# Patient Record
Sex: Female | Born: 1940 | Race: White | Hispanic: No | Smoking: Former smoker
Health system: Southern US, Community
[De-identification: ages and names within clinical notes are randomized; demographics above are authoritative.]

## PROBLEM LIST (undated history)

## (undated) DIAGNOSIS — T50905A Adverse effect of unspecified drugs, medicaments and biological substances, initial encounter: Secondary | ICD-10-CM

## (undated) DIAGNOSIS — C569 Malignant neoplasm of unspecified ovary: Principal | ICD-10-CM

## (undated) DIAGNOSIS — I158 Other secondary hypertension: Secondary | ICD-10-CM

## (undated) DIAGNOSIS — C801 Malignant (primary) neoplasm, unspecified: Secondary | ICD-10-CM

## (undated) DIAGNOSIS — B029 Zoster without complications: Secondary | ICD-10-CM

## (undated) HISTORY — PX: GANGLION CYST EXCISION: SHX1691

## (undated) HISTORY — DX: Malignant (primary) neoplasm, unspecified: C80.1

## (undated) HISTORY — DX: Other secondary hypertension: I15.8

## (undated) HISTORY — DX: Malignant neoplasm of unspecified ovary: C56.9

## (undated) HISTORY — DX: Adverse effect of unspecified drugs, medicaments and biological substances, initial encounter: T50.905A

## (undated) HISTORY — DX: Zoster without complications: B02.9

---

## 2001-06-01 ENCOUNTER — Encounter: Payer: Self-pay | Admitting: Family Medicine

## 2001-06-01 ENCOUNTER — Ambulatory Visit (HOSPITAL_COMMUNITY): Admission: RE | Admit: 2001-06-01 | Discharge: 2001-06-01 | Payer: Self-pay | Admitting: Family Medicine

## 2001-06-07 ENCOUNTER — Encounter: Payer: Self-pay | Admitting: Family Medicine

## 2001-06-07 ENCOUNTER — Ambulatory Visit (HOSPITAL_COMMUNITY): Admission: RE | Admit: 2001-06-07 | Discharge: 2001-06-07 | Payer: Self-pay | Admitting: Family Medicine

## 2001-06-14 ENCOUNTER — Ambulatory Visit: Admission: RE | Admit: 2001-06-14 | Discharge: 2001-06-14 | Payer: Self-pay | Admitting: Gynecology

## 2001-06-16 ENCOUNTER — Ambulatory Visit (HOSPITAL_COMMUNITY): Admission: RE | Admit: 2001-06-16 | Discharge: 2001-06-16 | Payer: Self-pay | Admitting: Obstetrics and Gynecology

## 2001-06-16 ENCOUNTER — Encounter: Payer: Self-pay | Admitting: Obstetrics and Gynecology

## 2001-06-16 ENCOUNTER — Encounter: Payer: Self-pay | Admitting: Gynecology

## 2001-06-20 ENCOUNTER — Inpatient Hospital Stay (HOSPITAL_COMMUNITY): Admission: RE | Admit: 2001-06-20 | Discharge: 2001-06-23 | Payer: Self-pay | Admitting: Obstetrics and Gynecology

## 2001-06-20 ENCOUNTER — Encounter (INDEPENDENT_AMBULATORY_CARE_PROVIDER_SITE_OTHER): Payer: Self-pay

## 2001-08-01 ENCOUNTER — Ambulatory Visit: Admission: RE | Admit: 2001-08-01 | Discharge: 2001-08-01 | Payer: Self-pay | Admitting: Gynecologic Oncology

## 2001-11-07 ENCOUNTER — Ambulatory Visit (HOSPITAL_COMMUNITY): Admission: RE | Admit: 2001-11-07 | Discharge: 2001-11-07 | Payer: Self-pay | Admitting: Oncology

## 2001-11-07 ENCOUNTER — Encounter: Payer: Self-pay | Admitting: Oncology

## 2001-11-09 ENCOUNTER — Ambulatory Visit (HOSPITAL_COMMUNITY): Admission: RE | Admit: 2001-11-09 | Discharge: 2001-11-09 | Payer: Self-pay | Admitting: Oncology

## 2001-11-10 ENCOUNTER — Encounter (HOSPITAL_COMMUNITY): Admission: RE | Admit: 2001-11-10 | Discharge: 2001-11-18 | Payer: Self-pay | Admitting: Oncology

## 2001-11-28 ENCOUNTER — Ambulatory Visit: Admission: RE | Admit: 2001-11-28 | Discharge: 2001-11-28 | Payer: Self-pay | Admitting: Gynecologic Oncology

## 2002-01-16 ENCOUNTER — Encounter: Payer: Self-pay | Admitting: *Deleted

## 2002-01-16 ENCOUNTER — Ambulatory Visit (HOSPITAL_COMMUNITY): Admission: RE | Admit: 2002-01-16 | Discharge: 2002-01-16 | Payer: Self-pay | Admitting: *Deleted

## 2002-02-06 ENCOUNTER — Ambulatory Visit: Admission: RE | Admit: 2002-02-06 | Discharge: 2002-02-06 | Payer: Self-pay | Admitting: Gynecologic Oncology

## 2002-05-08 ENCOUNTER — Ambulatory Visit (HOSPITAL_COMMUNITY): Admission: RE | Admit: 2002-05-08 | Discharge: 2002-05-08 | Payer: Self-pay | Admitting: *Deleted

## 2002-05-08 ENCOUNTER — Encounter: Payer: Self-pay | Admitting: *Deleted

## 2002-05-15 ENCOUNTER — Ambulatory Visit: Admission: RE | Admit: 2002-05-15 | Discharge: 2002-05-15 | Payer: Self-pay | Admitting: Gynecologic Oncology

## 2002-09-19 ENCOUNTER — Ambulatory Visit: Admission: RE | Admit: 2002-09-19 | Discharge: 2002-09-19 | Payer: Self-pay | Admitting: Gynecologic Oncology

## 2003-02-05 ENCOUNTER — Ambulatory Visit: Admission: RE | Admit: 2003-02-05 | Discharge: 2003-02-05 | Payer: Self-pay | Admitting: Gynecologic Oncology

## 2003-02-13 ENCOUNTER — Ambulatory Visit (HOSPITAL_COMMUNITY): Admission: RE | Admit: 2003-02-13 | Discharge: 2003-02-13 | Payer: Self-pay | Admitting: Gynecologic Oncology

## 2003-02-13 ENCOUNTER — Encounter: Payer: Self-pay | Admitting: Gynecologic Oncology

## 2003-04-09 ENCOUNTER — Ambulatory Visit: Admission: RE | Admit: 2003-04-09 | Discharge: 2003-04-09 | Payer: Self-pay | Admitting: Gynecologic Oncology

## 2003-05-23 ENCOUNTER — Ambulatory Visit (HOSPITAL_COMMUNITY): Admission: RE | Admit: 2003-05-23 | Discharge: 2003-05-23 | Payer: Self-pay | Admitting: Oncology

## 2003-05-23 ENCOUNTER — Encounter: Payer: Self-pay | Admitting: Oncology

## 2003-09-17 ENCOUNTER — Ambulatory Visit: Admission: RE | Admit: 2003-09-17 | Discharge: 2003-09-17 | Payer: Self-pay | Admitting: Gynecologic Oncology

## 2004-03-24 ENCOUNTER — Ambulatory Visit: Admission: RE | Admit: 2004-03-24 | Discharge: 2004-03-24 | Payer: Self-pay | Admitting: Gynecologic Oncology

## 2004-04-29 ENCOUNTER — Ambulatory Visit (HOSPITAL_COMMUNITY): Admission: RE | Admit: 2004-04-29 | Discharge: 2004-04-29 | Payer: Self-pay | Admitting: Oncology

## 2004-08-25 ENCOUNTER — Ambulatory Visit: Admission: RE | Admit: 2004-08-25 | Discharge: 2004-08-25 | Payer: Self-pay | Admitting: Gynecologic Oncology

## 2004-11-23 ENCOUNTER — Ambulatory Visit (HOSPITAL_COMMUNITY): Admission: RE | Admit: 2004-11-23 | Discharge: 2004-11-23 | Payer: Self-pay | Admitting: Family Medicine

## 2004-12-02 ENCOUNTER — Ambulatory Visit (HOSPITAL_COMMUNITY): Admission: RE | Admit: 2004-12-02 | Discharge: 2004-12-02 | Payer: Self-pay | Admitting: Oncology

## 2004-12-02 ENCOUNTER — Encounter: Payer: Self-pay | Admitting: Cardiovascular Disease

## 2004-12-18 ENCOUNTER — Ambulatory Visit: Payer: Self-pay | Admitting: Oncology

## 2004-12-23 ENCOUNTER — Encounter: Admission: RE | Admit: 2004-12-23 | Discharge: 2004-12-23 | Payer: Self-pay | Admitting: Family Medicine

## 2004-12-30 ENCOUNTER — Encounter: Admission: RE | Admit: 2004-12-30 | Discharge: 2004-12-30 | Payer: Self-pay | Admitting: Family Medicine

## 2005-02-17 ENCOUNTER — Ambulatory Visit: Payer: Self-pay | Admitting: Oncology

## 2005-04-05 ENCOUNTER — Ambulatory Visit: Payer: Self-pay | Admitting: Oncology

## 2005-05-21 ENCOUNTER — Ambulatory Visit: Payer: Self-pay | Admitting: Oncology

## 2005-10-18 ENCOUNTER — Ambulatory Visit: Payer: Self-pay | Admitting: Oncology

## 2006-02-09 ENCOUNTER — Ambulatory Visit: Admission: RE | Admit: 2006-02-09 | Discharge: 2006-02-09 | Payer: Self-pay | Admitting: Gynecologic Oncology

## 2006-04-21 ENCOUNTER — Ambulatory Visit: Payer: Self-pay | Admitting: Oncology

## 2006-04-25 LAB — CBC WITH DIFFERENTIAL/PLATELET
Basophils Absolute: 0 10*3/uL (ref 0.0–0.1)
EOS%: 0.4 % (ref 0.0–7.0)
HCT: 39.1 % (ref 34.8–46.6)
HGB: 13.4 g/dL (ref 11.6–15.9)
MCH: 31.5 pg (ref 26.0–34.0)
MCV: 92 fL (ref 81.0–101.0)
NEUT%: 58.5 % (ref 39.6–76.8)
lymph#: 1.8 10*3/uL (ref 0.9–3.3)

## 2006-04-25 LAB — COMPREHENSIVE METABOLIC PANEL
Albumin: 4.3 g/dL (ref 3.5–5.2)
Alkaline Phosphatase: 73 U/L (ref 39–117)
BUN: 13 mg/dL (ref 6–23)
CO2: 26 mEq/L (ref 19–32)
Calcium: 9.1 mg/dL (ref 8.4–10.5)
Glucose, Bld: 87 mg/dL (ref 70–99)
Potassium: 4 mEq/L (ref 3.5–5.3)

## 2006-05-10 ENCOUNTER — Ambulatory Visit (HOSPITAL_COMMUNITY): Admission: RE | Admit: 2006-05-10 | Discharge: 2006-05-10 | Payer: Self-pay | Admitting: Oncology

## 2006-05-17 ENCOUNTER — Ambulatory Visit: Admission: RE | Admit: 2006-05-17 | Discharge: 2006-05-17 | Payer: Self-pay | Admitting: Gynecologic Oncology

## 2006-05-27 LAB — COMPREHENSIVE METABOLIC PANEL
AST: 12 U/L (ref 0–37)
Albumin: 4.2 g/dL (ref 3.5–5.2)
BUN: 17 mg/dL (ref 6–23)
Calcium: 8.9 mg/dL (ref 8.4–10.5)
Chloride: 106 mEq/L (ref 96–112)
Glucose, Bld: 87 mg/dL (ref 70–99)
Potassium: 3.7 mEq/L (ref 3.5–5.3)
Sodium: 140 mEq/L (ref 135–145)
Total Protein: 6.6 g/dL (ref 6.0–8.3)

## 2006-05-27 LAB — CBC WITH DIFFERENTIAL/PLATELET
BASO%: 0.6 % (ref 0.0–2.0)
Basophils Absolute: 0 10*3/uL (ref 0.0–0.1)
EOS%: 0.4 % (ref 0.0–7.0)
HGB: 13.2 g/dL (ref 11.6–15.9)
MCH: 30.8 pg (ref 26.0–34.0)
MCHC: 33.1 g/dL (ref 32.0–36.0)
RBC: 4.29 10*6/uL (ref 3.70–5.32)
RDW: 12.6 % (ref 11.3–14.5)
lymph#: 1.9 10*3/uL (ref 0.9–3.3)

## 2006-06-03 LAB — COMPREHENSIVE METABOLIC PANEL
AST: 13 U/L (ref 0–37)
Albumin: 4.3 g/dL (ref 3.5–5.2)
Alkaline Phosphatase: 69 U/L (ref 39–117)
BUN: 17 mg/dL (ref 6–23)
Calcium: 9.3 mg/dL (ref 8.4–10.5)
Glucose, Bld: 126 mg/dL — ABNORMAL HIGH (ref 70–99)
Total Bilirubin: 0.4 mg/dL (ref 0.3–1.2)

## 2006-06-03 LAB — CA 125: CA 125: 89.8 U/mL — ABNORMAL HIGH (ref 0.0–30.2)

## 2006-06-03 LAB — CBC WITH DIFFERENTIAL/PLATELET
Basophils Absolute: 0.1 10*3/uL (ref 0.0–0.1)
Eosinophils Absolute: 0 10*3/uL (ref 0.0–0.5)
HGB: 13.3 g/dL (ref 11.6–15.9)
MONO#: 0.2 10*3/uL (ref 0.1–0.9)
NEUT#: 3 10*3/uL (ref 1.5–6.5)
RBC: 4.27 10*6/uL (ref 3.70–5.32)
RDW: 12.1 % (ref 11.3–14.5)
WBC: 5.3 10*3/uL (ref 3.9–10.0)
lymph#: 1.9 10*3/uL (ref 0.9–3.3)

## 2006-06-08 ENCOUNTER — Ambulatory Visit: Payer: Self-pay | Admitting: Oncology

## 2006-06-10 LAB — CBC WITH DIFFERENTIAL/PLATELET
BASO%: 1 % (ref 0.0–2.0)
EOS%: 0.6 % (ref 0.0–7.0)
MCH: 32.3 pg (ref 26.0–34.0)
MCHC: 34.8 g/dL (ref 32.0–36.0)
MCV: 92.7 fL (ref 81.0–101.0)
MONO%: 4.9 % (ref 0.0–13.0)
RDW: 12.7 % (ref 11.3–14.5)
lymph#: 1.5 10*3/uL (ref 0.9–3.3)

## 2006-06-10 LAB — COMPREHENSIVE METABOLIC PANEL
ALT: 11 U/L (ref 0–40)
AST: 14 U/L (ref 0–37)
Albumin: 4.3 g/dL (ref 3.5–5.2)
Alkaline Phosphatase: 73 U/L (ref 39–117)
Calcium: 8.6 mg/dL (ref 8.4–10.5)
Chloride: 108 mEq/L (ref 96–112)
Creatinine, Ser: 0.8 mg/dL (ref 0.40–1.20)
Potassium: 3.7 mEq/L (ref 3.5–5.3)

## 2006-06-17 LAB — CBC WITH DIFFERENTIAL/PLATELET
Basophils Absolute: 0 10*3/uL (ref 0.0–0.1)
HCT: 32.3 % — ABNORMAL LOW (ref 34.8–46.6)
HGB: 11.1 g/dL — ABNORMAL LOW (ref 11.6–15.9)
MONO#: 0.3 10*3/uL (ref 0.1–0.9)
NEUT%: 38.5 % — ABNORMAL LOW (ref 39.6–76.8)
WBC: 3.1 10*3/uL — ABNORMAL LOW (ref 3.9–10.0)
lymph#: 1.6 10*3/uL (ref 0.9–3.3)

## 2006-06-24 LAB — CBC WITH DIFFERENTIAL/PLATELET
BASO%: 0.9 % (ref 0.0–2.0)
EOS%: 0.3 % (ref 0.0–7.0)
HCT: 36.5 % (ref 34.8–46.6)
LYMPH%: 37.6 % (ref 14.0–48.0)
MCH: 31.2 pg (ref 26.0–34.0)
MCHC: 33.7 g/dL (ref 32.0–36.0)
MONO%: 9.3 % (ref 0.0–13.0)
NEUT%: 52 % (ref 39.6–76.8)
Platelets: 138 10*3/uL — ABNORMAL LOW (ref 145–400)

## 2006-06-24 LAB — COMPREHENSIVE METABOLIC PANEL
ALT: 15 U/L (ref 0–40)
AST: 13 U/L (ref 0–37)
Creatinine, Ser: 0.68 mg/dL (ref 0.40–1.20)
Total Bilirubin: 0.4 mg/dL (ref 0.3–1.2)

## 2006-06-24 LAB — CA 125: CA 125: 71.8 U/mL — ABNORMAL HIGH (ref 0.0–30.2)

## 2006-07-01 LAB — COMPREHENSIVE METABOLIC PANEL
AST: 11 U/L (ref 0–37)
BUN: 18 mg/dL (ref 6–23)
Calcium: 9 mg/dL (ref 8.4–10.5)
Chloride: 107 mEq/L (ref 96–112)
Creatinine, Ser: 0.66 mg/dL (ref 0.40–1.20)
Glucose, Bld: 98 mg/dL (ref 70–99)

## 2006-07-01 LAB — CBC WITH DIFFERENTIAL/PLATELET
Basophils Absolute: 0.1 10*3/uL (ref 0.0–0.1)
EOS%: 0.6 % (ref 0.0–7.0)
HCT: 34 % — ABNORMAL LOW (ref 34.8–46.6)
HGB: 12 g/dL (ref 11.6–15.9)
MCH: 32.6 pg (ref 26.0–34.0)
MCV: 92.1 fL (ref 81.0–101.0)
NEUT%: 62.5 % (ref 39.6–76.8)
lymph#: 1.5 10*3/uL (ref 0.9–3.3)

## 2006-07-01 LAB — CA 125: CA 125: 71.2 U/mL — ABNORMAL HIGH (ref 0.0–30.2)

## 2006-07-08 LAB — CBC WITH DIFFERENTIAL/PLATELET
BASO%: 1.5 % (ref 0.0–2.0)
EOS%: 0.5 % (ref 0.0–7.0)
HGB: 11 g/dL — ABNORMAL LOW (ref 11.6–15.9)
MCH: 32.3 pg (ref 26.0–34.0)
MCHC: 34.8 g/dL (ref 32.0–36.0)
RBC: 3.4 10*6/uL — ABNORMAL LOW (ref 3.70–5.32)
RDW: 14.5 % (ref 11.3–14.5)
lymph#: 1.2 10*3/uL (ref 0.9–3.3)

## 2006-07-08 LAB — COMPREHENSIVE METABOLIC PANEL
AST: 10 U/L (ref 0–37)
Albumin: 4 g/dL (ref 3.5–5.2)
Alkaline Phosphatase: 55 U/L (ref 39–117)
Potassium: 3.8 mEq/L (ref 3.5–5.3)
Sodium: 139 mEq/L (ref 135–145)
Total Bilirubin: 0.6 mg/dL (ref 0.3–1.2)
Total Protein: 6.2 g/dL (ref 6.0–8.3)

## 2006-07-08 LAB — CA 125: CA 125: 55.8 U/mL — ABNORMAL HIGH (ref 0.0–30.2)

## 2006-07-15 LAB — COMPREHENSIVE METABOLIC PANEL
ALT: 12 U/L (ref 0–40)
AST: 14 U/L (ref 0–37)
Creatinine, Ser: 0.86 mg/dL (ref 0.40–1.20)
Total Bilirubin: 0.5 mg/dL (ref 0.3–1.2)

## 2006-07-15 LAB — CBC WITH DIFFERENTIAL/PLATELET
Basophils Absolute: 0 10*3/uL (ref 0.0–0.1)
EOS%: 0.2 % (ref 0.0–7.0)
LYMPH%: 12.7 % — ABNORMAL LOW (ref 14.0–48.0)
MCH: 33.2 pg (ref 26.0–34.0)
MCV: 95.1 fL (ref 81.0–101.0)
MONO%: 4.6 % (ref 0.0–13.0)
Platelets: 81 10*3/uL — ABNORMAL LOW (ref 145–400)
RBC: 2.91 10*6/uL — ABNORMAL LOW (ref 3.70–5.32)
RDW: 18.9 % — ABNORMAL HIGH (ref 11.3–14.5)

## 2006-07-15 LAB — CA 125: CA 125: 32.3 U/mL — ABNORMAL HIGH (ref 0.0–30.2)

## 2006-07-22 ENCOUNTER — Ambulatory Visit: Payer: Self-pay | Admitting: Oncology

## 2006-07-22 LAB — CBC WITH DIFFERENTIAL/PLATELET
BASO%: 0.9 % (ref 0.0–2.0)
Basophils Absolute: 0.1 10*3/uL (ref 0.0–0.1)
EOS%: 0.1 % (ref 0.0–7.0)
MCH: 32.8 pg (ref 26.0–34.0)
MCHC: 33.1 g/dL (ref 32.0–36.0)
MCV: 99.1 fL (ref 81.0–101.0)
MONO%: 6.5 % (ref 0.0–13.0)
RBC: 3.21 10*6/uL — ABNORMAL LOW (ref 3.70–5.32)
RDW: 20.2 % — ABNORMAL HIGH (ref 11.3–14.5)

## 2006-07-29 LAB — COMPREHENSIVE METABOLIC PANEL
AST: 13 U/L (ref 0–37)
Albumin: 4.2 g/dL (ref 3.5–5.2)
Alkaline Phosphatase: 73 U/L (ref 39–117)
Glucose, Bld: 107 mg/dL — ABNORMAL HIGH (ref 70–99)
Potassium: 3.9 mEq/L (ref 3.5–5.3)
Sodium: 139 mEq/L (ref 135–145)
Total Protein: 6.7 g/dL (ref 6.0–8.3)

## 2006-07-29 LAB — CBC WITH DIFFERENTIAL/PLATELET
EOS%: 0.3 % (ref 0.0–7.0)
Eosinophils Absolute: 0 10*3/uL (ref 0.0–0.5)
MCV: 98.3 fL (ref 81.0–101.0)
MONO%: 6.1 % (ref 0.0–13.0)
NEUT#: 3 10*3/uL (ref 1.5–6.5)
RBC: 3.38 10*6/uL — ABNORMAL LOW (ref 3.70–5.32)
RDW: 18.1 % — ABNORMAL HIGH (ref 11.3–14.5)
WBC: 4.7 10*3/uL (ref 3.9–10.0)
lymph#: 1.4 10*3/uL (ref 0.9–3.3)

## 2006-08-05 LAB — COMPREHENSIVE METABOLIC PANEL
ALT: 15 U/L (ref 0–40)
AST: 13 U/L (ref 0–37)
CO2: 24 mEq/L (ref 19–32)
Creatinine, Ser: 0.69 mg/dL (ref 0.40–1.20)
Total Bilirubin: 0.5 mg/dL (ref 0.3–1.2)

## 2006-08-05 LAB — CBC WITH DIFFERENTIAL/PLATELET
BASO%: 0.8 % (ref 0.0–2.0)
EOS%: 0.4 % (ref 0.0–7.0)
HCT: 35.6 % (ref 34.8–46.6)
LYMPH%: 30 % (ref 14.0–48.0)
MCH: 33.3 pg (ref 26.0–34.0)
MCHC: 33.4 g/dL (ref 32.0–36.0)
MCV: 99.7 fL (ref 81.0–101.0)
MONO%: 7.6 % (ref 0.0–13.0)
NEUT%: 61.2 % (ref 39.6–76.8)
Platelets: 207 10*3/uL (ref 145–400)

## 2006-08-05 LAB — CA 125: CA 125: 34.4 U/mL — ABNORMAL HIGH (ref 0.0–30.2)

## 2006-08-12 LAB — CBC WITH DIFFERENTIAL/PLATELET
Basophils Absolute: 0.3 10*3/uL — ABNORMAL HIGH (ref 0.0–0.1)
EOS%: 0.2 % (ref 0.0–7.0)
HCT: 34.5 % — ABNORMAL LOW (ref 34.8–46.6)
HGB: 11.7 g/dL (ref 11.6–15.9)
MCH: 33.7 pg (ref 26.0–34.0)
NEUT%: 68.9 % (ref 39.6–76.8)
lymph#: 2.5 10*3/uL (ref 0.9–3.3)

## 2006-08-19 LAB — CA 125: CA 125: 31.8 U/mL — ABNORMAL HIGH (ref 0.0–30.2)

## 2006-08-19 LAB — CBC WITH DIFFERENTIAL/PLATELET
Basophils Absolute: 0.1 10*3/uL (ref 0.0–0.1)
EOS%: 0.4 % (ref 0.0–7.0)
HCT: 33.5 % — ABNORMAL LOW (ref 34.8–46.6)
HGB: 11.6 g/dL (ref 11.6–15.9)
LYMPH%: 21.2 % (ref 14.0–48.0)
MCH: 34.7 pg — ABNORMAL HIGH (ref 26.0–34.0)
MCV: 100 fL (ref 81.0–101.0)
MONO%: 6.3 % (ref 0.0–13.0)
NEUT%: 71.2 % (ref 39.6–76.8)
Platelets: 142 10*3/uL — ABNORMAL LOW (ref 145–400)
RDW: 17.1 % — ABNORMAL HIGH (ref 11.3–14.5)

## 2006-08-19 LAB — COMPREHENSIVE METABOLIC PANEL
CO2: 22 mEq/L (ref 19–32)
Glucose, Bld: 98 mg/dL (ref 70–99)
Sodium: 140 mEq/L (ref 135–145)
Total Bilirubin: 0.4 mg/dL (ref 0.3–1.2)
Total Protein: 6.8 g/dL (ref 6.0–8.3)

## 2006-09-07 ENCOUNTER — Ambulatory Visit: Payer: Self-pay | Admitting: Oncology

## 2006-09-09 ENCOUNTER — Ambulatory Visit: Admission: RE | Admit: 2006-09-09 | Discharge: 2006-09-09 | Payer: Self-pay | Admitting: Oncology

## 2006-09-09 ENCOUNTER — Encounter: Payer: Self-pay | Admitting: Vascular Surgery

## 2006-09-09 LAB — CBC WITH DIFFERENTIAL/PLATELET
BASO%: 0.7 % (ref 0.0–2.0)
HCT: 37.5 % (ref 34.8–46.6)
MCHC: 33.6 g/dL (ref 32.0–36.0)
MONO#: 0.3 10*3/uL (ref 0.1–0.9)
NEUT%: 67.3 % (ref 39.6–76.8)
RBC: 3.8 10*6/uL (ref 3.70–5.32)
WBC: 5.5 10*3/uL (ref 3.9–10.0)
lymph#: 1.4 10*3/uL (ref 0.9–3.3)

## 2006-09-15 LAB — CBC WITH DIFFERENTIAL/PLATELET
BASO%: 0.4 % (ref 0.0–2.0)
Eosinophils Absolute: 0 10*3/uL (ref 0.0–0.5)
HCT: 36.7 % (ref 34.8–46.6)
LYMPH%: 28.4 % (ref 14.0–48.0)
MCHC: 33.9 g/dL (ref 32.0–36.0)
MCV: 99.2 fL (ref 81.0–101.0)
MONO#: 0.2 10*3/uL (ref 0.1–0.9)
NEUT%: 66.9 % (ref 39.6–76.8)
Platelets: 137 10*3/uL — ABNORMAL LOW (ref 145–400)
WBC: 4.4 10*3/uL (ref 3.9–10.0)

## 2006-09-15 LAB — COMPREHENSIVE METABOLIC PANEL
ALT: 17 U/L (ref 0–40)
Albumin: 4.1 g/dL (ref 3.5–5.2)
CO2: 25 mEq/L (ref 19–32)
Calcium: 9.1 mg/dL (ref 8.4–10.5)
Chloride: 105 mEq/L (ref 96–112)
Potassium: 3.8 mEq/L (ref 3.5–5.3)
Sodium: 140 mEq/L (ref 135–145)
Total Protein: 6.7 g/dL (ref 6.0–8.3)

## 2006-09-15 LAB — CA 125: CA 125: 29.1 U/mL (ref 0.0–30.2)

## 2006-09-30 LAB — CBC WITH DIFFERENTIAL/PLATELET
Basophils Absolute: 0 10*3/uL (ref 0.0–0.1)
EOS%: 0.9 % (ref 0.0–7.0)
NEUT#: 1.9 10*3/uL (ref 1.5–6.5)
NEUT%: 49.3 % (ref 39.6–76.8)
Platelets: 163 10*3/uL (ref 145–400)
WBC: 3.9 10*3/uL (ref 3.9–10.0)
lymph#: 1.5 10*3/uL (ref 0.9–3.3)

## 2006-09-30 LAB — COMPREHENSIVE METABOLIC PANEL
ALT: 15 U/L (ref 0–40)
AST: 11 U/L (ref 0–37)
Albumin: 3.7 g/dL (ref 3.5–5.2)
Alkaline Phosphatase: 74 U/L (ref 39–117)
Calcium: 8.7 mg/dL (ref 8.4–10.5)
Chloride: 105 mEq/L (ref 96–112)
Potassium: 3.4 mEq/L — ABNORMAL LOW (ref 3.5–5.3)
Sodium: 144 mEq/L (ref 135–145)
Total Protein: 6.3 g/dL (ref 6.0–8.3)

## 2006-10-21 LAB — COMPREHENSIVE METABOLIC PANEL
AST: 12 U/L (ref 0–37)
Albumin: 4.2 g/dL (ref 3.5–5.2)
Alkaline Phosphatase: 75 U/L (ref 39–117)
Potassium: 4.1 mEq/L (ref 3.5–5.3)
Sodium: 139 mEq/L (ref 135–145)
Total Bilirubin: 0.4 mg/dL (ref 0.3–1.2)
Total Protein: 6.1 g/dL (ref 6.0–8.3)

## 2006-10-21 LAB — CBC WITH DIFFERENTIAL/PLATELET
Eosinophils Absolute: 0 10*3/uL (ref 0.0–0.5)
LYMPH%: 30.7 % (ref 14.0–48.0)
MONO#: 0.3 10*3/uL (ref 0.1–0.9)
NEUT#: 3.5 10*3/uL (ref 1.5–6.5)
Platelets: 126 10*3/uL — ABNORMAL LOW (ref 145–400)
RBC: 3.25 10*6/uL — ABNORMAL LOW (ref 3.70–5.32)
RDW: 14.4 % (ref 11.3–14.5)
WBC: 5.7 10*3/uL (ref 3.9–10.0)

## 2006-10-26 ENCOUNTER — Ambulatory Visit: Payer: Self-pay | Admitting: Oncology

## 2006-10-28 LAB — CBC WITH DIFFERENTIAL/PLATELET
Basophils Absolute: 0.1 10*3/uL (ref 0.0–0.1)
Eosinophils Absolute: 0 10*3/uL (ref 0.0–0.5)
HGB: 10.7 g/dL — ABNORMAL LOW (ref 11.6–15.9)
MCV: 93.3 fL (ref 81.0–101.0)
MONO#: 0.4 10*3/uL (ref 0.1–0.9)
NEUT#: 2 10*3/uL (ref 1.5–6.5)
RBC: 3.27 10*6/uL — ABNORMAL LOW (ref 3.70–5.32)
RDW: 15.1 % — ABNORMAL HIGH (ref 11.3–14.5)
WBC: 4 10*3/uL (ref 3.9–10.0)

## 2006-10-28 LAB — COMPREHENSIVE METABOLIC PANEL
AST: 12 U/L (ref 0–37)
Albumin: 4.1 g/dL (ref 3.5–5.2)
Alkaline Phosphatase: 76 U/L (ref 39–117)
BUN: 17 mg/dL (ref 6–23)
Calcium: 8.8 mg/dL (ref 8.4–10.5)
Chloride: 106 mEq/L (ref 96–112)
Creatinine, Ser: 0.65 mg/dL (ref 0.40–1.20)
Glucose, Bld: 109 mg/dL — ABNORMAL HIGH (ref 70–99)
Potassium: 3.7 mEq/L (ref 3.5–5.3)

## 2006-11-04 LAB — COMPREHENSIVE METABOLIC PANEL
ALT: 15 U/L (ref 0–35)
AST: 14 U/L (ref 0–37)
Alkaline Phosphatase: 115 U/L (ref 39–117)
Calcium: 9.1 mg/dL (ref 8.4–10.5)
Chloride: 105 mEq/L (ref 96–112)
Creatinine, Ser: 0.73 mg/dL (ref 0.40–1.20)
Potassium: 3.7 mEq/L (ref 3.5–5.3)

## 2006-11-04 LAB — CBC WITH DIFFERENTIAL/PLATELET
BASO%: 0.1 % (ref 0.0–2.0)
EOS%: 0.1 % (ref 0.0–7.0)
MCH: 33.3 pg (ref 26.0–34.0)
MCHC: 34.1 g/dL (ref 32.0–36.0)
MONO%: 6.7 % (ref 0.0–13.0)
NEUT%: 82.3 % — ABNORMAL HIGH (ref 39.6–76.8)
RDW: 19 % — ABNORMAL HIGH (ref 11.3–14.5)
lymph#: 1.5 10*3/uL (ref 0.9–3.3)

## 2006-11-11 LAB — CBC WITH DIFFERENTIAL/PLATELET
Basophils Absolute: 0 10*3/uL (ref 0.0–0.1)
EOS%: 0.2 % (ref 0.0–7.0)
Eosinophils Absolute: 0 10*3/uL (ref 0.0–0.5)
HGB: 11.4 g/dL — ABNORMAL LOW (ref 11.6–15.9)
LYMPH%: 16.4 % (ref 14.0–48.0)
MCH: 32.3 pg (ref 26.0–34.0)
MCV: 97.5 fL (ref 81.0–101.0)
MONO%: 6.4 % (ref 0.0–13.0)
NEUT%: 76.7 % (ref 39.6–76.8)
Platelets: 74 10*3/uL — ABNORMAL LOW (ref 145–400)
RDW: 18.6 % — ABNORMAL HIGH (ref 11.3–14.5)

## 2006-11-18 LAB — CBC WITH DIFFERENTIAL/PLATELET
EOS%: 0.4 % (ref 0.0–7.0)
LYMPH%: 28.8 % (ref 14.0–48.0)
MCH: 33.7 pg (ref 26.0–34.0)
MCHC: 34.5 g/dL (ref 32.0–36.0)
MCV: 97.7 fL (ref 81.0–101.0)
MONO%: 10.3 % (ref 0.0–13.0)
NEUT#: 3.4 10*3/uL (ref 1.5–6.5)
Platelets: 82 10*3/uL — ABNORMAL LOW (ref 145–400)
RBC: 3.61 10*6/uL — ABNORMAL LOW (ref 3.70–5.32)
RDW: 17 % — ABNORMAL HIGH (ref 11.3–14.5)

## 2006-12-02 ENCOUNTER — Ambulatory Visit (HOSPITAL_COMMUNITY): Admission: RE | Admit: 2006-12-02 | Discharge: 2006-12-02 | Payer: Self-pay | Admitting: Oncology

## 2006-12-05 LAB — CBC WITH DIFFERENTIAL/PLATELET
Basophils Absolute: 0 10*3/uL (ref 0.0–0.1)
Eosinophils Absolute: 0 10*3/uL (ref 0.0–0.5)
HCT: 36.7 % (ref 34.8–46.6)
HGB: 12.4 g/dL (ref 11.6–15.9)
LYMPH%: 21.3 % (ref 14.0–48.0)
MCV: 99.7 fL (ref 81.0–101.0)
MONO%: 3.7 % (ref 0.0–13.0)
NEUT#: 4.3 10*3/uL (ref 1.5–6.5)
NEUT%: 74.6 % (ref 39.6–76.8)
Platelets: 149 10*3/uL (ref 145–400)

## 2006-12-05 LAB — COMPREHENSIVE METABOLIC PANEL
Albumin: 4.6 g/dL (ref 3.5–5.2)
Alkaline Phosphatase: 78 U/L (ref 39–117)
BUN: 17 mg/dL (ref 6–23)
Creatinine, Ser: 0.81 mg/dL (ref 0.40–1.20)
Glucose, Bld: 155 mg/dL — ABNORMAL HIGH (ref 70–99)
Potassium: 3.5 mEq/L (ref 3.5–5.3)
Total Bilirubin: 0.9 mg/dL (ref 0.3–1.2)

## 2007-03-21 ENCOUNTER — Ambulatory Visit: Payer: Self-pay | Admitting: Oncology

## 2007-03-24 LAB — CBC WITH DIFFERENTIAL/PLATELET
Basophils Absolute: 0 10*3/uL (ref 0.0–0.1)
Eosinophils Absolute: 0 10*3/uL (ref 0.0–0.5)
HGB: 13 g/dL (ref 11.6–15.9)
MCV: 91.8 fL (ref 81.0–101.0)
MONO#: 0.3 10*3/uL (ref 0.1–0.9)
NEUT#: 3.6 10*3/uL (ref 1.5–6.5)
RDW: 14.9 % — ABNORMAL HIGH (ref 11.3–14.5)
WBC: 5.7 10*3/uL (ref 3.9–10.0)
lymph#: 1.7 10*3/uL (ref 0.9–3.3)

## 2007-03-24 LAB — CA 125: CA 125: 34.4 U/mL — ABNORMAL HIGH (ref 0.0–30.2)

## 2007-03-24 LAB — COMPREHENSIVE METABOLIC PANEL
Alkaline Phosphatase: 76 U/L (ref 39–117)
BUN: 21 mg/dL (ref 6–23)
Glucose, Bld: 122 mg/dL — ABNORMAL HIGH (ref 70–99)
Total Bilirubin: 0.6 mg/dL (ref 0.3–1.2)

## 2007-04-24 LAB — CBC WITH DIFFERENTIAL/PLATELET
Basophils Absolute: 0 10*3/uL (ref 0.0–0.1)
Eosinophils Absolute: 0 10*3/uL (ref 0.0–0.5)
HCT: 37.5 % (ref 34.8–46.6)
HGB: 13.1 g/dL (ref 11.6–15.9)
LYMPH%: 26.3 % (ref 14.0–48.0)
MCV: 92.8 fL (ref 81.0–101.0)
MONO%: 6.8 % (ref 0.0–13.0)
NEUT#: 4 10*3/uL (ref 1.5–6.5)
Platelets: 153 10*3/uL (ref 145–400)

## 2007-04-24 LAB — COMPREHENSIVE METABOLIC PANEL
Albumin: 4.6 g/dL (ref 3.5–5.2)
Alkaline Phosphatase: 71 U/L (ref 39–117)
BUN: 19 mg/dL (ref 6–23)
Glucose, Bld: 122 mg/dL — ABNORMAL HIGH (ref 70–99)
Potassium: 3.9 mEq/L (ref 3.5–5.3)

## 2007-05-25 ENCOUNTER — Ambulatory Visit: Payer: Self-pay | Admitting: Oncology

## 2007-05-29 LAB — CBC WITH DIFFERENTIAL/PLATELET
Eosinophils Absolute: 0.1 10*3/uL (ref 0.0–0.5)
LYMPH%: 34.4 % (ref 14.0–48.0)
MONO#: 0.5 10*3/uL (ref 0.1–0.9)
NEUT#: 3.3 10*3/uL (ref 1.5–6.5)
Platelets: 158 10*3/uL (ref 145–400)
RBC: 4.23 10*6/uL (ref 3.70–5.32)
RDW: 14.3 % (ref 11.3–14.5)
WBC: 5.9 10*3/uL (ref 3.9–10.0)
lymph#: 2 10*3/uL (ref 0.9–3.3)

## 2007-05-29 LAB — COMPREHENSIVE METABOLIC PANEL
Alkaline Phosphatase: 70 U/L (ref 39–117)
BUN: 23 mg/dL (ref 6–23)
CO2: 22 mEq/L (ref 19–32)
Glucose, Bld: 97 mg/dL (ref 70–99)
Sodium: 142 mEq/L (ref 135–145)
Total Bilirubin: 0.5 mg/dL (ref 0.3–1.2)
Total Protein: 7.4 g/dL (ref 6.0–8.3)

## 2007-05-29 LAB — CA 125: CA 125: 44.5 U/mL — ABNORMAL HIGH (ref 0.0–30.2)

## 2007-07-14 ENCOUNTER — Ambulatory Visit: Payer: Self-pay | Admitting: Oncology

## 2007-07-21 LAB — CBC WITH DIFFERENTIAL/PLATELET
BASO%: 0.4 % (ref 0.0–2.0)
Eosinophils Absolute: 0.1 10*3/uL (ref 0.0–0.5)
HCT: 40.5 % (ref 34.8–46.6)
LYMPH%: 27.8 % (ref 14.0–48.0)
MCHC: 34.7 g/dL (ref 32.0–36.0)
MONO#: 0.3 10*3/uL (ref 0.1–0.9)
NEUT#: 3.3 10*3/uL (ref 1.5–6.5)
Platelets: 183 10*3/uL (ref 145–400)
RBC: 4.42 10*6/uL (ref 3.70–5.32)
WBC: 5.1 10*3/uL (ref 3.9–10.0)
lymph#: 1.4 10*3/uL (ref 0.9–3.3)

## 2007-07-21 LAB — COMPREHENSIVE METABOLIC PANEL
ALT: 11 U/L (ref 0–35)
Albumin: 4.6 g/dL (ref 3.5–5.2)
CO2: 24 mEq/L (ref 19–32)
Calcium: 9.4 mg/dL (ref 8.4–10.5)
Chloride: 105 mEq/L (ref 96–112)
Glucose, Bld: 143 mg/dL — ABNORMAL HIGH (ref 70–99)
Sodium: 141 mEq/L (ref 135–145)
Total Protein: 7.2 g/dL (ref 6.0–8.3)

## 2007-07-21 LAB — CA 125: CA 125: 64.9 U/mL — ABNORMAL HIGH (ref 0.0–30.2)

## 2007-08-23 ENCOUNTER — Ambulatory Visit: Payer: Self-pay | Admitting: Oncology

## 2007-08-29 LAB — CBC WITH DIFFERENTIAL/PLATELET
BASO%: 0.5 % (ref 0.0–2.0)
EOS%: 1.8 % (ref 0.0–7.0)
MCH: 32.3 pg (ref 26.0–34.0)
MCHC: 35.4 g/dL (ref 32.0–36.0)
MONO#: 0.4 10*3/uL (ref 0.1–0.9)
NEUT%: 57.3 % (ref 39.6–76.8)
RBC: 3.99 10*6/uL (ref 3.70–5.32)
RDW: 14.5 % (ref 11.3–14.5)
WBC: 4.8 10*3/uL (ref 3.9–10.0)
lymph#: 1.5 10*3/uL (ref 0.9–3.3)

## 2007-08-29 LAB — COMPREHENSIVE METABOLIC PANEL
ALT: 27 U/L (ref 0–35)
Albumin: 4.2 g/dL (ref 3.5–5.2)
Alkaline Phosphatase: 73 U/L (ref 39–117)
Glucose, Bld: 114 mg/dL — ABNORMAL HIGH (ref 70–99)
Potassium: 3.6 mEq/L (ref 3.5–5.3)
Sodium: 141 mEq/L (ref 135–145)
Total Bilirubin: 0.5 mg/dL (ref 0.3–1.2)
Total Protein: 6.9 g/dL (ref 6.0–8.3)

## 2007-11-14 ENCOUNTER — Ambulatory Visit: Payer: Self-pay | Admitting: Oncology

## 2007-11-21 LAB — CBC WITH DIFFERENTIAL/PLATELET
BASO%: 0.6 % (ref 0.0–2.0)
EOS%: 0.9 % (ref 0.0–7.0)
HCT: 39.7 % (ref 34.8–46.6)
LYMPH%: 40.6 % (ref 14.0–48.0)
MCH: 32.1 pg (ref 26.0–34.0)
MCHC: 35 g/dL (ref 32.0–36.0)
MCV: 91.8 fL (ref 81.0–101.0)
MONO%: 6.5 % (ref 0.0–13.0)
NEUT%: 51.4 % (ref 39.6–76.8)
Platelets: 186 10*3/uL (ref 145–400)
RBC: 4.32 10*6/uL (ref 3.70–5.32)
WBC: 5.9 10*3/uL (ref 3.9–10.0)

## 2007-11-21 LAB — COMPREHENSIVE METABOLIC PANEL
ALT: 10 U/L (ref 0–35)
Alkaline Phosphatase: 72 U/L (ref 39–117)
CO2: 27 mEq/L (ref 19–32)
Creatinine, Ser: 0.77 mg/dL (ref 0.40–1.20)
Sodium: 141 mEq/L (ref 135–145)
Total Bilirubin: 0.5 mg/dL (ref 0.3–1.2)
Total Protein: 7.7 g/dL (ref 6.0–8.3)

## 2007-11-21 LAB — CA 125: CA 125: 110.2 U/mL — ABNORMAL HIGH (ref 0.0–30.2)

## 2007-12-20 ENCOUNTER — Ambulatory Visit: Payer: Self-pay | Admitting: Oncology

## 2007-12-25 ENCOUNTER — Ambulatory Visit (HOSPITAL_COMMUNITY): Admission: RE | Admit: 2007-12-25 | Discharge: 2007-12-25 | Payer: Self-pay | Admitting: Oncology

## 2007-12-25 LAB — CBC WITH DIFFERENTIAL/PLATELET
Basophils Absolute: 0 10*3/uL (ref 0.0–0.1)
EOS%: 0.1 % (ref 0.0–7.0)
Eosinophils Absolute: 0 10*3/uL (ref 0.0–0.5)
HGB: 13.8 g/dL (ref 11.6–15.9)
MCH: 31.2 pg (ref 26.0–34.0)
MONO#: 0.4 10*3/uL (ref 0.1–0.9)
NEUT#: 4.4 10*3/uL (ref 1.5–6.5)
RDW: 14.3 % (ref 11.3–14.5)
WBC: 6.8 10*3/uL (ref 3.9–10.0)
lymph#: 1.9 10*3/uL (ref 0.9–3.3)

## 2007-12-25 LAB — COMPREHENSIVE METABOLIC PANEL
AST: 15 U/L (ref 0–37)
Albumin: 4.6 g/dL (ref 3.5–5.2)
BUN: 16 mg/dL (ref 6–23)
Calcium: 9.5 mg/dL (ref 8.4–10.5)
Chloride: 97 mEq/L (ref 96–112)
Potassium: 3.8 mEq/L (ref 3.5–5.3)

## 2008-01-29 ENCOUNTER — Ambulatory Visit: Payer: Self-pay | Admitting: Oncology

## 2008-01-31 LAB — CBC WITH DIFFERENTIAL/PLATELET
Basophils Absolute: 0 10*3/uL (ref 0.0–0.1)
Eosinophils Absolute: 0 10*3/uL (ref 0.0–0.5)
HGB: 12.7 g/dL (ref 11.6–15.9)
MCV: 90.6 fL (ref 81.0–101.0)
MONO%: 6.2 % (ref 0.0–13.0)
NEUT#: 2.6 10*3/uL (ref 1.5–6.5)
RBC: 4.04 10*6/uL (ref 3.70–5.32)
RDW: 15.1 % — ABNORMAL HIGH (ref 11.3–14.5)
WBC: 4.8 10*3/uL (ref 3.9–10.0)
lymph#: 1.8 10*3/uL (ref 0.9–3.3)

## 2008-01-31 LAB — COMPREHENSIVE METABOLIC PANEL
ALT: 9 U/L (ref 0–35)
AST: 13 U/L (ref 0–37)
Alkaline Phosphatase: 74 U/L (ref 39–117)
BUN: 21 mg/dL (ref 6–23)
Creatinine, Ser: 0.78 mg/dL (ref 0.40–1.20)

## 2008-03-18 ENCOUNTER — Ambulatory Visit: Payer: Self-pay | Admitting: Oncology

## 2008-03-21 LAB — COMPREHENSIVE METABOLIC PANEL
Albumin: 4.7 g/dL (ref 3.5–5.2)
BUN: 22 mg/dL (ref 6–23)
CO2: 28 mEq/L (ref 19–32)
Calcium: 9.9 mg/dL (ref 8.4–10.5)
Chloride: 104 mEq/L (ref 96–112)
Creatinine, Ser: 0.85 mg/dL (ref 0.40–1.20)
Glucose, Bld: 81 mg/dL (ref 70–99)
Potassium: 4.1 mEq/L (ref 3.5–5.3)

## 2008-03-21 LAB — CBC WITH DIFFERENTIAL/PLATELET
Basophils Absolute: 0 10*3/uL (ref 0.0–0.1)
EOS%: 0.8 % (ref 0.0–7.0)
Eosinophils Absolute: 0 10*3/uL (ref 0.0–0.5)
HCT: 38.8 % (ref 34.8–46.6)
HGB: 13.4 g/dL (ref 11.6–15.9)
MCH: 31.5 pg (ref 26.0–34.0)
MONO#: 0.3 10*3/uL (ref 0.1–0.9)
NEUT#: 3.5 10*3/uL (ref 1.5–6.5)
NEUT%: 57.4 % (ref 39.6–76.8)
RDW: 15.3 % — ABNORMAL HIGH (ref 11.3–14.5)
lymph#: 2.2 10*3/uL (ref 0.9–3.3)

## 2008-03-21 LAB — CA 125: CA 125: 139.8 U/mL — ABNORMAL HIGH (ref 0.0–30.2)

## 2008-04-29 LAB — COMPREHENSIVE METABOLIC PANEL
ALT: 13 U/L (ref 0–35)
AST: 15 U/L (ref 0–37)
Albumin: 4.7 g/dL (ref 3.5–5.2)
Alkaline Phosphatase: 69 U/L (ref 39–117)
BUN: 21 mg/dL (ref 6–23)
CO2: 24 mEq/L (ref 19–32)
Calcium: 9.9 mg/dL (ref 8.4–10.5)
Chloride: 106 mEq/L (ref 96–112)
Creatinine, Ser: 0.88 mg/dL (ref 0.40–1.20)
Glucose, Bld: 97 mg/dL (ref 70–99)
Potassium: 4.4 mEq/L (ref 3.5–5.3)
Sodium: 141 mEq/L (ref 135–145)
Total Bilirubin: 0.4 mg/dL (ref 0.3–1.2)
Total Protein: 7.7 g/dL (ref 6.0–8.3)

## 2008-04-29 LAB — CBC WITH DIFFERENTIAL/PLATELET
Eosinophils Absolute: 0 10*3/uL (ref 0.0–0.5)
HCT: 42 % (ref 34.8–46.6)
LYMPH%: 37.3 % (ref 14.0–48.0)
MCV: 91.7 fL (ref 81.0–101.0)
MONO#: 0.5 10*3/uL (ref 0.1–0.9)
NEUT#: 3.4 10*3/uL (ref 1.5–6.5)
NEUT%: 54.3 % (ref 39.6–76.8)
Platelets: 178 10*3/uL (ref 145–400)
WBC: 6.3 10*3/uL (ref 3.9–10.0)

## 2008-04-29 LAB — CANCER ANTIGEN 27.29: CA 27.29: 27 U/mL (ref 0–39)

## 2008-05-06 ENCOUNTER — Ambulatory Visit: Payer: Self-pay | Admitting: Oncology

## 2008-07-26 ENCOUNTER — Ambulatory Visit: Payer: Self-pay | Admitting: Oncology

## 2008-07-29 LAB — COMPREHENSIVE METABOLIC PANEL
AST: 14 U/L (ref 0–37)
Alkaline Phosphatase: 60 U/L (ref 39–117)
BUN: 20 mg/dL (ref 6–23)
Glucose, Bld: 95 mg/dL (ref 70–99)
Sodium: 140 mEq/L (ref 135–145)
Total Bilirubin: 0.7 mg/dL (ref 0.3–1.2)
Total Protein: 7.1 g/dL (ref 6.0–8.3)

## 2008-07-29 LAB — CBC WITH DIFFERENTIAL/PLATELET
EOS%: 0.4 % (ref 0.0–7.0)
Eosinophils Absolute: 0 10*3/uL (ref 0.0–0.5)
LYMPH%: 28.8 % (ref 14.0–48.0)
MCH: 31.9 pg (ref 26.0–34.0)
MCV: 93.7 fL (ref 81.0–101.0)
MONO%: 5.2 % (ref 0.0–13.0)
NEUT#: 3.4 10*3/uL (ref 1.5–6.5)
Platelets: 159 10*3/uL (ref 145–400)
RBC: 4.26 10*6/uL (ref 3.70–5.32)

## 2008-10-25 ENCOUNTER — Ambulatory Visit: Payer: Self-pay | Admitting: Oncology

## 2008-10-31 LAB — CBC WITH DIFFERENTIAL/PLATELET
EOS%: 0.6 % (ref 0.0–7.0)
LYMPH%: 28.4 % (ref 14.0–48.0)
MCH: 31.8 pg (ref 26.0–34.0)
MCHC: 34.2 g/dL (ref 32.0–36.0)
MCV: 93.1 fL (ref 81.0–101.0)
MONO%: 5.4 % (ref 0.0–13.0)
RBC: 4.33 10*6/uL (ref 3.70–5.32)
RDW: 13.9 % (ref 11.3–14.5)

## 2008-11-01 LAB — COMPREHENSIVE METABOLIC PANEL
AST: 17 U/L (ref 0–37)
Albumin: 4.5 g/dL (ref 3.5–5.2)
Alkaline Phosphatase: 68 U/L (ref 39–117)
Potassium: 4.3 mEq/L (ref 3.5–5.3)
Sodium: 141 mEq/L (ref 135–145)
Total Bilirubin: 0.6 mg/dL (ref 0.3–1.2)
Total Protein: 7.3 g/dL (ref 6.0–8.3)

## 2008-11-22 ENCOUNTER — Ambulatory Visit (HOSPITAL_COMMUNITY): Admission: RE | Admit: 2008-11-22 | Discharge: 2008-11-22 | Payer: Self-pay | Admitting: Oncology

## 2008-12-18 ENCOUNTER — Ambulatory Visit: Payer: Self-pay | Admitting: Oncology

## 2008-12-23 LAB — CBC WITH DIFFERENTIAL/PLATELET
BASO%: 0.4 % (ref 0.0–2.0)
EOS%: 1.2 % (ref 0.0–7.0)
HGB: 13.8 g/dL (ref 11.6–15.9)
MCH: 31.5 pg (ref 26.0–34.0)
MCHC: 34.3 g/dL (ref 32.0–36.0)
MCV: 91.7 fL (ref 81.0–101.0)
MONO%: 4.3 % (ref 0.0–13.0)
RBC: 4.4 10*6/uL (ref 3.70–5.32)
RDW: 14.4 % (ref 11.3–14.5)
lymph#: 1.7 10*3/uL (ref 0.9–3.3)

## 2008-12-23 LAB — COMPREHENSIVE METABOLIC PANEL
ALT: 18 U/L (ref 0–35)
AST: 20 U/L (ref 0–37)
Albumin: 4 g/dL (ref 3.5–5.2)
Alkaline Phosphatase: 63 U/L (ref 39–117)
BUN: 14 mg/dL (ref 6–23)
Calcium: 9.2 mg/dL (ref 8.4–10.5)
Chloride: 104 mEq/L (ref 96–112)
Potassium: 3.7 mEq/L (ref 3.5–5.3)
Sodium: 137 mEq/L (ref 135–145)
Total Protein: 7.1 g/dL (ref 6.0–8.3)

## 2008-12-31 ENCOUNTER — Ambulatory Visit (HOSPITAL_COMMUNITY): Admission: RE | Admit: 2008-12-31 | Discharge: 2008-12-31 | Payer: Self-pay | Admitting: Oncology

## 2009-01-14 LAB — CBC WITH DIFFERENTIAL/PLATELET
EOS%: 0.1 % (ref 0.0–7.0)
Eosinophils Absolute: 0 10*3/uL (ref 0.0–0.5)
LYMPH%: 37.3 % (ref 14.0–48.0)
MCH: 31.5 pg (ref 26.0–34.0)
MCV: 91.1 fL (ref 81.0–101.0)
MONO%: 16.9 % — ABNORMAL HIGH (ref 0.0–13.0)
NEUT#: 1.9 10*3/uL (ref 1.5–6.5)
Platelets: 192 10*3/uL (ref 145–400)
RBC: 3.74 10*6/uL (ref 3.70–5.32)

## 2009-01-15 LAB — CBC WITH DIFFERENTIAL/PLATELET
BASO%: 0.4 % (ref 0.0–2.0)
Basophils Absolute: 0 10*3/uL (ref 0.0–0.1)
EOS%: 0.2 % (ref 0.0–7.0)
Eosinophils Absolute: 0 10*3/uL (ref 0.0–0.5)
HCT: 32 % — ABNORMAL LOW (ref 34.8–46.6)
HGB: 11 g/dL — ABNORMAL LOW (ref 11.6–15.9)
LYMPH%: 39.8 % (ref 14.0–48.0)
MCH: 31.4 pg (ref 26.0–34.0)
MCHC: 34.4 g/dL (ref 32.0–36.0)
MCV: 91.4 fL (ref 81.0–101.0)
MONO#: 0.3 10*3/uL (ref 0.1–0.9)
MONO%: 7.5 % (ref 0.0–13.0)
NEUT#: 2.2 10*3/uL (ref 1.5–6.5)
NEUT%: 52.1 % (ref 39.6–76.8)
Platelets: 206 10*3/uL (ref 145–400)
RBC: 3.5 10*6/uL — ABNORMAL LOW (ref 3.70–5.32)
RDW: 14.5 % (ref 11.3–14.5)
WBC: 4.3 10*3/uL (ref 3.9–10.0)
lymph#: 1.7 10*3/uL (ref 0.9–3.3)

## 2009-01-16 LAB — CBC WITH DIFFERENTIAL/PLATELET
Basophils Absolute: 0 10*3/uL (ref 0.0–0.1)
EOS%: 0.2 % (ref 0.0–7.0)
HCT: 33.3 % — ABNORMAL LOW (ref 34.8–46.6)
HGB: 11.6 g/dL (ref 11.6–15.9)
MCH: 31.7 pg (ref 26.0–34.0)
NEUT%: 55.1 % (ref 39.6–76.8)
lymph#: 2.3 10*3/uL (ref 0.9–3.3)

## 2009-01-17 LAB — CBC WITH DIFFERENTIAL/PLATELET
Basophils Absolute: 0 10*3/uL (ref 0.0–0.1)
EOS%: 0.1 % (ref 0.0–7.0)
HCT: 34.2 % — ABNORMAL LOW (ref 34.8–46.6)
HGB: 11.8 g/dL (ref 11.6–15.9)
LYMPH%: 36.7 % (ref 14.0–48.0)
MCH: 31.8 pg (ref 26.0–34.0)
MCV: 92 fL (ref 81.0–101.0)
MONO%: 2.6 % (ref 0.0–13.0)
NEUT%: 60 % (ref 39.6–76.8)
Platelets: 264 10*3/uL (ref 145–400)
RDW: 14.5 % (ref 11.3–14.5)

## 2009-01-27 LAB — COMPREHENSIVE METABOLIC PANEL
ALT: 21 U/L (ref 0–35)
AST: 13 U/L (ref 0–37)
Albumin: 4.2 g/dL (ref 3.5–5.2)
Alkaline Phosphatase: 77 U/L (ref 39–117)
Calcium: 9.1 mg/dL (ref 8.4–10.5)
Chloride: 109 mEq/L (ref 96–112)
Potassium: 3.8 mEq/L (ref 3.5–5.3)
Sodium: 143 mEq/L (ref 135–145)
Total Protein: 6.4 g/dL (ref 6.0–8.3)

## 2009-01-27 LAB — CBC WITH DIFFERENTIAL/PLATELET
BASO%: 0.6 % (ref 0.0–2.0)
EOS%: 0.1 % (ref 0.0–7.0)
HGB: 9.9 g/dL — ABNORMAL LOW (ref 11.6–15.9)
MCH: 32 pg (ref 26.0–34.0)
MCV: 91.9 fL (ref 81.0–101.0)
MONO%: 5.7 % (ref 0.0–13.0)
NEUT#: 4.2 10*3/uL (ref 1.5–6.5)
RBC: 3.11 10*6/uL — ABNORMAL LOW (ref 3.70–5.32)
RDW: 15.1 % — ABNORMAL HIGH (ref 11.3–14.5)
lymph#: 1.8 10*3/uL (ref 0.9–3.3)

## 2009-01-27 LAB — CA 125: CA 125: 166 U/mL — ABNORMAL HIGH (ref 0.0–30.2)

## 2009-01-30 ENCOUNTER — Ambulatory Visit: Payer: Self-pay | Admitting: Oncology

## 2009-02-03 LAB — CBC WITH DIFFERENTIAL/PLATELET
BASO%: 0.6 % (ref 0.0–2.0)
Basophils Absolute: 0.1 10*3/uL (ref 0.0–0.1)
EOS%: 0.1 % (ref 0.0–7.0)
HCT: 31.4 % — ABNORMAL LOW (ref 34.8–46.6)
HGB: 10.9 g/dL — ABNORMAL LOW (ref 11.6–15.9)
MCH: 32.2 pg (ref 26.0–34.0)
MCHC: 34.7 g/dL (ref 32.0–36.0)
MCV: 92.8 fL (ref 81.0–101.0)
MONO%: 4.7 % (ref 0.0–13.0)
NEUT%: 75.2 % (ref 39.6–76.8)
lymph#: 1.9 10*3/uL (ref 0.9–3.3)

## 2009-02-24 LAB — COMPREHENSIVE METABOLIC PANEL
ALT: 17 U/L (ref 0–35)
AST: 16 U/L (ref 0–37)
Albumin: 4.1 g/dL (ref 3.5–5.2)
BUN: 19 mg/dL (ref 6–23)
Calcium: 8.6 mg/dL (ref 8.4–10.5)
Chloride: 106 mEq/L (ref 96–112)
Potassium: 4.1 mEq/L (ref 3.5–5.3)
Sodium: 142 mEq/L (ref 135–145)
Total Protein: 6.3 g/dL (ref 6.0–8.3)

## 2009-02-24 LAB — CBC WITH DIFFERENTIAL/PLATELET
BASO%: 0.4 % (ref 0.0–2.0)
HCT: 31.9 % — ABNORMAL LOW (ref 34.8–46.6)
MCHC: 32.9 g/dL (ref 31.5–36.0)
MONO#: 0.5 10*3/uL (ref 0.1–0.9)
NEUT%: 48.4 % (ref 38.4–76.8)
RDW: 20.6 % — ABNORMAL HIGH (ref 11.2–14.5)
WBC: 4.5 10*3/uL (ref 3.9–10.3)
lymph#: 1.8 10*3/uL (ref 0.9–3.3)
nRBC: 1 % — ABNORMAL HIGH (ref 0–0)

## 2009-03-14 ENCOUNTER — Ambulatory Visit: Payer: Self-pay | Admitting: Oncology

## 2009-03-17 LAB — COMPREHENSIVE METABOLIC PANEL
ALT: 19 U/L (ref 0–35)
AST: 20 U/L (ref 0–37)
Albumin: 3.8 g/dL (ref 3.5–5.2)
BUN: 12 mg/dL (ref 6–23)
CO2: 24 mEq/L (ref 19–32)
Calcium: 9.2 mg/dL (ref 8.4–10.5)
Chloride: 106 mEq/L (ref 96–112)
Creatinine, Ser: 0.7 mg/dL (ref 0.40–1.20)
Potassium: 3.6 mEq/L (ref 3.5–5.3)

## 2009-03-17 LAB — CBC WITH DIFFERENTIAL/PLATELET
BASO%: 0.3 % (ref 0.0–2.0)
Eosinophils Absolute: 0 10*3/uL (ref 0.0–0.5)
HCT: 31.5 % — ABNORMAL LOW (ref 34.8–46.6)
HGB: 10.1 g/dL — ABNORMAL LOW (ref 11.6–15.9)
LYMPH%: 41 % (ref 14.0–49.7)
MONO#: 0.3 10*3/uL (ref 0.1–0.9)
NEUT#: 1.6 10*3/uL (ref 1.5–6.5)
NEUT%: 49.5 % (ref 38.4–76.8)
Platelets: 176 10*3/uL (ref 145–400)
WBC: 3.3 10*3/uL — ABNORMAL LOW (ref 3.9–10.3)
lymph#: 1.3 10*3/uL (ref 0.9–3.3)

## 2009-03-17 LAB — CA 125: CA 125: 264.6 U/mL — ABNORMAL HIGH (ref 0.0–30.2)

## 2009-04-04 LAB — CBC WITH DIFFERENTIAL/PLATELET
BASO%: 0.5 % (ref 0.0–2.0)
EOS%: 0.3 % (ref 0.0–7.0)
HCT: 27.5 % — ABNORMAL LOW (ref 34.8–46.6)
LYMPH%: 47.3 % (ref 14.0–49.7)
MCH: 33.7 pg (ref 25.1–34.0)
MCHC: 33.7 g/dL (ref 31.5–36.0)
NEUT%: 45.6 % (ref 38.4–76.8)
Platelets: 90 10*3/uL — ABNORMAL LOW (ref 145–400)

## 2009-04-04 LAB — COMPREHENSIVE METABOLIC PANEL
ALT: 15 U/L (ref 0–35)
CO2: 27 mEq/L (ref 19–32)
Creatinine, Ser: 0.88 mg/dL (ref 0.40–1.20)
Total Bilirubin: 0.7 mg/dL (ref 0.3–1.2)

## 2009-04-04 LAB — CA 125: CA 125: 250.1 U/mL — ABNORMAL HIGH (ref 0.0–30.2)

## 2009-04-07 ENCOUNTER — Ambulatory Visit: Payer: Self-pay | Admitting: Vascular Surgery

## 2009-04-07 ENCOUNTER — Encounter: Payer: Self-pay | Admitting: Oncology

## 2009-04-07 ENCOUNTER — Ambulatory Visit: Admission: RE | Admit: 2009-04-07 | Discharge: 2009-04-07 | Payer: Self-pay | Admitting: Oncology

## 2009-04-07 LAB — CBC WITH DIFFERENTIAL/PLATELET
Basophils Absolute: 0 10*3/uL (ref 0.0–0.1)
EOS%: 0.3 % (ref 0.0–7.0)
Eosinophils Absolute: 0 10*3/uL (ref 0.0–0.5)
HCT: 30.5 % — ABNORMAL LOW (ref 34.8–46.6)
HGB: 9.9 g/dL — ABNORMAL LOW (ref 11.6–15.9)
MCH: 32 pg (ref 25.1–34.0)
MONO#: 0.3 10*3/uL (ref 0.1–0.9)
NEUT#: 1.5 10*3/uL (ref 1.5–6.5)
NEUT%: 50.4 % (ref 38.4–76.8)
lymph#: 1.2 10*3/uL (ref 0.9–3.3)

## 2009-04-18 LAB — COMPREHENSIVE METABOLIC PANEL
CO2: 27 mEq/L (ref 19–32)
Creatinine, Ser: 0.76 mg/dL (ref 0.40–1.20)
Glucose, Bld: 102 mg/dL — ABNORMAL HIGH (ref 70–99)
Sodium: 139 mEq/L (ref 135–145)
Total Bilirubin: 0.5 mg/dL (ref 0.3–1.2)
Total Protein: 6.4 g/dL (ref 6.0–8.3)

## 2009-04-18 LAB — CBC WITH DIFFERENTIAL/PLATELET
Eosinophils Absolute: 0 10*3/uL (ref 0.0–0.5)
HCT: 24.3 % — ABNORMAL LOW (ref 34.8–46.6)
HGB: 8.3 g/dL — ABNORMAL LOW (ref 11.6–15.9)
LYMPH%: 72 % — ABNORMAL HIGH (ref 14.0–49.7)
MONO#: 0.2 10*3/uL (ref 0.1–0.9)
NEUT#: 0.5 10*3/uL — ABNORMAL LOW (ref 1.5–6.5)
NEUT%: 20.6 % — ABNORMAL LOW (ref 38.4–76.8)
Platelets: 71 10*3/uL — ABNORMAL LOW (ref 145–400)
WBC: 2.4 10*3/uL — ABNORMAL LOW (ref 3.9–10.3)

## 2009-04-18 LAB — CA 125: CA 125: 236.6 U/mL — ABNORMAL HIGH (ref 0.0–30.2)

## 2009-04-28 ENCOUNTER — Ambulatory Visit: Payer: Self-pay | Admitting: Oncology

## 2009-04-28 LAB — CBC WITH DIFFERENTIAL/PLATELET
Eosinophils Absolute: 0 10*3/uL (ref 0.0–0.5)
HCT: 28.5 % — ABNORMAL LOW (ref 34.8–46.6)
LYMPH%: 47.8 % (ref 14.0–49.7)
MONO#: 0.4 10*3/uL (ref 0.1–0.9)
NEUT#: 1 10*3/uL — ABNORMAL LOW (ref 1.5–6.5)
NEUT%: 35.3 % — ABNORMAL LOW (ref 38.4–76.8)
Platelets: 172 10*3/uL (ref 145–400)
WBC: 2.7 10*3/uL — ABNORMAL LOW (ref 3.9–10.3)

## 2009-05-26 LAB — COMPREHENSIVE METABOLIC PANEL
ALT: 12 U/L (ref 0–35)
Alkaline Phosphatase: 48 U/L (ref 39–117)
CO2: 24 mEq/L (ref 19–32)
Creatinine, Ser: 0.82 mg/dL (ref 0.40–1.20)
Total Bilirubin: 0.9 mg/dL (ref 0.3–1.2)

## 2009-05-26 LAB — CBC WITH DIFFERENTIAL/PLATELET
BASO%: 0.6 % (ref 0.0–2.0)
Basophils Absolute: 0 10*3/uL (ref 0.0–0.1)
EOS%: 0 % (ref 0.0–7.0)
MCH: 29.3 pg (ref 25.1–34.0)
MCHC: 31.2 g/dL — ABNORMAL LOW (ref 31.5–36.0)
MCV: 94 fL (ref 79.5–101.0)
MONO%: 10 % (ref 0.0–14.0)
RBC: 3.31 10*6/uL — ABNORMAL LOW (ref 3.70–5.45)
RDW: 20.6 % — ABNORMAL HIGH (ref 11.2–14.5)
nRBC: 0 % (ref 0–0)

## 2009-05-26 LAB — CA 125: CA 125: 221.4 U/mL — ABNORMAL HIGH (ref 0.0–30.2)

## 2009-06-17 ENCOUNTER — Ambulatory Visit (HOSPITAL_COMMUNITY): Admission: RE | Admit: 2009-06-17 | Discharge: 2009-06-17 | Payer: Self-pay | Admitting: Oncology

## 2009-06-18 ENCOUNTER — Ambulatory Visit: Payer: Self-pay | Admitting: Oncology

## 2009-08-19 ENCOUNTER — Ambulatory Visit: Payer: Self-pay | Admitting: Oncology

## 2009-09-25 ENCOUNTER — Ambulatory Visit: Payer: Self-pay | Admitting: Oncology

## 2009-09-29 LAB — COMPREHENSIVE METABOLIC PANEL
ALT: 16 U/L (ref 0–35)
AST: 19 U/L (ref 0–37)
Albumin: 3.9 g/dL (ref 3.5–5.2)
CO2: 28 mEq/L (ref 19–32)
Calcium: 9.4 mg/dL (ref 8.4–10.5)
Chloride: 105 mEq/L (ref 96–112)
Potassium: 3.8 mEq/L (ref 3.5–5.3)
Total Protein: 6.9 g/dL (ref 6.0–8.3)

## 2009-09-29 LAB — CBC WITH DIFFERENTIAL/PLATELET
BASO%: 0.2 % (ref 0.0–2.0)
Basophils Absolute: 0 10*3/uL (ref 0.0–0.1)
EOS%: 4.8 % (ref 0.0–7.0)
HCT: 37 % (ref 34.8–46.6)
HGB: 12.7 g/dL (ref 11.6–15.9)
MCH: 29.9 pg (ref 25.1–34.0)
MCHC: 34.3 g/dL (ref 31.5–36.0)
MONO#: 0.4 10*3/uL (ref 0.1–0.9)
RDW: 18.4 % — ABNORMAL HIGH (ref 11.2–14.5)
WBC: 6.2 10*3/uL (ref 3.9–10.3)
lymph#: 2 10*3/uL (ref 0.9–3.3)

## 2009-09-29 LAB — CA 125: CA 125: 426.8 U/mL — ABNORMAL HIGH (ref 0.0–30.2)

## 2009-11-06 ENCOUNTER — Ambulatory Visit: Payer: Self-pay | Admitting: Oncology

## 2009-11-11 LAB — COMPREHENSIVE METABOLIC PANEL
Albumin: 3.5 g/dL (ref 3.5–5.2)
BUN: 19 mg/dL (ref 6–23)
CO2: 28 mEq/L (ref 19–32)
Calcium: 8.9 mg/dL (ref 8.4–10.5)
Chloride: 107 mEq/L (ref 96–112)
Glucose, Bld: 103 mg/dL — ABNORMAL HIGH (ref 70–99)
Potassium: 3.9 mEq/L (ref 3.5–5.3)

## 2009-11-11 LAB — CBC WITH DIFFERENTIAL/PLATELET
Basophils Absolute: 0 10*3/uL (ref 0.0–0.1)
Eosinophils Absolute: 0 10*3/uL (ref 0.0–0.5)
HGB: 11.9 g/dL (ref 11.6–15.9)
MCV: 92.1 fL (ref 79.5–101.0)
MONO#: 0.3 10*3/uL (ref 0.1–0.9)
MONO%: 5.7 % (ref 0.0–14.0)
NEUT#: 3.4 10*3/uL (ref 1.5–6.5)
RDW: 16.3 % — ABNORMAL HIGH (ref 11.2–14.5)
lymph#: 1.8 10*3/uL (ref 0.9–3.3)

## 2009-11-11 LAB — CA 125: CA 125: 416.1 U/mL — ABNORMAL HIGH (ref 0.0–30.2)

## 2009-12-17 ENCOUNTER — Ambulatory Visit: Payer: Self-pay | Admitting: Oncology

## 2009-12-22 LAB — CBC WITH DIFFERENTIAL/PLATELET
Basophils Absolute: 0 10*3/uL (ref 0.0–0.1)
EOS%: 0.3 % (ref 0.0–7.0)
MCH: 31 pg (ref 25.1–34.0)
MCHC: 33.5 g/dL (ref 31.5–36.0)
MCV: 92.4 fL (ref 79.5–101.0)
MONO%: 5.5 % (ref 0.0–14.0)
RBC: 4.1 10*6/uL (ref 3.70–5.45)
RDW: 15.6 % — ABNORMAL HIGH (ref 11.2–14.5)

## 2009-12-22 LAB — COMPREHENSIVE METABOLIC PANEL
AST: 23 U/L (ref 0–37)
Albumin: 4 g/dL (ref 3.5–5.2)
Alkaline Phosphatase: 75 U/L (ref 39–117)
BUN: 14 mg/dL (ref 6–23)
Potassium: 4.1 mEq/L (ref 3.5–5.3)

## 2010-02-02 ENCOUNTER — Ambulatory Visit: Payer: Self-pay | Admitting: Oncology

## 2010-02-03 LAB — COMPREHENSIVE METABOLIC PANEL
BUN: 19 mg/dL (ref 6–23)
CO2: 21 mEq/L (ref 19–32)
Calcium: 9.2 mg/dL (ref 8.4–10.5)
Chloride: 105 mEq/L (ref 96–112)
Creatinine, Ser: 0.69 mg/dL (ref 0.40–1.20)
Glucose, Bld: 83 mg/dL (ref 70–99)

## 2010-02-03 LAB — CBC WITH DIFFERENTIAL/PLATELET
Basophils Absolute: 0 10*3/uL (ref 0.0–0.1)
EOS%: 0.4 % (ref 0.0–7.0)
Eosinophils Absolute: 0 10*3/uL (ref 0.0–0.5)
HCT: 38.3 % (ref 34.8–46.6)
HGB: 12.8 g/dL (ref 11.6–15.9)
MCH: 30.7 pg (ref 25.1–34.0)
MONO#: 0.3 10*3/uL (ref 0.1–0.9)
NEUT%: 59.2 % (ref 38.4–76.8)
RBC: 4.17 10*6/uL (ref 3.70–5.45)
lymph#: 1.8 10*3/uL (ref 0.9–3.3)

## 2010-02-03 LAB — CA 125: CA 125: 876.5 U/mL — ABNORMAL HIGH (ref 0.0–30.2)

## 2010-02-13 ENCOUNTER — Ambulatory Visit (HOSPITAL_COMMUNITY): Admission: RE | Admit: 2010-02-13 | Discharge: 2010-02-13 | Payer: Self-pay | Admitting: Oncology

## 2010-03-10 ENCOUNTER — Ambulatory Visit: Payer: Self-pay | Admitting: Oncology

## 2010-03-12 LAB — CBC WITH DIFFERENTIAL/PLATELET
BASO%: 0.4 % (ref 0.0–2.0)
EOS%: 0.3 % (ref 0.0–7.0)
HCT: 38.3 % (ref 34.8–46.6)
LYMPH%: 28.2 % (ref 14.0–49.7)
MCH: 31 pg (ref 25.1–34.0)
MCHC: 33.9 g/dL (ref 31.5–36.0)
MONO#: 0.3 10*3/uL (ref 0.1–0.9)
NEUT%: 66.1 % (ref 38.4–76.8)
Platelets: 163 10*3/uL (ref 145–400)

## 2010-03-12 LAB — COMPREHENSIVE METABOLIC PANEL
ALT: 10 U/L (ref 0–35)
CO2: 21 mEq/L (ref 19–32)
Creatinine, Ser: 0.81 mg/dL (ref 0.40–1.20)
Total Bilirubin: 0.5 mg/dL (ref 0.3–1.2)

## 2010-03-12 LAB — CA 125: CA 125: 586.2 U/mL — ABNORMAL HIGH (ref 0.0–30.2)

## 2010-04-13 ENCOUNTER — Ambulatory Visit: Payer: Self-pay | Admitting: Oncology

## 2010-04-14 LAB — CBC WITH DIFFERENTIAL/PLATELET
Eosinophils Absolute: 0 10*3/uL (ref 0.0–0.5)
HCT: 39.6 % (ref 34.8–46.6)
LYMPH%: 38.3 % (ref 14.0–49.7)
MONO#: 0.5 10*3/uL (ref 0.1–0.9)
NEUT#: 3 10*3/uL (ref 1.5–6.5)
NEUT%: 52.8 % (ref 38.4–76.8)
Platelets: 147 10*3/uL (ref 145–400)
WBC: 5.7 10*3/uL (ref 3.9–10.3)

## 2010-05-07 LAB — CBC WITH DIFFERENTIAL/PLATELET
EOS%: 0.4 % (ref 0.0–7.0)
Eosinophils Absolute: 0 10*3/uL (ref 0.0–0.5)
LYMPH%: 41.2 % (ref 14.0–49.7)
MCH: 31.9 pg (ref 25.1–34.0)
MCV: 92 fL (ref 79.5–101.0)
MONO%: 9.9 % (ref 0.0–14.0)
NEUT#: 2.3 10*3/uL (ref 1.5–6.5)
Platelets: 145 10*3/uL (ref 145–400)
RBC: 3.85 10*6/uL (ref 3.70–5.45)

## 2010-05-07 LAB — COMPREHENSIVE METABOLIC PANEL
Alkaline Phosphatase: 78 U/L (ref 39–117)
BUN: 23 mg/dL (ref 6–23)
Glucose, Bld: 98 mg/dL (ref 70–99)
Sodium: 139 mEq/L (ref 135–145)
Total Bilirubin: 0.5 mg/dL (ref 0.3–1.2)
Total Protein: 6.6 g/dL (ref 6.0–8.3)

## 2010-05-22 ENCOUNTER — Ambulatory Visit: Payer: Self-pay | Admitting: Oncology

## 2010-05-26 LAB — CBC WITH DIFFERENTIAL/PLATELET
Basophils Absolute: 0 10*3/uL (ref 0.0–0.1)
Eosinophils Absolute: 0 10*3/uL (ref 0.0–0.5)
HGB: 12.5 g/dL (ref 11.6–15.9)
LYMPH%: 31 % (ref 14.0–49.7)
MCV: 93 fL (ref 79.5–101.0)
MONO#: 0.5 10*3/uL (ref 0.1–0.9)
MONO%: 7.6 % (ref 0.0–14.0)
NEUT#: 3.8 10*3/uL (ref 1.5–6.5)
Platelets: 148 10*3/uL (ref 145–400)
RBC: 4.02 10*6/uL (ref 3.70–5.45)
WBC: 6.3 10*3/uL (ref 3.9–10.3)
nRBC: 0 % (ref 0–0)

## 2010-05-26 LAB — COMPREHENSIVE METABOLIC PANEL
ALT: 15 U/L (ref 0–35)
CO2: 24 mEq/L (ref 19–32)
Calcium: 9.5 mg/dL (ref 8.4–10.5)
Chloride: 105 mEq/L (ref 96–112)
Creatinine, Ser: 0.88 mg/dL (ref 0.40–1.20)
Glucose, Bld: 106 mg/dL — ABNORMAL HIGH (ref 70–99)
Sodium: 140 mEq/L (ref 135–145)
Total Bilirubin: 0.5 mg/dL (ref 0.3–1.2)
Total Protein: 6.8 g/dL (ref 6.0–8.3)

## 2010-05-26 LAB — CA 125: CA 125: 843.3 U/mL — ABNORMAL HIGH (ref 0.0–30.2)

## 2010-06-09 LAB — CBC WITH DIFFERENTIAL/PLATELET
BASO%: 1 % (ref 0.0–2.0)
EOS%: 0.5 % (ref 0.0–7.0)
MCH: 31.7 pg (ref 25.1–34.0)
MCHC: 33.2 g/dL (ref 31.5–36.0)
MONO#: 0.5 10*3/uL (ref 0.1–0.9)
RDW: 17.8 % — ABNORMAL HIGH (ref 11.2–14.5)
WBC: 6 10*3/uL (ref 3.9–10.3)
lymph#: 2.1 10*3/uL (ref 0.9–3.3)
nRBC: 0 % (ref 0–0)

## 2010-06-09 LAB — COMPREHENSIVE METABOLIC PANEL
ALT: 20 U/L (ref 0–35)
AST: 18 U/L (ref 0–37)
Albumin: 3.8 g/dL (ref 3.5–5.2)
Alkaline Phosphatase: 63 U/L (ref 39–117)
BUN: 17 mg/dL (ref 6–23)
Chloride: 107 mEq/L (ref 96–112)
Potassium: 3.9 mEq/L (ref 3.5–5.3)
Sodium: 138 mEq/L (ref 135–145)
Total Protein: 6.4 g/dL (ref 6.0–8.3)

## 2010-06-23 ENCOUNTER — Ambulatory Visit: Payer: Self-pay | Admitting: Oncology

## 2010-06-23 LAB — CBC WITH DIFFERENTIAL/PLATELET
Basophils Absolute: 0 10*3/uL (ref 0.0–0.1)
EOS%: 0.3 % (ref 0.0–7.0)
Eosinophils Absolute: 0 10*3/uL (ref 0.0–0.5)
HCT: 36.5 % (ref 34.8–46.6)
HGB: 12.1 g/dL (ref 11.6–15.9)
MCH: 31.7 pg (ref 25.1–34.0)
MCV: 95.5 fL (ref 79.5–101.0)
NEUT#: 4 10*3/uL (ref 1.5–6.5)
NEUT%: 61.7 % (ref 38.4–76.8)
lymph#: 1.9 10*3/uL (ref 0.9–3.3)

## 2010-06-23 LAB — CA 125: CA 125: 834.5 U/mL — ABNORMAL HIGH (ref 0.0–30.2)

## 2010-06-24 LAB — COMPREHENSIVE METABOLIC PANEL
AST: 18 U/L (ref 0–37)
Albumin: 3.7 g/dL (ref 3.5–5.2)
BUN: 23 mg/dL (ref 6–23)
CO2: 26 mEq/L (ref 19–32)
Calcium: 9.2 mg/dL (ref 8.4–10.5)
Chloride: 107 mEq/L (ref 96–112)
Creatinine, Ser: 0.73 mg/dL (ref 0.40–1.20)
Glucose, Bld: 88 mg/dL (ref 70–99)

## 2010-07-07 LAB — CBC WITH DIFFERENTIAL/PLATELET
Basophils Absolute: 0 10*3/uL (ref 0.0–0.1)
Eosinophils Absolute: 0 10*3/uL (ref 0.0–0.5)
HGB: 11.8 g/dL (ref 11.6–15.9)
MONO#: 0.4 10*3/uL (ref 0.1–0.9)
NEUT#: 3.3 10*3/uL (ref 1.5–6.5)
RBC: 3.55 10*6/uL — ABNORMAL LOW (ref 3.70–5.45)
RDW: 16.9 % — ABNORMAL HIGH (ref 11.2–14.5)
WBC: 5.2 10*3/uL (ref 3.9–10.3)
lymph#: 1.4 10*3/uL (ref 0.9–3.3)

## 2010-07-10 ENCOUNTER — Ambulatory Visit (HOSPITAL_COMMUNITY): Admission: RE | Admit: 2010-07-10 | Discharge: 2010-07-10 | Payer: Self-pay | Admitting: Oncology

## 2010-08-13 ENCOUNTER — Ambulatory Visit: Payer: Self-pay | Admitting: Oncology

## 2010-10-06 ENCOUNTER — Ambulatory Visit: Payer: Self-pay | Admitting: Oncology

## 2010-10-23 LAB — CBC WITH DIFFERENTIAL/PLATELET
Basophils Absolute: 0 10*3/uL (ref 0.0–0.1)
Eosinophils Absolute: 0 10*3/uL (ref 0.0–0.5)
HGB: 12.8 g/dL (ref 11.6–15.9)
NEUT#: 3.6 10*3/uL (ref 1.5–6.5)
RBC: 4.12 10*6/uL (ref 3.70–5.45)
RDW: 15.7 % — ABNORMAL HIGH (ref 11.2–14.5)
WBC: 6 10*3/uL (ref 3.9–10.3)
lymph#: 1.9 10*3/uL (ref 0.9–3.3)

## 2010-10-23 LAB — COMPREHENSIVE METABOLIC PANEL
AST: 15 U/L (ref 0–37)
Albumin: 4.8 g/dL (ref 3.5–5.2)
BUN: 20 mg/dL (ref 6–23)
Calcium: 9.4 mg/dL (ref 8.4–10.5)
Chloride: 103 mEq/L (ref 96–112)
Glucose, Bld: 87 mg/dL (ref 70–99)
Potassium: 4 mEq/L (ref 3.5–5.3)
Sodium: 138 mEq/L (ref 135–145)
Total Protein: 7.1 g/dL (ref 6.0–8.3)

## 2010-10-27 ENCOUNTER — Ambulatory Visit (HOSPITAL_COMMUNITY): Admission: RE | Admit: 2010-10-27 | Discharge: 2010-10-27 | Payer: Self-pay | Admitting: Oncology

## 2010-11-05 ENCOUNTER — Ambulatory Visit: Payer: Self-pay | Admitting: Oncology

## 2010-12-08 ENCOUNTER — Ambulatory Visit: Payer: Self-pay | Admitting: Oncology

## 2010-12-29 LAB — CBC WITH DIFFERENTIAL/PLATELET
BASO%: 1.6 % (ref 0.0–2.0)
Basophils Absolute: 0.1 10*3/uL (ref 0.0–0.1)
EOS%: 0.3 % (ref 0.0–7.0)
Eosinophils Absolute: 0 10*3/uL (ref 0.0–0.5)
HCT: 39.5 % (ref 34.8–46.6)
HGB: 13.4 g/dL (ref 11.6–15.9)
LYMPH%: 29.1 % (ref 14.0–49.7)
MCH: 31.3 pg (ref 25.1–34.0)
MCHC: 33.9 g/dL (ref 31.5–36.0)
MCV: 92.2 fL (ref 79.5–101.0)
MONO#: 0.2 10*3/uL (ref 0.1–0.9)
MONO%: 4.4 % (ref 0.0–14.0)
NEUT#: 3.6 10*3/uL (ref 1.5–6.5)
NEUT%: 64.6 % (ref 38.4–76.8)
Platelets: 154 10*3/uL (ref 145–400)
RBC: 4.29 10*6/uL (ref 3.70–5.45)
RDW: 14.7 % — ABNORMAL HIGH (ref 11.2–14.5)
WBC: 5.6 10*3/uL (ref 3.9–10.3)
lymph#: 1.6 10*3/uL (ref 0.9–3.3)

## 2010-12-29 LAB — COMPREHENSIVE METABOLIC PANEL
ALT: 10 U/L (ref 0–35)
AST: 17 U/L (ref 0–37)
Albumin: 4.5 g/dL (ref 3.5–5.2)
Alkaline Phosphatase: 66 U/L (ref 39–117)
BUN: 17 mg/dL (ref 6–23)
CO2: 21 mEq/L (ref 19–32)
Calcium: 9.3 mg/dL (ref 8.4–10.5)
Chloride: 106 mEq/L (ref 96–112)
Creatinine, Ser: 0.66 mg/dL (ref 0.40–1.20)
Glucose, Bld: 89 mg/dL (ref 70–99)
Potassium: 3.8 mEq/L (ref 3.5–5.3)
Sodium: 140 mEq/L (ref 135–145)
Total Bilirubin: 0.6 mg/dL (ref 0.3–1.2)
Total Protein: 6.7 g/dL (ref 6.0–8.3)

## 2010-12-29 LAB — CA 125: CA 125: 1418.3 U/mL — ABNORMAL HIGH (ref 0.0–30.2)

## 2011-01-04 LAB — CBC WITH DIFFERENTIAL/PLATELET
BASO%: 0.5 % (ref 0.0–2.0)
Basophils Absolute: 0 10*3/uL (ref 0.0–0.1)
EOS%: 0.3 % (ref 0.0–7.0)
Eosinophils Absolute: 0 10*3/uL (ref 0.0–0.5)
HCT: 39.8 % (ref 34.8–46.6)
HGB: 13.5 g/dL (ref 11.6–15.9)
LYMPH%: 36.2 % (ref 14.0–49.7)
MCH: 30.8 pg (ref 25.1–34.0)
MCHC: 33.9 g/dL (ref 31.5–36.0)
MCV: 90.7 fL (ref 79.5–101.0)
MONO#: 0.4 10*3/uL (ref 0.1–0.9)
MONO%: 6.4 % (ref 0.0–14.0)
NEUT#: 3.3 10*3/uL (ref 1.5–6.5)
NEUT%: 56.6 % (ref 38.4–76.8)
Platelets: 145 10*3/uL (ref 145–400)
RBC: 4.39 10*6/uL (ref 3.70–5.45)
RDW: 14.1 % (ref 11.2–14.5)
WBC: 5.8 10*3/uL (ref 3.9–10.3)
lymph#: 2.1 10*3/uL (ref 0.9–3.3)
nRBC: 0 % (ref 0–0)

## 2011-01-04 LAB — COMPREHENSIVE METABOLIC PANEL
ALT: 14 U/L (ref 0–35)
AST: 17 U/L (ref 0–37)
Albumin: 3.8 g/dL (ref 3.5–5.2)
Alkaline Phosphatase: 60 U/L (ref 39–117)
BUN: 17 mg/dL (ref 6–23)
CO2: 26 mEq/L (ref 19–32)
Calcium: 9.3 mg/dL (ref 8.4–10.5)
Chloride: 104 mEq/L (ref 96–112)
Creatinine, Ser: 0.65 mg/dL (ref 0.40–1.20)
Glucose, Bld: 84 mg/dL (ref 70–99)
Potassium: 3.9 mEq/L (ref 3.5–5.3)
Sodium: 139 mEq/L (ref 135–145)
Total Bilirubin: 0.7 mg/dL (ref 0.3–1.2)
Total Protein: 6.6 g/dL (ref 6.0–8.3)

## 2011-01-05 ENCOUNTER — Encounter
Admission: RE | Admit: 2011-01-05 | Discharge: 2011-01-05 | Payer: Self-pay | Source: Home / Self Care | Attending: Oncology | Admitting: Oncology

## 2011-01-07 ENCOUNTER — Ambulatory Visit (HOSPITAL_BASED_OUTPATIENT_CLINIC_OR_DEPARTMENT_OTHER): Payer: Medicare Other | Admitting: Oncology

## 2011-01-10 ENCOUNTER — Encounter: Payer: Self-pay | Admitting: Oncology

## 2011-01-11 LAB — CBC WITH DIFFERENTIAL/PLATELET
Basophils Absolute: 0.1 10*3/uL (ref 0.0–0.1)
EOS%: 0.2 % (ref 0.0–7.0)
HCT: 37.5 % (ref 34.8–46.6)
HGB: 12.9 g/dL (ref 11.6–15.9)
LYMPH%: 22.5 % (ref 14.0–49.7)
MCH: 32.1 pg (ref 25.1–34.0)
MCV: 93.3 fL (ref 79.5–101.0)
MONO%: 4.8 % (ref 0.0–14.0)
NEUT%: 71.4 % (ref 38.4–76.8)

## 2011-01-25 ENCOUNTER — Other Ambulatory Visit: Payer: Self-pay | Admitting: Oncology

## 2011-01-25 ENCOUNTER — Encounter (HOSPITAL_BASED_OUTPATIENT_CLINIC_OR_DEPARTMENT_OTHER): Payer: Medicare Other | Admitting: Oncology

## 2011-01-25 DIAGNOSIS — C569 Malignant neoplasm of unspecified ovary: Secondary | ICD-10-CM

## 2011-01-25 DIAGNOSIS — Z452 Encounter for adjustment and management of vascular access device: Secondary | ICD-10-CM

## 2011-01-25 DIAGNOSIS — Z5111 Encounter for antineoplastic chemotherapy: Secondary | ICD-10-CM

## 2011-01-25 LAB — CBC WITH DIFFERENTIAL/PLATELET
Basophils Absolute: 0 10*3/uL (ref 0.0–0.1)
Eosinophils Absolute: 0 10*3/uL (ref 0.0–0.5)
HGB: 13.1 g/dL (ref 11.6–15.9)
LYMPH%: 34 % (ref 14.0–49.7)
MCV: 93.9 fL (ref 79.5–101.0)
MONO%: 4.8 % (ref 0.0–14.0)
NEUT#: 3.5 10*3/uL (ref 1.5–6.5)
NEUT%: 60.4 % (ref 38.4–76.8)
Platelets: 96 10*3/uL — ABNORMAL LOW (ref 145–400)
RBC: 4.05 10*6/uL (ref 3.70–5.45)

## 2011-01-25 LAB — COMPREHENSIVE METABOLIC PANEL
Alkaline Phosphatase: 75 U/L (ref 39–117)
BUN: 18 mg/dL (ref 6–23)
Creatinine, Ser: 0.74 mg/dL (ref 0.40–1.20)
Glucose, Bld: 86 mg/dL (ref 70–99)
Total Bilirubin: 0.8 mg/dL (ref 0.3–1.2)

## 2011-02-01 ENCOUNTER — Other Ambulatory Visit: Payer: Self-pay | Admitting: Oncology

## 2011-02-01 ENCOUNTER — Encounter (HOSPITAL_BASED_OUTPATIENT_CLINIC_OR_DEPARTMENT_OTHER): Payer: Medicare Other | Admitting: Oncology

## 2011-02-01 DIAGNOSIS — C569 Malignant neoplasm of unspecified ovary: Secondary | ICD-10-CM

## 2011-02-01 DIAGNOSIS — Z111 Encounter for screening for respiratory tuberculosis: Secondary | ICD-10-CM

## 2011-02-01 DIAGNOSIS — Z452 Encounter for adjustment and management of vascular access device: Secondary | ICD-10-CM

## 2011-02-01 LAB — CBC WITH DIFFERENTIAL/PLATELET
Eosinophils Absolute: 0 10*3/uL (ref 0.0–0.5)
HCT: 37.2 % (ref 34.8–46.6)
LYMPH%: 36.3 % (ref 14.0–49.7)
MONO#: 0.3 10*3/uL (ref 0.1–0.9)
NEUT#: 2.8 10*3/uL (ref 1.5–6.5)
NEUT%: 56.9 % (ref 38.4–76.8)
Platelets: 58 10*3/uL — ABNORMAL LOW (ref 145–400)
WBC: 4.9 10*3/uL (ref 3.9–10.3)
lymph#: 1.8 10*3/uL (ref 0.9–3.3)
nRBC: 0 % (ref 0–0)

## 2011-02-15 ENCOUNTER — Encounter (HOSPITAL_BASED_OUTPATIENT_CLINIC_OR_DEPARTMENT_OTHER): Payer: Medicare Other | Admitting: Oncology

## 2011-02-15 ENCOUNTER — Other Ambulatory Visit: Payer: Self-pay | Admitting: Oncology

## 2011-02-15 DIAGNOSIS — Z5111 Encounter for antineoplastic chemotherapy: Secondary | ICD-10-CM

## 2011-02-15 DIAGNOSIS — C569 Malignant neoplasm of unspecified ovary: Secondary | ICD-10-CM

## 2011-02-15 LAB — CBC WITH DIFFERENTIAL/PLATELET
BASO%: 0.6 % (ref 0.0–2.0)
EOS%: 0.3 % (ref 0.0–7.0)
Eosinophils Absolute: 0 10*3/uL (ref 0.0–0.5)
LYMPH%: 48 % (ref 14.0–49.7)
MCHC: 34 g/dL (ref 31.5–36.0)
MCV: 94 fL (ref 79.5–101.0)
MONO%: 14.3 % — ABNORMAL HIGH (ref 0.0–14.0)
NEUT#: 1.3 10*3/uL — ABNORMAL LOW (ref 1.5–6.5)
Platelets: 132 10*3/uL — ABNORMAL LOW (ref 145–400)
RBC: 3.85 10*6/uL (ref 3.70–5.45)
RDW: 16.2 % — ABNORMAL HIGH (ref 11.2–14.5)
nRBC: 0 % (ref 0–0)

## 2011-02-15 LAB — COMPREHENSIVE METABOLIC PANEL
ALT: 18 U/L (ref 0–35)
AST: 21 U/L (ref 0–37)
Alkaline Phosphatase: 64 U/L (ref 39–117)
CO2: 27 mEq/L (ref 19–32)
Creatinine, Ser: 1.03 mg/dL (ref 0.40–1.20)
Sodium: 142 mEq/L (ref 135–145)
Total Bilirubin: 0.5 mg/dL (ref 0.3–1.2)
Total Protein: 6.1 g/dL (ref 6.0–8.3)

## 2011-02-22 ENCOUNTER — Other Ambulatory Visit: Payer: Self-pay | Admitting: Oncology

## 2011-02-22 ENCOUNTER — Encounter (HOSPITAL_BASED_OUTPATIENT_CLINIC_OR_DEPARTMENT_OTHER): Payer: Medicare Other | Admitting: Oncology

## 2011-02-22 DIAGNOSIS — Z5111 Encounter for antineoplastic chemotherapy: Secondary | ICD-10-CM

## 2011-02-22 DIAGNOSIS — C569 Malignant neoplasm of unspecified ovary: Secondary | ICD-10-CM

## 2011-02-22 LAB — CBC WITH DIFFERENTIAL/PLATELET
BASO%: 0.4 % (ref 0.0–2.0)
HCT: 37 % (ref 34.8–46.6)
LYMPH%: 38.2 % (ref 14.0–49.7)
MCH: 32.3 pg (ref 25.1–34.0)
MCHC: 34.3 g/dL (ref 31.5–36.0)
MCV: 94.1 fL (ref 79.5–101.0)
MONO#: 0.6 10*3/uL (ref 0.1–0.9)
MONO%: 11.6 % (ref 0.0–14.0)
NEUT%: 49.6 % (ref 38.4–76.8)
Platelets: 167 10*3/uL (ref 145–400)
RBC: 3.93 10*6/uL (ref 3.70–5.45)
WBC: 4.7 10*3/uL (ref 3.9–10.3)
nRBC: 0 % (ref 0–0)

## 2011-02-22 LAB — COMPREHENSIVE METABOLIC PANEL
AST: 16 U/L (ref 0–37)
BUN: 18 mg/dL (ref 6–23)
Calcium: 9.3 mg/dL (ref 8.4–10.5)
Chloride: 104 mEq/L (ref 96–112)
Creatinine, Ser: 1.01 mg/dL (ref 0.40–1.20)
Total Bilirubin: 0.4 mg/dL (ref 0.3–1.2)

## 2011-02-22 LAB — CA 125: CA 125: 704 U/mL — ABNORMAL HIGH (ref 0.0–30.2)

## 2011-04-09 ENCOUNTER — Encounter (HOSPITAL_BASED_OUTPATIENT_CLINIC_OR_DEPARTMENT_OTHER): Payer: Medicare Other | Admitting: Oncology

## 2011-04-09 ENCOUNTER — Other Ambulatory Visit: Payer: Self-pay | Admitting: Oncology

## 2011-04-09 DIAGNOSIS — Z5111 Encounter for antineoplastic chemotherapy: Secondary | ICD-10-CM

## 2011-04-09 DIAGNOSIS — C569 Malignant neoplasm of unspecified ovary: Secondary | ICD-10-CM

## 2011-04-09 LAB — CBC WITH DIFFERENTIAL/PLATELET
Basophils Absolute: 0 10*3/uL (ref 0.0–0.1)
Eosinophils Absolute: 0 10*3/uL (ref 0.0–0.5)
HCT: 37.8 % (ref 34.8–46.6)
HGB: 12.6 g/dL (ref 11.6–15.9)
MCV: 95.9 fL (ref 79.5–101.0)
NEUT#: 2.3 10*3/uL (ref 1.5–6.5)
NEUT%: 52.6 % (ref 38.4–76.8)
RDW: 14.3 % (ref 11.2–14.5)
lymph#: 1.8 10*3/uL (ref 0.9–3.3)

## 2011-04-09 LAB — COMPREHENSIVE METABOLIC PANEL
Albumin: 3.8 g/dL (ref 3.5–5.2)
CO2: 27 mEq/L (ref 19–32)
Calcium: 9 mg/dL (ref 8.4–10.5)
Chloride: 106 mEq/L (ref 96–112)
Glucose, Bld: 96 mg/dL (ref 70–99)
Potassium: 4 mEq/L (ref 3.5–5.3)
Sodium: 140 mEq/L (ref 135–145)
Total Protein: 6.1 g/dL (ref 6.0–8.3)

## 2011-04-09 LAB — CA 125: CA 125: 581.4 U/mL — ABNORMAL HIGH (ref 0.0–30.2)

## 2011-04-16 ENCOUNTER — Encounter (HOSPITAL_BASED_OUTPATIENT_CLINIC_OR_DEPARTMENT_OTHER): Payer: Medicare Other | Admitting: Oncology

## 2011-04-16 ENCOUNTER — Other Ambulatory Visit: Payer: Self-pay | Admitting: Oncology

## 2011-04-16 DIAGNOSIS — C569 Malignant neoplasm of unspecified ovary: Secondary | ICD-10-CM

## 2011-04-16 DIAGNOSIS — Z5111 Encounter for antineoplastic chemotherapy: Secondary | ICD-10-CM

## 2011-04-16 LAB — CBC WITH DIFFERENTIAL/PLATELET
BASO%: 0.4 % (ref 0.0–2.0)
LYMPH%: 41.5 % (ref 14.0–49.7)
MCHC: 33.9 g/dL (ref 31.5–36.0)
MONO#: 0.4 10*3/uL (ref 0.1–0.9)
Platelets: 155 10*3/uL (ref 145–400)
RBC: 3.9 10*6/uL (ref 3.70–5.45)
RDW: 14.4 % (ref 11.2–14.5)
WBC: 5.3 10*3/uL (ref 3.9–10.3)
lymph#: 2.2 10*3/uL (ref 0.9–3.3)

## 2011-04-26 ENCOUNTER — Other Ambulatory Visit: Payer: Self-pay | Admitting: Oncology

## 2011-04-26 ENCOUNTER — Encounter (HOSPITAL_BASED_OUTPATIENT_CLINIC_OR_DEPARTMENT_OTHER): Payer: Medicare Other | Admitting: Oncology

## 2011-04-26 DIAGNOSIS — C569 Malignant neoplasm of unspecified ovary: Secondary | ICD-10-CM

## 2011-04-26 DIAGNOSIS — Z5111 Encounter for antineoplastic chemotherapy: Secondary | ICD-10-CM

## 2011-04-26 LAB — CBC WITH DIFFERENTIAL/PLATELET
BASO%: 0.7 % (ref 0.0–2.0)
HCT: 38 % (ref 34.8–46.6)
MCHC: 34.5 g/dL (ref 31.5–36.0)
MONO#: 0.4 10*3/uL (ref 0.1–0.9)
NEUT#: 3.4 10*3/uL (ref 1.5–6.5)
NEUT%: 58.1 % (ref 38.4–76.8)
WBC: 5.9 10*3/uL (ref 3.9–10.3)
lymph#: 2 10*3/uL (ref 0.9–3.3)

## 2011-04-27 LAB — COMPREHENSIVE METABOLIC PANEL
ALT: 12 U/L (ref 0–35)
AST: 17 U/L (ref 0–37)
Albumin: 4.3 g/dL (ref 3.5–5.2)
BUN: 17 mg/dL (ref 6–23)
CO2: 22 mEq/L (ref 19–32)
Calcium: 9.1 mg/dL (ref 8.4–10.5)
Chloride: 108 mEq/L (ref 96–112)
Creatinine, Ser: 0.71 mg/dL (ref 0.40–1.20)
Potassium: 3.4 mEq/L — ABNORMAL LOW (ref 3.5–5.3)

## 2011-04-27 LAB — CA 125: CA 125: 838.6 U/mL — ABNORMAL HIGH (ref 0.0–30.2)

## 2011-05-07 NOTE — Op Note (Signed)
[facility]  Patient:    Cassandra Nichols, Cassandra Nichols                         MRN: [id number] Proc. Date: 06/20/01 Adm. Date:  [id number] Attending:  Jenean Lindau CC:         Duncan Dull, M.D.  Laqueta Linden, M.D.  Telford Nab, RN   Operative Report  PREOPERATIVE DIAGNOSIS:  Pelvis mass with elevated CEA-125.  POSTOPERATIVE DIAGNOSIS:  Stage 3 ovarian cancer, optimally debulked.  OPERATION PERFORMED:  Radical debulking of ovarian cancer with total abdominal hysterectomy, bilateral salpingo-oophorectomy, omentectomy, pelvic and periaortic lymphadenectomies, biopsies and washings, appendectomy.  SURGEON: 1. John T. Kyla Balzarine, M.D. 2. Laqueta Linden, M.D.  ASSISTANT:  ANESTHESIA:  General endotracheal.  INDICATIONS FOR PROCEDURE:  This 70 year old woman presented with a palpable abdominopelvic mass, MRI revealing complex bilateral adnexal masses and CA125 value elevated above 9000.  Examination under anesthesia confirmed a large abdominopelvic mass with a slightly mobile abdominal mass in the left lower quadrant extending nearly to the umbilicus.  On bimanual and rectovaginal examinations, there was a mass which filled the posterior cul-de-sac and could not be separated from the larger left lower quadrant mass.  Upon entering the abdomen, there was minimal intra-abdominal fluid.  A 15 cm solid cystic mass replaced the left ovary and was adherent to the descending and sigmoid colon.  This mass ruptured spontaneously during surgery.  The right ovary was enlarged to approximately 7 cm and was adherent to the posterior cul-de-sac and sigmoid colon.  There was no gross intra-abdominal carcinomatosis but there were small, less than 5 mm implants involving the right hemidiaphragm, over the dome of the liver. Likewise, para-aortic nodes were enlarged to 0.5 cm and firm, suspicious for metastatic disease.  There was no omental, liver or other serosal  metastasis. The appendix was grossly normal.  Following conclusion of the procedure, the patient was optimally debulked with the only residual remaining being the diaphragm implants.  DESCRIPTION OF PROCEDURE:  The patient was prepped and draped in the low lithotomy position using direct placement stirrups and explored through a left paramedian incision after examination under anesthesia revealed findings as described above.  An incision was developed with scalpel and electrocautery, peritoneum entered atraumatically and washings obtained from the pelvis.  The findings described above were encountered.  A Buchwalter retractor was placed and bowel packed out of the pelvis.  Using sharp and blunt dissection, with electrocautery for hemostasis, the left posterior leaf of the broad ligament was opened and left round ligament was divided.  The left pararectal space was partially developed and ureter identified.  The left infundibulopelvic ligament was isolated, crossclamped, divided and ligated with free tie and suture ligature.  All suture material was 2-0 Vicryl unless otherwise otherwise specified.  The utero-ovarian ligament and tube were clamped on the left side and ureter dissected away from the medial leaf of the broad ligament.  The left ovarian mass was mobilized by incising the medial leaf of the broad ligament and dividing the utero-ovarian ligament.  Using sharp and blunt dissection with electrocautery for hemostasis, adhesions between the mass and colon were divided.  During this dissection, the mass spontaneously ruptured, yielding 300 cc of turbid, old blood.  After the mass was freed, it was submitted for frozen section which returned positive for a poorly differentiated adenocarcinoma of the ovary.  A total abdominal hysterectomy and right salpingo-oophorectomy were then performed.  The cornua of the uterus were grasped with long Kelly clamps.  The right round ligament was  divided with electrocautery, posterior leaf of the broad ligament opened and right pararectal space developed with sharp and blunt dissection.  The right infundibulopelvic ligament was isolated away form the ureter, crossclamped, divided and suture ligated.  The right ureter was mobilized off of the medial leaf of the broad ligament and attachments between the right ovarian mass and posterior cul-de-sac/terminal sigmoid colon were divided with sharp dissection and electrocautery.  The anterior peritoneal reflection of the uterus was opened and bladder flap developed with sharp and blunt dissection and electrocautery for hemostasis.  The uterine vessels bilaterally were skeletonized, crossclamped, divided and suture ligated. Alternating on the right and left sides, parametrial pedicles were developed by clamping adjacent to the cervix, dividing and suture ligating.  The final pedicle was entered, the lateral fornices of the vagina and specimen was amputated.  The vaginal cuff was closed with locked running 0 Vicryl.  At this juncture, frozen section returned positive for ovarian adenocarcinoma and a survey at the abdominoperitoneal surfaces was performed.  There was no gross carcinomatosis other than the small implants involving the right hemidiaphragm, over the dome of the liver.  Omentectomy was performed, mobilizing the dependent omentum off of the transverse colon and developing pedicles which were controlled with electrocautery or crossclamped, divided and ligated with free ties.  A parametrial clamp was used to take "pinch" biopsies of the right hemidiaphragm.  Because there was no gross carcinomatosis and the patient was optimally debulked with largest upper abdominal disease left-sided 1 cm in diameter, bilateral pelvic and para-aortic lymph node dissection was performed.  The perirectal and perivesical spaces bilaterally were fully developed.  Lymph  nodes anterior and medial to  the external iliac vessels from the bifurcation to the inguinal ligament were cleared off of the vessels using sharp and blunt dissection with electrocautery for hemostasis.  On each side, the obturator nerves were identified and lymphatic fat pad isolated away from the nerve. All lymphatic tissue above the obturator nerve was cleared using sharp and blunt dissection with electrocautery for hemostasis.  Hot packs were placed in the pelvis.  The retractors were adjusted, exposing the lower aorta to the retroperitoneal duodenum.  The peritoneum overlying the aorta and right external iliac artery was opened, retroperitoneal spaces developed  structures retracted away.  Several para-aortic nodes enlarged to approximately 5 mm and relatively firm were identified in the right precaval and left aortic regions. Using sharp and blunt dissection with hemoclips for hemostasis, the lymphatic fat pad overlying the distal vena cava, right common iliac artery and lower aorta was mobilized, resecting all nodes anterior to the vena cava and aorta/common iliac artery.  The dissection continued for 3 to 4 cm above the level of the inferior mesenteric artery, where we crossed over the midline, resecting high left periaortic nodes.  Palpation of the retroperitoneal spaces revealed no additional enlarged or suspicious lymph nodes.  Packs were places after obtaining hemostasis with electrocautery.  Because of a 10% incidence of occult involvement of the appendix, the appendix was elevated, mesentery controlled with electrocautery for hemostasis.  The base of the appendix was crushed, crossclamped, divided and suture ligated with 2-0 Vicryl.  The abdomen and pelvis were copiously irrigated and all sponges and retractors were removed.  Additional hemostasis was achieved where necessary with focal electrocautery.  The abdominal wall was closed in layers, irrigating between layers and using a running Smeade  Jones  closure of #1 PDS for rectus fascia and muscles and skin clips for the skin.  The patient tolerated the procedure well and was returned to the recovery room in stable condition.  ESTIMATED BLOOD LOSS:  350 cc.  TRANSFUSIONS:  None.  DRAINS, PACKS, ETC:  Foley catheter to dependent drainage.  Sponge and instrument counts were correct. DD:  06/20/01 TD:  06/20/01 Job: [id number]

## 2011-05-07 NOTE — Consult Note (Signed)
Cassandra Nichols, Cassandra Nichols NO.:  [id number]   MEDICAL RECORD NO.:  [id number]          PATIENT TYPE:  OUT   LOCATION:  GYN                          FACILITY:  [facility]   PHYSICIAN:  John T. Kyla Balzarine, M.D.    DATE OF BIRTH:  09-22-1941   DATE OF CONSULTATION:  DATE OF DISCHARGE:                                   CONSULTATION   CHIEF COMPLAINT:  Re-evaluation of recurrent ovarian cancer, receiving  chemotherapy.   HISTORY OF PRESENT ILLNESS:  The patient was debulked of stage IIIC ovarian  cancer in July 2002.  She received Taxol and carboplatin combined with  Gemzar for three cycles on GOG protocol was withdrawn because of protracted  toxicity.  She completed Taxol and carboplatin in January 2003.  She  relapsed, in January 2006, and was treated with Doxil with values dropping  to approximately 40 and negative scans.  At the time of her most recent  visit with Dr. Darnelle Catalan, a CA-125 value had markedly increased and CT scan  revealed disease.  She is here for discussion of options for treatment and  the patient currently is reluctant to consider any debulking type procedure.   PAST MEDICAL HISTORY:  1.  No major illnesses other than mild depression.  2.  Ganglion cyst from left wrist.  3.  Ovarian cancer surgery and chemotherapy.   MEDICATIONS:  Paxil and Advil p.r.n.   ALLERGIES:  PENICILLIN.   PERSONAL SOCIAL HISTORY:  The patient has significant financial  difficulties.  She denies __________  .   REVIEW OF SYSTEMS:  Otherwise negative in 10 of 10 systems.  The patient is  at full function and states she feels as good as I ever have during this  time.   PHYSICAL EXAMINATION:  VITAL SIGNS:  Stable and afebrile with weight 138  pounds (stable).  GENERAL:  The patient is anxious, alert, and oriented x3, in no acute  distress.  LYMPH NODES:  Survey reveals no pathologic lymphadenopathy.  LUNGS:  Fields clear.  ABDOMEN:  Soft and benign with no palpable mass,  tenderness, hernia, or  organomegaly.  Well-healed surgical incisions.  EXTREMITIES:  Have full  strength and range of motion without edema.  PELVIC:  External genitalia and BUS are normal to inspection, palpation.  Bladder, urethra, and vagina are clear.  Bimanual and rectovaginal  examinations disclose absent uterus and cervix.  No mass or nodularity.   LABORATORY:  I have reviewed the films of CT of chest, abdomen and pelvis.  Dominant findings are 4.6 x to 2.4-cm mass in the left cardiophrenic angle,  essentially adjacent to the pericardium and the left lobe of the liver.  It  is unclear whether this involves the diaphragm or traverses the diaphragm.  Additionally, a sclerotic lesion is present in the left acetabulum that is  thought to be larger per radiology's interpretation.   ASSESSMENT:  Recurrent ovarian cancer, carboplatin sensitive.   PLAN:  I had long discussion with the patient and subsequently discussed  management with Dr. Marikay Alar Magrinat.  Unfortunately, I do  not believe the  disease is amendable to surgical debulking and the patient is reluctant to  pursue this.  I recommended re-treating her with carboplatin and Taxol; Dr.  Darnelle Catalan and I discussed at length with the use of a weekly regimen to spare  her some toxicity.      John T. Kyla Balzarine, M.D.  Electronically Signed     JTS/MEDQ  D:  05/17/2006  T:  05/17/2006  Job:  [id number]   cc:   Valentino Hue. Magrinat, M.D.  Fax: [phone]   Telford Nab, R.N.  [address]  [place], [place] [address]

## 2011-05-07 NOTE — Discharge Summary (Signed)
[facility]  Patient:    Cassandra Nichols, Cassandra Nichols                         MRN: [id number] Adm. Date:  [id number] Disc. Date: [id number] Attending:  Jenean Lindau CC:         Duncan Dull, M.D.  Telford Nab, R.N.  Jackquline Denmark. Kyla Balzarine, M.D.  Aliene Altes, M.D.   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  PROCEDURES:  Exploratory laparotomy with radical debulking of ovarian cancer with total abdominal hysterectomy, bilateral salpingo-oophorectomy, omentectomy, pelvic and periaortic lymphadenectomy, biopsies and washings, and appendectomy.  COMPLICATIONS:  None.  TRANSFUSIONS:  None.  HOSPITAL CONSULTATIONS:  None.  HISTORY OF PRESENT ILLNESS:  Cassandra Nichols is a 70 year old female, menopausal, without regular health care for many years who presented to Dr. Shaune Pollack with a large pelvic mass. She underwent pelvic ultrasound which confirmed a huge complex mass that appeared to be arising from the ovaries and was worrisome for malignancy. She had an elevated CA-125 of 9030. She was referred to this physician and subsequently consulted with Gyn Oncology and was scheduled for surgery. Please see dictated history and physical for full details of the history of present illness, past history, family history, social history, examination, and laboratory studies on admission.  HOSPITAL COURSE:  The patient was admitted for same day surgery on June 20, 2001 having undergo a mechanical bowel prep at home. She underwent the above-mentioned procedures performed by Dr. Jonny Ruiz T. Soper of the department of Gyn Oncology and Dr. Myrlene Broker. Intraoperative blood loss was approximately 300 cc, and there were no intraoperative complications.  Postoperatively, she maintained excellent urinary output and had stable vital signs. She had a slight amount of oozing on the dressing initially, but  this stopped with reinforcement of the dressing. She had some initial decrease in urinary output felt to be due to dehydration due to her bowel prep but this resolved with adequate hydration.  On postoperative day #1, she had some nausea with ambulation but was tolerating sips, maintained excellent urinary output, and her incision remained dry. Her hemoglobin was 10.6 down from a preoperative value of 13.3. Her sodium was slightly low at 134 and her potassium was 3.2. She ambulated aggressively and had potassium added to her IV.  On postoperative day #2, she was tolerating liquid and crackers with no further nausea or vomiting but still had not had any flatus. She had a nondistended abdomen but very hypoactive bowel sounds at that time. It was felt that she had a slow return of bowel function due to her extensive surgery and appendectomy. She received a Dulcolax suppository and her potassium rechecked, which was normal at 4.0 on repeat.  On postoperative day #3, she was slightly queasy but had passed two stools and quite a bit of flatus that morning. Her abdomen again was slightly distended but not tight and she had normal bowel sounds. She was given Phenergan and observed throughout postoperative day #3, and by the afternoon, she was tolerating small amounts of crackers and soup and greatly desired discharge. She was therefore discharged home with routine instructions in improved condition. She was told to follow up in Dr. Lonn Georgia office on June 27, 2001 for staple removal. She was going to follow up with Dr. Kyla Balzarine on August 13 at 3:30 for a six-week visit. Arrangements were also made for her  to see Dr. Aliene Altes, the medical oncologist for initial consultation regarding ovarian cancer and chemotherapy. This appointment was scheduled for July 17, at 9:45 a.m. She was also seen by Dr. Kyla Balzarine prior to discharge and he discussed the pathology results and need for chemotherapy.  She was  discharged home the evening of postoperative day #3 in improved condition.  DISCHARGE MEDICATIONS: 1. Phenergan 25 mg, dispense 10, one half to one q.4-6h. p.r.n. nausea with    one refill. 2. Percocet, dispense 20, one to two q.4-6h. p.r.n. pain with no refills.  DISCHARGE FOLLOWUP:  As noted above. She is to follow up sooner for excessive pain, fever, bleeding, or other concerns. She is to take stool softeners, simethicone, or milk of magnesia as needed.  DD:  07/18/01 TD:  07/18/01 Job: [id number]

## 2011-05-24 ENCOUNTER — Other Ambulatory Visit: Payer: Self-pay | Admitting: Oncology

## 2011-05-24 ENCOUNTER — Encounter (HOSPITAL_BASED_OUTPATIENT_CLINIC_OR_DEPARTMENT_OTHER): Payer: Medicare Other | Admitting: Oncology

## 2011-05-24 DIAGNOSIS — Z5111 Encounter for antineoplastic chemotherapy: Secondary | ICD-10-CM

## 2011-05-24 DIAGNOSIS — B029 Zoster without complications: Secondary | ICD-10-CM

## 2011-05-24 DIAGNOSIS — C569 Malignant neoplasm of unspecified ovary: Secondary | ICD-10-CM

## 2011-05-24 LAB — CBC WITH DIFFERENTIAL/PLATELET
BASO%: 0.2 % (ref 0.0–2.0)
EOS%: 1 % (ref 0.0–7.0)
HCT: 37.4 % (ref 34.8–46.6)
HGB: 12.8 g/dL (ref 11.6–15.9)
MCH: 32.7 pg (ref 25.1–34.0)
MCHC: 34.2 g/dL (ref 31.5–36.0)
MONO#: 0.3 10*3/uL (ref 0.1–0.9)
RDW: 14.9 % — ABNORMAL HIGH (ref 11.2–14.5)
WBC: 4.2 10*3/uL (ref 3.9–10.3)
lymph#: 1.7 10*3/uL (ref 0.9–3.3)

## 2011-06-02 ENCOUNTER — Other Ambulatory Visit: Payer: Self-pay | Admitting: Oncology

## 2011-06-02 ENCOUNTER — Encounter (HOSPITAL_BASED_OUTPATIENT_CLINIC_OR_DEPARTMENT_OTHER): Payer: Medicare Other | Admitting: Oncology

## 2011-06-02 DIAGNOSIS — C569 Malignant neoplasm of unspecified ovary: Secondary | ICD-10-CM

## 2011-06-02 DIAGNOSIS — Z5111 Encounter for antineoplastic chemotherapy: Secondary | ICD-10-CM

## 2011-06-02 LAB — CBC WITH DIFFERENTIAL/PLATELET
Basophils Absolute: 0.1 10*3/uL (ref 0.0–0.1)
Eosinophils Absolute: 0 10*3/uL (ref 0.0–0.5)
HCT: 35.2 % (ref 34.8–46.6)
HGB: 12.1 g/dL (ref 11.6–15.9)
MONO#: 0.3 10*3/uL (ref 0.1–0.9)
NEUT#: 1.6 10*3/uL (ref 1.5–6.5)
NEUT%: 41.6 % (ref 38.4–76.8)
RDW: 15.7 % — ABNORMAL HIGH (ref 11.2–14.5)
lymph#: 1.9 10*3/uL (ref 0.9–3.3)

## 2011-06-02 LAB — COMPREHENSIVE METABOLIC PANEL
ALT: 12 U/L (ref 0–35)
AST: 16 U/L (ref 0–37)
Alkaline Phosphatase: 69 U/L (ref 39–117)
Calcium: 9.5 mg/dL (ref 8.4–10.5)
Chloride: 103 mEq/L (ref 96–112)
Creatinine, Ser: 0.67 mg/dL (ref 0.50–1.10)

## 2011-06-09 ENCOUNTER — Encounter (HOSPITAL_BASED_OUTPATIENT_CLINIC_OR_DEPARTMENT_OTHER): Payer: Medicare Other | Admitting: Oncology

## 2011-06-09 ENCOUNTER — Other Ambulatory Visit: Payer: Self-pay | Admitting: Oncology

## 2011-06-09 DIAGNOSIS — C569 Malignant neoplasm of unspecified ovary: Secondary | ICD-10-CM

## 2011-06-09 DIAGNOSIS — Z5111 Encounter for antineoplastic chemotherapy: Secondary | ICD-10-CM

## 2011-06-09 LAB — CBC WITH DIFFERENTIAL/PLATELET
Basophils Absolute: 0 10*3/uL (ref 0.0–0.1)
EOS%: 0.2 % (ref 0.0–7.0)
Eosinophils Absolute: 0 10*3/uL (ref 0.0–0.5)
HCT: 37.7 % (ref 34.8–46.6)
HGB: 13 g/dL (ref 11.6–15.9)
LYMPH%: 41.4 % (ref 14.0–49.7)
MCH: 32.8 pg (ref 25.1–34.0)
MCV: 95.2 fL (ref 79.5–101.0)
MONO%: 9.3 % (ref 0.0–14.0)
NEUT%: 48.7 % (ref 38.4–76.8)
Platelets: 162 10*3/uL (ref 145–400)

## 2011-06-09 LAB — COMPREHENSIVE METABOLIC PANEL
Alkaline Phosphatase: 75 U/L (ref 39–117)
BUN: 18 mg/dL (ref 6–23)
CO2: 26 mEq/L (ref 19–32)
Glucose, Bld: 90 mg/dL (ref 70–99)
Total Bilirubin: 0.3 mg/dL (ref 0.3–1.2)

## 2011-06-10 LAB — CA 125: CA 125: 462 U/mL — ABNORMAL HIGH (ref 0.0–30.2)

## 2011-06-25 ENCOUNTER — Encounter (HOSPITAL_BASED_OUTPATIENT_CLINIC_OR_DEPARTMENT_OTHER): Payer: Medicare Other | Admitting: Oncology

## 2011-06-25 ENCOUNTER — Other Ambulatory Visit: Payer: Self-pay | Admitting: Oncology

## 2011-06-25 DIAGNOSIS — C569 Malignant neoplasm of unspecified ovary: Secondary | ICD-10-CM

## 2011-06-25 DIAGNOSIS — Z5111 Encounter for antineoplastic chemotherapy: Secondary | ICD-10-CM

## 2011-06-25 LAB — CBC WITH DIFFERENTIAL/PLATELET
BASO%: 0.5 % (ref 0.0–2.0)
Eosinophils Absolute: 0 10*3/uL (ref 0.0–0.5)
LYMPH%: 29 % (ref 14.0–49.7)
MCHC: 34.4 g/dL (ref 31.5–36.0)
MONO#: 0.4 10*3/uL (ref 0.1–0.9)
NEUT#: 3.7 10*3/uL (ref 1.5–6.5)
Platelets: 107 10*3/uL — ABNORMAL LOW (ref 145–400)
RBC: 3.95 10*6/uL (ref 3.70–5.45)
WBC: 5.7 10*3/uL (ref 3.9–10.3)
lymph#: 1.7 10*3/uL (ref 0.9–3.3)

## 2011-06-25 LAB — COMPREHENSIVE METABOLIC PANEL
ALT: 14 U/L (ref 0–35)
Albumin: 4.1 g/dL (ref 3.5–5.2)
CO2: 24 mEq/L (ref 19–32)
Calcium: 8.9 mg/dL (ref 8.4–10.5)
Chloride: 105 mEq/L (ref 96–112)
Glucose, Bld: 103 mg/dL — ABNORMAL HIGH (ref 70–99)
Potassium: 3.7 mEq/L (ref 3.5–5.3)
Sodium: 140 mEq/L (ref 135–145)
Total Bilirubin: 0.6 mg/dL (ref 0.3–1.2)
Total Protein: 6.1 g/dL (ref 6.0–8.3)

## 2011-06-25 LAB — CA 125: CA 125: 840.9 U/mL — ABNORMAL HIGH (ref 0.0–30.2)

## 2011-06-30 ENCOUNTER — Encounter (HOSPITAL_BASED_OUTPATIENT_CLINIC_OR_DEPARTMENT_OTHER): Payer: Medicare Other | Admitting: Oncology

## 2011-06-30 ENCOUNTER — Other Ambulatory Visit: Payer: Self-pay | Admitting: Oncology

## 2011-06-30 DIAGNOSIS — C569 Malignant neoplasm of unspecified ovary: Secondary | ICD-10-CM

## 2011-06-30 DIAGNOSIS — Z5111 Encounter for antineoplastic chemotherapy: Secondary | ICD-10-CM

## 2011-06-30 LAB — COMPREHENSIVE METABOLIC PANEL
ALT: 11 U/L (ref 0–35)
AST: 14 U/L (ref 0–37)
Albumin: 3.7 g/dL (ref 3.5–5.2)
Alkaline Phosphatase: 71 U/L (ref 39–117)
Calcium: 9.6 mg/dL (ref 8.4–10.5)
Chloride: 103 mEq/L (ref 96–112)
Potassium: 3.7 mEq/L (ref 3.5–5.3)
Sodium: 140 mEq/L (ref 135–145)
Total Protein: 6.7 g/dL (ref 6.0–8.3)

## 2011-06-30 LAB — CBC WITH DIFFERENTIAL/PLATELET
BASO%: 0.4 % (ref 0.0–2.0)
EOS%: 0.2 % (ref 0.0–7.0)
HCT: 38.8 % (ref 34.8–46.6)
MCH: 32.7 pg (ref 25.1–34.0)
MCHC: 34.3 g/dL (ref 31.5–36.0)
MONO#: 0.5 10*3/uL (ref 0.1–0.9)
NEUT%: 56.1 % (ref 38.4–76.8)
RBC: 4.07 10*6/uL (ref 3.70–5.45)
WBC: 5.5 10*3/uL (ref 3.9–10.3)
lymph#: 1.9 10*3/uL (ref 0.9–3.3)
nRBC: 0 % (ref 0–0)

## 2011-07-09 ENCOUNTER — Encounter (HOSPITAL_BASED_OUTPATIENT_CLINIC_OR_DEPARTMENT_OTHER): Payer: Medicare Other | Admitting: Oncology

## 2011-07-09 ENCOUNTER — Other Ambulatory Visit: Payer: Self-pay | Admitting: Oncology

## 2011-07-09 DIAGNOSIS — Z5111 Encounter for antineoplastic chemotherapy: Secondary | ICD-10-CM

## 2011-07-09 DIAGNOSIS — C569 Malignant neoplasm of unspecified ovary: Secondary | ICD-10-CM

## 2011-07-09 LAB — CBC WITH DIFFERENTIAL/PLATELET
EOS%: 0.4 % (ref 0.0–7.0)
LYMPH%: 33.8 % (ref 14.0–49.7)
MCH: 33.1 pg (ref 25.1–34.0)
MCV: 97 fL (ref 79.5–101.0)
MONO%: 4.5 % (ref 0.0–14.0)
Platelets: 67 10*3/uL — ABNORMAL LOW (ref 145–400)
RBC: 3.72 10*6/uL (ref 3.70–5.45)
RDW: 14.5 % (ref 11.2–14.5)
nRBC: 0 % (ref 0–0)

## 2011-07-09 LAB — COMPREHENSIVE METABOLIC PANEL
ALT: 10 U/L (ref 0–35)
AST: 15 U/L (ref 0–37)
Albumin: 3.5 g/dL (ref 3.5–5.2)
Alkaline Phosphatase: 70 U/L (ref 39–117)
BUN: 19 mg/dL (ref 6–23)
Calcium: 9.5 mg/dL (ref 8.4–10.5)
Chloride: 103 mEq/L (ref 96–112)
Creatinine, Ser: 0.55 mg/dL (ref 0.50–1.10)
Potassium: 3.9 mEq/L (ref 3.5–5.3)

## 2011-07-21 ENCOUNTER — Other Ambulatory Visit: Payer: Self-pay | Admitting: Oncology

## 2011-07-21 ENCOUNTER — Encounter (HOSPITAL_BASED_OUTPATIENT_CLINIC_OR_DEPARTMENT_OTHER): Payer: Medicare Other | Admitting: Oncology

## 2011-07-21 DIAGNOSIS — C569 Malignant neoplasm of unspecified ovary: Secondary | ICD-10-CM

## 2011-07-21 DIAGNOSIS — Z5111 Encounter for antineoplastic chemotherapy: Secondary | ICD-10-CM

## 2011-07-21 LAB — CBC WITH DIFFERENTIAL/PLATELET
Basophils Absolute: 0 10*3/uL (ref 0.0–0.1)
EOS%: 0.3 % (ref 0.0–7.0)
Eosinophils Absolute: 0 10*3/uL (ref 0.0–0.5)
HGB: 12.3 g/dL (ref 11.6–15.9)
LYMPH%: 37.5 % (ref 14.0–49.7)
MCH: 33.4 pg (ref 25.1–34.0)
MCV: 97 fL (ref 79.5–101.0)
MONO%: 4.8 % (ref 0.0–14.0)
NEUT#: 2.3 10*3/uL (ref 1.5–6.5)
Platelets: 72 10*3/uL — ABNORMAL LOW (ref 145–400)
RDW: 14.7 % — ABNORMAL HIGH (ref 11.2–14.5)

## 2011-07-21 LAB — COMPREHENSIVE METABOLIC PANEL
Alkaline Phosphatase: 66 U/L (ref 39–117)
BUN: 13 mg/dL (ref 6–23)
CO2: 26 mEq/L (ref 19–32)
Creatinine, Ser: 0.61 mg/dL (ref 0.50–1.10)
Glucose, Bld: 89 mg/dL (ref 70–99)
Sodium: 139 mEq/L (ref 135–145)
Total Bilirubin: 0.5 mg/dL (ref 0.3–1.2)

## 2011-07-22 LAB — CA 125: CA 125: 826.9 U/mL — ABNORMAL HIGH (ref 0.0–30.2)

## 2011-07-30 ENCOUNTER — Other Ambulatory Visit: Payer: Self-pay | Admitting: Oncology

## 2011-07-30 ENCOUNTER — Encounter (HOSPITAL_BASED_OUTPATIENT_CLINIC_OR_DEPARTMENT_OTHER): Payer: Medicare Other | Admitting: Oncology

## 2011-07-30 DIAGNOSIS — Z5111 Encounter for antineoplastic chemotherapy: Secondary | ICD-10-CM

## 2011-07-30 DIAGNOSIS — C569 Malignant neoplasm of unspecified ovary: Secondary | ICD-10-CM

## 2011-07-30 LAB — CBC WITH DIFFERENTIAL/PLATELET
BASO%: 1.3 % (ref 0.0–2.0)
MCHC: 29.3 g/dL — ABNORMAL LOW (ref 31.5–36.0)
MONO#: 0.4 10*3/uL (ref 0.1–0.9)
RBC: 3.67 10*6/uL — ABNORMAL LOW (ref 3.70–5.45)
RDW: 17.1 % — ABNORMAL HIGH (ref 11.2–14.5)
WBC: 3.9 10*3/uL (ref 3.9–10.3)
lymph#: 1.6 10*3/uL (ref 0.9–3.3)
nRBC: 0 % (ref 0–0)

## 2011-08-06 ENCOUNTER — Other Ambulatory Visit: Payer: Self-pay | Admitting: Oncology

## 2011-08-06 ENCOUNTER — Encounter (HOSPITAL_BASED_OUTPATIENT_CLINIC_OR_DEPARTMENT_OTHER): Payer: Medicare Other | Admitting: Oncology

## 2011-08-06 DIAGNOSIS — C569 Malignant neoplasm of unspecified ovary: Secondary | ICD-10-CM

## 2011-08-06 DIAGNOSIS — Z5111 Encounter for antineoplastic chemotherapy: Secondary | ICD-10-CM

## 2011-08-06 DIAGNOSIS — Z111 Encounter for screening for respiratory tuberculosis: Secondary | ICD-10-CM

## 2011-08-06 LAB — CBC WITH DIFFERENTIAL/PLATELET
Basophils Absolute: 0 10*3/uL (ref 0.0–0.1)
EOS%: 0.2 % (ref 0.0–7.0)
Eosinophils Absolute: 0 10*3/uL (ref 0.0–0.5)
HGB: 12.3 g/dL (ref 11.6–15.9)
MCH: 34.8 pg — ABNORMAL HIGH (ref 25.1–34.0)
NEUT#: 2.5 10*3/uL (ref 1.5–6.5)
RDW: 15.4 % — ABNORMAL HIGH (ref 11.2–14.5)
lymph#: 1.4 10*3/uL (ref 0.9–3.3)

## 2011-09-28 ENCOUNTER — Other Ambulatory Visit: Payer: Self-pay | Admitting: Oncology

## 2011-09-28 ENCOUNTER — Encounter (HOSPITAL_BASED_OUTPATIENT_CLINIC_OR_DEPARTMENT_OTHER): Payer: Medicare Other | Admitting: Oncology

## 2011-09-28 DIAGNOSIS — B029 Zoster without complications: Secondary | ICD-10-CM

## 2011-09-28 DIAGNOSIS — M25559 Pain in unspecified hip: Secondary | ICD-10-CM

## 2011-09-28 DIAGNOSIS — C569 Malignant neoplasm of unspecified ovary: Secondary | ICD-10-CM

## 2011-09-28 LAB — CBC WITH DIFFERENTIAL/PLATELET
EOS%: 0.4 % (ref 0.0–7.0)
Eosinophils Absolute: 0 10*3/uL (ref 0.0–0.5)
MCH: 33.3 pg (ref 25.1–34.0)
MCV: 96.8 fL (ref 79.5–101.0)
MONO%: 6 % (ref 0.0–14.0)
NEUT#: 2.7 10*3/uL (ref 1.5–6.5)
RBC: 3.75 10*6/uL (ref 3.70–5.45)
RDW: 13.7 % (ref 11.2–14.5)
nRBC: 0 % (ref 0–0)

## 2011-09-29 LAB — COMPREHENSIVE METABOLIC PANEL
BUN: 14 mg/dL (ref 6–23)
CO2: 24 mEq/L (ref 19–32)
Calcium: 9 mg/dL (ref 8.4–10.5)
Chloride: 109 mEq/L (ref 96–112)
Creatinine, Ser: 0.66 mg/dL (ref 0.50–1.10)
Glucose, Bld: 85 mg/dL (ref 70–99)

## 2011-09-29 LAB — CA 125: CA 125: 984.5 U/mL — ABNORMAL HIGH (ref 0.0–30.2)

## 2011-11-05 ENCOUNTER — Telehealth: Payer: Self-pay | Admitting: *Deleted

## 2011-11-05 ENCOUNTER — Other Ambulatory Visit: Payer: Self-pay | Admitting: Physician Assistant

## 2011-11-05 NOTE — Telephone Encounter (Signed)
CALLED LEFT PATIENT VOICE MESSAGE TO INFORM THE PATIENT OF THE NEW TIME BUT THE SAME DATE

## 2011-11-09 ENCOUNTER — Other Ambulatory Visit: Payer: Self-pay | Admitting: Oncology

## 2011-11-09 ENCOUNTER — Other Ambulatory Visit (HOSPITAL_BASED_OUTPATIENT_CLINIC_OR_DEPARTMENT_OTHER): Payer: Medicare Other | Admitting: Lab

## 2011-11-09 DIAGNOSIS — Z5111 Encounter for antineoplastic chemotherapy: Secondary | ICD-10-CM

## 2011-11-09 DIAGNOSIS — C569 Malignant neoplasm of unspecified ovary: Secondary | ICD-10-CM

## 2011-11-09 DIAGNOSIS — B021 Zoster meningitis: Secondary | ICD-10-CM

## 2011-11-09 LAB — CBC WITH DIFFERENTIAL/PLATELET
BASO%: 0.4 % (ref 0.0–2.0)
Basophils Absolute: 0 10*3/uL (ref 0.0–0.1)
Eosinophils Absolute: 0 10*3/uL (ref 0.0–0.5)
HCT: 37.3 % (ref 34.8–46.6)
HGB: 12.5 g/dL (ref 11.6–15.9)
MCHC: 33.6 g/dL (ref 31.5–36.0)
MONO#: 0.3 10*3/uL (ref 0.1–0.9)
NEUT#: 3.4 10*3/uL (ref 1.5–6.5)
NEUT%: 63.7 % (ref 38.4–76.8)
WBC: 5.4 10*3/uL (ref 3.9–10.3)
lymph#: 1.6 10*3/uL (ref 0.9–3.3)

## 2011-11-09 LAB — COMPREHENSIVE METABOLIC PANEL
ALT: 14 U/L (ref 0–35)
CO2: 28 mEq/L (ref 19–32)
Calcium: 10 mg/dL (ref 8.4–10.5)
Chloride: 104 mEq/L (ref 96–112)
Creatinine, Ser: 0.8 mg/dL (ref 0.50–1.10)

## 2011-11-09 LAB — CA 125: CA 125: 1278.9 U/mL — ABNORMAL HIGH (ref 0.0–30.2)

## 2011-11-17 ENCOUNTER — Ambulatory Visit: Payer: Medicare Other | Admitting: Oncology

## 2011-11-17 ENCOUNTER — Encounter: Payer: Self-pay | Admitting: Physician Assistant

## 2011-11-17 ENCOUNTER — Telehealth: Payer: Self-pay | Admitting: *Deleted

## 2011-11-17 ENCOUNTER — Ambulatory Visit (HOSPITAL_BASED_OUTPATIENT_CLINIC_OR_DEPARTMENT_OTHER): Payer: Medicare Other | Admitting: Physician Assistant

## 2011-11-17 VITALS — BP 143/77 | HR 78 | Temp 97.5°F | Ht 59.5 in | Wt 133.7 lb

## 2011-11-17 DIAGNOSIS — R7301 Impaired fasting glucose: Secondary | ICD-10-CM

## 2011-11-17 DIAGNOSIS — C569 Malignant neoplasm of unspecified ovary: Secondary | ICD-10-CM

## 2011-11-17 HISTORY — DX: Malignant neoplasm of unspecified ovary: C56.9

## 2011-11-17 NOTE — Progress Notes (Signed)
Hematology and Oncology Follow Up Visit  Cassandra Nichols [id number] 06-18-1941 70 y.o. 11/17/2011 3:40 PM   Interim History:   Patient returns today for followup of recurrent ovarian carcinoma. Most recently, she was treated with single agent carboplatin which was discontinued in mid August. The patient was followed with observation alone, with a recent increase in CA125 indicating need to resume treatment. Most recent CA125 was 1278.9 on November 20. This is up from 984.5 in early October and 826.9 in early August of this year. Otherwise, fortunately, the patient is asymptomatic from a clinical standpoint. Over half of our 50 minute appointment today was spent discussing these lab results and reviewing plans for further treatment.  She continues to be very busy, making "grand adventure books" for her grandchildren for Christmas. 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 She is exercising and doing some stretching at home. Physically, with the exception of some occasional tingling in the great toe on her right foot secondary to history of shingles, the patient denies any pain whatsoever. She has had no abdominal or pelvic pain. She is eating and drinking well and denies nausea, emesis, diarrhea, or constipation.  A detailed review of systems is otherwise noncontributory as noted below.  Review of Systems: Constitutional:  no weight loss, fever, night sweats and feels well Eyes: negative [id number] Cardiovascular: no chest pain or dyspnea on exertion Respiratory: no cough, shortness of breath, or wheezing Neurological: negative Dermatological: negative Gastrointestinal: no abdominal pain, change in bowel habits, or black or bloody stools Genito-Urinary: no dysuria, trouble voiding, or hematuria Hematological and Lymphatic: negative Breast: negative Musculoskeletal: negative Remaining ROS negative.  Medications:  I have reviewed the patient's current  medications.  Allergies:  Allergies  Allergen Reactions  . Penicillins Rash     Physical Exam:  Blood pressure 143/77, pulse 78, temperature 97.5 F (36.4 C), temperature source Oral, height 4' 11.5" (1.511 m), weight 133 lb 11.2 oz (60.646 kg). HEENT:  Sclerae anicteric, conjunctivae pink.  Oropharynx clear.  No mucositis or candidiasis.  Nodes:  No cervical, supraclavicular, or axillary lymphadenopathy palpated.  Breast Exam: Deferred.  Lungs:  Clear to auscultation bilaterally.  No crackles, rhonchi, or wheezes.  Heart:  Regular rate and rhythm.  Abdomen:  Soft, nontender, nondistended.  Positive bowel sounds.  No organomegaly or masses palpated.  Musculoskeletal:  No focal spinal tenderness to palpation.  Extremities:  Benign.  No peripheral edema or cyanosis.  Skin:  Benign.  Neuro:  Nonfocal.   Lab Results: Lab Results  Component Value Date   WBC 5.4 11/09/2011   HGB 12.5 11/09/2011   HCT 37.3 11/09/2011   MCV 97.3 11/09/2011   PLT 129* 11/09/2011   NEUTROABS 3.4 11/09/2011     Chemistry      Component Value Date/Time   NA 141 11/09/2011 1615   K 3.9 11/09/2011 1615   CL 104 11/09/2011 1615   CO2 28 11/09/2011 1615   BUN 23 11/09/2011 1615   CREATININE 0.80 11/09/2011 1615      Component Value Date/Time   CALCIUM 10.0 11/09/2011 1615   ALKPHOS 73 11/09/2011 1615   AST 16 11/09/2011 1615   ALT 14 11/09/2011 1615   BILITOT 0.3 11/09/2011 1615      CA125 elevated at 1278.9 on 11/09/11.    Impression and Plan: 70 year old [place] woman with history of ovarian cancer, dating back to July 2002, when she underwent debulking surgery for what proved to be a 4 of 18 lymph  nodes positive disease. Status post 6 cycles of paclitaxel and carboplatin, given adjuvantly and completed December 2002. First recurrence in December 2005 was treated with single agent paclitaxel. Second recurrence May 2007, treated again with carboplatin and paclitaxel. Third recurrence in January  2010 treated with topotecan and gemcitabine followed by a 2 year "break". Her fourth recurrence in January 2012 treated with single agent carboplatin until August 2012. Now with evidence of progression less than 6 months later, with platinum refractory disease. Due to initiate additional chemotherapy, consisting of single agent gemcitabine, given on a q. 2 week basis at a dose of 800 mg per meter square.   Dr. Darnelle Catalan has reviewed this case, and again his suggestion is to initiate single agent gemcitabine which the patient has tolerated very well in the past. We will plan on initiating chemotherapy next week on December 4. I will then see her 2 weeks later prior to day 1 cycle 2 on December 18 to assess her tolerance. We did review the possible side effects associated with the gemcitabine. She does have prochlorperazine on hand at home to utilize for nausea.  We will continue to follow the patient's labs closely. I will mention that her nonfasting glucose was elevated at 182 on 11/09/2011. This is a one time elevation, and we will simply recheck this at her next appointment.  This plan was reviewed with the patient, who voices understanding and agreement.  She knows to call with any changes or problems.    Haider Hornaday, PA-C 11/28/20123:40 PM

## 2011-11-17 NOTE — Telephone Encounter (Signed)
gave patient appointment for 11-2011 and 01-2012 printed out calendar and gave to the patient

## 2011-11-18 ENCOUNTER — Other Ambulatory Visit: Payer: Self-pay | Admitting: Physician Assistant

## 2011-11-18 ENCOUNTER — Telehealth: Payer: Self-pay | Admitting: Oncology

## 2011-11-18 NOTE — Telephone Encounter (Signed)
called pt and informed her of appt on 12/04 °

## 2011-11-19 ENCOUNTER — Other Ambulatory Visit: Payer: Self-pay | Admitting: Nurse Practitioner

## 2011-11-23 ENCOUNTER — Ambulatory Visit (HOSPITAL_BASED_OUTPATIENT_CLINIC_OR_DEPARTMENT_OTHER): Payer: Medicare Other

## 2011-11-23 ENCOUNTER — Other Ambulatory Visit (HOSPITAL_BASED_OUTPATIENT_CLINIC_OR_DEPARTMENT_OTHER): Payer: Medicare Other | Admitting: Lab

## 2011-11-23 VITALS — BP 126/77 | HR 70 | Temp 97.1°F

## 2011-11-23 DIAGNOSIS — C569 Malignant neoplasm of unspecified ovary: Secondary | ICD-10-CM

## 2011-11-23 DIAGNOSIS — Z5111 Encounter for antineoplastic chemotherapy: Secondary | ICD-10-CM

## 2011-11-23 LAB — CBC WITH DIFFERENTIAL/PLATELET
BASO%: 0.6 % (ref 0.0–2.0)
EOS%: 0.5 % (ref 0.0–7.0)
HCT: 38.4 % (ref 34.8–46.6)
HGB: 13 g/dL (ref 11.6–15.9)
MCH: 31.3 pg (ref 25.1–34.0)
MCHC: 33.9 g/dL (ref 31.5–36.0)
MONO#: 0.5 10*3/uL (ref 0.1–0.9)
NEUT%: 55.6 % (ref 38.4–76.8)
RDW: 13.7 % (ref 11.2–14.5)
WBC: 6.3 10*3/uL (ref 3.9–10.3)
lymph#: 2.3 10*3/uL (ref 0.9–3.3)

## 2011-11-23 MED ORDER — HEPARIN SOD (PORK) LOCK FLUSH 100 UNIT/ML IV SOLN
500.0000 [IU] | Freq: Once | INTRAVENOUS | Status: AC | PRN
  Administered 2011-11-23: 500 [IU]
  Filled 2011-11-23: qty 5

## 2011-11-23 MED ORDER — SODIUM CHLORIDE 0.9 % IJ SOLN
10.0000 mL | INTRAMUSCULAR | Status: DC | PRN
  Administered 2011-11-23: 10 mL
  Filled 2011-11-23: qty 10

## 2011-11-23 MED ORDER — PROCHLORPERAZINE MALEATE 10 MG PO TABS
10.0000 mg | ORAL_TABLET | Freq: Once | ORAL | Status: AC
  Administered 2011-11-23: 10 mg via ORAL

## 2011-11-23 MED ORDER — SODIUM CHLORIDE 0.9 % IV SOLN
800.0000 mg/m2 | Freq: Once | INTRAVENOUS | Status: AC
  Administered 2011-11-23: 1254 mg via INTRAVENOUS
  Filled 2011-11-23: qty 33

## 2011-11-23 MED ORDER — SODIUM CHLORIDE 0.9 % IV SOLN
Freq: Once | INTRAVENOUS | Status: AC
  Administered 2011-11-23: 14:00:00 via INTRAVENOUS

## 2011-11-30 ENCOUNTER — Other Ambulatory Visit: Payer: Medicare Other | Admitting: Lab

## 2011-12-07 ENCOUNTER — Telehealth: Payer: Self-pay | Admitting: Oncology

## 2011-12-07 ENCOUNTER — Ambulatory Visit (HOSPITAL_BASED_OUTPATIENT_CLINIC_OR_DEPARTMENT_OTHER): Payer: Medicare Other | Admitting: Physician Assistant

## 2011-12-07 ENCOUNTER — Ambulatory Visit (HOSPITAL_BASED_OUTPATIENT_CLINIC_OR_DEPARTMENT_OTHER): Payer: Medicare Other

## 2011-12-07 ENCOUNTER — Other Ambulatory Visit: Payer: Self-pay | Admitting: Oncology

## 2011-12-07 ENCOUNTER — Ambulatory Visit: Payer: Medicare Other | Admitting: Physician Assistant

## 2011-12-07 ENCOUNTER — Encounter: Payer: Self-pay | Admitting: Physician Assistant

## 2011-12-07 ENCOUNTER — Other Ambulatory Visit (HOSPITAL_BASED_OUTPATIENT_CLINIC_OR_DEPARTMENT_OTHER): Payer: Medicare Other | Admitting: Lab

## 2011-12-07 VITALS — BP 155/83 | HR 73 | Temp 97.8°F | Ht 59.5 in | Wt 134.9 lb

## 2011-12-07 DIAGNOSIS — C569 Malignant neoplasm of unspecified ovary: Secondary | ICD-10-CM

## 2011-12-07 DIAGNOSIS — Z5111 Encounter for antineoplastic chemotherapy: Secondary | ICD-10-CM

## 2011-12-07 LAB — COMPREHENSIVE METABOLIC PANEL
ALT: 16 U/L (ref 0–35)
AST: 16 U/L (ref 0–37)
Alkaline Phosphatase: 79 U/L (ref 39–117)
CO2: 23 mEq/L (ref 19–32)
Calcium: 9.5 mg/dL (ref 8.4–10.5)
Creatinine, Ser: 0.87 mg/dL (ref 0.50–1.10)
Glucose, Bld: 88 mg/dL (ref 70–99)
Potassium: 4 mEq/L (ref 3.5–5.3)
Sodium: 139 mEq/L (ref 135–145)
Total Bilirubin: 0.3 mg/dL (ref 0.3–1.2)

## 2011-12-07 LAB — CBC WITH DIFFERENTIAL/PLATELET
Basophils Absolute: 0 10*3/uL (ref 0.0–0.1)
HCT: 35.8 % (ref 34.8–46.6)
HGB: 12.1 g/dL (ref 11.6–15.9)
MONO#: 0.5 10*3/uL (ref 0.1–0.9)
NEUT%: 55.7 % (ref 38.4–76.8)
WBC: 5.6 10*3/uL (ref 3.9–10.3)
lymph#: 1.9 10*3/uL (ref 0.9–3.3)

## 2011-12-07 MED ORDER — SODIUM CHLORIDE 0.9 % IV SOLN
Freq: Once | INTRAVENOUS | Status: AC
  Administered 2011-12-07: 16:00:00 via INTRAVENOUS

## 2011-12-07 MED ORDER — SODIUM CHLORIDE 0.9 % IJ SOLN
10.0000 mL | INTRAMUSCULAR | Status: DC | PRN
  Administered 2011-12-07: 10 mL
  Filled 2011-12-07: qty 10

## 2011-12-07 MED ORDER — SODIUM CHLORIDE 0.9 % IV SOLN
800.0000 mg/m2 | Freq: Once | INTRAVENOUS | Status: AC
  Administered 2011-12-07: 1254 mg via INTRAVENOUS
  Filled 2011-12-07: qty 33

## 2011-12-07 MED ORDER — HEPARIN SOD (PORK) LOCK FLUSH 100 UNIT/ML IV SOLN
500.0000 [IU] | Freq: Once | INTRAVENOUS | Status: AC | PRN
  Administered 2011-12-07: 500 [IU]
  Filled 2011-12-07: qty 5

## 2011-12-07 MED ORDER — ALTEPLASE 2 MG IJ SOLR
2.0000 mg | Freq: Once | INTRAMUSCULAR | Status: AC | PRN
  Administered 2011-12-07: 2 mg
  Filled 2011-12-07: qty 2

## 2011-12-07 MED ORDER — PROCHLORPERAZINE MALEATE 10 MG PO TABS
10.0000 mg | ORAL_TABLET | Freq: Once | ORAL | Status: AC
  Administered 2011-12-07: 10 mg via ORAL

## 2011-12-07 NOTE — Progress Notes (Signed)
Hematology and Oncology Follow Up Visit  Cassandra Nichols [id number] May 30, 1941 70 y.o. 12/07/2011 3:17 PM  HPI: Cassandra Nichols is a 70 year old [place] woman who was diagnosed with ovarian carcinoma in 2002. She underwent debulking surgery in July 2002 for what proved to be a 4 of 18 lymph nodes positive disease. She received 6 cycles of paclitaxel and carboplatin adjuvantly, completed in December 2002.  First recurrence occurred in December 2005. The patient was treated with single agent paclitaxel.  Second recurrence in May of 2007 was treated with combination of carboplatin and paclitaxel.  Third recurrence in January 2010 was treated with topotecan and gemcitabine. This was followed by a 2 year "break" with disease stability.  A fourth recurrence occurred in January of 2012, treated with single agent carboplatin until August of 2012. There is evidence of progression less than 6 months later, now with platinum refractory disease.  Now receiving single agent gemcitabine on a every 2 week basis at a dose of 800 mg per meter square.   Interim History:   Patient returns today for followup of recurrent ovarian carcinoma. There has been a recent increase in CA125, most recent CA125 of 1278.9 on November 20. This is up from 984.5 in early October and 826.9 in early August of this year. Otherwise, fortunately, the patient is asymptomatic from a clinical standpoint. She is now being treated with gemcitabine on a every 2 week basis and is due for day 1 cycle 2 today.  The patient is feeling well today. She was able to travel to [place] last week to visit her son and his family. She enjoyed spending time with her grandchildren. She is a little disappointed that her daughter, [name], is not going to make get to [place] for Christmas. She tells me that last couple of weeks have been an "emotional roller coaster" but that physically she is doing quite well.  She tolerated her first dose of gemcitabine  very well 2 weeks ago and with the exception of some mild queasiness, noted no side effects. She took for Compazine appropriately and had no emesis. She continues to deny any signs of peripheral neuropathy. She's having regular bowel movements, neither diarrhea or constipation. No abdominal or pelvic pain. No signs of abnormal bleeding.  A detailed review of systems is otherwise noncontributory as noted below.  Review of Systems: Constitutional:  no weight loss, fever, night sweats and feels well Eyes: negative [id number] Cardiovascular: no chest pain or dyspnea on exertion Respiratory: no cough, shortness of breath, or wheezing Neurological: negative Dermatological: negative Gastrointestinal: no abdominal pain, change in bowel habits, or black or bloody stools Genito-Urinary: no dysuria, trouble voiding, or hematuria Hematological and Lymphatic: negative Breast: negative Musculoskeletal: negative Remaining ROS negative.  Medications:  I have reviewed the patient's current medications.  Allergies:  Allergies  Allergen Reactions  . Penicillins Rash     Physical Exam: Filed Vitals:   12/07/11 1459  BP: 155/83  Pulse: 73  Temp: 97.8 F (36.6 C)   HEENT:  Sclerae anicteric, conjunctivae pink.  Oropharynx clear.  No mucositis or candidiasis.   Nodes:  No cervical, supraclavicular, or axillary lymphadenopathy palpated.  Breast Exam:  Deferred   Lungs:  Clear to auscultation bilaterally.  No crackles, rhonchi, or wheezes.   Heart:  Regular rate and rhythm.  No murmur or rubs. Abdomen:  Soft, nontender.  Positive bowel sounds.  No organomegaly or masses palpated.   Musculoskeletal:  No focal spinal tenderness to palpation.  Extremities:  Benign.  No peripheral edema or cyanosis.   Skin:  Dry but otherwise benign.   Neuro:  Nonfocal.    Lab Results: Lab Results  Component Value Date   WBC 5.6 12/07/2011   HGB 12.1 12/07/2011   HCT 35.8 12/07/2011   MCV 92.7 12/07/2011    PLT 104* 12/07/2011   NEUTROABS 3.1 12/07/2011     Chemistry      Component Value Date/Time   NA 141 11/09/2011 1615   K 3.9 11/09/2011 1615   CL 104 11/09/2011 1615   CO2 28 11/09/2011 1615   BUN 23 11/09/2011 1615   CREATININE 0.80 11/09/2011 1615      Component Value Date/Time   CALCIUM 10.0 11/09/2011 1615   ALKPHOS 73 11/09/2011 1615   AST 16 11/09/2011 1615   ALT 14 11/09/2011 1615   BILITOT 0.3 11/09/2011 1615      CA125 elevated at 1278.9 on 11/09/11.  A repeat CMET and CA125 are pending today. In addition to the tumor marker, we will be watching for the glucose results from today's labs. (Recall that glucose was elevated at 182 on 11/09/2011, a one time elevation.)    Impression: 70 year old [place] woman with history of ovarian cancer, dating back to July 2002, when she underwent debulking surgery for what proved to be a 4 of 18 lymph nodes positive disease. Status post 6 cycles of paclitaxel and carboplatin, given adjuvantly and completed December 2002. First recurrence in December 2005 was treated with single agent paclitaxel. Second recurrence May 2007, treated again with carboplatin and paclitaxel. Third recurrence in January 2010 treated with topotecan and gemcitabine followed by a 2 year "break". Her fourth recurrence in January 2012 treated with single agent carboplatin until August 2012. Now with evidence of progression less than 6 months later, with platinum refractory disease. Due to initiate additional chemotherapy, consisting of single agent gemcitabine, given on a q. 2 week basis at a dose of 800 mg per meter square.   Plan: The patient will proceed to treatment today as scheduled for day 1 cycle 2 of single agent gemcitabine. She return in 2 weeks for followup with Dr. Darnelle Catalan on January 2 prior to her third dose. She will receive treatment again on January 15 and I was see her on January 29 for physical exam. In the meanwhile, we will continue to follow  her labs very closely.    This plan was reviewed with the patient, who voices understanding and agreement.  She knows to call with any changes or problems.    Lurena Naeve, PA-C 12/18/20123:17 PM

## 2011-12-07 NOTE — Telephone Encounter (Signed)
Gv pt appt for jan-feb2013 °

## 2011-12-22 ENCOUNTER — Ambulatory Visit (HOSPITAL_BASED_OUTPATIENT_CLINIC_OR_DEPARTMENT_OTHER): Payer: Medicare Other

## 2011-12-22 ENCOUNTER — Other Ambulatory Visit: Payer: Medicare Other | Admitting: Lab

## 2011-12-22 ENCOUNTER — Ambulatory Visit (HOSPITAL_BASED_OUTPATIENT_CLINIC_OR_DEPARTMENT_OTHER): Payer: Medicare Other | Admitting: Physician Assistant

## 2011-12-22 ENCOUNTER — Encounter: Payer: Self-pay | Admitting: Physician Assistant

## 2011-12-22 ENCOUNTER — Ambulatory Visit: Payer: Medicare Other | Admitting: Physician Assistant

## 2011-12-22 ENCOUNTER — Telehealth: Payer: Self-pay | Admitting: Oncology

## 2011-12-22 ENCOUNTER — Ambulatory Visit: Payer: Medicare Other | Admitting: Oncology

## 2011-12-22 VITALS — BP 128/76 | HR 80 | Temp 98.3°F | Ht 59.5 in | Wt 134.9 lb

## 2011-12-22 DIAGNOSIS — C569 Malignant neoplasm of unspecified ovary: Secondary | ICD-10-CM

## 2011-12-22 DIAGNOSIS — Z5111 Encounter for antineoplastic chemotherapy: Secondary | ICD-10-CM

## 2011-12-22 LAB — COMPREHENSIVE METABOLIC PANEL
ALT: 27 U/L (ref 0–35)
Alkaline Phosphatase: 78 U/L (ref 39–117)
Creatinine, Ser: 0.88 mg/dL (ref 0.50–1.10)
Glucose, Bld: 91 mg/dL (ref 70–99)
Sodium: 140 mEq/L (ref 135–145)
Total Bilirubin: 0.4 mg/dL (ref 0.3–1.2)
Total Protein: 6.7 g/dL (ref 6.0–8.3)

## 2011-12-22 LAB — CBC WITH DIFFERENTIAL/PLATELET
BASO%: 0.4 % (ref 0.0–2.0)
EOS%: 0.2 % (ref 0.0–7.0)
MCH: 31.4 pg (ref 25.1–34.0)
MCHC: 33.8 g/dL (ref 31.5–36.0)
MCV: 92.9 fL (ref 79.5–101.0)
MONO%: 9 % (ref 0.0–14.0)
RDW: 14.9 % — ABNORMAL HIGH (ref 11.2–14.5)
lymph#: 1.8 10*3/uL (ref 0.9–3.3)

## 2011-12-22 MED ORDER — SODIUM CHLORIDE 0.9 % IV SOLN
800.0000 mg/m2 | Freq: Once | INTRAVENOUS | Status: AC
  Administered 2011-12-22: 1254 mg via INTRAVENOUS
  Filled 2011-12-22: qty 33

## 2011-12-22 MED ORDER — SODIUM CHLORIDE 0.9 % IV SOLN
Freq: Once | INTRAVENOUS | Status: AC
  Administered 2011-12-22: 15:00:00 via INTRAVENOUS

## 2011-12-22 MED ORDER — PROCHLORPERAZINE MALEATE 10 MG PO TABS
10.0000 mg | ORAL_TABLET | Freq: Once | ORAL | Status: AC
  Administered 2011-12-22: 10 mg via ORAL

## 2011-12-22 NOTE — Progress Notes (Signed)
Hematology and Oncology Follow Up Visit  Cassandra Nichols [id number] 09-Dec-1941 71 y.o. 12/22/2011 1:53 PM  HPI: Cassandra Nichols is a 71 year old [place] woman who was diagnosed with ovarian carcinoma in 2002. She underwent debulking surgery in July 2002 for what proved to be a 4 of 18 lymph nodes positive disease. She received 6 cycles of paclitaxel and carboplatin adjuvantly, completed in December 2002.  First recurrence occurred in December 2005. The patient was treated with single agent paclitaxel.  Second recurrence in May of 2007 was treated with combination of carboplatin and paclitaxel.  Third recurrence in January 2010 was treated with topotecan and gemcitabine. This was followed by a 2 year "break" with disease stability.  A fourth recurrence occurred in January of 2012, treated with single agent carboplatin until August of 2012. There is evidence of progression less than 6 months later, now with platinum refractory disease.  Now receiving single agent gemcitabine on a every 2 week basis at a dose of 800 mg per meter square.   Interim History:   Patient returns today for followup of recurrent ovarian carcinoma. There had been a recent increase in CA125,  1278.9 on November 20. This is up from 984.5 in early October and 826.9 in early August of this year. Otherwise, fortunately, the patient is asymptomatic from a clinical standpoint. She is now being treated with gemcitabine on a every 2 week basis and is due for day 1 cycle 3 today.  Cassandra Nichols continues to tolerate treatment very well, with no significant side effects at all. Her energy level is good. She continues to have some mild queasiness, treated very well with Compazine, with no emesis. She's had no increased peripheral neuropathy. No fevers, chills, or night sweats. She did feel a little weak and tired for couple days following treatment, but this resolved, and again her energy level is back to normal today.  A detailed review of systems  is otherwise noncontributory as noted below.  Review of Systems: Constitutional:  no weight loss, fever, night sweats and feels well Eyes: negative [id number] Cardiovascular: no chest pain or dyspnea on exertion Respiratory: no cough, shortness of breath, or wheezing Neurological: negative Dermatological: negative Gastrointestinal: mild nausea, no emesis, no abdominal pain, change in bowel habits, or black or bloody stools Genito-Urinary: no dysuria, trouble voiding, or hematuria Hematological and Lymphatic: negative Breast: negative Musculoskeletal: negative Remaining ROS negative.  Medications:  I have reviewed the patient's current medications.  Allergies:  Allergies  Allergen Reactions  . Penicillins Rash     Physical Exam: Filed Vitals:   12/22/11 1334  BP: 128/76  Pulse: 80  Temp: 98.3 F (36.8 C)   HEENT:  Sclerae anicteric, conjunctivae pink.  Oropharynx clear.  No mucositis or candidiasis.   Nodes:  No cervical, supraclavicular, or axillary lymphadenopathy palpated.  Breast Exam:  Deferred   Lungs:  Clear to auscultation bilaterally.  No crackles, rhonchi, or wheezes.   Heart:  Regular rate and rhythm.  No murmur or rubs. Abdomen:  Soft, nontender.  Positive bowel sounds.  No organomegaly or masses palpated.   Musculoskeletal:  No focal spinal tenderness to palpation.  Extremities:  Benign.  No peripheral edema or cyanosis.   Skin:  Dry but otherwise benign.   Neuro:  Nonfocal.    Lab Results: Lab Results  Component Value Date   WBC 5.5 12/22/2011   HGB 12.4 12/22/2011   HCT 36.7 12/22/2011   MCV 92.9 12/22/2011   PLT 149 12/22/2011  NEUTROABS 3.2 12/22/2011     Chemistry      Component Value Date/Time   NA 139 12/07/2011 1435   K 4.0 12/07/2011 1435   CL 104 12/07/2011 1435   CO2 23 12/07/2011 1435   BUN 25* 12/07/2011 1435   CREATININE 0.87 12/07/2011 1435      Component Value Date/Time   CALCIUM 9.5 12/07/2011 1435   ALKPHOS 79 12/07/2011 1435     AST 16 12/07/2011 1435   ALT 16 12/07/2011 1435   BILITOT 0.3 12/07/2011 1435      CA125 elevated at 1278.9 on 11/09/11.  Down to 1077.9 on 12/07/11, with today's results pending.   Impression: 71 year old [place] woman with history of ovarian cancer, dating back to July 2002, when she underwent debulking surgery for what proved to be a 4 of 18 lymph nodes positive disease.   1.  Status post 6 cycles of paclitaxel and carboplatin, given adjuvantly and completed December 2002.   2.  First recurrence in December 2005 was treated with single agent paclitaxel. Second recurrence May 2007, treated again with carboplatin and paclitaxel.   3.  Third recurrence in January 2010 treated with topotecan and gemcitabine followed by a 2 year "break".   4.  Her fourth recurrence in January 2012 treated with single agent carboplatin until August 2012.  5.   Now with evidence of progression less than 6 months later, with platinum refractory disease. Currently being treated with single agent gemcitabine, given on a q. 2 week basis at a dose of 800 mg per meter square.   Plan: Cassandra Nichols will proceed to treatment today as scheduled for day 1 cycle 3 of single agent gemcitabine. She returned for treatment one in 2 weeks (resuming her normal Tuesday appointments) and we'll see Cassandra Nichols in 4 weeks. We will continue to follow her very closely, but Cassandra Nichols seems to be doing quite well.  This plan was reviewed with the patient, who voices understanding and agreement.  She knows to call with any changes or problems.    Cassandra Rill, PA-C 1/2/20131:53 PM

## 2011-12-22 NOTE — Patient Instructions (Signed)
Patient ambulatory out of clinic.  Instructed patient to call with any issues.  Patient aware of next appointment 

## 2011-12-22 NOTE — Telephone Encounter (Signed)
Gv pt appt for jan-feb2013 °

## 2011-12-23 LAB — CA 125: CA 125: 1051 U/mL — ABNORMAL HIGH (ref 0.0–30.2)

## 2012-01-04 ENCOUNTER — Other Ambulatory Visit (HOSPITAL_BASED_OUTPATIENT_CLINIC_OR_DEPARTMENT_OTHER): Payer: Medicare Other | Admitting: Lab

## 2012-01-04 ENCOUNTER — Ambulatory Visit (HOSPITAL_BASED_OUTPATIENT_CLINIC_OR_DEPARTMENT_OTHER): Payer: Medicare Other

## 2012-01-04 DIAGNOSIS — C569 Malignant neoplasm of unspecified ovary: Secondary | ICD-10-CM

## 2012-01-04 DIAGNOSIS — Z5111 Encounter for antineoplastic chemotherapy: Secondary | ICD-10-CM

## 2012-01-04 LAB — COMPREHENSIVE METABOLIC PANEL
ALT: 27 U/L (ref 0–35)
AST: 24 U/L (ref 0–37)
Albumin: 3.7 g/dL (ref 3.5–5.2)
Alkaline Phosphatase: 75 U/L (ref 39–117)
Calcium: 9.1 mg/dL (ref 8.4–10.5)
Chloride: 107 mEq/L (ref 96–112)
Potassium: 3.8 mEq/L (ref 3.5–5.3)
Sodium: 141 mEq/L (ref 135–145)
Total Protein: 6.7 g/dL (ref 6.0–8.3)

## 2012-01-04 LAB — CBC WITH DIFFERENTIAL/PLATELET
BASO%: 0.4 % (ref 0.0–2.0)
HCT: 37 % (ref 34.8–46.6)
MCHC: 33.8 g/dL (ref 31.5–36.0)
MONO#: 0.5 10*3/uL (ref 0.1–0.9)
NEUT%: 58.8 % (ref 38.4–76.8)
RBC: 3.98 10*6/uL (ref 3.70–5.45)
RDW: 15.6 % — ABNORMAL HIGH (ref 11.2–14.5)
WBC: 5.3 10*3/uL (ref 3.9–10.3)
lymph#: 1.6 10*3/uL (ref 0.9–3.3)
nRBC: 0 % (ref 0–0)

## 2012-01-04 LAB — CA 125: CA 125: 1064.8 U/mL — ABNORMAL HIGH (ref 0.0–30.2)

## 2012-01-04 MED ORDER — SODIUM CHLORIDE 0.9 % IV SOLN
Freq: Once | INTRAVENOUS | Status: AC
  Administered 2012-01-04: 15:00:00 via INTRAVENOUS

## 2012-01-04 MED ORDER — HEPARIN SOD (PORK) LOCK FLUSH 100 UNIT/ML IV SOLN
500.0000 [IU] | Freq: Once | INTRAVENOUS | Status: AC | PRN
  Administered 2012-01-04: 500 [IU]
  Filled 2012-01-04: qty 5

## 2012-01-04 MED ORDER — PROCHLORPERAZINE MALEATE 10 MG PO TABS
10.0000 mg | ORAL_TABLET | Freq: Once | ORAL | Status: AC
  Administered 2012-01-04: 10 mg via ORAL

## 2012-01-04 MED ORDER — SODIUM CHLORIDE 0.9 % IV SOLN
800.0000 mg/m2 | Freq: Once | INTRAVENOUS | Status: AC
  Administered 2012-01-04: 1254 mg via INTRAVENOUS
  Filled 2012-01-04: qty 33

## 2012-01-04 MED ORDER — SODIUM CHLORIDE 0.9 % IJ SOLN
10.0000 mL | INTRAMUSCULAR | Status: DC | PRN
  Administered 2012-01-04: 10 mL
  Filled 2012-01-04: qty 10

## 2012-01-07 ENCOUNTER — Other Ambulatory Visit: Payer: Self-pay | Admitting: Certified Registered Nurse Anesthetist

## 2012-01-18 ENCOUNTER — Other Ambulatory Visit: Payer: Medicare Other | Admitting: Lab

## 2012-01-18 ENCOUNTER — Ambulatory Visit: Payer: Medicare Other

## 2012-01-18 ENCOUNTER — Other Ambulatory Visit (HOSPITAL_BASED_OUTPATIENT_CLINIC_OR_DEPARTMENT_OTHER): Payer: Medicare Other | Admitting: Lab

## 2012-01-18 ENCOUNTER — Ambulatory Visit (HOSPITAL_BASED_OUTPATIENT_CLINIC_OR_DEPARTMENT_OTHER): Payer: Medicare Other | Admitting: Oncology

## 2012-01-18 ENCOUNTER — Ambulatory Visit (HOSPITAL_BASED_OUTPATIENT_CLINIC_OR_DEPARTMENT_OTHER): Payer: Medicare Other

## 2012-01-18 ENCOUNTER — Ambulatory Visit: Payer: Medicare Other | Admitting: Physician Assistant

## 2012-01-18 ENCOUNTER — Other Ambulatory Visit: Payer: Self-pay | Admitting: Oncology

## 2012-01-18 VITALS — BP 142/81 | HR 77 | Temp 98.1°F | Ht 59.5 in | Wt 132.3 lb

## 2012-01-18 DIAGNOSIS — C569 Malignant neoplasm of unspecified ovary: Secondary | ICD-10-CM

## 2012-01-18 DIAGNOSIS — N63 Unspecified lump in unspecified breast: Secondary | ICD-10-CM

## 2012-01-18 DIAGNOSIS — Z5111 Encounter for antineoplastic chemotherapy: Secondary | ICD-10-CM

## 2012-01-18 LAB — COMPREHENSIVE METABOLIC PANEL
ALT: 18 U/L (ref 0–35)
AST: 17 U/L (ref 0–37)
Albumin: 3.8 g/dL (ref 3.5–5.2)
CO2: 26 mEq/L (ref 19–32)
Calcium: 9.6 mg/dL (ref 8.4–10.5)
Chloride: 103 mEq/L (ref 96–112)
Creatinine, Ser: 0.81 mg/dL (ref 0.50–1.10)
Potassium: 3.7 mEq/L (ref 3.5–5.3)
Total Protein: 7 g/dL (ref 6.0–8.3)

## 2012-01-18 LAB — CBC WITH DIFFERENTIAL/PLATELET
BASO%: 0.8 % (ref 0.0–2.0)
HCT: 36 % (ref 34.8–46.6)
HGB: 12.3 g/dL (ref 11.6–15.9)
MCHC: 34.1 g/dL (ref 31.5–36.0)
MONO#: 0.5 10*3/uL (ref 0.1–0.9)
NEUT%: 65.2 % (ref 38.4–76.8)
RDW: 17.6 % — ABNORMAL HIGH (ref 11.2–14.5)
WBC: 5.8 10*3/uL (ref 3.9–10.3)
lymph#: 1.5 10*3/uL (ref 0.9–3.3)

## 2012-01-18 MED ORDER — HEPARIN SOD (PORK) LOCK FLUSH 100 UNIT/ML IV SOLN
500.0000 [IU] | Freq: Once | INTRAVENOUS | Status: AC | PRN
  Administered 2012-01-18: 500 [IU]
  Filled 2012-01-18: qty 5

## 2012-01-18 MED ORDER — PROCHLORPERAZINE MALEATE 10 MG PO TABS
10.0000 mg | ORAL_TABLET | Freq: Once | ORAL | Status: AC
  Administered 2012-01-18: 10 mg via ORAL

## 2012-01-18 MED ORDER — SODIUM CHLORIDE 0.9 % IV SOLN
800.0000 mg/m2 | Freq: Once | INTRAVENOUS | Status: AC
  Administered 2012-01-18: 1254 mg via INTRAVENOUS
  Filled 2012-01-18: qty 33

## 2012-01-18 MED ORDER — SODIUM CHLORIDE 0.9 % IJ SOLN
10.0000 mL | INTRAMUSCULAR | Status: DC | PRN
  Administered 2012-01-18: 10 mL
  Filled 2012-01-18: qty 10

## 2012-01-18 MED ORDER — SODIUM CHLORIDE 0.9 % IV SOLN
Freq: Once | INTRAVENOUS | Status: AC
  Administered 2012-01-18: 16:00:00 via INTRAVENOUS

## 2012-01-18 NOTE — Progress Notes (Signed)
Hematology and Oncology Follow Up Visit  Cassandra Nichols [id number] 28-Oct-1941 71 y.o. 01/18/2012 2:58 PM  HPI: Cassandra Nichols is a 71 year old [place] woman who was diagnosed with ovarian carcinoma in 2002. She underwent debulking surgery in July 2002 for what proved to be a 4 of 18 lymph nodes positive disease. She received 6 cycles of paclitaxel and carboplatin adjuvantly, completed in December 2002.  First recurrence occurred in December 2005. The patient was treated with single agent paclitaxel.  Second recurrence in May of 2007 was treated with combination of carboplatin and paclitaxel.  Third recurrence in January 2010 was treated with topotecan and gemcitabine. This was followed by a 2 year "break" with disease stability.  A fourth recurrence occurred in January of 2012, treated with single agent carboplatin until August of 2012. There is evidence of progression less than 6 months later, now with platinum refractory disease.  Now receiving single agent gemcitabine on a every 2 week basis at a dose of 800 mg per meter square.   Interim History:   The patient returns today for followup of her ovarian cancer. The interval history is generally stable. Her daughter in [place] unfortunately is getting divorced. On the plus side the patient's son in [place] is getting married. All the grandchildren and seem to be doing well. She is thinking of 70 at least part-time in elementary school.  Review of Systems: She is tolerating chemotherapy generally well. On day 2 she takes quite a bit of Compazine and a "knocks her out" and otherwise she gets a bit of nausea. I am concerned she is taking too much Compazine on that day and this is discussed further below. Otherwise she is moderately fatigued A detailed review of systems is otherwise stable  Medications:  I have reviewed the patient's current medications.  Allergies:  Allergies  Allergen Reactions  . Penicillins Rash     Physical  Exam: Filed Vitals:   01/18/12 1441  BP: 142/81  Pulse: 77  Temp: 98.1 F (36.7 C)   HEENT:  Sclerae anicteric, conjunctivae pink.  Oropharynx clear.  No mucositis or candidiasis.   Nodes:  No cervical, supraclavicular, or axillary lymphadenopathy palpated.  Breast Exam:  I do not palpate any masses in her right breast. The left breast is unremarkable Lungs:  Clear to auscultation bilaterally.  No crackles, rhonchi, or wheezes.   Heart:  Regular rate and rhythm.  No murmur or rubs. Abdomen:  Soft, nontender.  Positive bowel sounds.  No organomegaly or masses palpated.   Musculoskeletal:  No focal spinal tenderness to palpation.  Extremities:  Benign.  No peripheral edema or cyanosis.   Skin:  Dry but otherwise benign.   Neuro:  Nonfocal.    Lab Results: Her CA 125 is stable, the last 4 numbers being 1279, 1078, 1051, and 1065  Lab Results  Component Value Date   WBC 5.8 01/18/2012   HGB 12.3 01/18/2012   HCT 36.0 01/18/2012   MCV 95.2 01/18/2012   PLT 144* 01/18/2012   NEUTROABS 3.8 01/18/2012     Chemistry      Component Value Date/Time   NA 141 01/04/2012 1437   K 3.8 01/04/2012 1437   CL 107 01/04/2012 1437   CO2 23 01/04/2012 1437   BUN 19 01/04/2012 1437   CREATININE 0.91 01/04/2012 1437      Component Value Date/Time   CALCIUM 9.1 01/04/2012 1437   ALKPHOS 75 01/04/2012 1437   AST 24 01/04/2012 1437  ALT 27 01/04/2012 1437   BILITOT 0.3 01/04/2012 1437      CA125 elevated at 1278.9 on 11/09/11.  Down to 1077.9 on 12/07/11, with today's results pending.   Impression: 71 year old [place] woman with history of ovarian cancer, dating back to July 2002, when she underwent debulking surgery for what proved to be a 4 of 18 lymph nodes positive disease.   1.  Status post 6 cycles of paclitaxel and carboplatin, given adjuvantly and completed December 2002.   2.  First recurrence in December 2005 was treated with single agent paclitaxel. Second recurrence May 2007, treated  again with carboplatin and paclitaxel.   3.  Third recurrence in January 2010 treated with topotecan and gemcitabine followed by a 2 year "break".   4.  Her fourth recurrence in January 2012 treated with single agent carboplatin until August 2012.  5.   Now with evidence of progression less than 6 months later, with platinum refractory disease. Currently being treated with single agent gemcitabine, given on a q. 2 week basis at a dose of 800 mg per meter square.   Plan: We are continuing with the every two-week gemcitabine as before. I have added dexamethasone 8 mg to take in the morning of day 2, with breakfast. I have asked her not to take the Compazine anymore frequently than every 6 hours on day 2. Otherwise she is tolerating treatment well. She is due for mammography and I am setting that up through the [facility] in the near future. She will continue her treatments every 2 weeks and she will see me again in mid April. She knows to call for any problems that may develop before then  Eligah Anello C, PA-C 1/29/20132:58 PM

## 2012-01-19 ENCOUNTER — Other Ambulatory Visit: Payer: Self-pay | Admitting: Oncology

## 2012-01-19 DIAGNOSIS — N63 Unspecified lump in unspecified breast: Secondary | ICD-10-CM

## 2012-01-19 LAB — CA 125: CA 125: 1118 U/mL — ABNORMAL HIGH (ref 0.0–30.2)

## 2012-01-26 ENCOUNTER — Ambulatory Visit
Admission: RE | Admit: 2012-01-26 | Discharge: 2012-01-26 | Disposition: A | Discharge status: Home / Self Care | Payer: Medicare Other | Source: Ambulatory Visit | Attending: Oncology | Admitting: Oncology

## 2012-01-26 DIAGNOSIS — N63 Unspecified lump in unspecified breast: Secondary | ICD-10-CM

## 2012-01-26 DIAGNOSIS — Z8543 Personal history of malignant neoplasm of ovary: Secondary | ICD-10-CM

## 2012-01-31 ENCOUNTER — Other Ambulatory Visit: Payer: Self-pay | Admitting: Oncology

## 2012-02-01 ENCOUNTER — Ambulatory Visit (HOSPITAL_BASED_OUTPATIENT_CLINIC_OR_DEPARTMENT_OTHER): Payer: Medicare Other | Admitting: Physician Assistant

## 2012-02-01 ENCOUNTER — Telehealth: Payer: Self-pay | Admitting: Oncology

## 2012-02-01 ENCOUNTER — Ambulatory Visit (HOSPITAL_BASED_OUTPATIENT_CLINIC_OR_DEPARTMENT_OTHER): Payer: Medicare Other

## 2012-02-01 ENCOUNTER — Encounter: Payer: Self-pay | Admitting: Physician Assistant

## 2012-02-01 ENCOUNTER — Other Ambulatory Visit: Payer: Medicare Other | Admitting: Lab

## 2012-02-01 VITALS — BP 151/78 | HR 91 | Temp 98.7°F | Ht 59.5 in | Wt 130.1 lb

## 2012-02-01 DIAGNOSIS — C569 Malignant neoplasm of unspecified ovary: Secondary | ICD-10-CM

## 2012-02-01 DIAGNOSIS — Z5111 Encounter for antineoplastic chemotherapy: Secondary | ICD-10-CM

## 2012-02-01 LAB — COMPREHENSIVE METABOLIC PANEL
AST: 16 U/L (ref 0–37)
Alkaline Phosphatase: 68 U/L (ref 39–117)
BUN: 14 mg/dL (ref 6–23)
Calcium: 9.6 mg/dL (ref 8.4–10.5)
Creatinine, Ser: 0.8 mg/dL (ref 0.50–1.10)

## 2012-02-01 LAB — CBC WITH DIFFERENTIAL/PLATELET
Basophils Absolute: 0.1 10*3/uL (ref 0.0–0.1)
Eosinophils Absolute: 0.1 10*3/uL (ref 0.0–0.5)
HGB: 11.8 g/dL (ref 11.6–15.9)
MCV: 92.8 fL (ref 79.5–101.0)
MONO#: 0.5 10*3/uL (ref 0.1–0.9)
MONO%: 8.3 % (ref 0.0–14.0)
NEUT#: 4.4 10*3/uL (ref 1.5–6.5)
RBC: 3.74 10*6/uL (ref 3.70–5.45)
RDW: 16.6 % — ABNORMAL HIGH (ref 11.2–14.5)
WBC: 6.3 10*3/uL (ref 3.9–10.3)
lymph#: 1.2 10*3/uL (ref 0.9–3.3)

## 2012-02-01 LAB — CA 125: CA 125: 939 U/mL — ABNORMAL HIGH (ref 0.0–30.2)

## 2012-02-01 MED ORDER — HEPARIN SOD (PORK) LOCK FLUSH 100 UNIT/ML IV SOLN
250.0000 [IU] | Freq: Once | INTRAVENOUS | Status: AC | PRN
  Administered 2012-02-01: 500 [IU]
  Filled 2012-02-01: qty 5

## 2012-02-01 MED ORDER — SODIUM CHLORIDE 0.9 % IJ SOLN
10.0000 mL | INTRAMUSCULAR | Status: DC | PRN
  Administered 2012-02-01: 10 mL
  Filled 2012-02-01: qty 10

## 2012-02-01 MED ORDER — PROCHLORPERAZINE MALEATE 10 MG PO TABS
10.0000 mg | ORAL_TABLET | Freq: Once | ORAL | Status: AC
  Administered 2012-02-01: 10 mg via ORAL

## 2012-02-01 MED ORDER — SODIUM CHLORIDE 0.9 % IV SOLN
Freq: Once | INTRAVENOUS | Status: AC
  Administered 2012-02-01: 16:00:00 via INTRAVENOUS

## 2012-02-01 MED ORDER — SODIUM CHLORIDE 0.9 % IV SOLN
800.0000 mg/m2 | Freq: Once | INTRAVENOUS | Status: AC
  Administered 2012-02-01: 1254 mg via INTRAVENOUS
  Filled 2012-02-01: qty 33

## 2012-02-01 NOTE — Telephone Encounter (Signed)
gve the pt her revised feb,march 2013 appt calendar

## 2012-02-01 NOTE — Progress Notes (Signed)
Hematology and Oncology Follow Up Visit  Cassandra Nichols [id number] 11/24/1941 71 y.o. 02/01/2012 3:09 PM  HPI: Cassandra Nichols is a 71 year old [place] woman who was diagnosed with ovarian carcinoma in 2002. She underwent debulking surgery in July 2002 for what proved to be a 4 of 18 lymph nodes positive disease. She received 6 cycles of paclitaxel and carboplatin adjuvantly, completed in December 2002.  First recurrence occurred in December 2005. The patient was treated with single agent paclitaxel.  Second recurrence in May of 2007 was treated with combination of carboplatin and paclitaxel.  Third recurrence in January 2010 was treated with topotecan and gemcitabine. This was followed by a 2 year "break" with disease stability.  A fourth recurrence occurred in January of 2012, treated with single agent carboplatin until August of 2012. There is evidence of progression less than 6 months later, now with platinum refractory disease.  Now receiving single agent gemcitabine on a every 2 week basis at a dose of 800 mg per meter square.   Interim History:   Cassandra Nichols returns today for followup of her ovarian cancer, due for day 1 cycle 6 of gemcitabine today, given on a every 2 week basis. Cassandra Nichols tolerated her last dose of gemcitabine well. She took less Compazine, and took 8 mg of dexamethasone on the morning of day 2. She had less nausea, and no emesis. She plans to follow the same regimen with cycle 6.  Otherwise, in fact, Cassandra Nichols has no additional problems or complaints today. Her energy level is good. She's had no change in bowel habits. No abdominal pain. No signs of abnormal bleeding. No abnormal headaches or dizziness, and no pain elsewhere.   A detailed review of systems is otherwise noncontributory as noted below.  Only noted the fact that Cassandra Nichols anticipates a trip to [place] in the next several weeks to help take care of her in. She is also asking that we see her for office visits with every  other dose of chemotherapy to lessen the number of appointments.  Review of Systems: Constitutional:  no weight loss, fever, night sweats and feels well Eyes: negative [id number] Cardiovascular: no chest pain or dyspnea on exertion Respiratory: no cough, shortness of breath, or wheezing Neurological: negative Dermatological: negative Gastrointestinal: no abdominal pain, nausea, change in bowel habits, or black or bloody stools Genito-Urinary: no dysuria, trouble voiding, or hematuria Hematological and Lymphatic: negative Breast: negative Musculoskeletal: negative Remaining ROS negative.    Medications:  I have reviewed the patient's current medications.  Current Outpatient Prescriptions on File Prior to Visit  Medication Sig Dispense Refill  . ibuprofen (ADVIL,MOTRIN) 800 MG tablet Take 800 mg by mouth every 8 (eight) hours as needed.        . lidocaine-prilocaine (EMLA) cream Apply 1 application topically as needed.        . prochlorperazine (COMPAZINE) 10 MG tablet Take 10 mg by mouth every 6 (six) hours as needed.         Current Facility-Administered Medications on File Prior to Visit  Medication Dose Route Frequency Provider Last Rate Last Dose  . sodium chloride 0.9 % injection 10 mL  10 mL Intracatheter PRN Cassandra Scale, PA   10 mL at 01/04/12 1625     Allergies:  Allergies  Allergen Reactions  . Penicillins Rash     Physical Exam: Filed Vitals:   02/01/12 1444  BP: 151/78  Pulse: 91  Temp: 98.7 F (37.1 C)   HEENT:  Sclerae anicteric, conjunctivae pink.  Oropharynx clear.  No mucositis or candidiasis.   Nodes:  No cervical, supraclavicular, or axillary lymphadenopathy palpated.  Breast Exam:  Deferred Lungs:  Clear to auscultation bilaterally.  No crackles, rhonchi, or wheezes.   Heart:  Regular rate and rhythm.  No murmur or rubs. Abdomen:  Soft, nontender.  Positive bowel sounds.  No organomegaly or masses palpated.   Musculoskeletal:  No focal spinal  tenderness to palpation.  Extremities:  Benign.  No peripheral edema or cyanosis.   Skin:  Dry but otherwise benign.   Neuro:  Nonfocal.    Lab Results: Her CA 125 was 1118 on 01/18/12.  This was up slightly from 1064.8 on 1/15 and 1051.0 on 12/22/11.  Today's labs arre pending.  Lab Results  Component Value Date   WBC 6.3 02/01/2012   HGB 11.8 02/01/2012   HCT 34.7* 02/01/2012   MCV 92.8 02/01/2012   PLT 143* 02/01/2012   NEUTROABS 4.4 02/01/2012     Chemistry      Component Value Date/Time   NA 139 01/18/2012 1429   K 3.7 01/18/2012 1429   CL 103 01/18/2012 1429   CO2 26 01/18/2012 1429   BUN 16 01/18/2012 1429   CREATININE 0.81 01/18/2012 1429      Component Value Date/Time   CALCIUM 9.6 01/18/2012 1429   ALKPHOS 70 01/18/2012 1429   AST 17 01/18/2012 1429   ALT 18 01/18/2012 1429   BILITOT 0.3 01/18/2012 1429      Impression: 71 year old [place] woman with history of ovarian cancer, dating back to July 2002, when she underwent debulking surgery for what proved to be a 4 of 18 lymph nodes positive disease.   1.  Status post 6 cycles of paclitaxel and carboplatin, given adjuvantly and completed December 2002.   2.  First recurrence in December 2005 was treated with single agent paclitaxel. Second recurrence May 2007, treated again with carboplatin and paclitaxel.   3.  Third recurrence in January 2010 treated with topotecan and gemcitabine followed by a 2 year "break".   4.  Her fourth recurrence in January 2012 treated with single agent carboplatin until August 2012.  5.   Now with evidence of progression less than 6 months later, with platinum refractory disease. Currently being treated with single agent gemcitabine, given on a q. 2 week basis at a dose of 800 mg per meter square.   Plan: Cassandra Nichols will proceed to treatment today as scheduled for her next dose of gemcitabine. She will take the dexamethasone tomorrow morning as instructed, and we'll take no more than one tablet  of Compazine every 6 hours on day 2. She will call if this does not control her nausea.  Cassandra Nichols will return in 2 weeks for labs and treatment alone. She scheduled to see Dr. Darnelle Catalan for followup on March11. We will continue to follow her tumor marker closely in the meanwhile.   The patient voices understanding and agreement with this plan, and knows to call any changes or problems.  Cassandra Pickrell, PA-C 2/12/20133:09 PM

## 2012-02-07 ENCOUNTER — Ambulatory Visit: Payer: Medicare Other

## 2012-02-07 ENCOUNTER — Other Ambulatory Visit: Payer: Medicare Other | Admitting: Lab

## 2012-02-07 ENCOUNTER — Ambulatory Visit: Payer: Medicare Other | Admitting: Physician Assistant

## 2012-02-14 ENCOUNTER — Encounter: Payer: Self-pay | Admitting: Oncology

## 2012-02-15 ENCOUNTER — Ambulatory Visit: Payer: Medicare Other | Admitting: Physician Assistant

## 2012-02-15 ENCOUNTER — Ambulatory Visit (HOSPITAL_BASED_OUTPATIENT_CLINIC_OR_DEPARTMENT_OTHER): Payer: Medicare Other

## 2012-02-15 ENCOUNTER — Other Ambulatory Visit: Payer: Medicare Other | Admitting: Lab

## 2012-02-15 VITALS — BP 139/81 | HR 71 | Temp 97.5°F

## 2012-02-15 DIAGNOSIS — C569 Malignant neoplasm of unspecified ovary: Secondary | ICD-10-CM

## 2012-02-15 DIAGNOSIS — Z5111 Encounter for antineoplastic chemotherapy: Secondary | ICD-10-CM

## 2012-02-15 LAB — COMPREHENSIVE METABOLIC PANEL
Alkaline Phosphatase: 67 U/L (ref 39–117)
BUN: 16 mg/dL (ref 6–23)
CO2: 25 mEq/L (ref 19–32)
Creatinine, Ser: 0.8 mg/dL (ref 0.50–1.10)
Glucose, Bld: 88 mg/dL (ref 70–99)
Total Bilirubin: 0.4 mg/dL (ref 0.3–1.2)

## 2012-02-15 LAB — CBC WITH DIFFERENTIAL/PLATELET
Basophils Absolute: 0 10*3/uL (ref 0.0–0.1)
EOS%: 1 % (ref 0.0–7.0)
HCT: 36.7 % (ref 34.8–46.6)
HGB: 12.4 g/dL (ref 11.6–15.9)
LYMPH%: 21.9 % (ref 14.0–49.7)
MCH: 31.5 pg (ref 25.1–34.0)
MCV: 93.1 fL (ref 79.5–101.0)
MONO%: 7.8 % (ref 0.0–14.0)
NEUT%: 68.7 % (ref 38.4–76.8)
Platelets: 152 10*3/uL (ref 145–400)
RDW: 16.7 % — ABNORMAL HIGH (ref 11.2–14.5)

## 2012-02-15 MED ORDER — PROCHLORPERAZINE MALEATE 10 MG PO TABS
10.0000 mg | ORAL_TABLET | Freq: Once | ORAL | Status: AC
Start: 2012-02-15 — End: 2012-02-15
  Administered 2012-02-15: 10 mg via ORAL

## 2012-02-15 MED ORDER — SODIUM CHLORIDE 0.9 % IV SOLN
Freq: Once | INTRAVENOUS | Status: AC
  Administered 2012-02-15: 15:00:00 via INTRAVENOUS

## 2012-02-15 MED ORDER — SODIUM CHLORIDE 0.9 % IJ SOLN
10.0000 mL | INTRAMUSCULAR | Status: DC | PRN
  Administered 2012-02-15: 10 mL
  Filled 2012-02-15: qty 10

## 2012-02-15 MED ORDER — SODIUM CHLORIDE 0.9 % IV SOLN
800.0000 mg/m2 | Freq: Once | INTRAVENOUS | Status: AC
  Administered 2012-02-15: 1254 mg via INTRAVENOUS
  Filled 2012-02-15: qty 33

## 2012-02-15 MED ORDER — HEPARIN SOD (PORK) LOCK FLUSH 100 UNIT/ML IV SOLN
250.0000 [IU] | Freq: Once | INTRAVENOUS | Status: AC | PRN
  Administered 2012-02-15: 500 [IU]
  Filled 2012-02-15: qty 5

## 2012-02-28 ENCOUNTER — Ambulatory Visit (HOSPITAL_BASED_OUTPATIENT_CLINIC_OR_DEPARTMENT_OTHER): Payer: Medicare Other

## 2012-02-28 ENCOUNTER — Ambulatory Visit (HOSPITAL_BASED_OUTPATIENT_CLINIC_OR_DEPARTMENT_OTHER): Payer: Medicare Other | Admitting: Oncology

## 2012-02-28 ENCOUNTER — Other Ambulatory Visit (HOSPITAL_BASED_OUTPATIENT_CLINIC_OR_DEPARTMENT_OTHER): Payer: Medicare Other | Admitting: Lab

## 2012-02-28 ENCOUNTER — Telehealth: Payer: Self-pay | Admitting: Oncology

## 2012-02-28 VITALS — BP 138/84 | HR 81 | Temp 98.4°F | Ht 59.5 in | Wt 128.7 lb

## 2012-02-28 DIAGNOSIS — C569 Malignant neoplasm of unspecified ovary: Secondary | ICD-10-CM

## 2012-02-28 DIAGNOSIS — Z5111 Encounter for antineoplastic chemotherapy: Secondary | ICD-10-CM

## 2012-02-28 LAB — CBC WITH DIFFERENTIAL/PLATELET
Eosinophils Absolute: 0 10*3/uL (ref 0.0–0.5)
HGB: 11.8 g/dL (ref 11.6–15.9)
MONO#: 0.5 10*3/uL (ref 0.1–0.9)
NEUT#: 2.7 10*3/uL (ref 1.5–6.5)
RBC: 3.76 10*6/uL (ref 3.70–5.45)
RDW: 16.6 % — ABNORMAL HIGH (ref 11.2–14.5)
WBC: 4.6 10*3/uL (ref 3.9–10.3)
lymph#: 1.3 10*3/uL (ref 0.9–3.3)
nRBC: 0 % (ref 0–0)

## 2012-02-28 LAB — COMPREHENSIVE METABOLIC PANEL
ALT: 17 U/L (ref 0–35)
AST: 21 U/L (ref 0–37)
Albumin: 3.8 g/dL (ref 3.5–5.2)
Calcium: 9.3 mg/dL (ref 8.4–10.5)
Chloride: 105 mEq/L (ref 96–112)
Creatinine, Ser: 0.75 mg/dL (ref 0.50–1.10)
Potassium: 3.8 mEq/L (ref 3.5–5.3)
Sodium: 138 mEq/L (ref 135–145)
Total Protein: 7 g/dL (ref 6.0–8.3)

## 2012-02-28 LAB — CA 125: CA 125: 1059.6 U/mL — ABNORMAL HIGH (ref 0.0–30.2)

## 2012-02-28 MED ORDER — HEPARIN SOD (PORK) LOCK FLUSH 100 UNIT/ML IV SOLN
500.0000 [IU] | Freq: Once | INTRAVENOUS | Status: AC
  Administered 2012-02-28: 500 [IU] via INTRAVENOUS
  Filled 2012-02-28: qty 5

## 2012-02-28 MED ORDER — SODIUM CHLORIDE 0.9 % IV SOLN
Freq: Once | INTRAVENOUS | Status: AC
  Administered 2012-02-28: 15:00:00 via INTRAVENOUS

## 2012-02-28 MED ORDER — PROCHLORPERAZINE MALEATE 10 MG PO TABS
10.0000 mg | ORAL_TABLET | Freq: Once | ORAL | Status: AC
  Administered 2012-02-28: 10 mg via ORAL

## 2012-02-28 MED ORDER — HEPARIN SOD (PORK) LOCK FLUSH 100 UNIT/ML IV SOLN
250.0000 [IU] | Freq: Once | INTRAVENOUS | Status: DC | PRN
  Filled 2012-02-28: qty 5

## 2012-02-28 MED ORDER — SODIUM CHLORIDE 0.9 % IJ SOLN
10.0000 mL | INTRAMUSCULAR | Status: DC | PRN
  Administered 2012-02-28: 10 mL
  Filled 2012-02-28: qty 10

## 2012-02-28 MED ORDER — SODIUM CHLORIDE 0.9 % IV SOLN
800.0000 mg/m2 | Freq: Once | INTRAVENOUS | Status: AC
  Administered 2012-02-28: 1254 mg via INTRAVENOUS
  Filled 2012-02-28: qty 33

## 2012-02-28 NOTE — Patient Instructions (Signed)
Pt ambulatory, no complaints, aware of future appts.  SLJ 

## 2012-02-28 NOTE — Telephone Encounter (Signed)
lmonvm adviisng the pt to call me back to get her next f/u appts along with the ct scan appt

## 2012-02-28 NOTE — Progress Notes (Signed)
ID: [name]   DOB: [birth year]  MR#: [id number]  LOV#:[id number]  HISTORY OF PRESENT ILLNESS:  INTERVAL HISTORY: Cassandra Nichols returns today for followup of her ovarian cancer. Since the last visit here she has done some substituting in grade school, half days. She finds this very taxing. Aside from that, she does some house work, and recently wrote down with a friend to [place] to visit her son Denny Peon and his wife to be Belenda Cruise (she actually stays overnight with parents former wife, French Ana) and Darienne Belleau got to see a couple of basketball games with her grandchildren playing, which was a great pleasure to her. Unfortunately her daughter [name] is going through a separation and that plus body problems other chief sources of Cassandra Nichols anxiety at the present  REVIEW OF SYSTEMS: Review of systems is otherwise benign. She has had no swelling, problems with bowel movements or bladder function, weight gain, worsening fatigue, or any kind of pain, fever, rash, or bleeding. She is "fine". There is no evidence of peripheral neuropathy or symptomatic cytopenias  PAST MEDICAL HISTORY: Past Medical History  Diagnosis Date  . Ovarian cancer 11/17/2011    PAST SURGICAL HISTORY: No past surgical history on file.  FAMILY HISTORY No family history on file.  GYNECOLOGIC HISTORY:  SOCIAL HISTORY:    ADVANCED DIRECTIVES:  HEALTH MAINTENANCE: History  Substance Use Topics  . Smoking status: Never Smoker   . Smokeless tobacco: Not on file  . Alcohol Use: No     Colonoscopy:  PAP:  Bone density:  Lipid panel:  Allergies  Allergen Reactions  . Penicillins Rash    Current Outpatient Prescriptions  Medication Sig Dispense Refill  . dexamethasone (DECADRON) 4 MG tablet Take 8 mg by mouth as directed.      Marland Kitchen ibuprofen (ADVIL,MOTRIN) 800 MG tablet Take 800 mg by mouth every 8 (eight) hours as needed.        . lidocaine-prilocaine (EMLA) cream Apply 1 application topically as needed.        .  prochlorperazine (COMPAZINE) 10 MG tablet Take 10 mg by mouth every 6 (six) hours as needed.         No current facility-administered medications for this visit.   Facility-Administered Medications Ordered in Other Visits  Medication Dose Route Frequency Provider Last Rate Last Dose  . sodium chloride 0.9 % injection 10 mL  10 mL Intracatheter PRN Catalina Gravel, PA   10 mL at 01/04/12 1625    OBJECTIVE: Middle-aged white woman who appears tired Filed Vitals:   02/28/12 1257  BP: 138/84  Pulse: 81  Temp: 98.4 F (36.9 C)     Body mass index is 25.56 kg/(m^2).    ECOG FS: 1  Sclerae unicteric Oropharynx clear No peripheral adenopathy Lungs no rales or rhonchi Heart regular rate and rhythm Abd benign, specifically no evidence of ascites, no organomegaly, no masses palpated. I'll sounds were positive and exam was nontender MSK no focal spinal tenderness, no peripheral edema Neuro: nonfocal Breasts: Deferred  LAB RESULTS: Her CA 125 was 1279 in November, was 839 at the most recent reading in February.  Lab Results  Component Value Date   WBC 4.6 02/28/2012   NEUTROABS 2.7 02/28/2012   HGB 11.8 02/28/2012   HCT 35.7 02/28/2012   MCV 94.9 02/28/2012   PLT 126* 02/28/2012      Chemistry      Component Value Date/Time   NA 138 02/15/2012 1432   K 4.1 02/15/2012 1432  CL 104 02/15/2012 1432   CO2 25 02/15/2012 1432   BUN 16 02/15/2012 1432   CREATININE 0.80 02/15/2012 1432      Component Value Date/Time   CALCIUM 9.7 02/15/2012 1432   ALKPHOS 67 02/15/2012 1432   AST 17 02/15/2012 1432   ALT 14 02/15/2012 1432   BILITOT 0.4 02/15/2012 1432       Lab Results  Component Value Date   LABCA2 27 04/29/2008    No results found for this basename: INR:1;PROTIME:1 in the last 168 hours  No results found for this basename: UACOL:1,UAPR:1,USPG:1,UPH:1,UTP:1,UGL:1,UKET:1,UBIL:1,UHGB:1,UNIT:1,UROB:1,ULEU:1,UEPI:1,UWBC:1,URBC:1,UBAC:1,CAST:1,CRYS:1,UCOM:1,BILUA:1 in the last 72  hours   STUDIES: No new results found.  ASSESSMENT: A 71 year old [place] woman with a history of ovarian cancer dating back to July 2002 when she underwent debulking surgery for what proved to be 4/18 lymph node positive disease.  She received 6 cycles of paclitaxel/carboplatin adjuvantly completed in December 2002.  She had her first recurrence in December 2005 treated with paclitaxel; second recurrence May 2007 treated with [place] and paclitaxel; third recurrence January 2010 treated with topotecan and Gemzar;  fourth recurrence January 2012 being treated with single-agent [place] given every 2 weeks, with good tolerance.Marland Kitchen   PLAN: Cassandra Nichols going to continue the treatment as scheduled. She will see [place] again in April and then before the May visit I will obtain a restaging CT scan. We will follow her CA 125 on a monthly basis. In May we will consider perhaps giving her a break of a few months but she is tolerating treatment quite well and we certainly have not come anywhere near to normalizing her CA 125.  Recall that patient prefers not to know any specific "numbers" regarding her treatment, including specific values of CEA 125, lab work, or size of any measurable disease. Cassandra Nichols C    02/28/2012

## 2012-02-28 NOTE — Telephone Encounter (Signed)
bve the pt her march-may 2013 appt calendar along with the ct scan appt

## 2012-03-03 ENCOUNTER — Telehealth: Payer: Self-pay | Admitting: *Deleted

## 2012-03-13 ENCOUNTER — Ambulatory Visit: Payer: Medicare Other

## 2012-03-13 ENCOUNTER — Other Ambulatory Visit: Payer: Medicare Other | Admitting: Lab

## 2012-03-14 ENCOUNTER — Other Ambulatory Visit: Payer: Medicare Other

## 2012-03-14 ENCOUNTER — Ambulatory Visit: Payer: Medicare Other

## 2012-03-17 ENCOUNTER — Other Ambulatory Visit (HOSPITAL_BASED_OUTPATIENT_CLINIC_OR_DEPARTMENT_OTHER): Payer: Medicare Other | Admitting: Lab

## 2012-03-17 ENCOUNTER — Ambulatory Visit (HOSPITAL_BASED_OUTPATIENT_CLINIC_OR_DEPARTMENT_OTHER): Payer: Medicare Other

## 2012-03-17 VITALS — BP 121/73 | HR 72 | Temp 98.0°F

## 2012-03-17 DIAGNOSIS — C569 Malignant neoplasm of unspecified ovary: Secondary | ICD-10-CM

## 2012-03-17 DIAGNOSIS — Z5111 Encounter for antineoplastic chemotherapy: Secondary | ICD-10-CM

## 2012-03-17 LAB — CBC WITH DIFFERENTIAL/PLATELET
BASO%: 0.8 % (ref 0.0–2.0)
Basophils Absolute: 0 10*3/uL (ref 0.0–0.1)
EOS%: 0.7 % (ref 0.0–7.0)
HGB: 12 g/dL (ref 11.6–15.9)
MCH: 32.4 pg (ref 25.1–34.0)
MCHC: 33.3 g/dL (ref 31.5–36.0)
MCV: 97.2 fL (ref 79.5–101.0)
MONO%: 8.4 % (ref 0.0–14.0)
NEUT%: 55.8 % (ref 38.4–76.8)
RDW: 17.3 % — ABNORMAL HIGH (ref 11.2–14.5)
lymph#: 1.6 10*3/uL (ref 0.9–3.3)

## 2012-03-17 LAB — COMPREHENSIVE METABOLIC PANEL
AST: 22 U/L (ref 0–37)
Alkaline Phosphatase: 71 U/L (ref 39–117)
BUN: 16 mg/dL (ref 6–23)
Glucose, Bld: 117 mg/dL — ABNORMAL HIGH (ref 70–99)
Potassium: 3.7 mEq/L (ref 3.5–5.3)
Sodium: 136 mEq/L (ref 135–145)
Total Bilirubin: 0.3 mg/dL (ref 0.3–1.2)

## 2012-03-17 MED ORDER — SODIUM CHLORIDE 0.9 % IV SOLN
Freq: Once | INTRAVENOUS | Status: AC
  Administered 2012-03-17: 16:00:00 via INTRAVENOUS

## 2012-03-17 MED ORDER — PROCHLORPERAZINE MALEATE 10 MG PO TABS: 10.0000 mg | ORAL_TABLET | Freq: Once | ORAL | Status: DC

## 2012-03-17 MED ORDER — SODIUM CHLORIDE 0.9 % IJ SOLN
10.0000 mL | INTRAMUSCULAR | Status: DC | PRN
  Administered 2012-03-17: 10 mL
  Filled 2012-03-17: qty 10

## 2012-03-17 MED ORDER — SODIUM CHLORIDE 0.9 % IV SOLN
800.0000 mg/m2 | Freq: Once | INTRAVENOUS | Status: AC
  Administered 2012-03-17: 1254 mg via INTRAVENOUS
  Filled 2012-03-17: qty 33

## 2012-03-17 MED ORDER — HEPARIN SOD (PORK) LOCK FLUSH 100 UNIT/ML IV SOLN
250.0000 [IU] | Freq: Once | INTRAVENOUS | Status: AC | PRN
  Administered 2012-03-17: 250 [IU]
  Filled 2012-03-17: qty 5

## 2012-03-17 NOTE — Patient Instructions (Signed)
Pt aware of next appt. DC home ambulatory by self

## 2012-03-22 ENCOUNTER — Telehealth: Payer: Self-pay | Admitting: *Deleted

## 2012-03-22 NOTE — Telephone Encounter (Signed)
This RN returned call to pt's identified answering machine per her call regarding appt with AB 4/9 .  Detailed message left informing pt appt for above will be cancelled since she is scheduled for restaging and MD follow up early May.

## 2012-03-22 NOTE — Telephone Encounter (Signed)
per orders from 03-22-2012 cancelled lab and mid level

## 2012-03-28 ENCOUNTER — Other Ambulatory Visit: Payer: Medicare Other | Admitting: Lab

## 2012-03-28 ENCOUNTER — Ambulatory Visit: Payer: Medicare Other | Admitting: Physician Assistant

## 2012-04-20 ENCOUNTER — Other Ambulatory Visit (HOSPITAL_COMMUNITY): Payer: Medicare Other

## 2012-04-21 ENCOUNTER — Encounter (HOSPITAL_COMMUNITY): Payer: Self-pay

## 2012-04-21 ENCOUNTER — Ambulatory Visit (HOSPITAL_COMMUNITY)
Admission: RE | Admit: 2012-04-21 | Discharge: 2012-04-21 | Disposition: A | Discharge status: Home / Self Care | Payer: Medicare Other | Source: Ambulatory Visit | Attending: Oncology | Admitting: Oncology

## 2012-04-21 DIAGNOSIS — M25473 Effusion, unspecified ankle: Secondary | ICD-10-CM | POA: Insufficient documentation

## 2012-04-21 DIAGNOSIS — Z9221 Personal history of antineoplastic chemotherapy: Secondary | ICD-10-CM | POA: Insufficient documentation

## 2012-04-21 DIAGNOSIS — M7989 Other specified soft tissue disorders: Secondary | ICD-10-CM | POA: Insufficient documentation

## 2012-04-21 DIAGNOSIS — M899 Disorder of bone, unspecified: Secondary | ICD-10-CM | POA: Insufficient documentation

## 2012-04-21 DIAGNOSIS — C569 Malignant neoplasm of unspecified ovary: Secondary | ICD-10-CM

## 2012-04-21 DIAGNOSIS — Z90710 Acquired absence of both cervix and uterus: Secondary | ICD-10-CM | POA: Insufficient documentation

## 2012-04-21 DIAGNOSIS — Z9079 Acquired absence of other genital organ(s): Secondary | ICD-10-CM | POA: Insufficient documentation

## 2012-04-21 DIAGNOSIS — R599 Enlarged lymph nodes, unspecified: Secondary | ICD-10-CM | POA: Insufficient documentation

## 2012-04-21 DIAGNOSIS — M949 Disorder of cartilage, unspecified: Secondary | ICD-10-CM | POA: Insufficient documentation

## 2012-04-21 DIAGNOSIS — M25476 Effusion, unspecified foot: Secondary | ICD-10-CM | POA: Insufficient documentation

## 2012-04-21 DIAGNOSIS — K7689 Other specified diseases of liver: Secondary | ICD-10-CM | POA: Insufficient documentation

## 2012-04-21 DIAGNOSIS — M799 Soft tissue disorder, unspecified: Secondary | ICD-10-CM | POA: Insufficient documentation

## 2012-04-21 MED ORDER — IOHEXOL 300 MG/ML  SOLN
100.0000 mL | Freq: Once | INTRAMUSCULAR | Status: AC | PRN
  Administered 2012-04-21: 100 mL via INTRAVENOUS

## 2012-04-24 ENCOUNTER — Other Ambulatory Visit: Payer: Medicare Other | Admitting: Lab

## 2012-04-25 ENCOUNTER — Ambulatory Visit (HOSPITAL_BASED_OUTPATIENT_CLINIC_OR_DEPARTMENT_OTHER): Payer: Medicare Other | Admitting: Oncology

## 2012-04-25 ENCOUNTER — Ambulatory Visit: Payer: Medicare Other | Admitting: Oncology

## 2012-04-25 ENCOUNTER — Other Ambulatory Visit (HOSPITAL_BASED_OUTPATIENT_CLINIC_OR_DEPARTMENT_OTHER): Payer: Medicare Other | Admitting: Lab

## 2012-04-25 VITALS — BP 143/85 | HR 70 | Temp 98.2°F | Ht 59.5 in | Wt 124.9 lb

## 2012-04-25 DIAGNOSIS — C569 Malignant neoplasm of unspecified ovary: Secondary | ICD-10-CM

## 2012-04-25 LAB — CBC WITH DIFFERENTIAL/PLATELET
Basophils Absolute: 0 10*3/uL (ref 0.0–0.1)
EOS%: 0.8 % (ref 0.0–7.0)
Eosinophils Absolute: 0 10*3/uL (ref 0.0–0.5)
HCT: 38.2 % (ref 34.8–46.6)
HGB: 12.8 g/dL (ref 11.6–15.9)
MONO#: 0.4 10*3/uL (ref 0.1–0.9)
NEUT#: 2.8 10*3/uL (ref 1.5–6.5)
RDW: 15.8 % — ABNORMAL HIGH (ref 11.2–14.5)
lymph#: 1.9 10*3/uL (ref 0.9–3.3)

## 2012-04-25 LAB — COMPREHENSIVE METABOLIC PANEL
ALT: 15 U/L (ref 0–35)
AST: 20 U/L (ref 0–37)
Alkaline Phosphatase: 87 U/L (ref 39–117)
Creatinine, Ser: 0.79 mg/dL (ref 0.50–1.10)
Sodium: 139 mEq/L (ref 135–145)
Total Bilirubin: 0.3 mg/dL (ref 0.3–1.2)

## 2012-04-25 NOTE — Progress Notes (Signed)
ID: [name]   DOB: 07/20/1941  MR#: [id number]  JYN#:[id number]  INTERVAL HISTORY: Cassandra Nichols returns today to review results of her restaging studies. 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 This is the time when she makes most of her income for the year, and it is important not to schedule treatments or appointments that would cause her to lose work time. This is noted below.  REVIEW OF SYSTEMS: She feels she is "retaining fluid". She's tried to fight this with acupuncture, drinking pure water, taking soaking baths with IV no, and cutting down salt. The feeling is in her face hands and feet. She's also having some cramps on her feet. These are very intermittent. Otherwise a detailed review of systems was noncontributory and particularly she is having no symptoms that she is aware of from the chemotherapy she is currently receiving.  PAST MEDICAL HISTORY: Past Medical History  Diagnosis Date  . Ovarian cancer 11/17/2011    PAST SURGICAL HISTORY: No past surgical history on file.  FAMILY HISTORY No family history on file.  GYNECOLOGIC HISTORY:  SOCIAL HISTORY: Cassandra Nichols lives with her mother. Her daughter Cassandra Nichols lives in [place] and is Interior and spatial designer of the epidemiology and genetics Center there; she is married with 2 children. [name]. He has 4 children of his own.   ADVANCED DIRECTIVES:  HEALTH MAINTENANCE: History  Substance Use Topics  . Smoking status: Never Smoker   . Smokeless tobacco: Not on file  . Alcohol Use: No     Colonoscopy:  PAP:  Bone density:  Lipid panel:  Allergies  Allergen Reactions  . Penicillins Rash    Current Outpatient Prescriptions  Medication Sig Dispense Refill  . dexamethasone (DECADRON) 4 MG tablet Take 8 mg by mouth as directed.      Marland Kitchen ibuprofen (ADVIL,MOTRIN) 800 MG tablet Take 800 mg by mouth every 8 (eight) hours as needed.        . lidocaine-prilocaine (EMLA)  cream Apply 1 application topically as needed.        . prochlorperazine (COMPAZINE) 10 MG tablet Take 10 mg by mouth every 6 (six) hours as needed.         No current facility-administered medications for this visit.   Facility-Administered Medications Ordered in Other Visits  Medication Dose Route Frequency Provider Last Rate Last Dose  . sodium chloride 0.9 % injection 10 mL  10 mL Intracatheter PRN Catalina Gravel, PA   10 mL at 01/04/12 1625    OBJECTIVE: Middle-aged white woman who appears tired Filed Vitals:   04/25/12 1556  BP: 143/85  Pulse: 70  Temp: 98.2 F (36.8 C)     Body mass index is 24.80 kg/(m^2).    ECOG FS: 1  Sclerae unicteric Oropharynx clear No peripheral adenopathy Lungs no rales or rhonchi Heart regular rate and rhythm Abd soft, nontender, positive bowel sounds, no fluid wave MSK no focal spinal tenderness, very minimal ankle edema bilaterally Neuro: nonfocal  LAB RESULTS: Lab Results  Component Value Date   WBC 5.2 04/25/2012   NEUTROABS 2.8 04/25/2012   HGB 12.8 04/25/2012   HCT 38.2 04/25/2012   MCV 92.0 04/25/2012   PLT 158 04/25/2012      Chemistry      Component Value Date/Time   NA 136 03/17/2012 1533   K 3.7 03/17/2012 1533   CL 100 03/17/2012 1533   CO2  25 03/17/2012 1533   BUN 16 03/17/2012 1533   CREATININE 0.90 03/17/2012 1533      Component Value Date/Time   CALCIUM 9.6 03/17/2012 1533   ALKPHOS 71 03/17/2012 1533   AST 22 03/17/2012 1533   ALT 17 03/17/2012 1533   BILITOT 0.3 03/17/2012 1533       Lab Results  Component Value Date   LABCA2 27 04/29/2008    No components found with this basename: [id number]    No results found for this basename: INR:1;PROTIME:1 in the last 168 hours  Urinalysis No results found for this basename: colorurine, appearanceur, labspec, phurine, glucoseu, hgbur, bilirubinur, ketonesur, proteinur, urobilinogen, nitrite, leukocytesur    STUDIES: Ct Abdomen Pelvis W Wo Contrast  04/21/2012  *RADIOLOGY REPORT*   Clinical Data: Cancer status post chemotherapy and March 2013. Feet and ankle swelling.  Restaging scan.  CT ABDOMEN AND PELVIS WITHOUT AND WITH CONTRAST  Technique:  Multidetector CT imaging of the abdomen and pelvis was performed without contrast material in one or both body regions, followed by contrast material(s) and further sections in one or both body regions.  Contrast: OMNIPAQUE IOHEXOL 300 MG/ML  SOLN  Comparison: CT chest, abdomen and pelvis 10/27/2010.  Findings:  Lung Bases: Interval enlargement of a right juxta diaphragmatic lymph node (however, this remains only 6 mm in short axis); a nonspecific finding.  Abdomen/Pelvis:  Within the segment 4A of the liver immediately adjacent the falciform ligament there is an ill-defined low attenuation lesion that measures approximately 18 x 14 mm (similar to prior study), and a well defined 4 mm low attenuation lesion is again noted within segment 8 immediately adjacent to the portal vein branch (image 15 of series 2).  No other new hepatic lesions are identified.  However, there is increasing nodularity along the liver surface in several regions, the largest of which is demonstrated on image 6 of series 2 where there is a 9 mm soft tissue attenuation nodule (previously 5 mm).  The enhanced appearance of the gallbladder, pancreas, spleen, and bilateral adrenal glands and bilateral kidneys is unremarkable.  The patient is status post total abdominal hysterectomy and bilateral salpingo-oophorectomy.  Along the left pelvic side wall and in the presacral space are again numerous heterogeneously enhancing soft tissue lesions, the largest of which as demonstrated on image 56 of series 2 measuring 2.8 x 2.0 cm (previously 2.1 x 1.9 cm).  Image 51 of series 2 demonstrates a these soft tissue lesion that has slightly decreased in size currently measuring 2.2 x 1.7 cm (previously 2.2 x 2.0 cm).  However, another lesion in the presacral space (image 50 of series 2)  appears slightly larger, currently measuring 2.1 x 1.8 cm (previously 1.4 x 1.7 cm). Surgical clips are noted in the left adnexal region, and throughout the retroperitoneum.  No large volume of ascites is noted at this time.  No pneumoperitoneum.  No pathologic distension of bowel.  Musculoskeletal:  A well-defined sclerotic lesion in the left ilium immediately superior to the acetabulum is unchanged, most compatible with a bone island. There are no aggressive appearing lytic or blastic lesions noted in the visualized portions of the skeleton.  IMPRESSION: 1.  Findings, as above demonstrating slight interval progression of disease with enlarging capsular implants upon the surface of the liver, and the slight enlargement of two implants in the pelvis along the left pelvic sidewall in the presacral space.  Many other sites of disease appear stable compared to the prior examination, however,  as above.  Original Report Authenticated By: Florencia Reasons, M.D.   Dg Chest 2 View  04/21/2012  *RADIOLOGY REPORT*  Clinical Data: Restaging ovarian carcinoma  CHEST - 2 VIEW  Comparison: CT thorax 10/27/2010  Findings: Power port on the right with tip in distal SVC.  No effusion, infiltrate, or pneumothorax.  No suspicious pulmonary nodule by plain film radiography.  No aggressive osseous lesions.  IMPRESSION:  1.  No acute cardiopulmonary findings. 2.  No radiographic evidence of metastasis.  Original Report Authenticated By: Genevive Bi, M.D.    ASSESSMENT: 71 year old [place] woman with a history of ovarian cancer dating back to July 2002 when she underwent debulking surgery for what proved to be 4/18 lymph node positive disease. She received 6 cycles of paclitaxel/carboplatin adjuvantly completed in December 2002. She had her first recurrence in December 2005 treated with paclitaxel; second recurrence May 2007 treated with [place] and paclitaxel; third recurrence January 2010 treated with topotecan and Gemzar;  fourth recurrence January 2012 treated with single-agent carboplatin (January to August 2012) with initial response, then plateau; with progression January 2013 (less than 6 months after last carbo dose) treated with single-agent gemzar given Q2 weeks (January to April 2013)   PLAN: The CA 125 and the CT scans are essentially stable, with very minimal progression if any. She would like to stop the chemotherapy to see whether this sensation of swelling is a side effect of it. We went over the fact that she has actually lost weight instead of gained--she is going to [employer]--and she understands that generally when there is significant edema out one's weight goes up. We have an albumin pending today, but I think she would benefit from an echocardiogram especially since we are considering switching her treatment to Doxil.  Accordingly we left it that she will have a month off treatment. This will allow her to make some progress with her current job. She will see me in a month with lab work before that visit, and she will have an echocardiogram which if normal will allow [place] to switch treatment then to once a month Doxil. She is very much in agreement with this plan. She knows to call for any problems that may develop before the next visit  Marelly Wehrman C    04/25/2012

## 2012-04-26 ENCOUNTER — Telehealth: Payer: Self-pay | Admitting: Oncology

## 2012-04-26 NOTE — Telephone Encounter (Signed)
lmonvm adviisng the pt of her echo and her lab and md appts in may and june

## 2012-05-02 ENCOUNTER — Ambulatory Visit (HOSPITAL_COMMUNITY): Payer: Medicare Other | Attending: Oncology

## 2012-05-17 ENCOUNTER — Other Ambulatory Visit: Payer: Medicare Other | Admitting: Lab

## 2012-05-23 ENCOUNTER — Ambulatory Visit: Payer: Medicare Other | Admitting: Oncology

## 2012-06-20 ENCOUNTER — Other Ambulatory Visit: Payer: Medicare Other | Admitting: Lab

## 2012-06-20 ENCOUNTER — Telehealth: Payer: Self-pay | Admitting: Oncology

## 2012-06-20 NOTE — Telephone Encounter (Signed)
S/w the pt and she is aware of her July appts along with the echo appt.

## 2012-07-04 ENCOUNTER — Ambulatory Visit (HOSPITAL_COMMUNITY)
Admission: RE | Admit: 2012-07-04 | Discharge: 2012-07-04 | Disposition: A | Discharge status: Home / Self Care | Payer: Medicare Other | Source: Ambulatory Visit | Attending: Oncology | Admitting: Oncology

## 2012-07-04 ENCOUNTER — Other Ambulatory Visit (HOSPITAL_BASED_OUTPATIENT_CLINIC_OR_DEPARTMENT_OTHER): Payer: Medicare Other | Admitting: Lab

## 2012-07-04 DIAGNOSIS — C569 Malignant neoplasm of unspecified ovary: Secondary | ICD-10-CM | POA: Insufficient documentation

## 2012-07-04 DIAGNOSIS — Z79899 Other long term (current) drug therapy: Secondary | ICD-10-CM

## 2012-07-04 LAB — CA 125: CA 125: 1860 U/mL — ABNORMAL HIGH (ref 0.0–30.2)

## 2012-07-04 LAB — COMPREHENSIVE METABOLIC PANEL
AST: 18 U/L (ref 0–37)
BUN: 17 mg/dL (ref 6–23)
Calcium: 9.2 mg/dL (ref 8.4–10.5)
Chloride: 106 mEq/L (ref 96–112)
Creatinine, Ser: 0.82 mg/dL (ref 0.50–1.10)
Total Bilirubin: 0.7 mg/dL (ref 0.3–1.2)

## 2012-07-04 LAB — CBC WITH DIFFERENTIAL/PLATELET
Basophils Absolute: 0 10*3/uL (ref 0.0–0.1)
EOS%: 0.6 % (ref 0.0–7.0)
HCT: 39.2 % (ref 34.8–46.6)
HGB: 13.4 g/dL (ref 11.6–15.9)
LYMPH%: 34 % (ref 14.0–49.7)
MCH: 30.4 pg (ref 25.1–34.0)
MCV: 88.9 fL (ref 79.5–101.0)
NEUT%: 59.2 % (ref 38.4–76.8)
Platelets: 130 10*3/uL — ABNORMAL LOW (ref 145–400)
lymph#: 1.8 10*3/uL (ref 0.9–3.3)

## 2012-07-04 NOTE — Progress Notes (Signed)
  Echocardiogram 2D Echocardiogram has been performed.  Rand Etchison 07/04/2012, 10:17 AM

## 2012-07-05 ENCOUNTER — Ambulatory Visit (HOSPITAL_BASED_OUTPATIENT_CLINIC_OR_DEPARTMENT_OTHER): Payer: Medicare Other | Admitting: Oncology

## 2012-07-05 ENCOUNTER — Telehealth: Payer: Self-pay | Admitting: *Deleted

## 2012-07-05 VITALS — BP 123/76 | HR 81 | Temp 98.8°F | Ht 59.5 in | Wt 123.5 lb

## 2012-07-05 DIAGNOSIS — C569 Malignant neoplasm of unspecified ovary: Secondary | ICD-10-CM

## 2012-07-05 DIAGNOSIS — R971 Elevated cancer antigen 125 [CA 125]: Secondary | ICD-10-CM

## 2012-07-05 DIAGNOSIS — R609 Edema, unspecified: Secondary | ICD-10-CM

## 2012-07-05 NOTE — Progress Notes (Signed)
ID: [name]   DOB: 03-Nov-1941  MR#: [id number]  NWG#:[id number]  INTERVAL HISTORY: Cassandra Nichols returns today for followup of her ovarian cancer. She is currently in between jobs, but hopes to get started again within the next couple of weeks and then she will be working Monday through Friday.  REVIEW OF SYSTEMS: She continues to feel that she is "swelling all over". She is back under her eyes and she can't wear her rings. She is a little bit of ankle edema as well. She tried cutting down on salt which didn't help and she went to a chiropractor which helped initially but then of seem to make no difference. Of course she just had an echocardiogram which shows incidentally a very well preserved ejection fraction. 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  PAST MEDICAL HISTORY: Past Medical History  Diagnosis Date  . Ovarian cancer 11/17/2011    PAST SURGICAL HISTORY: No past surgical history on file.  FAMILY HISTORY No family history on file.  GYNECOLOGIC HISTORY:  SOCIAL HISTORY: Cassandra Nichols lives with her mother. Her daughter Cassandra Nichols lives in [place] and is Interior and spatial designer of the epidemiology and genetics Center there; she is married with 2 children. Son  Cassandra Nichols lives in [place]. He has 4 children of his own.   ADVANCED DIRECTIVES:  HEALTH MAINTENANCE: History  Substance Use Topics  . Smoking status: Never Smoker   . Smokeless tobacco: Not on file  . Alcohol Use: No     Colonoscopy:  PAP:  Bone density:  Lipid panel:  Allergies  Allergen Reactions  . Penicillins Rash    Current Outpatient Prescriptions  Medication Sig  Dispense Refill  . ibuprofen (ADVIL,MOTRIN) 800 MG tablet Take 800 mg by mouth every 8 (eight) hours as needed.        . lidocaine-prilocaine (EMLA) cream Apply 1 application topically as needed.        . prochlorperazine (COMPAZINE) 10 MG tablet Take 10 mg by mouth every 6 (six) hours as needed.         No current facility-administered medications for this visit.   Facility-Administered Medications Ordered in Other Visits  Medication Dose Route Frequency Provider Last Rate Last Dose  . sodium chloride 0.9 % injection 10 mL  10 mL Intracatheter PRN Catalina Gravel, PA   10 mL at 01/04/12 1625    OBJECTIVE: Middle-aged white woman in no acute distress Filed Vitals:   07/05/12 1543  BP: 123/76  Pulse: 81  Temp: 98.8 F (37.1 C)     Body mass index is 24.53 kg/(m^2).    ECOG FS: 1  Sclerae unicteric Oropharynx clear No peripheral adenopathy Lungs no rales or rhonchi Heart regular rate and rhythm Abd soft, nontender, positive bowel sounds, no obvious swelling or ascites MSK no focal spinal tenderness, very minimal ankle edema bilaterally Neuro: nonfocal  LAB RESULTS: Lab Results  Component Value Date   WBC 5.2 07/04/2012   NEUTROABS 3.1 07/04/2012   HGB 13.4 07/04/2012   HCT 39.2 07/04/2012   MCV 88.9 07/04/2012   PLT 130* 07/04/2012      Chemistry  Component Value Date/Time   NA 140 07/04/2012 0832   K 4.2 07/04/2012 0832   CL 106 07/04/2012 0832   CO2 26 07/04/2012 0832   BUN 17 07/04/2012 0832   CREATININE 0.82 07/04/2012 0832      Component Value Date/Time   CALCIUM 9.2 07/04/2012 0832   ALKPHOS 71 07/04/2012 0832   AST 18 07/04/2012 0832   ALT 12 07/04/2012 0832   BILITOT 0.7 07/04/2012 0832       Lab Results  Component Value Date   LABCA2 27 04/29/2008    No components found with this basename: [id number]    No results found for this basename: INR:1;PROTIME:1 in the last 168 hours  Urinalysis No results found for this basename: colorurine,  appearanceur,   labspec,  phurine,  glucoseu,  hgbur,  bilirubinur,  ketonesur,  proteinur,  urobilinogen,  nitrite,  leukocytesur    STUDIES: Echocardiogram 07/04/2012 showed a normal left ventricular size and function, the ejection fraction being 55-60%. Mammography February 2013 was benign  ASSESSMENT: 71 year old [place] woman with a history of ovarian cancer dating back to July 2002 when she underwent debulking surgery for what proved to be 4/18 lymph node positive disease. She received 6 cycles of paclitaxel/carboplatin adjuvantly completed in December 2002. She had her first recurrence in December 2005 treated with paclitaxel; second recurrence May 2007 treated with [place] and paclitaxel; third recurrence January 2010 treated with topotecan and Gemzar; fourth recurrence January 2012 treated with single-agent carboplatin (January to August 2012) with initial response, then plateau; with progression January 2013 (less than 6 months after last carbo dose) treated with single-agent gemzar given Q2 weeks (January to April 2013)   PLAN: Her CA 125 has continued to rise, currently at 1860 (it was 1307 in May and 1158 in March). I am not sure why she is having the swelling she is experiencing, since she has an excellent ejection fraction and the albumin is 4.1. I have suggested she cut down on sodium more and try compression stockings, but really the "swelling" is minimal.  I do think we need to resume treatment. She has not had Doxil in the past and we are going to start that on July 23d. We will repeat treatments every 3 weeks and continue to monitor her CA 125 with each treatment. She will use prochlorperazine for nausea control, which has worked well for her before. We will see her approximately one week after the first treatment, and subsequently we will see her with each treatment, to minimize visits here, which is one of her goals. She knows to call for any problems that may develop before the next visit     MAGRINAT,GUSTAV C    07/05/2012

## 2012-07-05 NOTE — Telephone Encounter (Signed)
Sent michelle email to set up patient's treatment 

## 2012-07-06 ENCOUNTER — Telehealth: Payer: Self-pay | Admitting: *Deleted

## 2012-07-06 NOTE — Telephone Encounter (Signed)
Per staff message and POF I have scheduled appts.  JMW  

## 2012-07-11 ENCOUNTER — Ambulatory Visit (HOSPITAL_BASED_OUTPATIENT_CLINIC_OR_DEPARTMENT_OTHER): Payer: Medicare Other

## 2012-07-11 ENCOUNTER — Other Ambulatory Visit (HOSPITAL_BASED_OUTPATIENT_CLINIC_OR_DEPARTMENT_OTHER): Payer: Medicare Other | Admitting: Lab

## 2012-07-11 VITALS — BP 133/77 | HR 61 | Temp 98.0°F

## 2012-07-11 DIAGNOSIS — C569 Malignant neoplasm of unspecified ovary: Secondary | ICD-10-CM

## 2012-07-11 DIAGNOSIS — Z5111 Encounter for antineoplastic chemotherapy: Secondary | ICD-10-CM

## 2012-07-11 LAB — CBC WITH DIFFERENTIAL/PLATELET
BASO%: 0.4 % (ref 0.0–2.0)
EOS%: 0.4 % (ref 0.0–7.0)
LYMPH%: 30.5 % (ref 14.0–49.7)
MCH: 30.5 pg (ref 25.1–34.0)
MCHC: 34.4 g/dL (ref 31.5–36.0)
MCV: 88.8 fL (ref 79.5–101.0)
MONO%: 6 % (ref 0.0–14.0)
Platelets: 145 10*3/uL (ref 145–400)
RBC: 4.36 10*6/uL (ref 3.70–5.45)
WBC: 5 10*3/uL (ref 3.9–10.3)

## 2012-07-11 MED ORDER — SODIUM CHLORIDE 0.9 % IV SOLN
Freq: Once | INTRAVENOUS | Status: AC
  Administered 2012-07-11: 09:00:00 via INTRAVENOUS

## 2012-07-11 MED ORDER — SODIUM CHLORIDE 0.9 % IJ SOLN
10.0000 mL | INTRAMUSCULAR | Status: DC | PRN
  Administered 2012-07-11: 10 mL
  Filled 2012-07-11: qty 10

## 2012-07-11 MED ORDER — ONDANSETRON 8 MG/50ML IVPB (CHCC)
8.0000 mg | Freq: Once | INTRAVENOUS | Status: AC
  Administered 2012-07-11: 8 mg via INTRAVENOUS

## 2012-07-11 MED ORDER — HEPARIN SOD (PORK) LOCK FLUSH 100 UNIT/ML IV SOLN
500.0000 [IU] | Freq: Once | INTRAVENOUS | Status: AC | PRN
  Administered 2012-07-11: 500 [IU]
  Filled 2012-07-11: qty 5

## 2012-07-11 MED ORDER — DEXAMETHASONE SODIUM PHOSPHATE 10 MG/ML IJ SOLN
10.0000 mg | Freq: Once | INTRAMUSCULAR | Status: AC
  Administered 2012-07-11: 10 mg via INTRAVENOUS

## 2012-07-11 MED ORDER — DOXORUBICIN HCL LIPOSOMAL CHEMO INJECTION 2 MG/ML
40.0000 mg/m2 | Freq: Once | INTRAVENOUS | Status: AC
  Administered 2012-07-11: 62 mg via INTRAVENOUS
  Filled 2012-07-11: qty 31

## 2012-07-11 NOTE — Patient Instructions (Addendum)
[facility] Discharge Instructions for Patients Receiving Chemotherapy  Today you received the following chemotherapy agents doxil  To help prevent nausea and vomiting after your treatment, we encourage you to take your nausea medication as prescribed Begin taking it at 3pm and take it as often as prescribed for the next 48 hours if needed\[id number]\.   If you develop nausea and vomiting that is not controlled by your nausea medication, call the clinic. If it is after clinic hours your family physician or the after hours number for the clinic or go to the Emergency Department.   BELOW ARE SYMPTOMS THAT SHOULD BE REPORTED IMMEDIATELY:  *FEVER GREATER THAN 100.5 F  *CHILLS WITH OR WITHOUT FEVER  NAUSEA AND VOMITING THAT IS NOT CONTROLLED WITH YOUR NAUSEA MEDICATION  *UNUSUAL SHORTNESS OF BREATH  *UNUSUAL BRUISING OR BLEEDING  TENDERNESS IN MOUTH AND THROAT WITH OR WITHOUT PRESENCE OF ULCERS  *URINARY PROBLEMS  *BOWEL PROBLEMS  UNUSUAL RASH Items with * indicate a potential emergency and should be followed up as soon as possible.  One of the nurses will contact you 24 hours after your treatment. Please let the nurse know about any problems that you may have experienced. Feel free to call the clinic you have any questions or concerns. The clinic phone number is [phone].   I have been informed and understand all the instructions given to me. I know to contact the clinic, my physician, or go to the Emergency Department if any problems should occur. I do not have any questions at this time, but understand that I may call the clinic during office hours   should I have any questions or need assistance in obtaining follow up care.    __________________________________________  _____________  __________ Signature of Patient or Authorized Representative            Date                   Time    __________________________________________ Nurse's  Signature

## 2012-07-17 ENCOUNTER — Ambulatory Visit: Payer: Medicare Other | Admitting: Physician Assistant

## 2012-07-17 ENCOUNTER — Other Ambulatory Visit: Payer: Medicare Other | Admitting: Lab

## 2012-07-18 ENCOUNTER — Other Ambulatory Visit: Payer: Medicare Other | Admitting: Lab

## 2012-07-18 ENCOUNTER — Telehealth: Payer: Self-pay | Admitting: *Deleted

## 2012-07-18 NOTE — Telephone Encounter (Signed)
Pt left message stating " I am doing wonderful after my new medication ". " have felt wonderful and even the day after my treatment I had no nausea ".  " since I am feeling so good I don't think I need to be seen- so I am calling to cancel my appts for lab today and AB tomorrow ".  This RN reviewed chart and noted doxil

## 2012-07-19 ENCOUNTER — Ambulatory Visit: Payer: Medicare Other | Admitting: Physician Assistant

## 2012-08-01 ENCOUNTER — Other Ambulatory Visit: Payer: Medicare Other | Admitting: Lab

## 2012-08-01 ENCOUNTER — Ambulatory Visit: Payer: Medicare Other

## 2012-08-09 ENCOUNTER — Telehealth: Payer: Self-pay | Admitting: Emergency Medicine

## 2012-08-09 NOTE — Telephone Encounter (Signed)
Left message for patient in regards to rescheduling appointments that she canceled last week. Instructed patient to call back so we can set up those appointments according to her scheduling needs.

## 2012-08-14 ENCOUNTER — Encounter: Payer: Self-pay | Admitting: Physician Assistant

## 2012-08-14 ENCOUNTER — Telehealth: Payer: Self-pay | Admitting: *Deleted

## 2012-08-14 ENCOUNTER — Ambulatory Visit (HOSPITAL_BASED_OUTPATIENT_CLINIC_OR_DEPARTMENT_OTHER): Payer: Medicare Other

## 2012-08-14 ENCOUNTER — Other Ambulatory Visit: Payer: Medicare Other | Admitting: Lab

## 2012-08-14 ENCOUNTER — Other Ambulatory Visit (HOSPITAL_BASED_OUTPATIENT_CLINIC_OR_DEPARTMENT_OTHER): Payer: Medicare Other | Admitting: Lab

## 2012-08-14 ENCOUNTER — Ambulatory Visit (HOSPITAL_BASED_OUTPATIENT_CLINIC_OR_DEPARTMENT_OTHER): Payer: Medicare Other | Admitting: Physician Assistant

## 2012-08-14 VITALS — BP 121/76 | HR 61 | Temp 98.7°F | Resp 20 | Ht 59.5 in | Wt 123.2 lb

## 2012-08-14 DIAGNOSIS — C775 Secondary and unspecified malignant neoplasm of intrapelvic lymph nodes: Secondary | ICD-10-CM

## 2012-08-14 DIAGNOSIS — Z5111 Encounter for antineoplastic chemotherapy: Secondary | ICD-10-CM

## 2012-08-14 DIAGNOSIS — C569 Malignant neoplasm of unspecified ovary: Secondary | ICD-10-CM

## 2012-08-14 LAB — CBC WITH DIFFERENTIAL/PLATELET
EOS%: 0.2 % (ref 0.0–7.0)
Eosinophils Absolute: 0 10*3/uL (ref 0.0–0.5)
MCV: 89.8 fL (ref 79.5–101.0)
MONO%: 9.6 % (ref 0.0–14.0)
NEUT#: 3.1 10*3/uL (ref 1.5–6.5)
RBC: 4.13 10*6/uL (ref 3.70–5.45)
RDW: 16.5 % — ABNORMAL HIGH (ref 11.2–14.5)

## 2012-08-14 LAB — COMPREHENSIVE METABOLIC PANEL (CC13)
ALT: 14 U/L (ref 0–55)
Albumin: 3.7 g/dL (ref 3.5–5.0)
Alkaline Phosphatase: 83 U/L (ref 40–150)
CO2: 26 mEq/L (ref 22–29)
Chloride: 106 mEq/L (ref 98–107)
Glucose: 96 mg/dl (ref 70–99)
Potassium: 3.8 mEq/L (ref 3.5–5.1)
Sodium: 140 mEq/L (ref 136–145)

## 2012-08-14 MED ORDER — HEPARIN SOD (PORK) LOCK FLUSH 100 UNIT/ML IV SOLN
500.0000 [IU] | Freq: Once | INTRAVENOUS | Status: AC | PRN
  Administered 2012-08-14: 500 [IU]
  Filled 2012-08-14: qty 5

## 2012-08-14 MED ORDER — SODIUM CHLORIDE 0.9 % IJ SOLN
10.0000 mL | INTRAMUSCULAR | Status: DC | PRN
  Administered 2012-08-14: 10 mL
  Filled 2012-08-14: qty 10

## 2012-08-14 MED ORDER — ONDANSETRON HCL 8 MG PO TABS: 8.0000 mg | ORAL_TABLET | Freq: Two times a day (BID) | ORAL | Status: AC | PRN

## 2012-08-14 MED ORDER — SODIUM CHLORIDE 0.9 % IV SOLN
Freq: Once | INTRAVENOUS | Status: AC
  Administered 2012-08-14: 15:00:00 via INTRAVENOUS

## 2012-08-14 MED ORDER — DEXAMETHASONE SODIUM PHOSPHATE 10 MG/ML IJ SOLN
10.0000 mg | Freq: Once | INTRAMUSCULAR | Status: AC
  Administered 2012-08-14: 10 mg via INTRAVENOUS

## 2012-08-14 MED ORDER — ONDANSETRON 8 MG/50ML IVPB (CHCC)
8.0000 mg | Freq: Once | INTRAVENOUS | Status: AC
  Administered 2012-08-14: 8 mg via INTRAVENOUS

## 2012-08-14 MED ORDER — DOXORUBICIN HCL LIPOSOMAL CHEMO INJECTION 2 MG/ML
40.0000 mg/m2 | Freq: Once | INTRAVENOUS | Status: AC
  Administered 2012-08-14: 62 mg via INTRAVENOUS
  Filled 2012-08-14: qty 31

## 2012-08-14 NOTE — Patient Instructions (Signed)
Patient aware of next appointment; discharged home with no complaints. 

## 2012-08-14 NOTE — Progress Notes (Signed)
ID: [name]   DOB: 07/08/1941  MR#: [id number]  JYN#:[id number]  HISTORY OF PRESENT ILLNESS: Davy Faught was initially diagnosed with ovarian cancer in July of 2002. She underwent debulking surgery at that time for what proved to be a 4 of 18 lymph nodes positive disease. She was initially treated with 6 cycles of paclitaxel and carboplatin adjuvantly, completed in December of 2002.  Patient has had multiple recurrences since December of 2002, and has been treated with multiple agents as noted below.  INTERVAL HISTORY: Maydelin Deming returns today for followup of her ovarian cancer. 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 She is now ready for day 1 cycle 2.  She tells me that if she had not known she was in the office the previous day, she would not have known on day 2 that she had gotten any chemotherapy at all. She has really felt quite well. She takes Compazine and/or ondansetron as needed for nausea. She requests a refill on the ondansetron today. Fortunately she's had very little nausea and no emesis since cycle 1.   REVIEW OF SYSTEMS: Shantella Blubaugh energy level is good. She denies any fevers or chills. Her ankles and fingers feel a little "puffy" sometimes, but the swelling has improved. She decreased the sodium in her diet, but really didn't notice much change in the swelling. She's had no increased shortness of breath at all. No cough or phlegm production. No change in bowel or bladder habits. She has some occasional cramping which is not new. 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 It is not affecting any of her day-to-day activities. No additional myalgias or arthralgias, and she also denies any abdominal or pelvic pain. No abnormal headaches.  A detailed review of systems is otherwise stable and noncontributory.   PAST MEDICAL HISTORY: Past Medical History    Diagnosis Date  . Ovarian cancer 11/17/2011    PAST SURGICAL HISTORY: No past surgical history on file.  FAMILY HISTORY No family history on file.  GYNECOLOGIC HISTORY:  SOCIAL HISTORY: Xylina Rhoads lives with her mother. Her daughter [name] lives in [place] and is Interior and spatial designer of the epidemiology and genetics Center there; she is married with 2 children. Son  [name] lives in [place]. He has 4 children of his own.   ADVANCED DIRECTIVES:  HEALTH MAINTENANCE: History  Substance Use Topics  . Smoking status: Never Smoker   . Smokeless tobacco: Not on file  . Alcohol Use: No     Colonoscopy:  PAP:  Bone density:  Lipid panel:  Allergies  Allergen Reactions  . Penicillins Rash    Current Outpatient Prescriptions  Medication Sig Dispense Refill  . ibuprofen (ADVIL,MOTRIN) 800 MG tablet Take 800 mg by mouth every 8 (eight) hours as needed.        . lidocaine-prilocaine (EMLA) cream Apply 1 application topically as needed.        . ondansetron (ZOFRAN) 8 MG tablet Take 1 tablet (8 mg total) by mouth every 12 (twelve) hours as needed for nausea.  30 tablet  1  . prochlorperazine (COMPAZINE) 10 MG tablet Take 10 mg by mouth every 6 (six) hours as needed.         No current facility-administered medications for this visit.   Facility-Administered Medications Ordered in Other Visits  Medication Dose Route Frequency Provider  Last Rate Last Dose  . 0.9 %  sodium chloride infusion   Intravenous Once Catalina Gravel, PA 20 mL/hr at 08/14/12 1504    . dexamethasone (DECADRON) injection 10 mg  10 mg Intravenous Once Catalina Gravel, PA   10 mg at 08/14/12 1504  . DOXOrubicin HCL LIPOSOMAL (DOXIL) 62 mg in dextrose 5 % 250 mL chemo infusion  40 mg/m2 (Treatment Plan Actual) Intravenous Once Catalina Gravel, PA 281 mL/hr at 08/14/12 1547 62 mg at 08/14/12 1547  . heparin lock flush 100 unit/mL  500 Units Intracatheter Once PRN Catalina Gravel, PA   500 Units at 08/14/12 1549  . ondansetron (ZOFRAN) IVPB 8 mg  8  mg Intravenous Once Catalina Gravel, PA   8 mg at 08/14/12 1504  . sodium chloride 0.9 % injection 10 mL  10 mL Intracatheter PRN Catalina Gravel, PA   10 mL at 08/14/12 1549    OBJECTIVE: Middle-aged white woman in no acute distress Filed Vitals:   08/14/12 1413  BP: 121/76  Pulse: 61  Temp: 98.7 F (37.1 C)  Resp: 20     Body mass index is 24.47 kg/(m^2).    ECOG FS: 1 Filed Weights   08/14/12 1413  Weight: 123 lb 3.2 oz (55.883 kg)    Sclerae unicteric Oropharynx clear No peripheral adenopathy Lungs no rales or rhonchi Heart regular rate and rhythm Abd soft, nontender, positive bowel sounds, no obvious swelling or ascites MSK no focal spinal tenderness, no peripheral edema Neuro: nonfocal., Alert and oriented x3  LAB RESULTS: Lab Results  Component Value Date   WBC 5.2 08/14/2012   NEUTROABS 3.1 08/14/2012   HGB 12.6 08/14/2012   HCT 37.1 08/14/2012   MCV 89.8 08/14/2012   PLT 141* 08/14/2012      Chemistry      Component Value Date/Time   NA 140 08/14/2012 1336   NA 140 07/04/2012 0832   K 3.8 08/14/2012 1336   K 4.2 07/04/2012 0832   CL 106 08/14/2012 1336   CL 106 07/04/2012 0832   CO2 26 08/14/2012 1336   CO2 26 07/04/2012 0832   BUN 15.0 08/14/2012 1336   BUN 17 07/04/2012 0832   CREATININE 0.8 08/14/2012 1336   CREATININE 0.82 07/04/2012 0832      Component Value Date/Time   CALCIUM 9.3 08/14/2012 1336   CALCIUM 9.2 07/04/2012 0832   ALKPHOS 83 08/14/2012 1336   ALKPHOS 71 07/04/2012 0832   AST 17 08/14/2012 1336   AST 18 07/04/2012 0832   ALT 14 08/14/2012 1336   ALT 12 07/04/2012 0832   BILITOT 0.60 08/14/2012 1336   BILITOT 0.7 07/04/2012 0832     CA125 is being repeated today. Most recent lab on 07/04/2012 showed an elevated tumor marker of 1860. This was up from 1307 in May, and 1158 in March.  STUDIES: Echocardiogram 07/04/2012 showed a normal left ventricular size and function, the ejection fraction being 55-60%. Mammography February 2013 was benign  ASSESSMENT:  71 year old [place] woman   (1)  with a history of ovarian cancer dating back to July 2002 when she underwent debulking surgery for what proved to be 4/18 lymph node positive disease.   (2)  She received 6 cycles of paclitaxel/carboplatin adjuvantly completed in December 2002.   (3)  She had her first recurrence in December 2005 treated with paclitaxel;   (4)  second recurrence May 2007 treated with [place] and paclitaxel;   (5)  third recurrence  January 2010 treated with topotecan and Gemzar;   (6)  fourth recurrence January 2012 treated with single-agent carboplatin (January to August 2012) with initial response, then plateau; with progression January 2013 (less than 6 months after last carbo dose) treated with single-agent gemzar given Q2 weeks (January to April 2013)   (7)  now with evidence of progression once again, being treated with Doxil given every 3 weeks, beginning in July 2013.  PLAN: Brittiany Wiehe will receive her second dose of Doxil today after a brief delay due to her job. She is planning a trip out of town later this month, and will not be returning until mid September. Accordingly, we will plan to delay her third dose of Doxil by one week, and she will see me on September 23 prior to day 1 cycle 3. If she returns from [place] or earlier, she will let us know and we can certainly see her sooner. After September 23, she should be "back on schedule" and will be treated every 3 weeks as scheduled.      Shreena Baines will see [place] on the day of each treatment. She will see Dr. Darnelle Catalan in mid October, and will need a repeat echo around that time.  She knows to call for any problems that may develop before the next visit    Lea Baine    08/14/2012

## 2012-08-14 NOTE — Telephone Encounter (Signed)
Per staff message and POF I have scheduled appts.  JMW  

## 2012-08-15 ENCOUNTER — Encounter: Payer: Self-pay | Admitting: *Deleted

## 2012-08-15 ENCOUNTER — Encounter: Payer: Self-pay | Admitting: Oncology

## 2012-08-15 NOTE — Progress Notes (Signed)
RECEIVED A FAX FROM A PHARMACY CONCERNING A PRIOR AUTHORIZATION FOR ONDANSETRON. THIS REQUEST WAS PLACED IN THE MANAGED CARE BIN.

## 2012-08-15 NOTE — Progress Notes (Signed)
Faxed prior auth form to [employer] [place] for ondansetron 8mg  30 tabs.

## 2012-08-15 NOTE — Progress Notes (Signed)
[employer] approved ondansetron 8mg  30 tabs from 08/15/12-05/15/13.

## 2012-08-17 ENCOUNTER — Telehealth: Payer: Self-pay | Admitting: Oncology

## 2012-08-17 NOTE — Telephone Encounter (Signed)
lmonvm adviisng the pt that her sept,oct schedules are ready to be picked up when she comes in to the office

## 2012-08-22 ENCOUNTER — Ambulatory Visit: Payer: Medicare Other

## 2012-08-22 ENCOUNTER — Other Ambulatory Visit: Payer: Medicare Other | Admitting: Lab

## 2012-09-01 ENCOUNTER — Other Ambulatory Visit: Payer: Self-pay | Admitting: Emergency Medicine

## 2012-09-11 ENCOUNTER — Telehealth: Payer: Self-pay | Admitting: *Deleted

## 2012-09-11 ENCOUNTER — Ambulatory Visit (HOSPITAL_BASED_OUTPATIENT_CLINIC_OR_DEPARTMENT_OTHER): Payer: Medicare Other

## 2012-09-11 ENCOUNTER — Other Ambulatory Visit (HOSPITAL_BASED_OUTPATIENT_CLINIC_OR_DEPARTMENT_OTHER): Payer: Medicare Other | Admitting: Lab

## 2012-09-11 ENCOUNTER — Encounter: Payer: Self-pay | Admitting: Physician Assistant

## 2012-09-11 ENCOUNTER — Ambulatory Visit (HOSPITAL_BASED_OUTPATIENT_CLINIC_OR_DEPARTMENT_OTHER): Payer: Medicare Other | Admitting: Physician Assistant

## 2012-09-11 VITALS — BP 126/76 | HR 68 | Temp 97.5°F | Resp 20 | Ht 59.5 in | Wt 123.4 lb

## 2012-09-11 DIAGNOSIS — C775 Secondary and unspecified malignant neoplasm of intrapelvic lymph nodes: Secondary | ICD-10-CM

## 2012-09-11 DIAGNOSIS — K1230 Oral mucositis (ulcerative), unspecified: Secondary | ICD-10-CM

## 2012-09-11 DIAGNOSIS — C569 Malignant neoplasm of unspecified ovary: Secondary | ICD-10-CM

## 2012-09-11 DIAGNOSIS — K121 Other forms of stomatitis: Secondary | ICD-10-CM

## 2012-09-11 DIAGNOSIS — Z5111 Encounter for antineoplastic chemotherapy: Secondary | ICD-10-CM

## 2012-09-11 LAB — COMPREHENSIVE METABOLIC PANEL (CC13)
Albumin: 3.8 g/dL (ref 3.5–5.0)
Alkaline Phosphatase: 76 U/L (ref 40–150)
BUN: 13 mg/dL (ref 7.0–26.0)
Glucose: 88 mg/dl (ref 70–99)
Total Bilirubin: 0.6 mg/dL (ref 0.20–1.20)

## 2012-09-11 LAB — CBC WITH DIFFERENTIAL/PLATELET
Basophils Absolute: 0 10*3/uL (ref 0.0–0.1)
EOS%: 0.2 % (ref 0.0–7.0)
HCT: 38.4 % (ref 34.8–46.6)
HGB: 13 g/dL (ref 11.6–15.9)
MCH: 31.9 pg (ref 25.1–34.0)
MCV: 94.3 fL (ref 79.5–101.0)
MONO%: 9.5 % (ref 0.0–14.0)
NEUT%: 53.8 % (ref 38.4–76.8)

## 2012-09-11 MED ORDER — ONDANSETRON 8 MG/50ML IVPB (CHCC)
8.0000 mg | Freq: Once | INTRAVENOUS | Status: AC
  Administered 2012-09-11: 8 mg via INTRAVENOUS

## 2012-09-11 MED ORDER — DOXORUBICIN HCL LIPOSOMAL CHEMO INJECTION 2 MG/ML
39.0000 mg/m2 | Freq: Once | INTRAVENOUS | Status: AC
  Administered 2012-09-11: 60 mg via INTRAVENOUS
  Filled 2012-09-11: qty 30

## 2012-09-11 MED ORDER — MAGIC MOUTHWASH W/LIDOCAINE: 5.0000 mL | Freq: Four times a day (QID) | ORAL | Status: DC | PRN

## 2012-09-11 MED ORDER — DEXAMETHASONE SODIUM PHOSPHATE 10 MG/ML IJ SOLN
10.0000 mg | Freq: Once | INTRAMUSCULAR | Status: AC
  Administered 2012-09-11: 10 mg via INTRAVENOUS

## 2012-09-11 MED ORDER — ACYCLOVIR 400 MG PO TABS: 400.0000 mg | ORAL_TABLET | Freq: Two times a day (BID) | ORAL | Status: DC

## 2012-09-11 MED ORDER — SODIUM CHLORIDE 0.9 % IV SOLN
Freq: Once | INTRAVENOUS | Status: AC
  Administered 2012-09-11: 13:00:00 via INTRAVENOUS

## 2012-09-11 MED ORDER — HEPARIN SOD (PORK) LOCK FLUSH 100 UNIT/ML IV SOLN
500.0000 [IU] | Freq: Once | INTRAVENOUS | Status: AC | PRN
  Administered 2012-09-11: 500 [IU]
  Filled 2012-09-11: qty 5

## 2012-09-11 MED ORDER — SODIUM CHLORIDE 0.9 % IJ SOLN
10.0000 mL | INTRAMUSCULAR | Status: DC | PRN
Start: 2012-09-11 — End: 2012-09-11
  Administered 2012-09-11: 10 mL
  Filled 2012-09-11: qty 10

## 2012-09-11 NOTE — Telephone Encounter (Signed)
CANCEL appt on 9/24 and 10/15 and 11/5; ADD lab, AB and chemo on 11/4  Patient is aware of the all appointments

## 2012-09-11 NOTE — Progress Notes (Signed)
ID: [name]   DOB: [birth year]  MR#: [id number]  JXB#:[id number]  HISTORY OF PRESENT ILLNESS: Cassandra Nichols was initially diagnosed with ovarian cancer in July of 2002. She underwent debulking surgery at that time for what proved to be a 4 of 18 lymph nodes positive disease. She was initially treated with 6 cycles of paclitaxel and carboplatin adjuvantly, completed in December of 2002.  Patient has had multiple recurrences since December of 2002, and has been treated with multiple agents as noted below.  INTERVAL HISTORY: Cassandra Nichols returns today for followup of her ovarian cancer. She is due for day 1 cycle 3 of Doxil today, the plan being to treat every 3 weeks. 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 There are some continuing health concerns with regards to her aunt, and Cassandra Nichols may need to take another trip to [place] in the next couple of weeks.  Interval history is otherwise unremarkable, and [name] is feeling well today.  REVIEW OF SYSTEMS: Cassandra Nichols energy level is good. She denies any fevers or chills. She has had no rashes, skin changes, or peripheral swelling. No signs of abnormal bleeding.  She's had no increased shortness of breath at all. No cough or phlegm production. No nausea or change in bowel or bladder habits. She has some occasional cramping which is not new. 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 It is not affecting any of her day-to-day activities. No additional myalgias or arthralgias, and she also denies any abdominal or pelvic pain. No abnormal headaches or dizziness.  A detailed review of systems is otherwise stable and noncontributory.   PAST MEDICAL HISTORY: Past Medical History  Diagnosis Date  . Ovarian cancer 11/17/2011    PAST SURGICAL HISTORY: No past surgical history on file.  FAMILY HISTORY No family history on  file.  GYNECOLOGIC HISTORY:  SOCIAL HISTORY: Cassandra Nichols currently lives alone. Her daughter Cassandra Nichols lives in [place] and is Interior and spatial designer of the epidemiology and genetics Center there; she is married with 2 children. Son  Cassandra Nichols lives in [place]. He has 4 children of his own.   ADVANCED DIRECTIVES:  HEALTH MAINTENANCE: History  Substance Use Topics  . Smoking status: Never Smoker   . Smokeless tobacco: Not on file  . Alcohol Use: No     Colonoscopy:  PAP:  Bone density:  Lipid panel:  Allergies  Allergen Reactions  . Penicillins Rash    Current Outpatient Prescriptions  Medication Sig Dispense Refill  . lidocaine-prilocaine (EMLA) cream Apply 1 application topically as needed.        Marland Kitchen acyclovir (ZOVIRAX) 400 MG tablet Take 1 tablet (400 mg total) by mouth 2 (two) times daily.  60 tablet  2  . Alum & Mag Hydroxide-Simeth (MAGIC MOUTHWASH W/LIDOCAINE) SOLN Take 5 mLs by mouth 4 (four) times daily as needed.  240 mL  2  . ibuprofen (ADVIL,MOTRIN) 800 MG tablet Take 800 mg by mouth every 8 (eight) hours as needed.        . ondansetron (ZOFRAN) 8 MG tablet       . prochlorperazine (COMPAZINE) 10 MG tablet Take 10 mg by mouth every 6 (six) hours as needed.          OBJECTIVE: Middle-aged white woman in no acute distress Filed Vitals:   09/11/12 1143  BP: 126/76  Pulse: 68  Temp: 97.5 F (36.4 C)  Resp: 20     Body mass index is 24.51 kg/(m^2).    ECOG FS: 1 Filed Weights   09/11/12 1143  Weight: 123 lb 6.4 oz (55.974 kg)   Sclerae unicteric Oropharynx clear, no current ulcerations No peripheral adenopathy Lungs no rales or rhonchi Heart regular rate and rhythm Abd soft, nontender, positive bowel sounds, no obvious swelling or ascites MSK no focal spinal tenderness, no peripheral edema Neuro: nonfocal., Alert and oriented x3  LAB RESULTS: Lab Results  Component Value Date   WBC 4.6 09/11/2012   NEUTROABS 2.5 09/11/2012   HGB 13.0 09/11/2012   HCT 38.4 09/11/2012   MCV 94.3  09/11/2012   PLT 170 09/11/2012      Chemistry      Component Value Date/Time   NA 140 08/14/2012 1336   NA 140 07/04/2012 0832   K 3.8 08/14/2012 1336   K 4.2 07/04/2012 0832   CL 106 08/14/2012 1336   CL 106 07/04/2012 0832   CO2 26 08/14/2012 1336   CO2 26 07/04/2012 0832   BUN 15.0 08/14/2012 1336   BUN 17 07/04/2012 0832   CREATININE 0.8 08/14/2012 1336   CREATININE 0.82 07/04/2012 0832      Component Value Date/Time   CALCIUM 9.3 08/14/2012 1336   CALCIUM 9.2 07/04/2012 0832   ALKPHOS 83 08/14/2012 1336   ALKPHOS 71 07/04/2012 0832   AST 17 08/14/2012 1336   AST 18 07/04/2012 0832   ALT 14 08/14/2012 1336   ALT 12 07/04/2012 0832   BILITOT 0.60 08/14/2012 1336   BILITOT 0.7 07/04/2012 0832     CA125 is being repeated today. Most recent lab on 08/14/2012 was 2710. This was up from 1860 in July, 1307 in May, and 1158 in March.  STUDIES: Echocardiogram 07/04/2012 showed a normal left ventricular size and function, the ejection fraction being 55-60%. Mammography February 2013 was benign  ASSESSMENT: 71 year old [place] woman[place] woman   (1)  with a history of ovarian cancer dating back to July 2002 when she underwent debulking surgery for what proved to be 4/18 lymph node positive disease.   (2)  She received 6 cycles of paclitaxel/carboplatin adjuvantly completed in December 2002.   (3)  She had her first recurrence in December 2005 treated with paclitaxel;   (4)  second recurrence May 2007 treated with [place] and paclitaxel;   (5)  third recurrence January 2010 treated with topotecan and Gemzar;   (6)  fourth recurrence January 2012 treated with single-agent carboplatin (January to August 2012) with initial response, then plateau; with progression January 2013 (less than 6 months after last carbo dose) treated with single-agent gemzar given Q2 weeks (January to April 2013)   (7)  now with evidence of progression once again, being treated with Doxil given every 3 weeks, beginning in July  2013.  PLAN:  Cassandra Nichols will receive her third dose of Doxil today after a brief delay due to being out of town. She is not sure if she will have to return to [place] again in the next couple of weeks, but for now we are keeping her regimen as planned, with Doxil every 3 weeks. She will be due for her next echocardiogram in mid October, and this will be scheduled for her as well. She will see [place] every 3 weeks for repeat labs and followup visits, but knows to call prior to his appointments with any changes or problems.  I have prescribed both acyclovir to take twice daily, and Magic Mouthwash to use as needed for mucositis associated with the Doxil.     Irene Collings    09/11/2012

## 2012-09-11 NOTE — Telephone Encounter (Signed)
Echo in Mid Oct, before appt with GM on 10/14

## 2012-09-11 NOTE — Telephone Encounter (Signed)
Per staff message and POF I have scheduled appt.  JMW  

## 2012-09-11 NOTE — Patient Instructions (Signed)
[facility] Discharge Instructions for Patients Receiving Chemotherapy  Today you received the following chemotherapy agents Doxil To help prevent nausea and vomiting after your treatment, we encourage you to take your nausea medication as prescribed.If you develop nausea and vomiting that is not controlled by your nausea medication, call the clinic. If it is after clinic hours your family physician or the after hours number for the clinic or go to the Emergency Department.   BELOW ARE SYMPTOMS THAT SHOULD BE REPORTED IMMEDIATELY:  *FEVER GREATER THAN 100.5 F  *CHILLS WITH OR WITHOUT FEVER  NAUSEA AND VOMITING THAT IS NOT CONTROLLED WITH YOUR NAUSEA MEDICATION  *UNUSUAL SHORTNESS OF BREATH  *UNUSUAL BRUISING OR BLEEDING  TENDERNESS IN MOUTH AND THROAT WITH OR WITHOUT PRESENCE OF ULCERS  *URINARY PROBLEMS  *BOWEL PROBLEMS  UNUSUAL RASH Items with * indicate a potential emergency and should be followed up as soon as possible.  One of the nurses will contact you 24 hours after your treatment. Please let the nurse know about any problems that you may have experienced. Feel free to call the clinic you have any questions or concerns. The clinic phone number is [phone].   I have been informed and understand all the instructions given to me. I know to contact the clinic, my physician, or go to the Emergency Department if any problems should occur. I do not have any questions at this time, but understand that I may call the clinic during office hours   should I have any questions or need assistance in obtaining follow up care.    __________________________________________  _____________  __________ Signature of Patient or Authorized Representative            Date                   Time    __________________________________________ Nurse's Signature    

## 2012-09-12 ENCOUNTER — Other Ambulatory Visit: Payer: Medicare Other | Admitting: Lab

## 2012-09-12 ENCOUNTER — Ambulatory Visit: Payer: Medicare Other

## 2012-09-28 ENCOUNTER — Ambulatory Visit (HOSPITAL_COMMUNITY): Payer: Medicare Other | Attending: Physician Assistant

## 2012-10-02 ENCOUNTER — Encounter: Payer: Self-pay | Admitting: Oncology

## 2012-10-02 ENCOUNTER — Ambulatory Visit (HOSPITAL_BASED_OUTPATIENT_CLINIC_OR_DEPARTMENT_OTHER): Payer: Medicare Other | Admitting: Oncology

## 2012-10-02 ENCOUNTER — Ambulatory Visit (HOSPITAL_BASED_OUTPATIENT_CLINIC_OR_DEPARTMENT_OTHER): Payer: Medicare Other

## 2012-10-02 ENCOUNTER — Telehealth: Payer: Self-pay | Admitting: *Deleted

## 2012-10-02 ENCOUNTER — Other Ambulatory Visit (HOSPITAL_BASED_OUTPATIENT_CLINIC_OR_DEPARTMENT_OTHER): Payer: Medicare Other | Admitting: Lab

## 2012-10-02 VITALS — BP 123/79 | HR 73 | Temp 98.1°F | Resp 20 | Ht 59.5 in | Wt 125.5 lb

## 2012-10-02 DIAGNOSIS — Z5111 Encounter for antineoplastic chemotherapy: Secondary | ICD-10-CM

## 2012-10-02 DIAGNOSIS — C775 Secondary and unspecified malignant neoplasm of intrapelvic lymph nodes: Secondary | ICD-10-CM

## 2012-10-02 DIAGNOSIS — C569 Malignant neoplasm of unspecified ovary: Secondary | ICD-10-CM

## 2012-10-02 LAB — CBC WITH DIFFERENTIAL/PLATELET
Basophils Absolute: 0 10*3/uL (ref 0.0–0.1)
EOS%: 0.6 % (ref 0.0–7.0)
Eosinophils Absolute: 0 10*3/uL (ref 0.0–0.5)
HCT: 34.8 % (ref 34.8–46.6)
HGB: 11.9 g/dL (ref 11.6–15.9)
MCH: 32.5 pg (ref 25.1–34.0)
MCV: 95.1 fL (ref 79.5–101.0)
MONO%: 14.4 % — ABNORMAL HIGH (ref 0.0–14.0)
NEUT#: 1.2 10*3/uL — ABNORMAL LOW (ref 1.5–6.5)
NEUT%: 37.7 % — ABNORMAL LOW (ref 38.4–76.8)
lymph#: 1.5 10*3/uL (ref 0.9–3.3)

## 2012-10-02 LAB — COMPREHENSIVE METABOLIC PANEL (CC13)
ALT: 18 U/L (ref 0–55)
AST: 19 U/L (ref 5–34)
CO2: 23 mEq/L (ref 22–29)
Chloride: 109 mEq/L — ABNORMAL HIGH (ref 98–107)
Sodium: 140 mEq/L (ref 136–145)
Total Bilirubin: 0.3 mg/dL (ref 0.20–1.20)
Total Protein: 6.3 g/dL — ABNORMAL LOW (ref 6.4–8.3)

## 2012-10-02 MED ORDER — SODIUM CHLORIDE 0.9 % IV SOLN
Freq: Once | INTRAVENOUS | Status: AC
  Administered 2012-10-02: 15:00:00 via INTRAVENOUS

## 2012-10-02 MED ORDER — DEXAMETHASONE SODIUM PHOSPHATE 10 MG/ML IJ SOLN
10.0000 mg | Freq: Once | INTRAMUSCULAR | Status: AC
  Administered 2012-10-02: 10 mg via INTRAVENOUS

## 2012-10-02 MED ORDER — DOXORUBICIN HCL LIPOSOMAL CHEMO INJECTION 2 MG/ML
39.0000 mg/m2 | Freq: Once | INTRAVENOUS | Status: AC
  Administered 2012-10-02: 60 mg via INTRAVENOUS
  Filled 2012-10-02: qty 30

## 2012-10-02 MED ORDER — ONDANSETRON 8 MG/50ML IVPB (CHCC)
8.0000 mg | Freq: Once | INTRAVENOUS | Status: AC
  Administered 2012-10-02: 8 mg via INTRAVENOUS

## 2012-10-02 NOTE — Progress Notes (Signed)
ID: [name]   DOB: July 25, 1941  MR#: [id number]  WUJ#:[id number]  HISTORY OF PRESENT ILLNESS: Cassandra Nichols was initially diagnosed with ovarian cancer in July of 2002. She underwent debulking surgery at that time for what proved to be a 4 of 18 lymph nodes positive disease. She was initially treated with 6 cycles of paclitaxel and carboplatin adjuvantly, completed in December of 2002.  Patient has had multiple recurrences since December of 2002, and has been treated with multiple agents as noted below.  INTERVAL HISTORY: Cassandra Nichols returns today for followup of her ovarian cancer. She is due for day 1 cycle 4 of Doxil today, the plan being to treat every 3 weeks. 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 Now she is "back on track" with her scheduled, and is ready for treatment today.  Interval history is otherwise unremarkable, and Cassandra Nichols is feeling well today. 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 We're trying to accommodate her work schedule, and try to see her as late in the afternoon is as possible.  REVIEW OF SYSTEMS: Preethi Lightfoot energy level is good. She denies any fevers or chills. She has had no rashes, skin changes, or peripheral swelling. No signs of abnormal bleeding.  She's had no increased shortness of breath at all and denies any cough or phlegm production. No nausea or change in bowel or bladder habits.  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 It is not affecting any of her day-to-day activities. No additional myalgias or arthralgias, and she also denies any bloating or abdominal or pelvic pain. No abnormal headaches or dizziness.  A detailed review of systems is otherwise stable and noncontributory.   PAST MEDICAL HISTORY: Past Medical History  Diagnosis Date  . Ovarian cancer 11/17/2011      PAST SURGICAL HISTORY: No past surgical history on file.  FAMILY HISTORY No family history on file.  GYNECOLOGIC HISTORY:  SOCIAL HISTORY: Cassandra Nichols currently lives alone. Her daughter Cassandra Nichols lives in [place] and is Interior and spatial designer of the epidemiology and genetics Center there; she is married with 2 children. Son  Cassandra Nichols lives in [place]. He has 4 children of his own.   ADVANCED DIRECTIVES:  HEALTH MAINTENANCE: History  Substance Use Topics  . Smoking status: Never Smoker   . Smokeless tobacco: Not on file  . Alcohol Use: No     Colonoscopy:  PAP:  Bone density:  Lipid panel:  Allergies  Allergen Reactions  . Penicillins Rash    Current Outpatient Prescriptions  Medication Sig Dispense Refill  . acyclovir (ZOVIRAX) 400 MG tablet Take 1 tablet (400 mg total) by mouth 2 (two) times daily.  60 tablet  2  . Alum & Mag Hydroxide-Simeth (MAGIC MOUTHWASH W/LIDOCAINE) SOLN Take 5 mLs by mouth 4 (four) times daily as needed.  240 mL  2  . lidocaine-prilocaine (EMLA) cream Apply 1 application topically as needed.        Marland Kitchen ibuprofen (ADVIL,MOTRIN) 800 MG tablet Take 800 mg by mouth every 8 (eight) hours as needed.        . ondansetron (ZOFRAN) 8 MG tablet       . prochlorperazine (COMPAZINE) 10 MG tablet Take 10 mg by mouth every 6 (six) hours as needed.  No current facility-administered medications for this visit.   Facility-Administered Medications Ordered in Other Visits  Medication Dose Route Frequency Provider Last Rate Last Dose  . 0.9 %  sodium chloride infusion   Intravenous Once Catalina Gravel, PA 20 mL/hr at 10/02/12 1515    . dexamethasone (DECADRON) injection 10 mg  10 mg Intravenous Once Catalina Gravel, PA   10 mg at 10/02/12 1517  . DOXOrubicin HCL LIPOSOMAL (DOXIL) 60 mg in dextrose 5 % 250 mL chemo infusion  39 mg/m2 (Treatment Plan Actual) Intravenous Once [name], MD      . ondansetron (ZOFRAN) IVPB 8 mg  8 mg Intravenous Once Catalina Gravel, PA   8 mg at  10/02/12 1517    OBJECTIVE: Middle-aged white woman in no acute distress Filed Vitals:   10/02/12 1422  BP: 123/79  Pulse: 73  Temp: 98.1 F (36.7 C)  Resp: 20     Body mass index is 24.92 kg/(m^2).    ECOG FS: 1 Filed Weights   10/02/12 1422  Weight: 125 lb 8 oz (56.926 kg)   Sclerae unicteric Oropharynx clear, no current ulcerations No peripheral adenopathy Lungs no rales or rhonchi Heart regular rate and rhythm Abd soft, nontender, positive bowel sounds, no obvious swelling or ascites MSK no focal spinal tenderness, no peripheral edema Neuro: nonfocal., Alert and oriented x3 Breast exam deferred  LAB RESULTS: Lab Results  Component Value Date   WBC 3.2* 10/02/2012   NEUTROABS 1.2* 10/02/2012   HGB 11.9 10/02/2012   HCT 34.8 10/02/2012   MCV 95.1 10/02/2012   PLT 126* 10/02/2012      Chemistry      Component Value Date/Time   NA 139 09/11/2012 1119   NA 140 07/04/2012 0832   K 3.9 09/11/2012 1119   K 4.2 07/04/2012 0832   CL 106 09/11/2012 1119   CL 106 07/04/2012 0832   CO2 22 09/11/2012 1119   CO2 26 07/04/2012 0832   BUN 13.0 09/11/2012 1119   BUN 17 07/04/2012 0832   CREATININE 0.8 09/11/2012 1119   CREATININE 0.82 07/04/2012 0832      Component Value Date/Time   CALCIUM 9.5 09/11/2012 1119   CALCIUM 9.2 07/04/2012 0832   ALKPHOS 76 09/11/2012 1119   ALKPHOS 71 07/04/2012 0832   AST 20 09/11/2012 1119   AST 18 07/04/2012 0832   ALT 15 09/11/2012 1119   ALT 12 07/04/2012 0832   BILITOT 0.60 09/11/2012 1119   BILITOT 0.7 07/04/2012 0832     CA125:  04/25/2012 1307.0   07/04/2012 1860.0   08/14/2012 2710.1   09/11/2012 2145.0   10/02/2012 pending     STUDIES: Echocardiogram 07/04/2012 showed a normal left ventricular size and function, the ejection fraction being 55-60%.   Mammography February 2013 was benign     ASSESSMENT: 71 year old [place] woman   (1)  with a history of ovarian cancer dating back to July 2002 when she underwent debulking surgery  for what proved to be 4/18 lymph node positive disease.   (2)  She received 6 cycles of paclitaxel/carboplatin adjuvantly completed in December 2002.   (3)  She had her first recurrence in December 2005 treated with paclitaxel;   (4)  second recurrence May 2007 treated with [place] and paclitaxel;   (5)  third recurrence January 2010 treated with topotecan and Gemzar;   (6)  fourth recurrence January 2012 treated with single-agent carboplatin (January to August 2012) with initial response, then plateau; with  progression January 2013 (less than 6 months after last carbo dose) treated with single-agent gemzar given Q2 weeks (January to April 2013)   (7)  now with evidence of progression once again, being treated with Doxil given every 3 weeks, beginning in July 2013.  PLAN:  Cassandra Nichols will proceed with treatment today for her fourth dose of Doxil. Since her white count and platelets are little low today, we'll recheck her CBC only next week on October 21. She was unable to keep her last appointment for her echocardiogram and this is being rescheduled for her as well.  Otherwise, I will see Misao Fackrell again in 3 weeks, November 4, prior to her fifth dose of Doxil. She'll be seeing Dr. Darnelle Catalan in early December, after 6 doses to review her treatment plan.  Anise Harbin voices understanding and agreement with this plan, and will call with any changes or problems prior to her next scheduled appointment here.    Aujanae Mccullum    10/02/2012

## 2012-10-02 NOTE — Patient Instructions (Signed)
[facility] Discharge Instructions for Patients Receiving Chemotherapy  Today you received the following chemotherapy agent Doxorubicin.  To help prevent nausea and vomiting after your treatment, we encourage you to take your nausea medication. Begin taking your nausea medicaiton as often as prescribed for by Dr. Darnelle Catalan.    If you develop nausea and vomiting that is not controlled by your nausea medication, call the clinic. If it is after clinic hours your family physician or the after hours number for the clinic or go to the Emergency Department.   BELOW ARE SYMPTOMS THAT SHOULD BE REPORTED IMMEDIATELY:  *FEVER GREATER THAN 100.5 F  *CHILLS WITH OR WITHOUT FEVER  NAUSEA AND VOMITING THAT IS NOT CONTROLLED WITH YOUR NAUSEA MEDICATION  *UNUSUAL SHORTNESS OF BREATH  *UNUSUAL BRUISING OR BLEEDING  TENDERNESS IN MOUTH AND THROAT WITH OR WITHOUT PRESENCE OF ULCERS  *URINARY PROBLEMS  *BOWEL PROBLEMS  UNUSUAL RASH Items with * indicate a potential emergency and should be followed up as soon as possible.  One of the nurses will contact you 24 hours after your treatment. Please let the nurse know about any problems that you may have experienced. Feel free to call the clinic you have any questions or concerns. The clinic phone number is [phone].   I have been informed and understand all the instructions given to me. I know to contact the clinic, my physician, or go to the Emergency Department if any problems should occur. I do not have any questions at this time, but understand that I may call the clinic during office hours   should I have any questions or need assistance in obtaining follow up care.    __________________________________________  _____________  __________ Signature of Patient or Authorized Representative            Date                   Time    __________________________________________ Nurse's Signature

## 2012-10-02 NOTE — Telephone Encounter (Signed)
Echo, next avail; labs only - late afternoon approx 10/21; lab and infusion 11/25; GM - late afternoon if avail on 11/21/12  Echo 10-09-2012 at [facility] long  Patient aware of all the appointments

## 2012-10-02 NOTE — Telephone Encounter (Signed)
Per staff message and POF I have scheduled appts.  JMW  

## 2012-10-03 ENCOUNTER — Ambulatory Visit: Payer: Medicare Other

## 2012-10-03 ENCOUNTER — Other Ambulatory Visit: Payer: Medicare Other | Admitting: Lab

## 2012-10-09 ENCOUNTER — Other Ambulatory Visit (HOSPITAL_BASED_OUTPATIENT_CLINIC_OR_DEPARTMENT_OTHER): Payer: Medicare Other | Admitting: Lab

## 2012-10-09 ENCOUNTER — Ambulatory Visit (HOSPITAL_COMMUNITY)
Admission: RE | Admit: 2012-10-09 | Discharge: 2012-10-09 | Disposition: A | Discharge status: Home / Self Care | Payer: Medicare Other | Source: Ambulatory Visit | Attending: Oncology | Admitting: Oncology

## 2012-10-09 DIAGNOSIS — C569 Malignant neoplasm of unspecified ovary: Secondary | ICD-10-CM

## 2012-10-09 DIAGNOSIS — Z09 Encounter for follow-up examination after completed treatment for conditions other than malignant neoplasm: Secondary | ICD-10-CM

## 2012-10-09 LAB — CBC WITH DIFFERENTIAL/PLATELET
BASO%: 1.2 % (ref 0.0–2.0)
LYMPH%: 34.5 % (ref 14.0–49.7)
MCHC: 34.6 g/dL (ref 31.5–36.0)
MONO#: 0.4 10*3/uL (ref 0.1–0.9)
Platelets: 153 10*3/uL (ref 145–400)
RBC: 3.65 10*6/uL — ABNORMAL LOW (ref 3.70–5.45)
RDW: 16.2 % — ABNORMAL HIGH (ref 11.2–14.5)
WBC: 4.1 10*3/uL (ref 3.9–10.3)
nRBC: 0 % (ref 0–0)

## 2012-10-09 NOTE — Progress Notes (Signed)
  Echocardiogram 2D Echocardiogram LIMITED has been performed.  ANNIA, SOLOFF 10/09/2012, 9:49 AM

## 2012-10-18 ENCOUNTER — Other Ambulatory Visit: Payer: Self-pay | Admitting: Oncology

## 2012-10-23 ENCOUNTER — Ambulatory Visit (HOSPITAL_BASED_OUTPATIENT_CLINIC_OR_DEPARTMENT_OTHER): Payer: Medicare Other | Admitting: Physician Assistant

## 2012-10-23 ENCOUNTER — Encounter: Payer: Self-pay | Admitting: Physician Assistant

## 2012-10-23 ENCOUNTER — Other Ambulatory Visit (HOSPITAL_BASED_OUTPATIENT_CLINIC_OR_DEPARTMENT_OTHER): Payer: Medicare Other | Admitting: Lab

## 2012-10-23 ENCOUNTER — Telehealth: Payer: Self-pay | Admitting: Oncology

## 2012-10-23 ENCOUNTER — Ambulatory Visit: Payer: Medicare Other

## 2012-10-23 ENCOUNTER — Other Ambulatory Visit: Payer: Self-pay | Admitting: Physician Assistant

## 2012-10-23 VITALS — BP 143/83 | HR 70 | Temp 97.8°F | Resp 20 | Ht 59.5 in | Wt 122.2 lb

## 2012-10-23 DIAGNOSIS — C569 Malignant neoplasm of unspecified ovary: Secondary | ICD-10-CM

## 2012-10-23 DIAGNOSIS — C775 Secondary and unspecified malignant neoplasm of intrapelvic lymph nodes: Secondary | ICD-10-CM

## 2012-10-23 LAB — CBC WITH DIFFERENTIAL/PLATELET
EOS%: 0.6 % (ref 0.0–7.0)
Eosinophils Absolute: 0 10*3/uL (ref 0.0–0.5)
MCH: 35 pg — ABNORMAL HIGH (ref 25.1–34.0)
MCV: 100.7 fL (ref 79.5–101.0)
MONO%: 13.2 % (ref 0.0–14.0)
NEUT#: 1.6 10*3/uL (ref 1.5–6.5)
RBC: 3.69 10*6/uL — ABNORMAL LOW (ref 3.70–5.45)
RDW: 17.3 % — ABNORMAL HIGH (ref 11.2–14.5)

## 2012-10-23 LAB — COMPREHENSIVE METABOLIC PANEL (CC13)
AST: 21 U/L (ref 5–34)
Albumin: 3.8 g/dL (ref 3.5–5.0)
Alkaline Phosphatase: 72 U/L (ref 40–150)
Potassium: 3.7 mEq/L (ref 3.5–5.1)
Sodium: 139 mEq/L (ref 136–145)
Total Protein: 7 g/dL (ref 6.4–8.3)

## 2012-10-23 NOTE — Telephone Encounter (Signed)
gve the pt her nov,dec 2013 appt calendar. Sent michelle a staff message to add the tx appts. Pt is aware of the process.

## 2012-10-23 NOTE — Progress Notes (Signed)
ID: [name]   DOB: 1941-06-01  MR#: [id number]  NWG#:[id number]  HISTORY OF PRESENT ILLNESS: Cassandra Nichols was initially diagnosed with ovarian cancer in July of 2002. She underwent debulking surgery at that time for what proved to be a 70 of 18 lymph nodes positive disease. She was initially treated with 6 cycles of paclitaxel and carboplatin adjuvantly, completed in December of 2002.  Patient has had multiple recurrences since December of 2002, and has been treated with multiple agents as noted below.  INTERVAL HISTORY: Cassandra Nichols returns today for followup of her ovarian cancer. She is due for day 1 cycle 5 of Doxil today, the plan being to treat every 3 weeks. 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   Interval history is notable for Cassandra Nichols having experienced several new side effects over the last couple of weeks. Although she's had no cracking or peeling on the palms of her hands, she has had some significant skin changes which seemed to be directly related to pressure. She's had a sore on the bottom of her left buttock which is healing. She had redness and mild skin breakdown in the inframammary folds bilaterally, right where her bra rubs. She had some cracking between the toes on her right foot after wearing socks. She's also had some slight breakdown on the abdomen, where the waist band of her underwear or rubs. She's been using  Bag Balm very effectively on these areas, all of which are healing.  She also had increased constipation and subsequent gas with abdominal pain. This finally resolved, and she has been using gas-X appropriately. Her bowels are now regular, and she denies any abdominal pain. She's had no signs of abnormal bleeding.   REVIEW OF SYSTEMS: Cassandra Nichols energy level is fair. She denies any fevers or chills. She has had no peripheral swelling. No signs of abnormal bleeding.  She has had mild  mucositis which has been "patch and go". It does improve with the use of acyclovir year. Today she denies any ulcerations. She's having no problems eating or drinking. She's had no problems with nausea or emesis. She's had no increased shortness of breath at all and denies any cough or phlegm production.  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 It is not affecting any of her day-to-day activities. No additional myalgias or arthralgias, and she also denies any bloating or abdominal or pelvic pain. No abnormal headaches or dizziness.  A detailed review of systems is otherwise stable and noncontributory.   PAST MEDICAL HISTORY: Past Medical History  Diagnosis Date  . Ovarian cancer 11/17/2011    PAST SURGICAL HISTORY: History reviewed. No pertinent past surgical history.  FAMILY HISTORY History reviewed. No pertinent family history.  GYNECOLOGIC HISTORY:  SOCIAL HISTORY: [name] currently lives alone. Her daughter [name] lives in [place] and is Interior and spatial designer of the epidemiology and genetics Center there; she is married with 2 children. Son  [name] lives in [place]. He has 4 children of his own.   ADVANCED DIRECTIVES:  HEALTH MAINTENANCE: History  Substance Use Topics  . Smoking status: Never Smoker   . Smokeless tobacco: Not on file  . Alcohol Use: No     Colonoscopy:  PAP:  Bone density:  Lipid panel:  Allergies  Allergen Reactions  . Penicillins Rash  Current Outpatient Prescriptions  Medication Sig Dispense Refill  . acyclovir (ZOVIRAX) 400 MG tablet Take 1 tablet (400 mg total) by mouth 2 (two) times daily.  60 tablet  2  . Alum & Mag Hydroxide-Simeth (MAGIC MOUTHWASH W/LIDOCAINE) SOLN Take 5 mLs by mouth 4 (four) times daily as needed.  240 mL  2  . ibuprofen (ADVIL,MOTRIN) 800 MG tablet Take 800 mg by mouth every 8 (eight) hours as needed.        . lidocaine-prilocaine (EMLA) cream Apply 1 application topically as  needed.        . ondansetron (ZOFRAN) 8 MG tablet       . prochlorperazine (COMPAZINE) 10 MG tablet Take 10 mg by mouth every 6 (six) hours as needed.          OBJECTIVE: Middle-aged white woman in no acute distress Filed Vitals:   10/23/12 0951  BP: 143/83  Pulse: 70  Temp: 97.8 F (36.6 C)  Resp: 20     Body mass index is 24.27 kg/(m^2).    ECOG FS: 1 Filed Weights   10/23/12 0951  Weight: 122 lb 3.2 oz (55.43 kg)   Sclerae unicteric Oropharynx clear, no current ulcerations No peripheral adenopathy Lungs no rales or rhonchi Heart regular rate and rhythm Abd soft, nontender, positive bowel sounds, no obvious swelling or ascites MSK no focal spinal tenderness No peripheral edema Neuro: nonfocal., Alert and oriented x3 Breast exam deferred Skin is notable for erythema and mild skin breakdown showing signs of healing in the left buttock, inframammary folds bilaterally, and the waist. No evidence of PPE   LAB RESULTS: Lab Results  Component Value Date   WBC 3.4* 10/23/2012   NEUTROABS 1.6 10/23/2012   HGB 12.9 10/23/2012   HCT 37.1 10/23/2012   MCV 100.7 10/23/2012   PLT 143* 10/23/2012      Chemistry      Component Value Date/Time   NA 139 10/23/2012 0938   NA 140 07/04/2012 0832   K 3.7 10/23/2012 0938   K 4.2 07/04/2012 0832   CL 108* 10/23/2012 0938   CL 106 07/04/2012 0832   CO2 24 10/23/2012 0938   CO2 26 07/04/2012 0832   BUN 15.0 10/23/2012 0938   BUN 17 07/04/2012 0832   CREATININE 0.8 10/23/2012 0938   CREATININE 0.82 07/04/2012 0832      Component Value Date/Time   CALCIUM 9.9 10/23/2012 0938   CALCIUM 9.2 07/04/2012 0832   ALKPHOS 72 10/23/2012 0938   ALKPHOS 71 07/04/2012 0832   AST 21 10/23/2012 0938   AST 18 07/04/2012 0832   ALT 14 10/23/2012 0938   ALT 12 07/04/2012 0832   BILITOT 0.51 10/23/2012 0938   BILITOT 0.7 07/04/2012 0832     CA125:   04/25/2012 1307.0   07/04/2012 1860.0   08/14/2012 2710.1   09/11/2012 2145.0   10/02/2012 2371.2   10/23/2012 pending     STUDIES: Echocardiogram on 10/09/2012 was stable with ejection fraction of 55-60%.  Mammography February 2013 was benign     ASSESSMENT: 71 year old [place] woman   (1)  with a history of ovarian cancer dating back to July 2002 when she underwent debulking surgery for what proved to be 4/18 lymph node positive disease.   (2)  She received 6 cycles of paclitaxel/carboplatin adjuvantly completed in December 2002.   (3)  She had her first recurrence in December 2005 treated with paclitaxel;   (4)  second recurrence May 2007 treated with [place] and paclitaxel;   (  5)  third recurrence January 2010 treated with topotecan and Gemzar;   (6)  fourth recurrence January 2012 treated with single-agent carboplatin (January to August 2012) with initial response, then plateau; with progression January 2013 (less than 6 months after last carbo dose) treated with single-agent gemzar given Q2 weeks (January to April 2013)   (7)  now with evidence of progression once again, being treated with Doxil given every 3 weeks, beginning in July 2013, several delays in treatment due to patient's schedule  PLAN:  Seriyah Collison does not want to receive treatment today. She starts a new job next week, and accordingly would like to delay her fifth cycle of Doxil until November 18. We will schedule this accordingly. She will keep her appointment with Dr. Darnelle Catalan for followup on December 3, and I will see her again on December 9 in anticipation of her sixth dose of Doxil.   Keren Alverio voices understanding and agreement with this plan, and will call with any changes or problems prior to her next scheduled appointment here.    Lithzy Bernard    10/23/2012

## 2012-10-24 ENCOUNTER — Ambulatory Visit: Payer: Medicare Other

## 2012-10-31 ENCOUNTER — Telehealth: Payer: Self-pay | Admitting: *Deleted

## 2012-10-31 NOTE — Telephone Encounter (Signed)
Per staff message and POF I have scheduled appts.  JMW  

## 2012-11-01 ENCOUNTER — Telehealth: Payer: Self-pay | Admitting: Oncology

## 2012-11-01 NOTE — Telephone Encounter (Signed)
lmonvm adviisng the pt of her nov appts and to pick up a calendar for the rest of her appts.

## 2012-11-06 ENCOUNTER — Other Ambulatory Visit (HOSPITAL_BASED_OUTPATIENT_CLINIC_OR_DEPARTMENT_OTHER): Payer: Medicare Other | Admitting: Lab

## 2012-11-06 ENCOUNTER — Ambulatory Visit (HOSPITAL_BASED_OUTPATIENT_CLINIC_OR_DEPARTMENT_OTHER): Payer: Medicare Other

## 2012-11-06 ENCOUNTER — Telehealth: Payer: Self-pay | Admitting: Oncology

## 2012-11-06 VITALS — BP 132/77 | HR 67 | Temp 97.9°F | Resp 20

## 2012-11-06 DIAGNOSIS — Z5111 Encounter for antineoplastic chemotherapy: Secondary | ICD-10-CM

## 2012-11-06 DIAGNOSIS — C775 Secondary and unspecified malignant neoplasm of intrapelvic lymph nodes: Secondary | ICD-10-CM

## 2012-11-06 DIAGNOSIS — C569 Malignant neoplasm of unspecified ovary: Secondary | ICD-10-CM

## 2012-11-06 LAB — CBC WITH DIFFERENTIAL/PLATELET
BASO%: 0.9 % (ref 0.0–2.0)
Eosinophils Absolute: 0 10*3/uL (ref 0.0–0.5)
HCT: 35.5 % (ref 34.8–46.6)
LYMPH%: 33.1 % (ref 14.0–49.7)
MCHC: 34.7 g/dL (ref 31.5–36.0)
MCV: 100.2 fL (ref 79.5–101.0)
MONO#: 0.5 10*3/uL (ref 0.1–0.9)
MONO%: 12.7 % (ref 0.0–14.0)
NEUT%: 52.9 % (ref 38.4–76.8)
Platelets: 161 10*3/uL (ref 145–400)
WBC: 4.1 10*3/uL (ref 3.9–10.3)

## 2012-11-06 LAB — COMPREHENSIVE METABOLIC PANEL (CC13)
CO2: 27 mEq/L (ref 22–29)
Creatinine: 0.9 mg/dL (ref 0.6–1.1)
Glucose: 95 mg/dl (ref 70–99)
Total Bilirubin: 0.45 mg/dL (ref 0.20–1.20)

## 2012-11-06 MED ORDER — HEPARIN SOD (PORK) LOCK FLUSH 100 UNIT/ML IV SOLN
500.0000 [IU] | Freq: Once | INTRAVENOUS | Status: AC | PRN
  Administered 2012-11-06: 500 [IU]
  Filled 2012-11-06: qty 5

## 2012-11-06 MED ORDER — DOXORUBICIN HCL LIPOSOMAL CHEMO INJECTION 2 MG/ML
39.0000 mg/m2 | Freq: Once | INTRAVENOUS | Status: AC
  Administered 2012-11-06: 60 mg via INTRAVENOUS
  Filled 2012-11-06: qty 30

## 2012-11-06 MED ORDER — ONDANSETRON 8 MG/50ML IVPB (CHCC)
8.0000 mg | Freq: Once | INTRAVENOUS | Status: AC
  Administered 2012-11-06: 8 mg via INTRAVENOUS

## 2012-11-06 MED ORDER — DEXAMETHASONE SODIUM PHOSPHATE 10 MG/ML IJ SOLN
10.0000 mg | Freq: Once | INTRAMUSCULAR | Status: AC
  Administered 2012-11-06: 10 mg via INTRAVENOUS

## 2012-11-06 MED ORDER — SODIUM CHLORIDE 0.9 % IV SOLN
Freq: Once | INTRAVENOUS | Status: AC
  Administered 2012-11-06: 16:00:00 via INTRAVENOUS

## 2012-11-06 MED ORDER — SODIUM CHLORIDE 0.9 % IJ SOLN
10.0000 mL | INTRAMUSCULAR | Status: DC | PRN
  Administered 2012-11-06: 10 mL
  Filled 2012-11-06: qty 10

## 2012-11-06 NOTE — Patient Instructions (Signed)
[facility] Discharge Instructions for Patients Receiving Chemotherapy  Today you received the following chemotherapy agents Doxil  To help prevent nausea and vomiting after your treatment, we encourage you to take your nausea medication.   If you develop nausea and vomiting that is not controlled by your nausea medication, call the clinic. If it is after clinic hours your family physician or the after hours number for the clinic or go to the Emergency Department.   BELOW ARE SYMPTOMS THAT SHOULD BE REPORTED IMMEDIATELY:  *FEVER GREATER THAN 100.5 F  *CHILLS WITH OR WITHOUT FEVER  NAUSEA AND VOMITING THAT IS NOT CONTROLLED WITH YOUR NAUSEA MEDICATION  *UNUSUAL SHORTNESS OF BREATH  *UNUSUAL BRUISING OR BLEEDING  TENDERNESS IN MOUTH AND THROAT WITH OR WITHOUT PRESENCE OF ULCERS  *URINARY PROBLEMS  *BOWEL PROBLEMS  UNUSUAL RASH Items with * indicate a potential emergency and should be followed up as soon as possible.  One of the nurses will contact you 24 hours after your treatment. Please let the nurse know about any problems that you may have experienced. Feel free to call the clinic you have any questions or concerns. The clinic phone number is [phone].   I have been informed and understand all the instructions given to me. I know to contact the clinic, my physician, or go to the Emergency Department if any problems should occur. I do not have any questions at this time, but understand that I may call the clinic during office hours   should I have any questions or need assistance in obtaining follow up care.    __________________________________________  _____________  __________ Signature of Patient or Authorized Representative            Date                   Time    __________________________________________ Nurse's Signature    

## 2012-11-06 NOTE — Telephone Encounter (Signed)
gve the pt her dec 2013 appt calendar °

## 2012-11-13 ENCOUNTER — Other Ambulatory Visit: Payer: Medicare Other | Admitting: Lab

## 2012-11-13 ENCOUNTER — Ambulatory Visit: Payer: Medicare Other

## 2012-11-21 ENCOUNTER — Ambulatory Visit: Payer: Medicare Other | Admitting: Oncology

## 2012-11-21 ENCOUNTER — Other Ambulatory Visit: Payer: Medicare Other | Admitting: Lab

## 2012-11-27 ENCOUNTER — Other Ambulatory Visit (HOSPITAL_BASED_OUTPATIENT_CLINIC_OR_DEPARTMENT_OTHER): Payer: Medicare Other | Admitting: Lab

## 2012-11-27 ENCOUNTER — Telehealth: Payer: Self-pay | Admitting: *Deleted

## 2012-11-27 ENCOUNTER — Ambulatory Visit (HOSPITAL_BASED_OUTPATIENT_CLINIC_OR_DEPARTMENT_OTHER): Payer: Medicare Other

## 2012-11-27 ENCOUNTER — Encounter: Payer: Self-pay | Admitting: Physician Assistant

## 2012-11-27 ENCOUNTER — Ambulatory Visit (HOSPITAL_BASED_OUTPATIENT_CLINIC_OR_DEPARTMENT_OTHER): Payer: Medicare Other | Admitting: Physician Assistant

## 2012-11-27 VITALS — BP 135/79 | HR 69 | Temp 97.9°F | Resp 18 | Ht 59.49 in | Wt 122.2 lb

## 2012-11-27 DIAGNOSIS — Z5111 Encounter for antineoplastic chemotherapy: Secondary | ICD-10-CM

## 2012-11-27 DIAGNOSIS — C569 Malignant neoplasm of unspecified ovary: Secondary | ICD-10-CM

## 2012-11-27 DIAGNOSIS — C775 Secondary and unspecified malignant neoplasm of intrapelvic lymph nodes: Secondary | ICD-10-CM

## 2012-11-27 LAB — COMPREHENSIVE METABOLIC PANEL (CC13)
AST: 19 U/L (ref 5–34)
Albumin: 3.7 g/dL (ref 3.5–5.0)
Alkaline Phosphatase: 85 U/L (ref 40–150)
BUN: 24 mg/dL (ref 7.0–26.0)
Potassium: 4.1 mEq/L (ref 3.5–5.1)

## 2012-11-27 LAB — CBC WITH DIFFERENTIAL/PLATELET
Basophils Absolute: 0 10*3/uL (ref 0.0–0.1)
EOS%: 0 % (ref 0.0–7.0)
MCH: 33.6 pg (ref 25.1–34.0)
MCHC: 33.1 g/dL (ref 31.5–36.0)
MCV: 101.5 fL — ABNORMAL HIGH (ref 79.5–101.0)
MONO%: 3.8 % (ref 0.0–14.0)
RBC: 3.55 10*6/uL — ABNORMAL LOW (ref 3.70–5.45)
RDW: 16.5 % — ABNORMAL HIGH (ref 11.2–14.5)

## 2012-11-27 MED ORDER — DOXORUBICIN HCL LIPOSOMAL CHEMO INJECTION 2 MG/ML
39.0000 mg/m2 | Freq: Once | INTRAVENOUS | Status: AC
  Administered 2012-11-27: 60 mg via INTRAVENOUS
  Filled 2012-11-27: qty 30

## 2012-11-27 MED ORDER — SODIUM CHLORIDE 0.9 % IV SOLN: Freq: Once | INTRAVENOUS | Status: DC

## 2012-11-27 MED ORDER — HEPARIN SOD (PORK) LOCK FLUSH 100 UNIT/ML IV SOLN
500.0000 [IU] | Freq: Once | INTRAVENOUS | Status: AC | PRN
  Administered 2012-11-27: 500 [IU]
  Filled 2012-11-27: qty 5

## 2012-11-27 MED ORDER — SODIUM CHLORIDE 0.9 % IJ SOLN
10.0000 mL | INTRAMUSCULAR | Status: DC | PRN
  Administered 2012-11-27: 10 mL
  Filled 2012-11-27: qty 10

## 2012-11-27 MED ORDER — ONDANSETRON 8 MG/50ML IVPB (CHCC)
8.0000 mg | Freq: Once | INTRAVENOUS | Status: AC
  Administered 2012-11-27: 8 mg via INTRAVENOUS

## 2012-11-27 MED ORDER — DEXAMETHASONE SODIUM PHOSPHATE 10 MG/ML IJ SOLN
10.0000 mg | Freq: Once | INTRAMUSCULAR | Status: AC
  Administered 2012-11-27: 10 mg via INTRAVENOUS

## 2012-11-27 NOTE — Patient Instructions (Addendum)
[facility] Discharge Instructions for Patients Receiving Chemotherapy  Today you received the following chemotherapy agents: doxil  To help prevent nausea and vomiting after your treatment, we encourage you to take your nausea medication.  Take it as often as prescribed.     If you develop nausea and vomiting that is not controlled by your nausea medication, call the clinic. If it is after clinic hours your family physician or the after hours number for the clinic or go to the Emergency Department.   BELOW ARE SYMPTOMS THAT SHOULD BE REPORTED IMMEDIATELY:  *FEVER GREATER THAN 100.5 F  *CHILLS WITH OR WITHOUT FEVER  NAUSEA AND VOMITING THAT IS NOT CONTROLLED WITH YOUR NAUSEA MEDICATION  *UNUSUAL SHORTNESS OF BREATH  *UNUSUAL BRUISING OR BLEEDING  TENDERNESS IN MOUTH AND THROAT WITH OR WITHOUT PRESENCE OF ULCERS  *URINARY PROBLEMS  *BOWEL PROBLEMS  UNUSUAL RASH Items with * indicate a potential emergency and should be followed up as soon as possible.  Feel free to call the clinic you have any questions or concerns. The clinic phone number is [phone].   I have been informed and understand all the instructions given to me. I know to contact the clinic, my physician, or go to the Emergency Department if any problems should occur. I do not have any questions at this time, but understand that I may call the clinic during office hours   should I have any questions or need assistance in obtaining follow up care.    __________________________________________  _____________  __________ Signature of Patient or Authorized Representative            Date                   Time    __________________________________________ Nurse's Signature    

## 2012-11-27 NOTE — Telephone Encounter (Signed)
Per staff message and POF I have scheduled appts. JWM  

## 2012-11-27 NOTE — Progress Notes (Signed)
ID: [name]   DOB: 1941/11/11  MR#: [id number]  WUJ#:[id number]  HISTORY OF PRESENT ILLNESS: Cassandra Nichols was initially diagnosed with ovarian cancer in July of 2002. She underwent debulking surgery at that time for what proved to be a 4 of 18 lymph nodes positive disease. She was initially treated with 6 cycles of paclitaxel and carboplatin adjuvantly, completed in December of 2002.  Patient has had multiple recurrences since December of 2002, and has been treated with multiple agents as noted below.  INTERVAL HISTORY: Cassandra Nichols returns today for followup of her ovarian cancer. She is due for day 1 cycle 6 of Doxil today, the plan being to treat every 3 weeks. 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   Cassandra Nichols received her fifth cycle of Doxil 3 weeks ago on November 18. With her fifth cycle, she experienced no significant side effects. (Recall that she had significant skin changes, constipation, and abdominal pain following cycle 4.) She has some occasional leg cramping. Her toes feel numb and tingly "on and off", but this is not affecting her walking or her day-to-day activities. Her tongue was a little sore, but she continues on the acyclovir and has had no actual ulcerations. She's had no problems eating or drinking and denies any nausea. She's having regular bowel movements with neither diarrhea nor constipation. No abdominal pain.  REVIEW OF SYSTEMS: Cassandra Nichols is fair. 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 She denies any fevers or chills. She has had no peripheral swelling. No signs of abnormal bleeding.   She's had no increased shortness of breath at all and denies any cough or phlegm production.  No new or unusual myalgias or arthralgias, and she also denies any bloating or abdominal or pelvic pain. No abnormal headaches or  dizziness.  A detailed review of systems is otherwise stable and noncontributory.   PAST MEDICAL HISTORY: Past Medical History  Diagnosis Date  . Ovarian cancer 11/17/2011    PAST SURGICAL HISTORY: No past surgical history on file.  FAMILY HISTORY No family history on file.  GYNECOLOGIC HISTORY:  SOCIAL HISTORY: Cassandra Nichols currently lives alone. Her daughter Cassandra Nichols lives in [place] and is Interior and spatial designer of the epidemiology and genetics Center there; she is married with 2 children. Son  Cassandra Nichols lives in [place]. He has 4 children of his own.   ADVANCED DIRECTIVES:  HEALTH MAINTENANCE: History  Substance Use Topics  . Smoking status: Never Smoker   . Smokeless tobacco: Not on file  . Alcohol Use: No     Colonoscopy:  PAP:  Bone density:  Lipid panel:  Allergies  Allergen Reactions  . Penicillins Rash    Current Outpatient Prescriptions  Medication Sig Dispense Refill  . acyclovir (ZOVIRAX) 400 MG tablet Take 1 tablet (400 mg total) by mouth 2 (two) times daily.  60 tablet  2  . Alum & Mag Hydroxide-Simeth (MAGIC MOUTHWASH W/LIDOCAINE) SOLN Take 5 mLs by mouth 4 (four) times daily as needed.  240 mL  2  . ibuprofen (ADVIL,MOTRIN) 800 MG tablet Take 800 mg by mouth every 8 (eight) hours as needed.        . lidocaine-prilocaine (EMLA) cream Apply 1 application topically as needed.        . ondansetron (ZOFRAN) 8 MG tablet       .  prochlorperazine (COMPAZINE) 10 MG tablet Take 10 mg by mouth every 6 (six) hours as needed.          OBJECTIVE: Middle-aged white woman in no acute distress Filed Vitals:   11/27/12 1617  BP: 135/79  Pulse: 69  Temp: 97.9 F (36.6 C)  Resp: 18     Body mass index is 24.28 kg/(m^2).    ECOG FS: 1 Filed Weights   11/27/12 1617  Weight: 122 lb 3.2 oz (55.43 kg)   Sclerae unicteric Oropharynx clear, no current ulcerations No peripheral adenopathy Lungs no rales or rhonchi Heart regular rate and rhythm Abd soft, nontender, positive bowel  sounds, no obvious swelling or ascites MSK no focal spinal tenderness No peripheral edema Neuro: nonfocal., Alert and oriented x3 Breast exam deferred No evidence of PPE   LAB RESULTS: Lab Results  Component Value Date   WBC 4.2 11/27/2012   NEUTROABS 3.4 11/27/2012   HGB 11.9 11/27/2012   HCT 36.1 11/27/2012   MCV 101.5* 11/27/2012   PLT 141* 11/27/2012      Chemistry      Component Value Date/Time   NA 137 11/27/2012 1516   NA 140 07/04/2012 0832   K 4.1 11/27/2012 1516   K 4.2 07/04/2012 0832   CL 106 11/27/2012 1516   CL 106 07/04/2012 0832   CO2 20* 11/27/2012 1516   CO2 26 07/04/2012 0832   BUN 24.0 11/27/2012 1516   BUN 17 07/04/2012 0832   CREATININE 0.8 11/27/2012 1516   CREATININE 0.82 07/04/2012 0832      Component Value Date/Time   CALCIUM 9.1 11/27/2012 1516   CALCIUM 9.2 07/04/2012 0832   ALKPHOS 85 11/27/2012 1516   ALKPHOS 71 07/04/2012 0832   AST 19 11/27/2012 1516   AST 18 07/04/2012 0832   ALT 18 11/27/2012 1516   ALT 12 07/04/2012 0832   BILITOT 0.47 11/27/2012 1516   BILITOT 0.7 07/04/2012 0832     CA125:  04/25/2012 1307.0   07/04/2012 1860.0   08/14/2012 2710.1   09/11/2012 2145.0   10/02/2012 2371.2   10/23/2012 2316.5   11/06/2012 1765.6   11/27/2012 pending  STUDIES: Echocardiogram on 10/09/2012 was stable with ejection fraction of 55-60%.  Mammography February 2013 was benign     ASSESSMENT: 71 year old [place] woman   (1)  with a history of ovarian cancer dating back to July 2002 when she underwent debulking surgery for what proved to be 4/18 lymph node positive disease.   (2)  She received 6 cycles of paclitaxel/carboplatin adjuvantly completed in December 2002.   (3)  She had her first recurrence in December 2005 treated with paclitaxel;   (4)  second recurrence May 2007 treated with [place] and paclitaxel;   (5)  third recurrence January 2010 treated with topotecan and Gemzar;   (6)  fourth recurrence January 2012 treated with  single-agent carboplatin (January to August 2012) with initial response, then plateau; with progression January 2013 (less than 6 months after last carbo dose) treated with single-agent gemzar given Q2 weeks (January to April 2013)   (7)  now with evidence of progression once again, being treated with Doxil given every 3 weeks, beginning in July 2013, several delays in treatment due to patient's schedule  PLAN:  Cassandra Nichols will proceed to treatment today as scheduled for her sixth dose of Doxil. She would be due for her next dose on December 30, but would like to delay that until the first week of January. She is  working next week, going to [employer] the week of Christmas, and anticipates her daughter and grandchildren coming from [place] the week of New Years. Accordingly, she will see Dr. Darnelle Catalan on January 9 in  anticipation of her seventh dose of Doxil.  She is due for her next echocardiogram in late January, and that will be scheduled when she returns for her next appointment.  Cassandra Nichols voices understanding and agreement with this plan, and will call with any changes or problems prior to her next scheduled appointment here.    Cassandra Nichols    11/27/2012

## 2012-11-27 NOTE — Telephone Encounter (Signed)
Gave patient appointment for 12-28-2012  Crissie Figures email to set up patient treatment on 12-28-2012

## 2012-11-29 ENCOUNTER — Other Ambulatory Visit: Payer: Self-pay | Admitting: Oncology

## 2012-11-29 DIAGNOSIS — C569 Malignant neoplasm of unspecified ovary: Secondary | ICD-10-CM

## 2012-12-04 ENCOUNTER — Ambulatory Visit: Payer: Medicare Other | Admitting: Physician Assistant

## 2012-12-19 ENCOUNTER — Other Ambulatory Visit: Payer: Self-pay | Admitting: Physician Assistant

## 2012-12-19 NOTE — Telephone Encounter (Signed)
Rec'd refill error, called pharmacy, they did receive refill.

## 2012-12-28 ENCOUNTER — Other Ambulatory Visit (HOSPITAL_BASED_OUTPATIENT_CLINIC_OR_DEPARTMENT_OTHER): Payer: Medicare Other | Admitting: Lab

## 2012-12-28 ENCOUNTER — Telehealth: Payer: Self-pay | Admitting: Oncology

## 2012-12-28 ENCOUNTER — Ambulatory Visit (HOSPITAL_BASED_OUTPATIENT_CLINIC_OR_DEPARTMENT_OTHER): Payer: Medicare Other

## 2012-12-28 ENCOUNTER — Ambulatory Visit (HOSPITAL_BASED_OUTPATIENT_CLINIC_OR_DEPARTMENT_OTHER): Payer: Medicare Other | Admitting: Oncology

## 2012-12-28 VITALS — BP 131/77 | HR 89 | Temp 98.0°F | Resp 20 | Ht 59.0 in | Wt 124.0 lb

## 2012-12-28 DIAGNOSIS — C569 Malignant neoplasm of unspecified ovary: Secondary | ICD-10-CM

## 2012-12-28 DIAGNOSIS — Z5111 Encounter for antineoplastic chemotherapy: Secondary | ICD-10-CM

## 2012-12-28 LAB — CBC WITH DIFFERENTIAL/PLATELET
BASO%: 0.8 % (ref 0.0–2.0)
Basophils Absolute: 0 10*3/uL (ref 0.0–0.1)
EOS%: 1.3 % (ref 0.0–7.0)
Eosinophils Absolute: 0.1 10*3/uL (ref 0.0–0.5)
HCT: 37.9 % (ref 34.8–46.6)
HGB: 12.7 g/dL (ref 11.6–15.9)
LYMPH%: 25.4 % (ref 14.0–49.7)
MCH: 33.2 pg (ref 25.1–34.0)
MCHC: 33.5 g/dL (ref 31.5–36.0)
MCV: 99 fL (ref 79.5–101.0)
MONO#: 0.5 10*3/uL (ref 0.1–0.9)
MONO%: 12.3 % (ref 0.0–14.0)
NEUT#: 2.3 10*3/uL (ref 1.5–6.5)
NEUT%: 60.2 % (ref 38.4–76.8)
Platelets: 153 10*3/uL (ref 145–400)
RBC: 3.83 10*6/uL (ref 3.70–5.45)
RDW: 15.4 % — ABNORMAL HIGH (ref 11.2–14.5)
WBC: 3.9 10*3/uL (ref 3.9–10.3)
lymph#: 1 10*3/uL (ref 0.9–3.3)

## 2012-12-28 LAB — COMPREHENSIVE METABOLIC PANEL (CC13)
AST: 19 U/L (ref 5–34)
Alkaline Phosphatase: 95 U/L (ref 40–150)
Glucose: 112 mg/dl — ABNORMAL HIGH (ref 70–99)
Sodium: 141 mEq/L (ref 136–145)
Total Bilirubin: 0.53 mg/dL (ref 0.20–1.20)
Total Protein: 7.2 g/dL (ref 6.4–8.3)

## 2012-12-28 MED ORDER — DEXAMETHASONE SODIUM PHOSPHATE 10 MG/ML IJ SOLN
10.0000 mg | Freq: Once | INTRAMUSCULAR | Status: AC
  Administered 2012-12-28: 10 mg via INTRAVENOUS

## 2012-12-28 MED ORDER — HEPARIN SOD (PORK) LOCK FLUSH 100 UNIT/ML IV SOLN
500.0000 [IU] | Freq: Once | INTRAVENOUS | Status: AC | PRN
  Administered 2012-12-28: 500 [IU]
  Filled 2012-12-28: qty 5

## 2012-12-28 MED ORDER — DOXORUBICIN HCL LIPOSOMAL CHEMO INJECTION 2 MG/ML
60.0000 mg | Freq: Once | INTRAVENOUS | Status: AC
  Administered 2012-12-28: 60 mg via INTRAVENOUS
  Filled 2012-12-28: qty 30

## 2012-12-28 MED ORDER — ONDANSETRON 8 MG/50ML IVPB (CHCC)
8.0000 mg | Freq: Once | INTRAVENOUS | Status: AC
  Administered 2012-12-28: 8 mg via INTRAVENOUS

## 2012-12-28 MED ORDER — SODIUM CHLORIDE 0.9 % IJ SOLN
10.0000 mL | INTRAMUSCULAR | Status: DC | PRN
  Administered 2012-12-28: 10 mL
  Filled 2012-12-28: qty 10

## 2012-12-28 MED ORDER — SODIUM CHLORIDE 0.9 % IV SOLN
Freq: Once | INTRAVENOUS | Status: AC
  Administered 2012-12-28: 15:00:00 via INTRAVENOUS

## 2012-12-28 NOTE — Telephone Encounter (Signed)
lmonvm for pt re next appt for 2/6 and mailed schedule for February thru May. Per 1/9 pof pt needs echo before 3/6 appt w/AB. No echo order. Message to desk nurse re needing order.

## 2012-12-28 NOTE — Patient Instructions (Addendum)
[facility] Discharge Instructions for Patients Receiving Chemotherapy  Today you received the following chemotherapy agents doxil  To help prevent nausea and vomiting after your treatment, we encourage you to take your nausea medication if needed Begin taking it at 9pm if needed.   If you develop nausea and vomiting that is not controlled by your nausea medication, call the clinic. If it is after clinic hours your family physician or the after hours number for the clinic or go to the Emergency Department.   BELOW ARE SYMPTOMS THAT SHOULD BE REPORTED IMMEDIATELY:  *FEVER GREATER THAN 100.5 F  *CHILLS WITH OR WITHOUT FEVER  NAUSEA AND VOMITING THAT IS NOT CONTROLLED WITH YOUR NAUSEA MEDICATION  *UNUSUAL SHORTNESS OF BREATH  *UNUSUAL BRUISING OR BLEEDING  TENDERNESS IN MOUTH AND THROAT WITH OR WITHOUT PRESENCE OF ULCERS  *URINARY PROBLEMS  *BOWEL PROBLEMS  UNUSUAL RASH Items with * indicate a potential emergency and should be followed up as soon as possible.  One of the nurses will contact you 24 hours after your treatment. Please let the nurse know about any problems that you may have experienced. Feel free to call the clinic you have any questions or concerns. The clinic phone number is [phone].   I have been informed and understand all the instructions given to me. I know to contact the clinic, my physician, or go to the Emergency Department if any problems should occur. I do not have any questions at this time, but understand that I may call the clinic during office hours   should I have any questions or need assistance in obtaining follow up care.    __________________________________________  _____________  __________ Signature of Patient or Authorized Representative            Date                   Time    __________________________________________ Nurse's Signature

## 2012-12-28 NOTE — Progress Notes (Signed)
ID: [name]   DOB: [birth year]  MR#: [id number]  WUJ#:[id number]  HISTORY OF PRESENT ILLNESS: Cassandra Nichols was initially diagnosed with ovarian cancer in July of 2002. She underwent debulking surgery at that time for what proved to be a 4 of 18 lymph nodes positive disease. She was initially treated with 6 cycles of paclitaxel and carboplatin adjuvantly, completed in December of 2002.  Patient has had multiple recurrences since December of 2002, and has been treated with multiple agents as noted below.  INTERVAL HISTORY: Cassandra Nichols returns today for followup of her ovarian cancer. She did well over the holidays, spending time in [place] with her son and his family, and then taking care of her daughters children around the [place]. She worked part of November and December, which helped her financial situation. She is doing some yoga exercises.  REVIEW OF SYSTEMS: She tolerates her Doxil well, except for problems with Vincent Peyer plantar erythrodysesthesia. Around the third week she gets some peeling and cracking around the fingernail beds in the palms of her hands particularly. She uses bag balm for this. She's noticed that he she wears tied close she will develop "red spots" of over the pressure points, which later becomes source. She can of food cramps of, some urinary stress incontinence, and mild hair loss. Otherwise a detailed review of systems today was noncontributory.   PAST MEDICAL HISTORY: Past Medical History  Diagnosis Date  . Ovarian cancer 11/17/2011    PAST SURGICAL HISTORY: No past surgical history on file.  FAMILY HISTORY No family history on file.  GYNECOLOGIC HISTORY:  SOCIAL HISTORY: Ambrea Nichols currently lives alone. Her daughter Cassandra Nichols lives in [place] and is Interior and spatial designer of the epidemiology and genetics Center there; she is married with 2 children. Son  Cassandra Nichols lives in [place]. He has 4 children of his own.   ADVANCED DIRECTIVES: in place  HEALTH MAINTENANCE: History    Substance Use Topics  . Smoking status: Never Smoker   . Smokeless tobacco: Not on file  . Alcohol Use: No     Colonoscopy:  PAP:  Bone density:  Lipid panel:  Allergies  Allergen Reactions  . Penicillins Rash    Current Outpatient Prescriptions  Medication Sig Dispense Refill  . acyclovir (ZOVIRAX) 400 MG tablet TAKE 1 TABLET (400 MG TOTAL) BY MOUTH 2 (TWO) TIMES DAILY.  60 tablet  2  . Alum & Mag Hydroxide-Simeth (MAGIC MOUTHWASH W/LIDOCAINE) SOLN Take 5 mLs by mouth 4 (four) times daily as needed.  240 mL  2  . dexamethasone (DECADRON) 4 MG tablet TAKE 2 TABLETS BY MOUTH EVERY MORNING AS DIRECTED  10 tablet  1  . ibuprofen (ADVIL,MOTRIN) 800 MG tablet Take 800 mg by mouth every 8 (eight) hours as needed.        . lidocaine-prilocaine (EMLA) cream Apply 1 application topically as needed.        . ondansetron (ZOFRAN) 8 MG tablet       . prochlorperazine (COMPAZINE) 10 MG tablet Take 10 mg by mouth every 6 (six) hours as needed.          OBJECTIVE: Middle-aged white woman in no acute distress Filed Vitals:   12/28/12 1352  BP: 131/77  Pulse: 89  Temp: 98 F (36.7 C)  Resp: 20     Body mass index is 25.04 kg/(m^2).    ECOG FS: 1 Filed Weights   12/28/12 1352  Weight: 124 lb (56.246 kg)   Sclerae unicteric Oropharynx clear, no  current ulcerations No peripheral adenopathy Lungs no rales or rhonchi Heart regular rate and rhythm Abd soft, nontender, positive bowel sounds, no masses palpated MSK no focal spinal tenderness No peripheral edema Neuro: nonfocal., Alert and oriented x3, positive affect Breast exam deferred Skin: Minimal peeling around the nailbeds, slight cracking in the lateral aspect of the right hand. No erythema or ulceration   LAB RESULTS: Lab Results  Component Value Date   WBC 3.9 12/28/2012   NEUTROABS 2.3 12/28/2012   HGB 12.7 12/28/2012   HCT 37.9 12/28/2012   MCV 99.0 12/28/2012   PLT 153 12/28/2012      Chemistry      Component Value Date/Time    NA 137 11/27/2012 1516   NA 140 07/04/2012 0832   K 4.1 11/27/2012 1516   K 4.2 07/04/2012 0832   CL 106 11/27/2012 1516   CL 106 07/04/2012 0832   CO2 20* 11/27/2012 1516   CO2 26 07/04/2012 0832   BUN 24.0 11/27/2012 1516   BUN 17 07/04/2012 0832   CREATININE 0.8 11/27/2012 1516   CREATININE 0.82 07/04/2012 0832      Component Value Date/Time   CALCIUM 9.1 11/27/2012 1516   CALCIUM 9.2 07/04/2012 0832   ALKPHOS 85 11/27/2012 1516   ALKPHOS 71 07/04/2012 0832   AST 19 11/27/2012 1516   AST 18 07/04/2012 0832   ALT 18 11/27/2012 1516   ALT 12 07/04/2012 0832   BILITOT 0.47 11/27/2012 1516   BILITOT 0.7 07/04/2012 0832     Results for Cassandra, Nichols (MRN [id number]) as of 12/31/2012 10:01  Ref. Range 10/02/2012 14:10 10/23/2012 09:38 11/06/2012 15:19 11/27/2012 15:31 12/28/2012 13:34  CA 125 Latest Range: 0.0-30.2 U/mL 2371.2 (H) 2316.5 (H) 1765.6 (H) 1780.5 (H) 1627.4 (H)    STUDIES: Echocardiogram on 10/09/2012 was stable with ejection fraction of 55-60%.  Mammography February 2013 was benign     ASSESSMENT: 72 year old [place] woman   (1)  with a history of ovarian cancer dating back to July 2002 when she underwent debulking surgery for what proved to be 4/18 lymph node positive disease.   (2)  She received 6 cycles of paclitaxel/carboplatin adjuvantly completed in December 2002.   (3)  She had her first recurrence in December 2005 treated with paclitaxel;   (4)  second recurrence May 2007 treated with [place] and paclitaxel;   (5)  third recurrence January 2010 treated with topotecan and Gemzar;   (6)  fourth recurrence January 2012 treated with single-agent carboplatin (January to August 2012) with initial response, then plateau; with progression January 2013 (less than 6 months after last carbo dose) treated with single-agent gemzar given Q2 weeks (January to April 2013), with evidence of progression  (7) Doxil strartedJuly 2013, with evidence of response, and fair tolerance  PLAN:   We're going to continue the Doxil, but move it to every 4 weeks instead of every 3 weeks. I am hopeful this will help the palm or plantar erythrodysesthesia symptoms. We will continue this treatment for the next 3-6 months as tolerated. She will have an echo before that March visit. She knows to call for any problems that may develop before then.   Kemal Amores C    12/28/2012

## 2012-12-29 ENCOUNTER — Other Ambulatory Visit: Payer: Self-pay | Admitting: *Deleted

## 2012-12-29 DIAGNOSIS — C569 Malignant neoplasm of unspecified ovary: Secondary | ICD-10-CM

## 2013-01-05 ENCOUNTER — Telehealth: Payer: Self-pay | Admitting: Oncology

## 2013-01-05 NOTE — Telephone Encounter (Signed)
lmonvm for pt re 2/26 echo and mailed referral. Per Bonita Quin [phone].

## 2013-01-25 ENCOUNTER — Other Ambulatory Visit: Payer: Self-pay | Admitting: Physician Assistant

## 2013-01-25 ENCOUNTER — Other Ambulatory Visit (HOSPITAL_BASED_OUTPATIENT_CLINIC_OR_DEPARTMENT_OTHER): Payer: Medicare Other | Admitting: Lab

## 2013-01-25 ENCOUNTER — Ambulatory Visit (HOSPITAL_BASED_OUTPATIENT_CLINIC_OR_DEPARTMENT_OTHER): Payer: Medicare Other

## 2013-01-25 VITALS — BP 146/71 | HR 89 | Temp 96.9°F | Resp 16

## 2013-01-25 DIAGNOSIS — C569 Malignant neoplasm of unspecified ovary: Secondary | ICD-10-CM

## 2013-01-25 DIAGNOSIS — Z5111 Encounter for antineoplastic chemotherapy: Secondary | ICD-10-CM

## 2013-01-25 LAB — CBC WITH DIFFERENTIAL/PLATELET
Basophils Absolute: 0.1 10*3/uL (ref 0.0–0.1)
Eosinophils Absolute: 0.1 10*3/uL (ref 0.0–0.5)
HGB: 12.6 g/dL (ref 11.6–15.9)
MONO#: 0.6 10*3/uL (ref 0.1–0.9)
NEUT#: 2.3 10*3/uL (ref 1.5–6.5)
RDW: 15.2 % — ABNORMAL HIGH (ref 11.2–14.5)
lymph#: 1.2 10*3/uL (ref 0.9–3.3)

## 2013-01-25 LAB — COMPREHENSIVE METABOLIC PANEL (CC13)
Albumin: 3.1 g/dL — ABNORMAL LOW (ref 3.5–5.0)
Alkaline Phosphatase: 83 U/L (ref 40–150)
CO2: 25 mEq/L (ref 22–29)
Chloride: 107 mEq/L (ref 98–107)
Glucose: 93 mg/dl (ref 70–99)
Potassium: 3.8 mEq/L (ref 3.5–5.1)
Sodium: 140 mEq/L (ref 136–145)
Total Protein: 6.4 g/dL (ref 6.4–8.3)

## 2013-01-25 MED ORDER — HEPARIN SOD (PORK) LOCK FLUSH 100 UNIT/ML IV SOLN
500.0000 [IU] | Freq: Once | INTRAVENOUS | Status: AC | PRN
  Administered 2013-01-25: 500 [IU]
  Filled 2013-01-25: qty 5

## 2013-01-25 MED ORDER — SODIUM CHLORIDE 0.9 % IJ SOLN
10.0000 mL | INTRAMUSCULAR | Status: DC | PRN
  Administered 2013-01-25: 10 mL
  Filled 2013-01-25: qty 10

## 2013-01-25 MED ORDER — ONDANSETRON 8 MG/50ML IVPB (CHCC)
8.0000 mg | Freq: Once | INTRAVENOUS | Status: AC
  Administered 2013-01-25: 8 mg via INTRAVENOUS

## 2013-01-25 MED ORDER — DEXAMETHASONE SODIUM PHOSPHATE 10 MG/ML IJ SOLN
10.0000 mg | Freq: Once | INTRAMUSCULAR | Status: AC
  Administered 2013-01-25: 10 mg via INTRAVENOUS

## 2013-01-25 MED ORDER — SODIUM CHLORIDE 0.9 % IV SOLN
Freq: Once | INTRAVENOUS | Status: AC
  Administered 2013-01-25: 15:00:00 via INTRAVENOUS

## 2013-01-25 MED ORDER — DOXORUBICIN HCL LIPOSOMAL CHEMO INJECTION 2 MG/ML
60.0000 mg | Freq: Once | INTRAVENOUS | Status: AC
  Administered 2013-01-25: 60 mg via INTRAVENOUS
  Filled 2013-01-25: qty 30

## 2013-01-25 NOTE — Patient Instructions (Addendum)
[facility] Discharge Instructions for Patients Receiving Chemotherapy  Today you received the following chemotherapy agents doxil  To help prevent nausea and vomiting after your treatment, we encourage you to take your nausea medication as needed   If you develop nausea and vomiting that is not controlled by your nausea medication, call the clinic. If it is after clinic hours your family physician or the after hours number for the clinic or go to the Emergency Department.   BELOW ARE SYMPTOMS THAT SHOULD BE REPORTED IMMEDIATELY:  *FEVER GREATER THAN 100.5 F  *CHILLS WITH OR WITHOUT FEVER  NAUSEA AND VOMITING THAT IS NOT CONTROLLED WITH YOUR NAUSEA MEDICATION  *UNUSUAL SHORTNESS OF BREATH  *UNUSUAL BRUISING OR BLEEDING  TENDERNESS IN MOUTH AND THROAT WITH OR WITHOUT PRESENCE OF ULCERS  *URINARY PROBLEMS  *BOWEL PROBLEMS  UNUSUAL RASH Items with * indicate a potential emergency and should be followed up as soon as possible.   Feel free to call the clinic you have any questions or concerns. The clinic phone number is [phone].   I have been informed and understand all the instructions given to me. I know to contact the clinic, my physician, or go to the Emergency Department if any problems should occur. I do not have any questions at this time, but understand that I may call the clinic during office hours   should I have any questions or need assistance in obtaining follow up care.    __________________________________________  _____________  __________ Signature of Patient or Authorized Representative            Date                   Time    __________________________________________ Nurse's Signature

## 2013-01-26 ENCOUNTER — Telehealth: Payer: Self-pay | Admitting: Oncology

## 2013-01-26 LAB — CA 125: CA 125: 1439.5 U/mL — ABNORMAL HIGH (ref 0.0–30.2)

## 2013-01-26 NOTE — Telephone Encounter (Signed)
pt needed to r/s echo to 2.19. @ 1:00pm..Marland KitchenMarland KitchenDone

## 2013-02-07 ENCOUNTER — Ambulatory Visit (HOSPITAL_COMMUNITY)
Admission: RE | Admit: 2013-02-07 | Discharge: 2013-02-07 | Disposition: A | Discharge status: Home / Self Care | Payer: Medicare Other | Source: Ambulatory Visit | Attending: Oncology | Admitting: Oncology

## 2013-02-07 DIAGNOSIS — Z09 Encounter for follow-up examination after completed treatment for conditions other than malignant neoplasm: Secondary | ICD-10-CM

## 2013-02-07 DIAGNOSIS — Z79899 Other long term (current) drug therapy: Secondary | ICD-10-CM | POA: Insufficient documentation

## 2013-02-07 DIAGNOSIS — C569 Malignant neoplasm of unspecified ovary: Secondary | ICD-10-CM | POA: Insufficient documentation

## 2013-02-07 DIAGNOSIS — I059 Rheumatic mitral valve disease, unspecified: Secondary | ICD-10-CM | POA: Insufficient documentation

## 2013-02-07 NOTE — Progress Notes (Signed)
  Echocardiogram 2D Echocardiogram has been performed.  Cathie Beams 02/07/2013, 2:27 PM

## 2013-02-13 ENCOUNTER — Telehealth: Payer: Self-pay | Admitting: *Deleted

## 2013-02-13 NOTE — Telephone Encounter (Signed)
No Note

## 2013-02-13 NOTE — Telephone Encounter (Signed)
Vernona Rieger with [facility] (cell [phone]) called asking status of letter for patient to have teeth cleaned.  Letter was faxed per collaborative nurse.  Verbal order given for today's teeth cleaning.

## 2013-02-14 ENCOUNTER — Ambulatory Visit (HOSPITAL_COMMUNITY): Payer: Medicare Other

## 2013-02-16 ENCOUNTER — Telehealth: Payer: Self-pay | Admitting: Oncology

## 2013-02-16 NOTE — Telephone Encounter (Signed)
Pt called today to cx 3/6 appts and is not going to r/s at this time. Per pt she has an opportunity to work so she is going work and if she has opportunuity to r/s the 3/6 appts she will call us. Per pt she will keep her appts 4/3. Added AB to 4/3 lb/tx and pt is aware that due to her cx the 3/6 lb/AB/tx she will need an appt w/AB when she comes for the 4/3 lb/tx. Pt aware of time (1:45pm) on 4/3. Message to desk nurse.

## 2013-02-22 ENCOUNTER — Ambulatory Visit: Payer: Medicare Other | Admitting: Physician Assistant

## 2013-02-22 ENCOUNTER — Ambulatory Visit: Payer: Medicare Other

## 2013-02-22 ENCOUNTER — Other Ambulatory Visit: Payer: Medicare Other | Admitting: Lab

## 2013-03-21 ENCOUNTER — Telehealth: Payer: Self-pay | Admitting: Oncology

## 2013-03-21 NOTE — Telephone Encounter (Signed)
Returned pt's call (message from switchboard) re cx'ing 4/3 appts. appt cx'd. lmonvm for pt to call back to let me know when she can come in. Message to desk nurse.

## 2013-03-22 ENCOUNTER — Ambulatory Visit: Payer: Medicare Other

## 2013-03-22 ENCOUNTER — Ambulatory Visit: Payer: Medicare Other | Admitting: Physician Assistant

## 2013-03-22 ENCOUNTER — Other Ambulatory Visit: Payer: Medicare Other | Admitting: Lab

## 2013-03-22 ENCOUNTER — Telehealth: Payer: Self-pay | Admitting: Oncology

## 2013-03-22 NOTE — Telephone Encounter (Signed)
Pt called today to r/s 4/3 appts. Per pt appts were r/s to 4/22. No availability w/GM or AB. Per AB ok w/[facility]. Pt aware she will be seeing [facility] and will get more appts @ 4/22 visit. Lb/tx for 5/1 cxd.

## 2013-03-31 ENCOUNTER — Other Ambulatory Visit: Payer: Self-pay | Admitting: Physician Assistant

## 2013-04-09 ENCOUNTER — Encounter: Payer: Self-pay | Admitting: Lab

## 2013-04-10 ENCOUNTER — Ambulatory Visit (HOSPITAL_BASED_OUTPATIENT_CLINIC_OR_DEPARTMENT_OTHER): Payer: Medicare Other | Admitting: Family

## 2013-04-10 ENCOUNTER — Encounter: Payer: Self-pay | Admitting: Family

## 2013-04-10 ENCOUNTER — Telehealth: Payer: Self-pay | Admitting: *Deleted

## 2013-04-10 ENCOUNTER — Ambulatory Visit (HOSPITAL_BASED_OUTPATIENT_CLINIC_OR_DEPARTMENT_OTHER): Payer: Medicare Other

## 2013-04-10 ENCOUNTER — Other Ambulatory Visit (HOSPITAL_BASED_OUTPATIENT_CLINIC_OR_DEPARTMENT_OTHER): Payer: Medicare Other | Admitting: Lab

## 2013-04-10 VITALS — BP 136/76 | HR 76 | Temp 98.2°F | Resp 20 | Ht 59.0 in | Wt 125.9 lb

## 2013-04-10 DIAGNOSIS — C569 Malignant neoplasm of unspecified ovary: Secondary | ICD-10-CM

## 2013-04-10 DIAGNOSIS — Z5111 Encounter for antineoplastic chemotherapy: Secondary | ICD-10-CM

## 2013-04-10 DIAGNOSIS — Z1231 Encounter for screening mammogram for malignant neoplasm of breast: Secondary | ICD-10-CM

## 2013-04-10 LAB — COMPREHENSIVE METABOLIC PANEL (CC13)
ALT: 14 U/L (ref 0–55)
AST: 16 U/L (ref 5–34)
Alkaline Phosphatase: 94 U/L (ref 40–150)
BUN: 17.9 mg/dL (ref 7.0–26.0)
Creatinine: 0.8 mg/dL (ref 0.6–1.1)
Potassium: 4 mEq/L (ref 3.5–5.1)

## 2013-04-10 LAB — CBC WITH DIFFERENTIAL/PLATELET
BASO%: 0.3 % (ref 0.0–2.0)
Basophils Absolute: 0 10*3/uL (ref 0.0–0.1)
Eosinophils Absolute: 0.1 10*3/uL (ref 0.0–0.5)
HCT: 37.3 % (ref 34.8–46.6)
HGB: 12.2 g/dL (ref 11.6–15.9)
LYMPH%: 27.8 % (ref 14.0–49.7)
MCH: 31.5 pg (ref 25.1–34.0)
MCV: 96.4 fL (ref 79.5–101.0)
MONO%: 6.3 % (ref 0.0–14.0)
lymph#: 1.7 10*3/uL (ref 0.9–3.3)
nRBC: 0 % (ref 0–0)

## 2013-04-10 MED ORDER — SODIUM CHLORIDE 0.9 % IV SOLN
Freq: Once | INTRAVENOUS | Status: AC
  Administered 2013-04-10: 16:00:00 via INTRAVENOUS

## 2013-04-10 MED ORDER — DEXAMETHASONE SODIUM PHOSPHATE 10 MG/ML IJ SOLN
10.0000 mg | Freq: Once | INTRAMUSCULAR | Status: AC
Start: 2013-04-10 — End: 2013-04-10
  Administered 2013-04-10: 10 mg via INTRAVENOUS

## 2013-04-10 MED ORDER — DOXORUBICIN HCL LIPOSOMAL CHEMO INJECTION 2 MG/ML
39.0000 mg/m2 | Freq: Once | INTRAVENOUS | Status: AC
  Administered 2013-04-10: 60 mg via INTRAVENOUS
  Filled 2013-04-10: qty 30

## 2013-04-10 MED ORDER — HEPARIN SOD (PORK) LOCK FLUSH 100 UNIT/ML IV SOLN
500.0000 [IU] | Freq: Once | INTRAVENOUS | Status: AC | PRN
  Administered 2013-04-10: 500 [IU]
  Filled 2013-04-10: qty 5

## 2013-04-10 MED ORDER — SODIUM CHLORIDE 0.9 % IJ SOLN
10.0000 mL | INTRAMUSCULAR | Status: DC | PRN
  Administered 2013-04-10: 10 mL
  Filled 2013-04-10: qty 10

## 2013-04-10 MED ORDER — ONDANSETRON 8 MG/50ML IVPB (CHCC)
8.0000 mg | Freq: Once | INTRAVENOUS | Status: AC
  Administered 2013-04-10: 8 mg via INTRAVENOUS

## 2013-04-10 MED ORDER — ACYCLOVIR 400 MG PO TABS: 400.0000 mg | ORAL_TABLET | Freq: Two times a day (BID) | ORAL | Status: DC

## 2013-04-10 NOTE — Progress Notes (Signed)
[facility]  [phone] [phone]) [phone]  OFFICE PROGRESS NOTE   ID: Cassandra Nichols   DOB: [birth year]  MR#: [id number]  QIO#:[id number]   HISTORY OF PRESENT ILLNESS: Cassandra Nichols was initially diagnosed with ovarian cancer in July of 2002. She underwent debulking surgery at that time for what proved to be a 4 of 18 lymph nodes positive disease. She was initially treated with 6 cycles of paclitaxel and carboplatin adjuvantly, completed in December of 2002.  Patient has had multiple recurrences since December of 2002, and has been treated with multiple agents as noted below.  INTERVAL HISTORY: Cassandra Nichols returns today for follow up of her ovarian cancer. 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 She is doing some yoga exercises.  REVIEW OF SYSTEMS: She states that she has had more energy lately.  She is feeling "restored" since missing her last 2 infusion appointments. Her last Doxil infusion was on 01/25/2013.   She tolerates Doxil well, except for problems with [place] plantar erythrodysesthesia. Around the third week, she experienced peeling and cracking around the fingernail beds in the palms of her hands particularly. She uses bag balm for this.  She states her skin sensitivity has improved.  She still continues to have partial numbness in her feet and her ankles/legs cramp occasionally. She denies any other symptomatology.   PAST MEDICAL HISTORY: Past Medical History  Diagnosis Date  . Ovarian cancer 11/17/2011  . Shingles     PAST SURGICAL HISTORY: Past Surgical History  Procedure Laterality Date  . Ganglion cyst excision Left     Wrist    FAMILY HISTORY Family History  Problem Relation Age of Onset  . Hypertension Mother   . GER disease Mother   . Cancer Father     Cancer (stomach)    GYNECOLOGIC HISTORY:  SOCIAL HISTORY: Cassandra Nichols currently lives  alone. Her daughter Cassandra Nichols lives in [place] and is Interior and spatial designer of the epidemiology and genetics Center there; she is married with 2 children. Son  Cassandra Nichols lives in [place]. He has 4 children of his own.    ADVANCED DIRECTIVES: in place  HEALTH MAINTENANCE: History  Substance Use Topics  . Smoking status: Former Games developer  . Smokeless tobacco: Never Used  . Alcohol Use: No     Comment: Occasionally     Colonoscopy: Not on file  PAP: Not on file  Bone density: Not on file  Lipid panel: Not on file  Allergies  Allergen Reactions  . Penicillins Rash    Current Outpatient Prescriptions  Medication Sig Dispense Refill  . acyclovir (ZOVIRAX) 400 MG tablet Take 1 tablet (400 mg total) by mouth 2 (two) times daily.  60 tablet  2  . Alum & Mag Hydroxide-Simeth (MAGIC MOUTHWASH W/LIDOCAINE) SOLN Take 5 mLs by mouth 4 (four) times daily as needed.  240 mL  2  . ibuprofen (ADVIL,MOTRIN) 800 MG tablet Take 800 mg by mouth every 8 (eight) hours as needed.        . lidocaine-prilocaine (EMLA) cream Apply 1 application topically as needed.        . ondansetron (ZOFRAN) 8 MG tablet 8 mg every 8 (eight) hours as needed for nausea.       . prochlorperazine (COMPAZINE) 10 MG tablet Take 10 mg by mouth every 6 (six) hours as needed.         No current facility-administered medications for this  visit.    OBJECTIVE: Middle-aged white woman in no acute distress Filed Vitals:   04/10/13 1402  BP: 136/76  Pulse: 76  Temp: 98.2 F (36.8 C)  Resp: 20     Body mass index is 25.42 kg/(m^2).    ECOG FS: 1 Filed Weights   04/10/13 1402  Weight: 125 lb 14.4 oz (57.108 kg)   General appearance: Alert, cooperative, well nourished, no apparent distress Head: Normocephalic, without obvious abnormality, atraumatic Eyes: Arcus senilis, PERRLA, EOMI Nose: Nares, septum and mucosa are normal, no drainage or sinus tenderness Neck: No adenopathy, supple, symmetrical, trachea midline, thyroid not enlarged, no  tenderness Resp: Clear to auscultation bilaterally, diminished bibasilar breast sounds Cardio: Regular rate and rhythm, S1, S2 normal, no murmur, click, rub or gallop Breasts: Deferred  GI: Soft, distended, non-tender, normoactive bowel sounds, no organomegaly Skin: Minimal peeling around the nailbeds, slight cracking in the lateral aspect of the right hand. No erythema or ulceration Extremities: Extremities normal, atraumatic, no cyanosis or edema Lymph nodes: Cervical, supraclavicular, and axillary nodes normal Neurologic: Grossly normal   LAB RESULTS: Lab Results  Component Value Date   WBC 6.0 04/10/2013   NEUTROABS 3.9 04/10/2013   HGB 12.2 04/10/2013   HCT 37.3 04/10/2013   MCV 96.4 04/10/2013   PLT 133* 04/10/2013      Chemistry      Component Value Date/Time   NA 143 04/10/2013 1339   NA 140 07/04/2012 0832   K 4.0 04/10/2013 1339   K 4.2 07/04/2012 0832   CL 109* 04/10/2013 1339   CL 106 07/04/2012 0832   CO2 26 04/10/2013 1339   CO2 26 07/04/2012 0832   BUN 17.9 04/10/2013 1339   BUN 17 07/04/2012 0832   CREATININE 0.8 04/10/2013 1339   CREATININE 0.82 07/04/2012 0832      Component Value Date/Time   CALCIUM 8.8 04/10/2013 1339   CALCIUM 9.2 07/04/2012 0832   ALKPHOS 94 04/10/2013 1339   ALKPHOS 71 07/04/2012 0832   AST 16 04/10/2013 1339   AST 18 07/04/2012 0832   ALT 14 04/10/2013 1339   ALT 12 07/04/2012 0832   BILITOT 0.35 04/10/2013 1339   BILITOT 0.7 07/04/2012 0832       Results for Cassandra Nichols (MRN [id number]) as of 04/12/2013 22:16  Ref. Range 10/02/2012 14:10 10/23/2012 09:38 11/06/2012 15:19 11/27/2012 15:31 12/28/2012 13:34 04/10/2013 13:39  CA 125 Latest Range: 0.0-30.2 U/mL 2371.2 (H) 2316.5 (H) 1765.6 (H) 1780.5 (H) 1627.4 (H) 1152.5 (H)     STUDIES: Echocardiogram on 02/07/2013 was stable with ejection fraction of 55-60%.  Mammography February 2013 was benign   ASSESSMENT: 72 year old [place] woman:  (1)  with a history of ovarian cancer  dating back to July 2002 when she underwent debulking surgery for what proved to be 4/18 lymph node positive disease.   (2)  She received 6 cycles of paclitaxel/carboplatin adjuvantly completed in December 2002.   (3)  She had her first recurrence in December 2005 treated with paclitaxel;   (4)  second recurrence May 2007 treated with [place] and paclitaxel;   (5)  third recurrence January 2010 treated with topotecan and gemzar;   (6)  fourth recurrence January 2012 treated with single-agent carboplatin (January to August 2012) with initial response, then plateau; with progression January 2013 (less than 6 months after last carbo dose) treated with single-agent gemzar given Q2 weeks (January to April 2013), with evidence of progression  (7) Doxil started July 2013, with evidence  of response, and fair tolerance   PLAN:  Cassandra Nichols will continue with Doxil infusions every 4 weeks.  It will be recalled that Doxil infusions have been changed from every 3 weeks to every 4 weeks to help with the palm or planter erythrodysesthesia symptoms.  Her treatment will be continued for the next few months as tolerated.  Her next Doxil infusions are scheduled for 05/08/2013 and 06/06/2013. The patient is due for a bilateral digital diagnostic mammogram, we will schedule this for her.  Cassandra Nichols is encouraged to contact us in the interim with any questions or concerns.   Cassandra Bras NP-C 04/12/2013  10:22 PM

## 2013-04-10 NOTE — Patient Instructions (Addendum)
[facility] Discharge Instructions for Patients Receiving Chemotherapy  Today you received the following chemotherapy agents :  Doxil.  To help prevent nausea and vomiting after your treatment, we encourage you to take your nausea medication as instructed by your physician.    If you develop nausea and vomiting that is not controlled by your nausea medication, call the clinic. If it is after clinic hours your family physician or the after hours number for the clinic or go to the Emergency Department.   BELOW ARE SYMPTOMS THAT SHOULD BE REPORTED IMMEDIATELY:  *FEVER GREATER THAN 100.5 F  *CHILLS WITH OR WITHOUT FEVER  NAUSEA AND VOMITING THAT IS NOT CONTROLLED WITH YOUR NAUSEA MEDICATION  *UNUSUAL SHORTNESS OF BREATH  *UNUSUAL BRUISING OR BLEEDING  TENDERNESS IN MOUTH AND THROAT WITH OR WITHOUT PRESENCE OF ULCERS  *URINARY PROBLEMS  *BOWEL PROBLEMS  UNUSUAL RASH Items with * indicate a potential emergency and should be followed up as soon as possible.  One of the nurses will contact you 24 hours after your treatment. Please let the nurse know about any problems that you may have experienced. Feel free to call the clinic you have any questions or concerns. The clinic phone number is [phone].   I have been informed and understand all the instructions given to me. I know to contact the clinic, my physician, or go to the Emergency Department if any problems should occur. I do not have any questions at this time, but understand that I may call the clinic during office hours   should I have any questions or need assistance in obtaining follow up care.    __________________________________________  _____________  __________ Signature of Patient or Authorized Representative            Date                   Time    __________________________________________ Nurse's Signature    

## 2013-04-10 NOTE — Telephone Encounter (Signed)
Per staff phone call and POF I have schedueld appts.  JMW  

## 2013-04-10 NOTE — Patient Instructions (Addendum)
Please contact us at [phone] if you have any questions or concerns. 

## 2013-04-11 LAB — CA 125: CA 125: 1152.5 U/mL — ABNORMAL HIGH (ref 0.0–30.2)

## 2013-04-13 ENCOUNTER — Telehealth: Payer: Self-pay | Admitting: *Deleted

## 2013-04-13 NOTE — Telephone Encounter (Signed)
Lm gv appt d/t for her mammogram. appt scheduled for 04/19/13@ 10:10.Also made pt after that i will mail a cal as well...td

## 2013-04-18 ENCOUNTER — Telehealth: Payer: Self-pay | Admitting: Oncology

## 2013-04-19 ENCOUNTER — Ambulatory Visit: Payer: Medicare Other

## 2013-04-19 ENCOUNTER — Other Ambulatory Visit: Payer: Medicare Other | Admitting: Lab

## 2013-05-08 ENCOUNTER — Ambulatory Visit: Payer: Medicare Other | Admitting: Physician Assistant

## 2013-05-08 ENCOUNTER — Other Ambulatory Visit: Payer: Medicare Other | Admitting: Lab

## 2013-05-08 ENCOUNTER — Ambulatory Visit: Payer: Medicare Other

## 2013-05-09 ENCOUNTER — Other Ambulatory Visit (HOSPITAL_BASED_OUTPATIENT_CLINIC_OR_DEPARTMENT_OTHER): Payer: Medicare Other | Admitting: Lab

## 2013-05-09 ENCOUNTER — Telehealth: Payer: Self-pay | Admitting: Oncology

## 2013-05-09 ENCOUNTER — Encounter: Payer: Self-pay | Admitting: Physician Assistant

## 2013-05-09 ENCOUNTER — Ambulatory Visit (HOSPITAL_BASED_OUTPATIENT_CLINIC_OR_DEPARTMENT_OTHER): Payer: Medicare Other | Admitting: Physician Assistant

## 2013-05-09 ENCOUNTER — Ambulatory Visit (HOSPITAL_BASED_OUTPATIENT_CLINIC_OR_DEPARTMENT_OTHER): Payer: Medicare Other

## 2013-05-09 VITALS — BP 155/76 | HR 78 | Temp 98.1°F | Resp 18 | Ht 59.0 in | Wt 124.8 lb

## 2013-05-09 DIAGNOSIS — Z5111 Encounter for antineoplastic chemotherapy: Secondary | ICD-10-CM

## 2013-05-09 DIAGNOSIS — C569 Malignant neoplasm of unspecified ovary: Secondary | ICD-10-CM

## 2013-05-09 DIAGNOSIS — M7989 Other specified soft tissue disorders: Secondary | ICD-10-CM

## 2013-05-09 DIAGNOSIS — C779 Secondary and unspecified malignant neoplasm of lymph node, unspecified: Secondary | ICD-10-CM

## 2013-05-09 LAB — COMPREHENSIVE METABOLIC PANEL (CC13)
AST: 16 U/L (ref 5–34)
Albumin: 3.5 g/dL (ref 3.5–5.0)
Alkaline Phosphatase: 77 U/L (ref 40–150)
BUN: 17.7 mg/dL (ref 7.0–26.0)
Creatinine: 0.7 mg/dL (ref 0.6–1.1)
Glucose: 78 mg/dl (ref 70–99)
Potassium: 3.8 mEq/L (ref 3.5–5.1)
Total Bilirubin: 0.45 mg/dL (ref 0.20–1.20)

## 2013-05-09 LAB — CBC WITH DIFFERENTIAL/PLATELET
Basophils Absolute: 0 10*3/uL (ref 0.0–0.1)
EOS%: 0 % (ref 0.0–7.0)
HGB: 12.4 g/dL (ref 11.6–15.9)
LYMPH%: 41.2 % (ref 14.0–49.7)
MCH: 32.3 pg (ref 25.1–34.0)
MCV: 95.1 fL (ref 79.5–101.0)
MONO%: 11.6 % (ref 0.0–14.0)
Platelets: 145 10*3/uL (ref 145–400)
RDW: 14.9 % — ABNORMAL HIGH (ref 11.2–14.5)

## 2013-05-09 MED ORDER — SODIUM CHLORIDE 0.9 % IV SOLN
Freq: Once | INTRAVENOUS | Status: AC
  Administered 2013-05-09: 15:00:00 via INTRAVENOUS

## 2013-05-09 MED ORDER — DOXORUBICIN HCL LIPOSOMAL CHEMO INJECTION 2 MG/ML
60.0000 mg | Freq: Once | INTRAVENOUS | Status: AC
  Administered 2013-05-09: 60 mg via INTRAVENOUS
  Filled 2013-05-09: qty 30

## 2013-05-09 MED ORDER — HEPARIN SOD (PORK) LOCK FLUSH 100 UNIT/ML IV SOLN
500.0000 [IU] | Freq: Once | INTRAVENOUS | Status: AC | PRN
  Administered 2013-05-09: 500 [IU]
  Filled 2013-05-09: qty 5

## 2013-05-09 MED ORDER — ONDANSETRON 8 MG/50ML IVPB (CHCC)
8.0000 mg | Freq: Once | INTRAVENOUS | Status: AC
  Administered 2013-05-09: 8 mg via INTRAVENOUS

## 2013-05-09 MED ORDER — SODIUM CHLORIDE 0.9 % IJ SOLN
10.0000 mL | INTRAMUSCULAR | Status: DC | PRN
  Administered 2013-05-09: 10 mL
  Filled 2013-05-09: qty 10

## 2013-05-09 MED ORDER — DEXAMETHASONE SODIUM PHOSPHATE 10 MG/ML IJ SOLN
10.0000 mg | Freq: Once | INTRAMUSCULAR | Status: AC
  Administered 2013-05-09: 10 mg via INTRAVENOUS

## 2013-05-09 NOTE — Patient Instructions (Addendum)
[facility] Discharge Instructions for Patients Receiving Chemotherapy  Today you received the following chemotherapy agents Doxil.  To help prevent nausea and vomiting after your treatment, we encourage you to take your nausea medication as prescribed.    If you develop nausea and vomiting that is not controlled by your nausea medication, call the clinic. If it is after clinic hours your family physician or the after hours number for the clinic or go to the Emergency Department.   BELOW ARE SYMPTOMS THAT SHOULD BE REPORTED IMMEDIATELY:  *FEVER GREATER THAN 100.5 F  *CHILLS WITH OR WITHOUT FEVER  NAUSEA AND VOMITING THAT IS NOT CONTROLLED WITH YOUR NAUSEA MEDICATION  *UNUSUAL SHORTNESS OF BREATH  *UNUSUAL BRUISING OR BLEEDING  TENDERNESS IN MOUTH AND THROAT WITH OR WITHOUT PRESENCE OF ULCERS  *URINARY PROBLEMS  *BOWEL PROBLEMS  UNUSUAL RASH Items with * indicate a potential emergency and should be followed up as soon as possible.  Please let the nurse know about any problems that you may have experienced. Feel free to call the clinic you have any questions or concerns. The clinic phone number is [phone].   I have been informed and understand all the instructions given to me. I know to contact the clinic, my physician, or go to the Emergency Department if any problems should occur. I do not have any questions at this time, but understand that I may call the clinic during office hours   should I have any questions or need assistance in obtaining follow up care.    __________________________________________  _____________  __________ Signature of Patient or Authorized Representative            Date                   Time    __________________________________________ Nurse's Signature

## 2013-05-09 NOTE — Progress Notes (Signed)
ID: [name]   DOB: 1941-11-29  MR#: [id number]  WUJ#:[id number]  HISTORY OF PRESENT ILLNESS: Cassandra Nichols was initially diagnosed with ovarian cancer in July of 2002. She underwent debulking surgery at that time for what proved to be a 4 of 18 lymph nodes positive disease. She was initially treated with 6 cycles of paclitaxel and carboplatin adjuvantly, completed in December of 2002.  Patient has had multiple recurrences since December of 2002, and has been treated with multiple agents as noted below.  INTERVAL HISTORY: Cassandra Nichols returns today for followup of her ovarian cancer. She continues to receive Doxil every 4 weeks, and is due for her next dose today.  Interval history is generally unremarkable. 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 She is feeling well. In fact she has no new complaints today.   REVIEW OF SYSTEMS: Cassandra Nichols denies any fevers, chills, night sweats, or hot flashes. Her skin is dry, but she's had no cracking or peeling, and denies any rashes or skin changes otherwise. She's had no abnormal bleeding. She's eating and drinking well with no nausea and no change in bowel or bladder habits. She has mild urinary stress incontinence. She denies any cough, shortness of breath, orthopnea, chest pain, palpitations. She's had no abnormal headaches, dizziness, or change in vision, and also denies any unusual myalgias, arthralgias, or bony pain. She occasionally has some swelling in her feet and ankles when she sits for long periods of time, but this resolves with elevation.  A detailed review of systems is otherwise stable and noncontributory.    PAST MEDICAL HISTORY: Past Medical History  Diagnosis Date  . Ovarian cancer 11/17/2011  . Shingles     PAST SURGICAL HISTORY: Past Surgical History  Procedure Laterality Date  . Ganglion cyst excision Left     Wrist    FAMILY HISTORY Family History  Problem Relation Age of Onset  . Hypertension Mother   . GER disease Mother   .  Cancer Father     Cancer (stomach)    GYNECOLOGIC HISTORY:  SOCIAL HISTORY: Cassandra Nichols currently lives alone. Her daughter Cassandra Nichols lives in [place] and is Interior and spatial designer of the epidemiology and genetics Center there; she is married with 2 children. Son  Cassandra Nichols lives in [place]. He has 4 children of his own.   ADVANCED DIRECTIVES: in place  HEALTH MAINTENANCE: History  Substance Use Topics  . Smoking status: Former Games developer  . Smokeless tobacco: Never Used  . Alcohol Use: No     Comment: Occasionally     Colonoscopy:  PAP:  Bone density:  Lipid panel:  Allergies  Allergen Reactions  . Penicillins Rash    Current Outpatient Prescriptions  Medication Sig Dispense Refill  . acyclovir (ZOVIRAX) 400 MG tablet Take 1 tablet (400 mg total) by mouth 2 (two) times daily.  60 tablet  2  . Alum & Mag Hydroxide-Simeth (MAGIC MOUTHWASH W/LIDOCAINE) SOLN Take 5 mLs by mouth 4 (four) times daily as needed.  240 mL  2  . ibuprofen (ADVIL,MOTRIN) 800 MG tablet Take 800 mg by mouth every 8 (eight) hours as needed.        . ondansetron (ZOFRAN) 8 MG tablet 8 mg every 8 (eight) hours as needed for nausea.       . prochlorperazine (COMPAZINE) 10 MG tablet Take 10 mg by mouth every 6 (six) hours as needed.         No current facility-administered medications for this visit.    OBJECTIVE: Cassandra Nichols in no acute distress Filed Vitals:   05/09/13 1338  BP: 155/76  Pulse: 78  Temp: 98.1 F (36.7 C)  Resp: 18     Body mass index is 25.19 kg/(m^2).    ECOG FS: 1 Filed Weights   05/09/13 1338  Weight: 124 lb 12.8 oz (56.609 kg)   Sclerae unicteric Oropharynx clear, no current ulcerations No peripheral adenopathy Lungs clear to auscultation bilaterally, no rales or rhonchi Heart regular rate and rhythm, no murmurs Abd soft, nondistended, nontender, positive bowel sounds, no masses palpated  MSK no focal spinal tenderness No peripheral edema Neuro: nonfocal., Well oriented, positive  affect Breast exam deferred Skin: No evidence of PPE, although the skin on the hands is slightly dry. There is no cracking or peeling noted.   LAB RESULTS: Lab Results  Component Value Date   WBC 4.2 05/09/2013   NEUTROABS 2.0 05/09/2013   HGB 12.4 05/09/2013   HCT 36.5 05/09/2013   MCV 95.1 05/09/2013   PLT 145 05/09/2013      Chemistry      Component Value Date/Time   NA 143 4/Cassandra/2014 1339   NA 140 07/04/2012 0832   K 4.0 4/Cassandra/2014 1339   K 4.2 07/04/2012 0832   CL 109* 4/Cassandra/2014 1339   CL 106 07/04/2012 0832   CO2 26 4/Cassandra/2014 1339   CO2 26 07/04/2012 0832   BUN 17.9 4/Cassandra/2014 1339   BUN 17 07/04/2012 0832   CREATININE 0.8 4/Cassandra/2014 1339   CREATININE 0.82 07/04/2012 0832      Component Value Date/Time   CALCIUM 8.8 4/Cassandra/2014 1339   CALCIUM 9.2 07/04/2012 0832   ALKPHOS 94 4/Cassandra/2014 1339   ALKPHOS 71 07/04/2012 0832   AST 16 4/Cassandra/2014 1339   AST 18 07/04/2012 0832   ALT 14 4/Cassandra/2014 1339   ALT 12 07/04/2012 0832   BILITOT 0.35 4/Cassandra/2014 1339   BILITOT 0.7 07/04/2012 0832     Results for Cassandra Nichols, Cassandra Nichols (MRN [id number]) as of 12/31/2012 10:01  Ref. Range 10/02/2012 14:10 10/23/2012 09:38 11/06/2012 15:19 11/27/2012 15:31 12/28/2012 13:34  CA 125 Latest Range: 0.0-30.2 U/mL 2371.2 (H) 2316.5 (H) 1765.6 (H) 1780.5 (H) 1627.4 (H)    STUDIES: Echocardiogram on 02/07/2013 was stable with ejection fraction of 50 - 55 %.  Mammography February 2013 was benign.     ASSESSMENT: 72 year old [name]   (1)  with a history of ovarian cancer dating back to July 2002 when she underwent debulking surgery for what proved to be 4/18 lymph node positive disease.   (2)  She received 6 cycles of paclitaxel/carboplatin adjuvantly completed in December 2002.   (3)  She had her first recurrence in December 2005 treated with paclitaxel;   (4)  second recurrence May 2007 treated with [place] and paclitaxel;   (5)  third recurrence January 2010 treated with topotecan and Gemzar;   (6)   fourth recurrence January 2012 treated with single-agent carboplatin (January to August 2012) with initial response, then plateau; with progression January 2013 (less than 6 months after last carbo dose) treated with single-agent gemzar given Q2 weeks (January to April 2013), with evidence of progression  (7) Doxil strartedJuly 2013, with evidence of response, and fair tolerance.  Being treated every 4 weeks.  PLAN:  Cassandra Nichols will proceed to treatment today as scheduled for her next q. 4 week dose of Doxil. She return on June 17 for followup with Dr. Darnelle Catalan, and is scheduled for her next treatment on June 18. With regards  to her mammogram, I offered to reschedule this for her, but she tells me she will call and scheduled appointment on her own. I did encourage her to do so soon since she is slightly past due with her mammogram at this point.  Patient voices understanding and agreement with her plan, and as always knows to call with any changes or problems.    Cassandra Nichols    05/09/2013

## 2013-05-11 ENCOUNTER — Telehealth: Payer: Self-pay | Admitting: *Deleted

## 2013-05-11 NOTE — Telephone Encounter (Signed)
Lm correcting myself by informing the pt that she will not have a tx on 06/12/13..however she will have labs and ov.Marland Kitchentd

## 2013-05-11 NOTE — Telephone Encounter (Signed)
Lm infroming the pt of her appts. gv appt d/t for 06/12/13 @ 1:15 for labs, ov, and tx to follow. i also emailed MW to add tx for 06/12/13. i also made pt aware that i would mail letter/cal as well...td

## 2013-05-16 ENCOUNTER — Ambulatory Visit (HOSPITAL_COMMUNITY): Payer: Medicare Other

## 2013-05-29 ENCOUNTER — Other Ambulatory Visit: Payer: Self-pay | Admitting: *Deleted

## 2013-05-29 ENCOUNTER — Telehealth: Payer: Self-pay | Admitting: *Deleted

## 2013-05-29 NOTE — Progress Notes (Signed)
Received call from patient that she needs to cancel her appts for 6/17,6/18, 6/24.  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  POF sent to cancel her treatment.

## 2013-05-29 NOTE — Telephone Encounter (Signed)
Pt called to cancel all her appts, and she will call back at a later time to r/s....td

## 2013-06-05 ENCOUNTER — Other Ambulatory Visit: Payer: Medicare Other | Admitting: Lab

## 2013-06-05 ENCOUNTER — Ambulatory Visit: Payer: Medicare Other | Admitting: Oncology

## 2013-06-06 ENCOUNTER — Ambulatory Visit: Payer: Medicare Other

## 2013-06-12 ENCOUNTER — Ambulatory Visit: Payer: Medicare Other | Admitting: Physician Assistant

## 2013-06-12 ENCOUNTER — Other Ambulatory Visit: Payer: Medicare Other | Admitting: Lab

## 2013-08-05 ENCOUNTER — Other Ambulatory Visit: Payer: Self-pay | Admitting: Physician Assistant

## 2013-08-07 ENCOUNTER — Other Ambulatory Visit: Payer: Self-pay | Admitting: Emergency Medicine

## 2013-08-07 ENCOUNTER — Other Ambulatory Visit: Payer: Self-pay | Admitting: Physician Assistant

## 2013-08-07 DIAGNOSIS — C569 Malignant neoplasm of unspecified ovary: Secondary | ICD-10-CM

## 2013-08-08 ENCOUNTER — Other Ambulatory Visit: Payer: Self-pay | Admitting: Emergency Medicine

## 2013-08-10 ENCOUNTER — Telehealth: Payer: Self-pay | Admitting: Oncology

## 2013-08-10 NOTE — Telephone Encounter (Signed)
, °

## 2013-08-13 ENCOUNTER — Ambulatory Visit (HOSPITAL_COMMUNITY)
Admission: RE | Admit: 2013-08-13 | Discharge: 2013-08-13 | Disposition: A | Discharge status: Home / Self Care | Payer: Medicare Other | Source: Ambulatory Visit | Attending: Oncology | Admitting: Oncology

## 2013-08-13 ENCOUNTER — Other Ambulatory Visit (HOSPITAL_BASED_OUTPATIENT_CLINIC_OR_DEPARTMENT_OTHER): Payer: Medicare Other

## 2013-08-13 DIAGNOSIS — C569 Malignant neoplasm of unspecified ovary: Secondary | ICD-10-CM

## 2013-08-13 DIAGNOSIS — Z09 Encounter for follow-up examination after completed treatment for conditions other than malignant neoplasm: Secondary | ICD-10-CM | POA: Insufficient documentation

## 2013-08-13 DIAGNOSIS — C549 Malignant neoplasm of corpus uteri, unspecified: Secondary | ICD-10-CM | POA: Insufficient documentation

## 2013-08-13 DIAGNOSIS — Z87891 Personal history of nicotine dependence: Secondary | ICD-10-CM | POA: Insufficient documentation

## 2013-08-13 LAB — COMPREHENSIVE METABOLIC PANEL (CC13)
AST: 17 U/L (ref 5–34)
Alkaline Phosphatase: 91 U/L (ref 40–150)
BUN: 14.6 mg/dL (ref 7.0–26.0)
Calcium: 9.2 mg/dL (ref 8.4–10.4)
Chloride: 105 mEq/L (ref 98–109)
Creatinine: 0.8 mg/dL (ref 0.6–1.1)

## 2013-08-13 LAB — CBC WITH DIFFERENTIAL/PLATELET
BASO%: 0.7 % (ref 0.0–2.0)
EOS%: 0.5 % (ref 0.0–7.0)
HGB: 13 g/dL (ref 11.6–15.9)
MCH: 32.6 pg (ref 25.1–34.0)
MCHC: 33.7 g/dL (ref 31.5–36.0)
MONO#: 0.3 10*3/uL (ref 0.1–0.9)
RDW: 14.3 % (ref 11.2–14.5)
WBC: 4.7 10*3/uL (ref 3.9–10.3)
lymph#: 1.4 10*3/uL (ref 0.9–3.3)

## 2013-08-13 LAB — CA 125: CA 125: 1869.2 U/mL — ABNORMAL HIGH (ref 0.0–30.2)

## 2013-08-13 NOTE — Progress Notes (Signed)
  Echocardiogram 2D Echocardiogram has been performed.  Cassandra Nichols 08/13/2013, 11:12 AM 

## 2013-08-16 ENCOUNTER — Telehealth: Payer: Self-pay | Admitting: *Deleted

## 2013-08-16 ENCOUNTER — Ambulatory Visit (HOSPITAL_BASED_OUTPATIENT_CLINIC_OR_DEPARTMENT_OTHER): Payer: Medicare Other | Admitting: Physician Assistant

## 2013-08-16 ENCOUNTER — Ambulatory Visit (HOSPITAL_COMMUNITY)
Admission: RE | Admit: 2013-08-16 | Discharge: 2013-08-16 | Disposition: A | Discharge status: Home / Self Care | Payer: Medicare Other | Source: Ambulatory Visit | Attending: Physician Assistant | Admitting: Physician Assistant

## 2013-08-16 ENCOUNTER — Encounter: Payer: Self-pay | Admitting: Physician Assistant

## 2013-08-16 VITALS — BP 137/74 | HR 84 | Temp 98.5°F | Resp 20 | Ht 59.0 in | Wt 124.8 lb

## 2013-08-16 DIAGNOSIS — C569 Malignant neoplasm of unspecified ovary: Secondary | ICD-10-CM

## 2013-08-16 DIAGNOSIS — M549 Dorsalgia, unspecified: Secondary | ICD-10-CM

## 2013-08-16 NOTE — Progress Notes (Signed)
ID: Cassandra Nichols   DOB: 1940-12-26  MR#: [id number]  WUJ#:[id number]  HISTORY OF PRESENT ILLNESS: Cassandra Nichols was initially diagnosed with ovarian cancer in July of 2002. She underwent debulking surgery at that time for what proved to be a 4 of 18 lymph nodes positive disease. She was initially treated with 6 cycles of paclitaxel and carboplatin adjuvantly, completed in December of 2002.  Patient has had multiple recurrences since December of 2002, and has been treated with multiple agents as noted below.  INTERVAL HISTORY: Cassandra Nichols returns today for followup of her ovarian cancer. She was previously been treated with Doxil every 4 weeks, her last dose given on 05/09/2013. 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 She is now back and ready to restart therapy.  Interval history is remarkable for Cassandra Nichols having recently fractured a front tooth, and now has an infection in the gum. It has been evaluated by a dentist and she anticipates needing an extraction in the next few weeks. This is being followed closely through her dental office.  Blakleigh Nichols has some dizzy weeks coming up. She is trying to get the dental procedure scheduled. She wants to restart chemotherapy as soon as possible. She's also scheduling a trip to [place] to visit her aunt and her daughter, and hopes to be gone at least 2 weeks in September. 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  REVIEW OF SYSTEMS: Cassandra Nichols denies any recent illnesses, and has had no fevers, chills, night sweats, or hot flashes. She denies any rashes, skin changes, abnormal bruising and has had no abnormal bleeding. She's eating and drinking well with no nausea and no change in bowel or bladder habits. She has mild urinary stress incontinence which is stable. She denies any cough, shortness of breath, orthopnea, chest pain, or palpitations.  She's had no abnormal headaches, dizziness, or change in vision, and also denies any unusual myalgias, arthralgias, or bony pain. She has no increased peripheral neuropathy or peripheral swelling.  A detailed review of systems is otherwise stable and noncontributory.    PAST MEDICAL HISTORY: Past Medical History  Diagnosis Date  . Ovarian cancer 11/17/2011  . Shingles     PAST SURGICAL HISTORY: Past Surgical History  Procedure Laterality Date  . Ganglion cyst excision Left     Wrist    FAMILY HISTORY Family History  Problem Relation Age of Onset  . Hypertension Mother   . GER disease Mother   . Cancer Father     Cancer (stomach)    GYNECOLOGIC HISTORY:  SOCIAL HISTORY: Cassandra Nichols currently lives alone. Her daughter Cassandra Nichols lives in [place] and is Interior and spatial designer of the epidemiology and genetics Center there; she is married with 2 children. Son  Cassandra Nichols lives in [place]. He has 4 children of his own.   ADVANCED DIRECTIVES: in place  HEALTH MAINTENANCE: History  Substance Use Topics  . Smoking status: Former Games developer  . Smokeless tobacco: Never Used  . Alcohol Use: No     Comment: Occasionally     Colonoscopy:  PAP:  Bone density:  Lipid panel:  Allergies  Allergen Reactions  . Penicillins Rash    Current Outpatient Prescriptions  Medication Sig Dispense Refill  . acyclovir (ZOVIRAX) 400 MG tablet TAKE 1 TABLET TWICE A DAY  60 tablet  2  . Alum & Mag Hydroxide-Simeth (MAGIC MOUTHWASH W/LIDOCAINE)  SOLN Take 5 mLs by mouth 4 (four) times daily as needed.  240 mL  2  . ibuprofen (ADVIL,MOTRIN) 800 MG tablet Take 800 mg by mouth every 8 (eight) hours as needed.        . ondansetron (ZOFRAN) 8 MG tablet 8 mg every 8 (eight) hours as needed for nausea.       . prochlorperazine (COMPAZINE) 10 MG tablet Take 10 mg by mouth every 6 (six) hours as needed.         No current facility-administered medications for this visit.    OBJECTIVE: Middle-aged white woman in no acute  distress Filed Vitals:   08/16/13 1334  BP: 137/74  Pulse: 84  Temp: 98.5 F (36.9 C)  Resp: 20     Body mass index is 25.19 kg/(m^2).    ECOG FS: 1 Filed Weights   08/16/13 1334  Weight: 124 lb 12.8 oz (56.609 kg)   Sclerae unicteric Oropharynx clear No peripheral adenopathy Lungs clear to auscultation bilaterally, no rales or rhonchi Heart regular rate and rhythm, no murmurs Abd soft, nondistended, nontender, positive bowel sounds, no masses palpated  MSK no focal spinal tenderness to vigorous palpation. No tenderness to palpation along the right and left lower back, or along the hips. No peripheral edema Neuro: nonfocal., Well oriented, positive affect Breast exam deferred    LAB RESULTS: Lab Results  Component Value Date   WBC 4.7 08/13/2013   NEUTROABS 3.0 08/13/2013   HGB 13.0 08/13/2013   HCT 38.5 08/13/2013   MCV 96.6 08/13/2013   PLT 141* 08/13/2013      Chemistry      Component Value Date/Time   NA 140 08/13/2013 0836   NA 140 07/04/2012 0832   K 3.8 08/13/2013 0836   K 4.2 07/04/2012 0832   CL 106 05/09/2013 1304   CL 106 07/04/2012 0832   CO2 25 08/13/2013 0836   CO2 26 07/04/2012 0832   BUN 14.6 08/13/2013 0836   BUN 17 07/04/2012 0832   CREATININE 0.8 08/13/2013 0836   CREATININE 0.82 07/04/2012 0832      Component Value Date/Time   CALCIUM 9.2 08/13/2013 0836   CALCIUM 9.2 07/04/2012 0832   ALKPHOS 91 08/13/2013 0836   ALKPHOS 71 07/04/2012 0832   AST 17 08/13/2013 0836   AST 18 07/04/2012 0832   ALT 14 08/13/2013 0836   ALT 12 07/04/2012 0832   BILITOT 0.55 08/13/2013 0836   BILITOT 0.7 07/04/2012 0832     Results for SELETHA, ZIMMERMANN (MRN [id number]) as of 12/31/2012 10:01  Ref. Range 10/02/2012 14:10 10/23/2012 09:38 11/06/2012 15:19 11/27/2012 15:31 12/28/2012 13:34  CA 125 Latest Range: 0.0-30.2 U/mL 2371.2 (H) 2316.5 (H) 1765.6 (H) 1780.5 (H) 1627.4 (H)    STUDIES: Echocardiogram on 08/13/2013 confirmed a stable ejection fraction of 60-65%.   Mammography  February 2013 was benign.     ASSESSMENT: 72 year old [place] woman   (1)  with a history of ovarian cancer dating back to July 2002 when she underwent debulking surgery for what proved to be 4/18 lymph node positive disease.   (2)  She received 6 cycles of paclitaxel/carboplatin adjuvantly completed in December 2002.   (3)  She had her first recurrence in December 2005 treated with paclitaxel;   (4)  second recurrence May 2007 treated with [place] and paclitaxel;   (5)  third recurrence January 2010 treated with topotecan and Gemzar;   (6)  fourth recurrence January 2012 treated with single-agent carboplatin (January to  August 2012) with initial response, then plateau; with progression January 2013 (less than 6 months after last carbo dose) treated with single-agent gemzar given Q2 weeks (January to April 2013), with evidence of progression  (7) Doxil strartedJuly 2013, with evidence of response, and fair tolerance.  Being treated every 4 weeks. Held between May and August 2014 due to patient's work schedule and scheduling issues.  Ready to resume treatment as of 08/24/2013.  PLAN:  I have reviewed this case with Dr. Darnelle Catalan, and we both feel it would be most prudent to restage Ardis Rowan with a chest x-ray and CTs of the abdomen and pelvis prior to reinitiating her treatment with Doxil. We will go ahead and get their studies as soon as possible. We will plan on initiating her next q. 28 day dose of Doxil next Friday, September 5. From that point forward, however, she would like to be scheduled on Wednesdays.  Accordingly, we'll plan on seeing her again on October 8 for repeat labs, physical exam, and her next dose of Doxil.   Telissa Palmisano will work on scheduling her dental evaluation and extraction of the left upper tooth and will let us know when that is scheduled. Of course as noted above, she is also trying to schedule a trip to [place] during September to visit her aunt and her daughter. She  will call us if any of her appointments you need to be rescheduled accordingly, but we would like to get back to a normal treatment schedule as soon as possible.  She's also past due for her screening mammogram, and I reminded her of this today. She tells me she will "deal with that later" after these other situations have been taking care of. She declines for [place] to make her a mammogram appointment today.  Patient voices understanding and agreement with her plan, and as always knows to call with any changes or problems.    Saburo Luger    08/16/2013

## 2013-08-16 NOTE — Telephone Encounter (Signed)
appts made and printed. Pt is aware tx will be added. i emailed MW to add the tx's. Pt is aware that cs will call her w/ an appt for her ct scans...td

## 2013-08-16 NOTE — Telephone Encounter (Signed)
Per staff message and POF I have scheduled appts.  JMW  

## 2013-08-17 ENCOUNTER — Other Ambulatory Visit: Payer: Self-pay | Admitting: *Deleted

## 2013-08-19 ENCOUNTER — Encounter: Payer: Self-pay | Admitting: Oncology

## 2013-08-23 ENCOUNTER — Encounter (HOSPITAL_COMMUNITY): Payer: Self-pay

## 2013-08-23 ENCOUNTER — Ambulatory Visit (HOSPITAL_COMMUNITY)
Admission: RE | Admit: 2013-08-23 | Discharge: 2013-08-23 | Disposition: A | Discharge status: Home / Self Care | Payer: Medicare Other | Source: Ambulatory Visit | Attending: Physician Assistant | Admitting: Physician Assistant

## 2013-08-23 ENCOUNTER — Telehealth: Payer: Self-pay | Admitting: *Deleted

## 2013-08-23 DIAGNOSIS — C569 Malignant neoplasm of unspecified ovary: Secondary | ICD-10-CM | POA: Insufficient documentation

## 2013-08-23 MED ORDER — IOHEXOL 300 MG/ML  SOLN
100.0000 mL | Freq: Once | INTRAMUSCULAR | Status: AC | PRN
  Administered 2013-08-23: 100 mL via INTRAVENOUS

## 2013-08-23 NOTE — Telephone Encounter (Signed)
Bjorn Loser called as ordered by Dr. Llana Aliment to bring impression number 2 of today's CT abdomin pelvis to provider's attention.  Doxil treatment scheduled on tomorrow with next f/u 09-26-2013 .   2. "In addition, there is a new compression fracture of L3 with  approximately 25% loss of vertebral body height and mild  retropulsion of fracture fragments with slightly narrows the  central spinal canal. Whether or not this is a benign compression  fracture or a pathologic fracture is uncertain on today's  examination. Correlation with MRI of the lumbar spine with and  without IV gadolinium may be warranted if clinically indicated." Will notify providers.

## 2013-08-24 ENCOUNTER — Other Ambulatory Visit: Payer: Self-pay | Admitting: Physician Assistant

## 2013-08-24 ENCOUNTER — Other Ambulatory Visit (HOSPITAL_BASED_OUTPATIENT_CLINIC_OR_DEPARTMENT_OTHER): Payer: Medicare Other | Admitting: Lab

## 2013-08-24 ENCOUNTER — Ambulatory Visit (HOSPITAL_BASED_OUTPATIENT_CLINIC_OR_DEPARTMENT_OTHER): Payer: Medicare Other

## 2013-08-24 VITALS — BP 165/88 | HR 81 | Temp 97.3°F

## 2013-08-24 DIAGNOSIS — Z5111 Encounter for antineoplastic chemotherapy: Secondary | ICD-10-CM

## 2013-08-24 DIAGNOSIS — C569 Malignant neoplasm of unspecified ovary: Secondary | ICD-10-CM

## 2013-08-24 LAB — CBC WITH DIFFERENTIAL/PLATELET
BASO%: 0.4 % (ref 0.0–2.0)
EOS%: 0.9 % (ref 0.0–7.0)
MCH: 32.3 pg (ref 25.1–34.0)
MCHC: 33.8 g/dL (ref 31.5–36.0)
MCV: 95.5 fL (ref 79.5–101.0)
MONO%: 9.2 % (ref 0.0–14.0)
NEUT#: 2.7 10*3/uL (ref 1.5–6.5)
RBC: 4 10*6/uL (ref 3.70–5.45)
RDW: 14.2 % (ref 11.2–14.5)
nRBC: 0 % (ref 0–0)

## 2013-08-24 LAB — COMPREHENSIVE METABOLIC PANEL (CC13)
ALT: 15 U/L (ref 0–55)
Alkaline Phosphatase: 89 U/L (ref 40–150)
Creatinine: 0.7 mg/dL (ref 0.6–1.1)
Sodium: 140 mEq/L (ref 136–145)
Total Bilirubin: 0.56 mg/dL (ref 0.20–1.20)
Total Protein: 6.6 g/dL (ref 6.4–8.3)

## 2013-08-24 MED ORDER — SODIUM CHLORIDE 0.9 % IV SOLN
Freq: Once | INTRAVENOUS | Status: AC
  Administered 2013-08-24: 10:00:00 via INTRAVENOUS

## 2013-08-24 MED ORDER — SODIUM CHLORIDE 0.9 % IJ SOLN
10.0000 mL | INTRAMUSCULAR | Status: DC | PRN
  Administered 2013-08-24: 10 mL
  Filled 2013-08-24: qty 10

## 2013-08-24 MED ORDER — DEXAMETHASONE SODIUM PHOSPHATE 10 MG/ML IJ SOLN
10.0000 mg | Freq: Once | INTRAMUSCULAR | Status: AC
  Administered 2013-08-24: 10 mg via INTRAVENOUS

## 2013-08-24 MED ORDER — ONDANSETRON 8 MG/50ML IVPB (CHCC)
8.0000 mg | Freq: Once | INTRAVENOUS | Status: AC
  Administered 2013-08-24: 8 mg via INTRAVENOUS

## 2013-08-24 MED ORDER — HEPARIN SOD (PORK) LOCK FLUSH 100 UNIT/ML IV SOLN
500.0000 [IU] | Freq: Once | INTRAVENOUS | Status: AC | PRN
  Administered 2013-08-24: 500 [IU]
  Filled 2013-08-24: qty 5

## 2013-08-24 MED ORDER — DOXORUBICIN HCL LIPOSOMAL CHEMO INJECTION 2 MG/ML
39.0000 mg/m2 | Freq: Once | INTRAVENOUS | Status: AC
  Administered 2013-08-24: 60 mg via INTRAVENOUS
  Filled 2013-08-24: qty 30

## 2013-08-24 NOTE — Patient Instructions (Addendum)
[facility] Center Discharge Instructions for Patients Receiving Chemotherapy  Today you received the following chemotherapy agents Doxil  To help prevent nausea and vomiting after your treatment, we encourage you to take your nausea medication    If you develop nausea and vomiting that is not controlled by your nausea medication, call the clinic.   BELOW ARE SYMPTOMS THAT SHOULD BE REPORTED IMMEDIATELY:  *FEVER GREATER THAN 100.5 F  *CHILLS WITH OR WITHOUT FEVER  NAUSEA AND VOMITING THAT IS NOT CONTROLLED WITH YOUR NAUSEA MEDICATION  *UNUSUAL SHORTNESS OF BREATH  *UNUSUAL BRUISING OR BLEEDING  TENDERNESS IN MOUTH AND THROAT WITH OR WITHOUT PRESENCE OF ULCERS  *URINARY PROBLEMS  *BOWEL PROBLEMS  UNUSUAL RASH Items with * indicate a potential emergency and should be followed up as soon as possible.  Feel free to call the clinic you have any questions or concerns. The clinic phone number is [phone].    

## 2013-08-24 NOTE — Progress Notes (Signed)
Pt did not want a AVS.

## 2013-08-24 NOTE — Progress Notes (Signed)
Since her appointment here last week on 08/16/2013, she again had CTs of the chest/abdomen/pelvis on September 4. For the most part these confirmed predominantly stable disease in the abdomen and pelvis. The only exception is a lesion in the liver which has increased slightly in size, currently measuring 2.5 x 2.0 cm, with some new intrahepatic biliary ductal dilatation distal to this lesion. Again all other liver lesions were stable.   Also noted was a new compression fracture at L3, consistent with the area of pain that Ashtan Laton mentioned last week in her lower back. She tells me the pain has actually continued to improve, and she is doing yoga and seeing an acupuncturist. I offered a spinal MRI for consideration of kyphoplasty, and at this time she declines. She knows to call us, however, if she has any increased back pain.   Dr. Darnelle Catalan has reviewed the results of the scans today. As stated above, he feels that predominantly there is stability of her disease. We will continue with Doxil, but he has suggested treating her on a Q. three-week basis instead of every 4 weeks, and repeat a liver MRI in approximately 2-3 months.   Kenyada Dosch is scheduled for treatment today. She'll then be leaving for a 2 to three-week trip to [place] to visit her aunt and her daughter, but we will hope to treat her 4 weeks from now on October 3. From that point forward, we will try to treat her every 3 weeks. She will give [place] a call as soon as she knows her exact date of return from [place] and we will schedule her appointments accordingly.  [name], PA-C 07/24/2013

## 2013-09-06 ENCOUNTER — Telehealth: Payer: Self-pay | Admitting: *Deleted

## 2013-09-06 NOTE — Telephone Encounter (Signed)
Patient states she was instructed by [name], PA to call when she could get back to town from [place] for treatment. Patient would like appt on 09/17/13. May be reached at aunts home until 09/15/13 @ [phone], ok to leave message with home health staff if she is not there.

## 2013-09-07 ENCOUNTER — Other Ambulatory Visit: Payer: Self-pay | Admitting: Physician Assistant

## 2013-09-07 DIAGNOSIS — C569 Malignant neoplasm of unspecified ovary: Secondary | ICD-10-CM

## 2013-09-11 ENCOUNTER — Other Ambulatory Visit: Payer: Self-pay | Admitting: Physician Assistant

## 2013-09-11 ENCOUNTER — Telehealth: Payer: Self-pay | Admitting: *Deleted

## 2013-09-11 ENCOUNTER — Telehealth: Payer: Self-pay | Admitting: Oncology

## 2013-09-11 NOTE — Telephone Encounter (Signed)
S/w the pt and she is aware of her 09/17/2013 appt. 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 Per pt she will call back to r/s.

## 2013-09-11 NOTE — Telephone Encounter (Signed)
Per staff message and POF I have scheduled appts.  JMW  

## 2013-09-14 ENCOUNTER — Telehealth: Payer: Self-pay | Admitting: Oncology

## 2013-09-14 ENCOUNTER — Encounter: Payer: Self-pay | Admitting: Oncology

## 2013-09-14 NOTE — Telephone Encounter (Signed)
Added lb/JH/tx for 10/20 per 9/23 pof. Called pt re appts but was not able to reach her. Per message subscriber not accepting incoming calls. Mailed schedule. Pt also has existing appt for 9/29. Added comment to 9/29 appt to send pt for new schedule.

## 2013-09-17 ENCOUNTER — Other Ambulatory Visit (HOSPITAL_BASED_OUTPATIENT_CLINIC_OR_DEPARTMENT_OTHER): Payer: Medicare Other | Admitting: Lab

## 2013-09-17 ENCOUNTER — Ambulatory Visit (HOSPITAL_BASED_OUTPATIENT_CLINIC_OR_DEPARTMENT_OTHER): Payer: Medicare Other

## 2013-09-17 VITALS — BP 141/68 | HR 71 | Temp 98.0°F

## 2013-09-17 DIAGNOSIS — C569 Malignant neoplasm of unspecified ovary: Secondary | ICD-10-CM

## 2013-09-17 DIAGNOSIS — Z5111 Encounter for antineoplastic chemotherapy: Secondary | ICD-10-CM

## 2013-09-17 LAB — COMPREHENSIVE METABOLIC PANEL (CC13)
Albumin: 3.4 g/dL — ABNORMAL LOW (ref 3.5–5.0)
Alkaline Phosphatase: 86 U/L (ref 40–150)
BUN: 12.4 mg/dL (ref 7.0–26.0)
CO2: 24 mEq/L (ref 22–29)
Glucose: 90 mg/dl (ref 70–140)
Potassium: 3.8 mEq/L (ref 3.5–5.1)
Sodium: 141 mEq/L (ref 136–145)
Total Protein: 6.5 g/dL (ref 6.4–8.3)

## 2013-09-17 LAB — CBC WITH DIFFERENTIAL/PLATELET
Basophils Absolute: 0 10*3/uL (ref 0.0–0.1)
EOS%: 0 % (ref 0.0–7.0)
Eosinophils Absolute: 0 10*3/uL (ref 0.0–0.5)
HCT: 35.9 % (ref 34.8–46.6)
HGB: 12.2 g/dL (ref 11.6–15.9)
MCH: 31.9 pg (ref 25.1–34.0)
MCV: 94 fL (ref 79.5–101.0)
MONO%: 15.4 % — ABNORMAL HIGH (ref 0.0–14.0)
NEUT#: 1.2 10*3/uL — ABNORMAL LOW (ref 1.5–6.5)
NEUT%: 37.2 % — ABNORMAL LOW (ref 38.4–76.8)
lymph#: 1.5 10*3/uL (ref 0.9–3.3)

## 2013-09-17 MED ORDER — HEPARIN SOD (PORK) LOCK FLUSH 100 UNIT/ML IV SOLN
500.0000 [IU] | Freq: Once | INTRAVENOUS | Status: AC | PRN
  Administered 2013-09-17: 500 [IU]
  Filled 2013-09-17: qty 5

## 2013-09-17 MED ORDER — ONDANSETRON 8 MG/50ML IVPB (CHCC)
8.0000 mg | Freq: Once | INTRAVENOUS | Status: AC
  Administered 2013-09-17: 8 mg via INTRAVENOUS

## 2013-09-17 MED ORDER — DEXAMETHASONE SODIUM PHOSPHATE 10 MG/ML IJ SOLN
10.0000 mg | Freq: Once | INTRAMUSCULAR | Status: AC
  Administered 2013-09-17: 10 mg via INTRAVENOUS

## 2013-09-17 MED ORDER — SODIUM CHLORIDE 0.9 % IJ SOLN
10.0000 mL | INTRAMUSCULAR | Status: DC | PRN
  Administered 2013-09-17: 10 mL
  Filled 2013-09-17: qty 10

## 2013-09-17 MED ORDER — SODIUM CHLORIDE 0.9 % IV SOLN
Freq: Once | INTRAVENOUS | Status: AC
  Administered 2013-09-17: 16:00:00 via INTRAVENOUS

## 2013-09-17 MED ORDER — DOXORUBICIN HCL LIPOSOMAL CHEMO INJECTION 2 MG/ML
39.0000 mg/m2 | Freq: Once | INTRAVENOUS | Status: AC
  Administered 2013-09-17: 60 mg via INTRAVENOUS
  Filled 2013-09-17: qty 30

## 2013-09-17 NOTE — Progress Notes (Signed)
ANC 1.2, per Dr Darnelle Catalan ok to tx.  SLJ

## 2013-09-17 NOTE — Patient Instructions (Signed)
[facility] Discharge Instructions for Patients Receiving Chemotherapy  Today you received the following chemotherapy agents doxil  To help prevent nausea and vomiting after your treatment, we encourage you to take your nausea medication as needed   If you develop nausea and vomiting that is not controlled by your nausea medication, call the clinic.   BELOW ARE SYMPTOMS THAT SHOULD BE REPORTED IMMEDIATELY:  *FEVER GREATER THAN 100.5 F  *CHILLS WITH OR WITHOUT FEVER  NAUSEA AND VOMITING THAT IS NOT CONTROLLED WITH YOUR NAUSEA MEDICATION  *UNUSUAL SHORTNESS OF BREATH  *UNUSUAL BRUISING OR BLEEDING  TENDERNESS IN MOUTH AND THROAT WITH OR WITHOUT PRESENCE OF ULCERS  *URINARY PROBLEMS  *BOWEL PROBLEMS  UNUSUAL RASH Items with * indicate a potential emergency and should be followed up as soon as possible.  Feel free to call the clinic you have any questions or concerns. The clinic phone number is [phone].

## 2013-09-20 ENCOUNTER — Encounter: Payer: Self-pay | Admitting: Oncology

## 2013-09-26 ENCOUNTER — Other Ambulatory Visit: Payer: Medicare Other | Admitting: Lab

## 2013-09-26 ENCOUNTER — Ambulatory Visit: Payer: Medicare Other | Admitting: Physician Assistant

## 2013-09-26 ENCOUNTER — Ambulatory Visit: Payer: Medicare Other

## 2013-09-27 ENCOUNTER — Other Ambulatory Visit: Payer: Self-pay | Admitting: *Deleted

## 2013-09-27 NOTE — Progress Notes (Signed)
Pt left message stating she has new job and would like her appts for lab, visit and treatment to be on 10/15 late afternoon.  This RN reviewed schedules and noted no openings on AB and GM book- [facility] is out of office that day.  This RN left message on pt's identified VM- inquiring if she could do lab and visit on 10/14 late afternoon and then treatment on 10/15 late afternoon. This RN's name and return call number given.

## 2013-10-02 ENCOUNTER — Other Ambulatory Visit: Payer: Self-pay | Admitting: *Deleted

## 2013-10-03 ENCOUNTER — Ambulatory Visit (HOSPITAL_BASED_OUTPATIENT_CLINIC_OR_DEPARTMENT_OTHER): Payer: Medicare Other

## 2013-10-03 ENCOUNTER — Ambulatory Visit (HOSPITAL_BASED_OUTPATIENT_CLINIC_OR_DEPARTMENT_OTHER): Payer: Medicare Other | Admitting: Lab

## 2013-10-03 ENCOUNTER — Other Ambulatory Visit: Payer: Self-pay | Admitting: Physician Assistant

## 2013-10-03 VITALS — BP 149/72 | HR 71 | Temp 97.8°F

## 2013-10-03 DIAGNOSIS — C569 Malignant neoplasm of unspecified ovary: Secondary | ICD-10-CM

## 2013-10-03 DIAGNOSIS — Z5111 Encounter for antineoplastic chemotherapy: Secondary | ICD-10-CM

## 2013-10-03 DIAGNOSIS — C775 Secondary and unspecified malignant neoplasm of intrapelvic lymph nodes: Secondary | ICD-10-CM

## 2013-10-03 LAB — COMPREHENSIVE METABOLIC PANEL (CC13)
ALT: 9 U/L (ref 0–55)
AST: 16 U/L (ref 5–34)
Anion Gap: 8 mEq/L (ref 3–11)
CO2: 24 mEq/L (ref 22–29)
Calcium: 9.2 mg/dL (ref 8.4–10.4)
Chloride: 108 mEq/L (ref 98–109)
Creatinine: 0.7 mg/dL (ref 0.6–1.1)
Potassium: 3.8 mEq/L (ref 3.5–5.1)
Sodium: 140 mEq/L (ref 136–145)
Total Protein: 6.6 g/dL (ref 6.4–8.3)

## 2013-10-03 LAB — CBC WITH DIFFERENTIAL/PLATELET
Basophils Absolute: 0 10*3/uL (ref 0.0–0.1)
Eosinophils Absolute: 0 10*3/uL (ref 0.0–0.5)
HGB: 11.2 g/dL — ABNORMAL LOW (ref 11.6–15.9)
MCV: 94.6 fL (ref 79.5–101.0)
MONO#: 0.2 10*3/uL (ref 0.1–0.9)
MONO%: 7.7 % (ref 0.0–14.0)
NEUT#: 1.1 10*3/uL — ABNORMAL LOW (ref 1.5–6.5)
RBC: 3.52 10*6/uL — ABNORMAL LOW (ref 3.70–5.45)
RDW: 16.1 % — ABNORMAL HIGH (ref 11.2–14.5)
WBC: 2.6 10*3/uL — ABNORMAL LOW (ref 3.9–10.3)
lymph#: 1.2 10*3/uL (ref 0.9–3.3)
nRBC: 0 % (ref 0–0)

## 2013-10-03 LAB — CA 125: CA 125: 3334.9 U/mL — ABNORMAL HIGH (ref 0.0–30.2)

## 2013-10-03 MED ORDER — DEXAMETHASONE SODIUM PHOSPHATE 10 MG/ML IJ SOLN
INTRAMUSCULAR | Status: AC
  Filled 2013-10-03: qty 1

## 2013-10-03 MED ORDER — SODIUM CHLORIDE 0.9 % IV SOLN
Freq: Once | INTRAVENOUS | Status: AC
  Administered 2013-10-03: 14:00:00 via INTRAVENOUS

## 2013-10-03 MED ORDER — DEXAMETHASONE SODIUM PHOSPHATE 10 MG/ML IJ SOLN
10.0000 mg | Freq: Once | INTRAMUSCULAR | Status: AC
  Administered 2013-10-03: 10 mg via INTRAVENOUS

## 2013-10-03 MED ORDER — ONDANSETRON 8 MG/NS 50 ML IVPB
INTRAVENOUS | Status: AC
  Filled 2013-10-03: qty 8

## 2013-10-03 MED ORDER — ONDANSETRON 8 MG/50ML IVPB (CHCC)
8.0000 mg | Freq: Once | INTRAVENOUS | Status: AC
  Administered 2013-10-03: 8 mg via INTRAVENOUS

## 2013-10-03 MED ORDER — HEPARIN SOD (PORK) LOCK FLUSH 100 UNIT/ML IV SOLN
500.0000 [IU] | Freq: Once | INTRAVENOUS | Status: AC | PRN
  Administered 2013-10-03: 500 [IU]
  Filled 2013-10-03: qty 5

## 2013-10-03 MED ORDER — SODIUM CHLORIDE 0.9 % IJ SOLN
10.0000 mL | INTRAMUSCULAR | Status: DC | PRN
  Administered 2013-10-03: 10 mL
  Filled 2013-10-03: qty 10

## 2013-10-03 MED ORDER — DOXORUBICIN HCL LIPOSOMAL CHEMO INJECTION 2 MG/ML
39.0000 mg/m2 | Freq: Once | INTRAVENOUS | Status: AC
  Administered 2013-10-03: 60 mg via INTRAVENOUS
  Filled 2013-10-03: qty 30

## 2013-10-03 NOTE — Patient Instructions (Signed)
[facility] Discharge Instructions for Patients Receiving Chemotherapy  Today you received the following chemotherapy agents doxil To help prevent nausea and vomiting after your treatment, we encourage you to take your nausea medication as needed   If you develop nausea and vomiting that is not controlled by your nausea medication, call the clinic.   BELOW ARE SYMPTOMS THAT SHOULD BE REPORTED IMMEDIATELY:  *FEVER GREATER THAN 100.5 F  *CHILLS WITH OR WITHOUT FEVER  NAUSEA AND VOMITING THAT IS NOT CONTROLLED WITH YOUR NAUSEA MEDICATION  *UNUSUAL SHORTNESS OF BREATH  *UNUSUAL BRUISING OR BLEEDING  TENDERNESS IN MOUTH AND THROAT WITH OR WITHOUT PRESENCE OF ULCERS  *URINARY PROBLEMS  *BOWEL PROBLEMS  UNUSUAL RASH Items with * indicate a potential emergency and should be followed up as soon as possible.  Feel free to call the clinic you have any questions or concerns. The clinic phone number is [phone].

## 2013-10-03 NOTE — Progress Notes (Signed)
Cassandra Nichols is currently being treated with Doxil, the plan being to treat every 3 weeks. Her last dose was given on September 29. She was originally scheduled for her next dose this coming Monday, October 20. Due to her work schedule, however, she contacted the office earlier this week and requested that her treatment be moved up from October 20 2 today, October 15.  Cassandra Nichols's counts are slightly low, with an ANC of 1.1, and platelets of 97,000. I have reviewed this case and Dory Larsen labs with Dr. Darnelle Catalan today. He is comfortable with moving forward with her treatment today since, otherwise, it would be at least 3 more weeks before she would be able to come in and be treated (per her work schedule).  We will plan on repeating her labs in 7-10 days, and we'll try to see her for physical exam when she returns for her next dose of Doxil.  [name], PA-C 10/03/2013

## 2013-10-03 NOTE — Progress Notes (Signed)
Per Dr Darnelle Catalan, okay to tx today with Doxil with CBC results 10/03/13.

## 2013-10-05 ENCOUNTER — Telehealth: Payer: Self-pay | Admitting: *Deleted

## 2013-10-05 ENCOUNTER — Other Ambulatory Visit: Payer: Self-pay | Admitting: *Deleted

## 2013-10-05 NOTE — Telephone Encounter (Signed)
Lm informed the pt that [facility] is on pal 10/20 and her arrival time for 09/2013 has changed. gv appt time for 10/08/13 w/labs@ 1:45p, ov@ 2:15pm, and tx to follow...td

## 2013-10-05 NOTE — Progress Notes (Signed)
This RN spoke with pt and scheduled lab only late afternoon on 10/23.  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

## 2013-10-08 ENCOUNTER — Other Ambulatory Visit: Payer: Medicare Other | Admitting: Lab

## 2013-10-08 ENCOUNTER — Ambulatory Visit: Payer: Medicare Other

## 2013-10-08 ENCOUNTER — Telehealth: Payer: Self-pay | Admitting: Oncology

## 2013-10-08 ENCOUNTER — Ambulatory Visit: Payer: Medicare Other | Admitting: Physician Assistant

## 2013-10-08 ENCOUNTER — Telehealth: Payer: Self-pay | Admitting: *Deleted

## 2013-10-08 ENCOUNTER — Ambulatory Visit: Payer: Medicare Other | Admitting: Family

## 2013-10-08 NOTE — Telephone Encounter (Signed)
Sent michelle a staff message to add the chemo appt. °

## 2013-10-08 NOTE — Telephone Encounter (Signed)
lmonvm of the pt regarding her nov appts.

## 2013-10-08 NOTE — Telephone Encounter (Signed)
Per staff message and POF I have scheduled appts.  JMW  

## 2013-10-11 ENCOUNTER — Other Ambulatory Visit (HOSPITAL_BASED_OUTPATIENT_CLINIC_OR_DEPARTMENT_OTHER): Payer: Medicare Other | Admitting: Lab

## 2013-10-11 ENCOUNTER — Other Ambulatory Visit: Payer: Medicare Other | Admitting: Lab

## 2013-10-11 DIAGNOSIS — C569 Malignant neoplasm of unspecified ovary: Secondary | ICD-10-CM

## 2013-10-11 LAB — CBC WITH DIFFERENTIAL/PLATELET
BASO%: 1.2 % (ref 0.0–2.0)
Basophils Absolute: 0 10*3/uL (ref 0.0–0.1)
EOS%: 0.4 % (ref 0.0–7.0)
HGB: 10.5 g/dL — ABNORMAL LOW (ref 11.6–15.9)
MCH: 32.1 pg (ref 25.1–34.0)
MCHC: 33.9 g/dL (ref 31.5–36.0)
MCV: 94.8 fL (ref 79.5–101.0)
MONO%: 4.3 % (ref 0.0–14.0)
NEUT%: 61.8 % (ref 38.4–76.8)
RDW: 16.9 % — ABNORMAL HIGH (ref 11.2–14.5)

## 2013-10-15 ENCOUNTER — Other Ambulatory Visit: Payer: Self-pay | Admitting: Physician Assistant

## 2013-10-30 ENCOUNTER — Encounter (INDEPENDENT_AMBULATORY_CARE_PROVIDER_SITE_OTHER): Payer: Self-pay

## 2013-10-30 ENCOUNTER — Ambulatory Visit (HOSPITAL_BASED_OUTPATIENT_CLINIC_OR_DEPARTMENT_OTHER): Payer: Medicare Other

## 2013-10-30 ENCOUNTER — Other Ambulatory Visit: Payer: Self-pay | Admitting: Oncology

## 2013-10-30 ENCOUNTER — Other Ambulatory Visit: Payer: Self-pay | Admitting: *Deleted

## 2013-10-30 ENCOUNTER — Other Ambulatory Visit (HOSPITAL_BASED_OUTPATIENT_CLINIC_OR_DEPARTMENT_OTHER): Payer: Medicare Other | Admitting: Lab

## 2013-10-30 ENCOUNTER — Telehealth: Payer: Self-pay | Admitting: *Deleted

## 2013-10-30 ENCOUNTER — Encounter: Payer: Self-pay | Admitting: Physician Assistant

## 2013-10-30 ENCOUNTER — Ambulatory Visit (HOSPITAL_BASED_OUTPATIENT_CLINIC_OR_DEPARTMENT_OTHER): Payer: Medicare Other | Admitting: Physician Assistant

## 2013-10-30 ENCOUNTER — Telehealth: Payer: Self-pay | Admitting: Oncology

## 2013-10-30 VITALS — BP 147/83 | HR 99 | Temp 98.3°F | Resp 20 | Ht 59.0 in | Wt 121.0 lb

## 2013-10-30 DIAGNOSIS — Z5111 Encounter for antineoplastic chemotherapy: Secondary | ICD-10-CM

## 2013-10-30 DIAGNOSIS — J069 Acute upper respiratory infection, unspecified: Secondary | ICD-10-CM

## 2013-10-30 DIAGNOSIS — C569 Malignant neoplasm of unspecified ovary: Secondary | ICD-10-CM

## 2013-10-30 DIAGNOSIS — K1231 Oral mucositis (ulcerative) due to antineoplastic therapy: Secondary | ICD-10-CM

## 2013-10-30 LAB — COMPREHENSIVE METABOLIC PANEL (CC13)
ALT: 12 U/L (ref 0–55)
AST: 19 U/L (ref 5–34)
Alkaline Phosphatase: 85 U/L (ref 40–150)
Sodium: 139 mEq/L (ref 136–145)
Total Bilirubin: 0.36 mg/dL (ref 0.20–1.20)
Total Protein: 6.7 g/dL (ref 6.4–8.3)

## 2013-10-30 LAB — CBC WITH DIFFERENTIAL/PLATELET
BASO%: 0.9 % (ref 0.0–2.0)
LYMPH%: 30.2 % (ref 14.0–49.7)
MCHC: 32.8 g/dL (ref 31.5–36.0)
MCV: 96.3 fL (ref 79.5–101.0)
MONO%: 17.4 % — ABNORMAL HIGH (ref 0.0–14.0)
Platelets: 155 10*3/uL (ref 145–400)
RBC: 2.98 10*6/uL — ABNORMAL LOW (ref 3.70–5.45)
RDW: 17.6 % — ABNORMAL HIGH (ref 11.2–14.5)
WBC: 3.3 10*3/uL — ABNORMAL LOW (ref 3.9–10.3)

## 2013-10-30 MED ORDER — ONDANSETRON 8 MG/NS 50 ML IVPB
INTRAVENOUS | Status: AC
  Filled 2013-10-30: qty 8

## 2013-10-30 MED ORDER — HEPARIN SOD (PORK) LOCK FLUSH 100 UNIT/ML IV SOLN
500.0000 [IU] | Freq: Once | INTRAVENOUS | Status: AC | PRN
  Administered 2013-10-30: 500 [IU]
  Filled 2013-10-30: qty 5

## 2013-10-30 MED ORDER — DEXAMETHASONE SODIUM PHOSPHATE 10 MG/ML IJ SOLN
INTRAMUSCULAR | Status: AC
  Filled 2013-10-30: qty 1

## 2013-10-30 MED ORDER — SODIUM CHLORIDE 0.9 % IV SOLN
Freq: Once | INTRAVENOUS | Status: AC
  Administered 2013-10-30: 15:00:00 via INTRAVENOUS

## 2013-10-30 MED ORDER — DEXAMETHASONE SODIUM PHOSPHATE 10 MG/ML IJ SOLN
10.0000 mg | Freq: Once | INTRAMUSCULAR | Status: AC
  Administered 2013-10-30: 10 mg via INTRAVENOUS

## 2013-10-30 MED ORDER — DOXORUBICIN HCL LIPOSOMAL CHEMO INJECTION 2 MG/ML
39.0000 mg/m2 | Freq: Once | INTRAVENOUS | Status: AC
  Administered 2013-10-30: 60 mg via INTRAVENOUS
  Filled 2013-10-30: qty 30

## 2013-10-30 MED ORDER — SODIUM CHLORIDE 0.9 % IJ SOLN
10.0000 mL | INTRAMUSCULAR | Status: DC | PRN
  Administered 2013-10-30: 10 mL
  Filled 2013-10-30: qty 10

## 2013-10-30 MED ORDER — ONDANSETRON 8 MG/50ML IVPB (CHCC)
8.0000 mg | Freq: Once | INTRAVENOUS | Status: AC
  Administered 2013-10-30: 8 mg via INTRAVENOUS

## 2013-10-30 NOTE — Telephone Encounter (Signed)
Per staff message and POF I have scheduled appts.  JMW  

## 2013-10-30 NOTE — Patient Instructions (Signed)
[facility] Discharge Instructions for Patients Receiving Chemotherapy  Today you received the following chemotherapy agents Doxil.  To help prevent nausea and vomiting after your treatment, we encourage you to take your nausea medication as prescribed.   If you develop nausea and vomiting that is not controlled by your nausea medication, call the clinic.   BELOW ARE SYMPTOMS THAT SHOULD BE REPORTED IMMEDIATELY:  *FEVER GREATER THAN 100.5 F  *CHILLS WITH OR WITHOUT FEVER  NAUSEA AND VOMITING THAT IS NOT CONTROLLED WITH YOUR NAUSEA MEDICATION  *UNUSUAL SHORTNESS OF BREATH  *UNUSUAL BRUISING OR BLEEDING  TENDERNESS IN MOUTH AND THROAT WITH OR WITHOUT PRESENCE OF ULCERS  *URINARY PROBLEMS  *BOWEL PROBLEMS  UNUSUAL RASH Items with * indicate a potential emergency and should be followed up as soon as possible.  Feel free to call the clinic you have any questions or concerns. The clinic phone number is [phone].    

## 2013-10-30 NOTE — Progress Notes (Signed)
ID: [name]   DOB: [birth year]  MR#: [id number]  QMV#:[id number]   PCP:  Hollice Espy, MD   CHIEF COMPLAINT:  Recurrent Ovarian Cancer   HISTORY OF PRESENT ILLNESS: Cassandra Nichols was initially diagnosed with ovarian cancer in July of 2002. She underwent debulking surgery at that time for what proved to be a 4 of 18 lymph nodes positive disease. She was initially treated with 6 cycles of paclitaxel and carboplatin adjuvantly, completed in December of 2002.  Patient has had multiple recurrences since December of 2002, and has been treated with multiple agents as noted below.  INTERVAL HISTORY: Cassandra Nichols returns today for followup of her ovarian cancer. As a brief recap, she was receiving Doxil every 4 weeks between July of 2013 in May of 2014, with multiple delays due to to the patient's work schedule. She then took a break between May and August of 2014, and resumed Doxil on 08/24/2013. The plan is to treat every 3 weeks, but again this is often delayed due to her work schedule. Today is her fourth dose of Doxil since restarting therapy 08/24/2013.   Overall, Cassandra Nichols tells me this last cycle was a little tough for on her. 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 She did have increase sensitivity in the soles of the feet following this last cycle of Doxil. For couple a day she had difficulty walking. This fortunately has resolved. She denies any cracking or peeling of the skin, neither the soles of the feet nor the palms of the hands. In fact the hands themselves have not been painful or sensitive. She's had some mouth ulcerations, and continues on acyclovir twice daily plus Magic mouthwash as needed. She does find this helpful. She is able to eat and drink. Of note, she also lost a left upper tooth several weeks ago. She tells me the plan had been to pull the tooth anyway and this "saved her some money". Her gums are slightly sore, but seem to  be healing well. She is followed by her dentist as needed.  Otherwise, Cassandra Nichols has also had an upper respiratory infection which is slowly resolving. She had some low-grade fevers last week, but has had no fever this week. She had a "wet cough". She's had a lot of nasal congestion and postnasal drip. She's currently taking Claritin and Nasacort nasal spray. Again, all of these symptoms are gradually improving. She's had no shortness of breath, and also denies any orthopnea.  REVIEW OF SYSTEMS: Cassandra Nichols denies any night sweats or hot flashes. She denies any rashes, skin changes, abnormal bruising and has had no abnormal bleeding. She's eating and drinking well with no nausea and no change in bowel or bladder habits.  She does tend to have some constipation for one to 2 days following treatment, but it resolves on its own. She has mild urinary stress incontinence which is stable. She denies any chest pain, or palpitations. She's had no abnormal headaches, dizziness, or change in vision, and also denies any unusual myalgias, arthralgias, or bony pain. She has no increased peripheral neuropathy or peripheral swelling.  A detailed review of systems is otherwise stable and noncontributory.    PAST MEDICAL HISTORY: Past Medical History  Diagnosis Date  . Shingles   . Ovarian cancer 11/17/2011    PAST SURGICAL HISTORY: Past Surgical History  Procedure Laterality Date  . Ganglion cyst excision Left  Wrist    FAMILY HISTORY Family History  Problem Relation Age of Onset  . Hypertension Mother   . GER disease Mother   . Cancer Father     Cancer (stomach)    GYNECOLOGIC HISTORY:  SOCIAL HISTORY: Cassandra Nichols currently lives alone. Her daughter Cassandra Nichols lives in [place] and is Interior and spatial designer of the epidemiology and genetics Center there; she is married with 2 children. Son  Cassandra Nichols lives in [place]. He has 4 children of his own.   ADVANCED DIRECTIVES: in place  HEALTH MAINTENANCE: History  Substance  Use Topics  . Smoking status: Former Games developer  . Smokeless tobacco: Never Used  . Alcohol Use: No     Comment: Occasionally     Colonoscopy:  PAP:  Bone density:  Lipid panel:    Allergies  Allergen Reactions  . Penicillins Rash    Current Outpatient Prescriptions  Medication Sig Dispense Refill  . acyclovir (ZOVIRAX) 400 MG tablet TAKE 1 TABLET TWICE A DAY  60 tablet  2  . Alum & Mag Hydroxide-Simeth (MAGIC MOUTHWASH W/LIDOCAINE) SOLN Take 5 mLs by mouth 4 (four) times daily as needed.  240 mL  2  . ibuprofen (ADVIL,MOTRIN) 800 MG tablet Take 800 mg by mouth every 8 (eight) hours as needed.        . lidocaine (XYLOCAINE) 2 % solution TAKE 5 ML BY MOUTH 4 TIMES A DAY AS NEEDED  240 mL  0  . ondansetron (ZOFRAN) 8 MG tablet 8 mg every 8 (eight) hours as needed for nausea.       . prochlorperazine (COMPAZINE) 10 MG tablet Take 10 mg by mouth every 6 (six) hours as needed.         No current facility-administered medications for this visit.    OBJECTIVE: Middle-aged white woman in no acute distress Filed Vitals:   10/30/13 1401  BP: 147/83  Pulse: 99  Temp: 98.3 F (36.8 C)  Resp: 20     Body mass index is 24.43 kg/(m^2).    ECOG FS: 1 Filed Weights   10/30/13 1401  Weight: 121 lb (54.885 kg)   Physical Exam: HEENT:  Sclerae anicteric.  Poor dentition.  Oropharynx is notable for mild mucositis, with a small ulceration in the posterior right buccal mucosa and inside the lower lip. No evidence of oropharyngeal candidiasis. Buccal mucosa is moist. NODES:  No cervical, supraclavicular, or axillary lymphadenopathy palpated.  BREAST EXAM:  Deferred. LUNGS:  Clear to auscultation bilaterally.  No wheezes or rhonchi. HEART:  Regular rate and rhythm. No murmur appreciated. ABDOMEN:  Soft, nontender.  Positive bowel sounds.  MSK:  No focal spinal tenderness to palpation. Good range of motion in the upper extremities. No joint swelling. EXTREMITIES:  No peripheral edema.   SKIN:   No cracking or peeling noted. No evidence of PPE on exam. NEURO:  Nonfocal. Well oriented.  Fatigued affect.     LAB RESULTS: Lab Results  Component Value Date   WBC 3.3* 10/30/2013   NEUTROABS 1.7 10/30/2013   HGB 9.4* 10/30/2013   HCT 28.7* 10/30/2013   MCV 96.3 10/30/2013   PLT 155 10/30/2013      Chemistry      Component Value Date/Time   NA 140 10/03/2013 1258   NA 140 07/04/2012 0832   K 3.8 10/03/2013 1258   K 4.2 07/04/2012 0832   CL 106 05/09/2013 1304   CL 106 07/04/2012 0832   CO2 24 10/03/2013 1258   CO2 26 07/04/2012 1610  BUN 13.9 10/03/2013 1258   BUN 17 07/04/2012 0832   CREATININE 0.7 10/03/2013 1258   CREATININE 0.82 07/04/2012 0832      Component Value Date/Time   CALCIUM 9.2 10/03/2013 1258   CALCIUM 9.2 07/04/2012 0832   ALKPHOS 75 10/03/2013 1258   ALKPHOS 71 07/04/2012 0832   AST 16 10/03/2013 1258   AST 18 07/04/2012 0832   ALT 9 10/03/2013 1258   ALT 12 07/04/2012 0832   BILITOT 0.64 10/03/2013 1258   BILITOT 0.7 07/04/2012 0832      CA 125  3334.9  10/03/2013    2907.6  09/17/2013    1678.5  08/24/2013    1869.2  08/13/2013    1814.8  05/09/2013    STUDIES: Echocardiogram on 08/13/2013 confirmed a stable ejection fraction of 60-65%.   Mammography February 2013 was benign.     ASSESSMENT: 72 year old [place] woman   (1)  with a history of ovarian cancer dating back to July 2002 when she underwent debulking surgery for what proved to be 4/18 lymph node positive disease.   (2)  She received 6 cycles of paclitaxel/carboplatin adjuvantly completed in December 2002.   (3)  She had her first recurrence in December 2005 treated with paclitaxel;   (4)  second recurrence May 2007 treated with [place] and paclitaxel;   (5)  third recurrence January 2010 treated with topotecan and Gemzar;   (6)  fourth recurrence January 2012 treated with single-agent carboplatin (January to August 2012) with initial response, then plateau; with  progression January 2013 (less than 6 months after last carbo dose) treated with single-agent gemzar given Q2 weeks (January to April 2013), with evidence of progression  (7) Doxil strartedJuly 2013, with evidence of response, and fair tolerance.  Being treated every 4 weeks. Held between May and August 2014 due to patient's work schedule and scheduling issues.  Resumed treatment as of 08/24/2013, the plan being to treat every 3 weeks, but again often delayed secondary to the patient's schedule .  PLAN:  Cassandra Nichols will proceed to treatment today as scheduled for her next q. three-week dose of Doxil. We will continue to treat her approximately every 3 weeks, and her next 3 treatments are scheduled for December 3, December 22, and January 13 per her request. I will see her again on December 22, and she will see Dr. Darnelle Catalan in mid January. At that time, they will review her treatment plan and discuss when to obtain restaging studies. She is due for her next echocardiogram in late November, and that has been requested for November 26.   With regards to her recent upper respiratory infection, I think this is resolving. I did ask her to contact us if she has any worsening symptoms, shortness of breath, or fever, and at that point we certainly could consider an antibiotic. In the meanwhile she will continue over-the-counter medications symptomatically.   She'll continue on the acyclovir and use Magic mouthwash for mild mucositis. We'll follow her very closely for both mucositis and skin changes associated with the Doxil. Hopefully these will not cause an additional delay or make it necessary to stop the Doxil.  Of note, we continue to see an increase in [place] CA 125, and I have encouraged her to stay on schedule as much as possible with her treatment. If this continues to increase over the next few weeks, we may consider going ahead and restaging Cassandra Nichols in early January before she sees Dr. Darnelle Catalan on January  13.    Cassandra Nichols is aware that she is past due for her screening mammogram, and  she plans on scheduling this "after the first of the year".   Patient voices understanding and agreement with our plan, and as always knows to call with any changes or problems.    Alysson Geist PA-C     10/30/2013

## 2013-10-31 ENCOUNTER — Telehealth: Payer: Self-pay | Admitting: Oncology

## 2013-10-31 NOTE — Telephone Encounter (Signed)
, °

## 2013-11-07 ENCOUNTER — Other Ambulatory Visit: Payer: Self-pay | Admitting: Oncology

## 2013-11-08 ENCOUNTER — Other Ambulatory Visit: Payer: Self-pay | Admitting: Physician Assistant

## 2013-11-09 ENCOUNTER — Telehealth: Payer: Self-pay | Admitting: Oncology

## 2013-11-12 ENCOUNTER — Telehealth: Payer: Self-pay | Admitting: *Deleted

## 2013-11-12 NOTE — Telephone Encounter (Signed)
Per staff message and POF I have scheduled appts.  JMW  

## 2013-11-14 ENCOUNTER — Ambulatory Visit (HOSPITAL_COMMUNITY)
Admission: RE | Admit: 2013-11-14 | Discharge: 2013-11-14 | Disposition: A | Discharge status: Home / Self Care | Payer: Medicare Other | Source: Ambulatory Visit | Attending: Oncology | Admitting: Oncology

## 2013-11-14 DIAGNOSIS — Z09 Encounter for follow-up examination after completed treatment for conditions other than malignant neoplasm: Secondary | ICD-10-CM

## 2013-11-14 DIAGNOSIS — Z87891 Personal history of nicotine dependence: Secondary | ICD-10-CM | POA: Insufficient documentation

## 2013-11-14 DIAGNOSIS — I519 Heart disease, unspecified: Secondary | ICD-10-CM | POA: Insufficient documentation

## 2013-11-14 DIAGNOSIS — C569 Malignant neoplasm of unspecified ovary: Secondary | ICD-10-CM

## 2013-11-14 NOTE — Progress Notes (Signed)
Echocardiogram 2D Echocardiogram limited has been performed.  Clois Montavon 11/14/2013, 10:22 AM

## 2013-11-21 ENCOUNTER — Ambulatory Visit: Payer: Medicare Other

## 2013-11-21 ENCOUNTER — Other Ambulatory Visit: Payer: Medicare Other | Admitting: Lab

## 2013-11-21 ENCOUNTER — Telehealth: Payer: Self-pay | Admitting: *Deleted

## 2013-11-21 ENCOUNTER — Ambulatory Visit: Payer: Medicare Other | Admitting: Lab

## 2013-11-21 ENCOUNTER — Other Ambulatory Visit (HOSPITAL_BASED_OUTPATIENT_CLINIC_OR_DEPARTMENT_OTHER): Payer: Medicare Other | Admitting: Lab

## 2013-11-21 ENCOUNTER — Ambulatory Visit (HOSPITAL_BASED_OUTPATIENT_CLINIC_OR_DEPARTMENT_OTHER): Payer: Medicare Other | Admitting: Physician Assistant

## 2013-11-21 ENCOUNTER — Encounter: Payer: Self-pay | Admitting: Physician Assistant

## 2013-11-21 VITALS — BP 136/78 | HR 76 | Temp 97.9°F | Resp 18 | Ht 59.0 in | Wt 120.8 lb

## 2013-11-21 DIAGNOSIS — G589 Mononeuropathy, unspecified: Secondary | ICD-10-CM

## 2013-11-21 DIAGNOSIS — C569 Malignant neoplasm of unspecified ovary: Secondary | ICD-10-CM

## 2013-11-21 DIAGNOSIS — N393 Stress incontinence (female) (male): Secondary | ICD-10-CM

## 2013-11-21 DIAGNOSIS — R0981 Nasal congestion: Secondary | ICD-10-CM

## 2013-11-21 DIAGNOSIS — K1231 Oral mucositis (ulcerative) due to antineoplastic therapy: Secondary | ICD-10-CM

## 2013-11-21 LAB — CBC WITH DIFFERENTIAL/PLATELET
Basophils Absolute: 0.1 10*3/uL (ref 0.0–0.1)
Eosinophils Absolute: 0 10*3/uL (ref 0.0–0.5)
HCT: 31.8 % — ABNORMAL LOW (ref 34.8–46.6)
HGB: 10.3 g/dL — ABNORMAL LOW (ref 11.6–15.9)
MCH: 31.8 pg (ref 25.1–34.0)
MCHC: 32.4 g/dL (ref 31.5–36.0)
MCV: 98.1 fL (ref 79.5–101.0)
MONO#: 0.5 10*3/uL (ref 0.1–0.9)
NEUT%: 58.6 % (ref 38.4–76.8)
Platelets: 164 10*3/uL (ref 145–400)
RDW: 18.6 % — ABNORMAL HIGH (ref 11.2–14.5)
WBC: 4.1 10*3/uL (ref 3.9–10.3)
lymph#: 1.1 10*3/uL (ref 0.9–3.3)

## 2013-11-21 LAB — COMPREHENSIVE METABOLIC PANEL (CC13)
Albumin: 3.5 g/dL (ref 3.5–5.0)
BUN: 16.6 mg/dL (ref 7.0–26.0)
CO2: 25 mEq/L (ref 22–29)
Calcium: 9.7 mg/dL (ref 8.4–10.4)
Chloride: 105 mEq/L (ref 98–109)
Glucose: 92 mg/dl (ref 70–140)
Potassium: 4 mEq/L (ref 3.5–5.1)

## 2013-11-21 MED ORDER — FLUTICASONE PROPIONATE 50 MCG/ACT NA SUSP: 1.0000 | Freq: Every day | NASAL | Status: DC

## 2013-11-21 MED ORDER — ACYCLOVIR 400 MG PO TABS: 400.0000 mg | ORAL_TABLET | Freq: Two times a day (BID) | ORAL | Status: DC

## 2013-11-21 NOTE — Progress Notes (Addendum)
ID: Cassandra Nichols   DOB: [birth year]  MR#: [id number]  WUJ#:[id number]   PCP:  Cassandra Espy, MD GYN:  Cassandra Hipps, MD OTHER:    CHIEF COMPLAINT:  Recurrent Ovarian Cancer   HISTORY OF PRESENT ILLNESS: Cassandra Nichols was initially diagnosed with ovarian cancer in July of 2002. She underwent debulking surgery at that time for what proved to be a 4 of 18 lymph nodes positive disease. She was initially treated with 6 cycles of paclitaxel and carboplatin adjuvantly, completed in December of 2002.  Patient has had multiple recurrences since December of 2002, and has been treated with multiple agents as noted below.  INTERVAL HISTORY: Cassandra Nichols returns today for followup of her ovarian cancer. As a brief recap, she was receiving Nichols every 4 weeks between July of 2013 in May of 2014, with multiple delays due to her work schedule. She then took a break between May and August of 2014, and resumed Nichols on 08/24/2013. The plan is to treat every 3 weeks, but again this has often delayed due to her work schedule. Today is her 5th dose of Nichols since restarting therapy 08/24/2013.   Overall, Cassandra Nichols has tolerated the Nichols well. She tells me today that she "is well". Her only significant side effect at this time is the fact that the skin is peeling on her feet. Fortunately, there is no tenderness or pain whatsoever. The hands have been spared, and there are no additional skin changes elsewhere.   She does have some seasonal allergies for which she is using Cassandra Nichols nasal spray and Cassandra Nichols effectively. Of course she also continues on acyclovir, 400 mg by mouth twice a day to prevent mucositis, and she needs that refilled.  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 She tells me she enjoyed the job, but it was extremely stressful and she is relieved to have a break.  She is traveling to [place], [place] next week to visit her son and her 4 grandchildren. This will be the first opportunity  she has had to attend any of her grandchildren's band concerts, and she is really looking forward to being there.   REVIEW OF SYSTEMS: Cassandra Nichols denies any fevers, chills, night sweats or hot flashes. She denies any  abnormal bruising and has had no abnormal bleeding. She's eating and drinking well with no nausea and no change in bowel or bladder habits.  She does tend to have some constipation for one to 2 days following treatment, but this has been consistent throughout therapy and it resolves on its own. She has mild urinary stress incontinence which is stable. She denies any cough, phlegm production, pleurisy, increased shortness of breath, chest pain, or palpitations. She's had no abnormal headaches, dizziness, or change in vision, and also denies any current myalgias, arthralgias, or bony pain. She has no increased peripheral neuropathy or peripheral swelling.  A detailed review of systems is otherwise stable and noncontributory.    PAST MEDICAL HISTORY: Past Medical History  Diagnosis Date  . Shingles   . Ovarian cancer 11/17/2011  . Cancer     PAST SURGICAL HISTORY: Past Surgical History  Procedure Laterality Date  . Ganglion cyst excision Left     Wrist    FAMILY HISTORY Family History  Problem Relation Age of Onset  . Hypertension Mother   . GER disease Mother   . Cancer Father     Cancer (stomach)    GYNECOLOGIC HISTORY:  SOCIAL HISTORY: (Updated  December 2014) Cassandra Nichols currently lives alone. She works temporary jobs, sometimes as a Lawyer, and sometimes grading tests. Her daughter Cassandra Nichols lives in [place] and is Interior and spatial designer of the epidemiology and genetics Center there; she is married with 2 children. Son  Cassandra Nichols lives in [place], [place]. He has 4 children of his own.   ADVANCED DIRECTIVES: in place  HEALTH MAINTENANCE:  (Updated December 2014) History  Substance Use Topics  . Smoking status: Former Games developer  . Smokeless tobacco: Never Used  . Alcohol Use: No      Comment: Occasionally     Colonoscopy: Not on file  PAP:   Bone density: Not on file  Lipid panel: Not on file  Mammogram:  Feb 2103, [facility]   Allergies  Allergen Reactions  . Penicillins Rash    Current Outpatient Prescriptions  Medication Sig Dispense Refill  . acyclovir (ZOVIRAX) 400 MG tablet Take 1 tablet (400 mg total) by mouth 2 (two) times daily.  60 tablet  5  . Alum & Mag Hydroxide-Simeth (MAGIC MOUTHWASH W/LIDOCAINE) SOLN Take 5 mLs by mouth 4 (four) times daily as needed.  240 mL  2  . lidocaine (XYLOCAINE) 2 % solution TAKE 5 ML BY MOUTH 4 TIMES A DAY AS NEEDED  240 mL  0  . loratadine (Cassandra Nichols) 10 MG tablet Take 10 mg by mouth daily as needed for allergies.      . fluticasone (FLONASE) 50 MCG/ACT nasal spray Place 1 spray into both nostrils daily.  16 g  2  . ibuprofen (ADVIL,MOTRIN) 800 MG tablet Take 800 mg by mouth every 8 (eight) hours as needed.        . Melatonin 3 MG TABS Take 3 mg by mouth at bedtime as needed.      . ondansetron (ZOFRAN) 8 MG tablet 8 mg every 8 (eight) hours as needed for nausea.       . prochlorperazine (COMPAZINE) 10 MG tablet Take 10 mg by mouth every 6 (six) hours as needed.         No current facility-administered medications for this visit.    OBJECTIVE: Middle-aged white woman who appears comfortable and is in no acute distress Filed Vitals:   11/21/13 1027  BP: 136/78  Pulse: 76  Temp: 97.9 F (36.6 C)  Resp: 18     Body mass index is 24.39 kg/(m^2).    ECOG FS: 0 Filed Weights   11/21/13 1027  Weight: 120 lb 12.8 oz (54.795 kg)   Physical Exam: HEENT:  Sclerae anicteric.  Poor dentition.  Oropharynx clear today with no evidence of ulcerations or oral pharyngeal candidiasis.  NODES:  No cervical, supraclavicular, or axillary lymphadenopathy palpated.  BREAST EXAM:  Deferred. LUNGS:  Clear to auscultation bilaterally with good excursion.  No wheezes or rhonchi. HEART:  Regular rate and rhythm. No murmur  appreciated. ABDOMEN:  Soft, nontender. No organomegaly or masses palpated.  Positive bowel sounds.  MSK:  No focal spinal tenderness to palpation. Good range of motion in the upper extremities. No joint swelling. EXTREMITIES:  No peripheral edema.   SKIN:  Significant peeling of the skin on the soles of the feet bilaterally, but no erythema. No tenderness. Skin on the hands is dry with some chronic cracking around the nail beds, stable. NEURO:  Nonfocal. Well oriented.  Positive affect.     LAB RESULTS: Lab Results  Component Value Date   WBC 4.1 11/21/2013   NEUTROABS 2.4 11/21/2013   HGB 10.3* 11/21/2013  HCT 31.8* 11/21/2013   MCV 98.1 11/21/2013   PLT 164 11/21/2013      Chemistry      Component Value Date/Time   NA 138 11/21/2013 1001   NA 140 07/04/2012 0832   K 4.0 11/21/2013 1001   K 4.2 07/04/2012 0832   CL 106 05/09/2013 1304   CL 106 07/04/2012 0832   CO2 25 11/21/2013 1001   CO2 26 07/04/2012 0832   BUN 16.6 11/21/2013 1001   BUN 17 07/04/2012 0832   CREATININE 0.8 11/21/2013 1001   CREATININE 0.82 07/04/2012 0832      Component Value Date/Time   CALCIUM 9.7 11/21/2013 1001   CALCIUM 9.2 07/04/2012 0832   ALKPHOS 84 11/21/2013 1001   ALKPHOS 71 07/04/2012 0832   AST 15 11/21/2013 1001   AST 18 07/04/2012 0832   ALT 7 11/21/2013 1001   ALT 12 07/04/2012 0832   BILITOT 0.45 11/21/2013 1001   BILITOT 0.7 07/04/2012 0832      CA 125  Pending 12/01/2013    3334.9  10/03/2013    2907.6  09/17/2013    1678.5  08/24/2013    1869.2  08/13/2013    1814.8  05/09/2013    STUDIES: Echocardiogram on 11/14/2013 showed a well preserved ejection fraction of 55-60%.  Mammography February 2013 was benign.   ASSESSMENT: 72 year old [place] woman   (1)  with a history of ovarian cancer dating back to July 2002 when she underwent debulking surgery for what proved to be 4/18 lymph node positive disease.   (2)  She received 6 cycles of paclitaxel/carboplatin adjuvantly completed in  December 2002.   (3)  She had her first recurrence in December 2005 treated with paclitaxel;   (4)  second recurrence May 2007 treated with [place] and paclitaxel;   (5)  third recurrence January 2010 treated with topotecan and Gemzar;   (6)  fourth recurrence January 2012 treated with single-agent carboplatin (January to August 2012) with initial response, then plateau; with progression January 2013 (less than 6 months after last carbo dose) treated with single-agent gemzar given Q2 weeks (January to April 2013), with evidence of progression  (7) Nichols strartedJuly 2013, with evidence of response, and fair tolerance.  Treated every 4 weeks. Held between May and August 2014 due to patient's work schedule and scheduling issues.  Resumed treatment as of 08/24/2013, the plan being to treat every 3 weeks, but again often delayed secondary to the patient's schedule.    (8)  as of early December 2014, her total cumulative dose of Nichols reached 560 mg per meter square. There was also an upward trend of the CA 125, and Nichols was discontinued pending additional staging.  PLAN:  Dr. Darnelle Catalan has thoroughly reviewed this case today. He has recommended that we hold Cassandra Nichols from this point forward. Her last CT of the abdomen and pelvis was in September, so we will go ahead and repeat that scan in the next 2 weeks. She will then return to see Dr. Darnelle Catalan per his request on December 19 (60 minutes at 11:00) to review those results and discuss treatment options.   Otherwise, she'll continue on her current medications. I do think I can get her generic fluticasone nasal spray cheaper with a prescription than she is getting the Cassandra Nichols over-the-counter, so I have given her a prescription for that today.   Cassandra Nichols is aware that she is past due for her screening mammogram, and  she plans on scheduling this "  after the first of the year".   Patient voices understanding and agreement with our plan, and as  always knows to call with any changes or problems.    Cassandra Hakes PA-C     11/21/2013    ADDENDUM: Cassandra Nichols has tolerated the Nichols well, but we have given her enough of a cumulative dose that at this point it will be best to change therapy. She does not like "numbers" and so we only give her Global reports, but overall she has been fairly stable on this treatment. There does not seem to have been any cardiotoxicity and we will document that.  Once she is restaged she will see me again and we will consider other options, including paclitaxel. We will have to follow her closely for any development or worsening of neuropathy. There are some work-related issues and so we are not starting treatment until January at the patient's request.  I personally saw this patient and performed a substantive portion of this encounter with the listed APP documented above.   [name], MD

## 2013-11-21 NOTE — Telephone Encounter (Signed)
appts made and printed. Pt is aware that cs will call w/ appts for her CT ABD/PELVIS....TD

## 2013-11-22 LAB — CA 125: CA 125: 3181.2 U/mL — ABNORMAL HIGH (ref 0.0–30.2)

## 2013-12-04 ENCOUNTER — Ambulatory Visit (HOSPITAL_COMMUNITY)
Admission: RE | Admit: 2013-12-04 | Discharge: 2013-12-04 | Disposition: A | Discharge status: Home / Self Care | Payer: Medicare Other | Source: Ambulatory Visit | Attending: Physician Assistant | Admitting: Physician Assistant

## 2013-12-04 ENCOUNTER — Encounter (HOSPITAL_COMMUNITY): Payer: Self-pay

## 2013-12-04 DIAGNOSIS — R918 Other nonspecific abnormal finding of lung field: Secondary | ICD-10-CM | POA: Insufficient documentation

## 2013-12-04 DIAGNOSIS — K838 Other specified diseases of biliary tract: Secondary | ICD-10-CM | POA: Insufficient documentation

## 2013-12-04 DIAGNOSIS — J8410 Pulmonary fibrosis, unspecified: Secondary | ICD-10-CM | POA: Insufficient documentation

## 2013-12-04 DIAGNOSIS — M439 Deforming dorsopathy, unspecified: Secondary | ICD-10-CM | POA: Insufficient documentation

## 2013-12-04 DIAGNOSIS — M948X9 Other specified disorders of cartilage, unspecified sites: Secondary | ICD-10-CM | POA: Insufficient documentation

## 2013-12-04 DIAGNOSIS — C50919 Malignant neoplasm of unspecified site of unspecified female breast: Secondary | ICD-10-CM | POA: Insufficient documentation

## 2013-12-04 DIAGNOSIS — C787 Secondary malignant neoplasm of liver and intrahepatic bile duct: Secondary | ICD-10-CM | POA: Insufficient documentation

## 2013-12-04 DIAGNOSIS — C786 Secondary malignant neoplasm of retroperitoneum and peritoneum: Secondary | ICD-10-CM | POA: Insufficient documentation

## 2013-12-04 DIAGNOSIS — C569 Malignant neoplasm of unspecified ovary: Secondary | ICD-10-CM | POA: Insufficient documentation

## 2013-12-04 MED ORDER — IOHEXOL 300 MG/ML  SOLN
100.0000 mL | Freq: Once | INTRAMUSCULAR | Status: AC | PRN
  Administered 2013-12-04: 100 mL via INTRAVENOUS

## 2013-12-05 ENCOUNTER — Ambulatory Visit (HOSPITAL_COMMUNITY): Payer: Medicare Other

## 2013-12-07 ENCOUNTER — Other Ambulatory Visit (HOSPITAL_BASED_OUTPATIENT_CLINIC_OR_DEPARTMENT_OTHER): Payer: Medicare Other

## 2013-12-07 ENCOUNTER — Ambulatory Visit (HOSPITAL_BASED_OUTPATIENT_CLINIC_OR_DEPARTMENT_OTHER): Payer: Medicare Other | Admitting: Oncology

## 2013-12-07 ENCOUNTER — Telehealth: Payer: Self-pay | Admitting: *Deleted

## 2013-12-07 VITALS — BP 154/81 | HR 87 | Temp 97.6°F | Resp 18 | Ht 59.0 in | Wt 118.5 lb

## 2013-12-07 DIAGNOSIS — C569 Malignant neoplasm of unspecified ovary: Secondary | ICD-10-CM

## 2013-12-07 LAB — CBC WITH DIFFERENTIAL/PLATELET
BASO%: 0.4 % (ref 0.0–2.0)
Eosinophils Absolute: 0 10*3/uL (ref 0.0–0.5)
LYMPH%: 27.5 % (ref 14.0–49.7)
MCH: 32.1 pg (ref 25.1–34.0)
MCHC: 32.6 g/dL (ref 31.5–36.0)
MCV: 98.4 fL (ref 79.5–101.0)
MONO#: 0.5 10*3/uL (ref 0.1–0.9)
MONO%: 8.7 % (ref 0.0–14.0)
NEUT#: 3.3 10*3/uL (ref 1.5–6.5)
Platelets: 146 10*3/uL (ref 145–400)
RBC: 3.8 10*6/uL (ref 3.70–5.45)
RDW: 16.9 % — ABNORMAL HIGH (ref 11.2–14.5)
WBC: 5.3 10*3/uL (ref 3.9–10.3)
lymph#: 1.5 10*3/uL (ref 0.9–3.3)
nRBC: 0 % (ref 0–0)

## 2013-12-07 MED ORDER — AZITHROMYCIN 250 MG PO TABS: ORAL_TABLET | ORAL | Status: DC

## 2013-12-07 MED ORDER — ONDANSETRON HCL 8 MG PO TABS: 8.0000 mg | ORAL_TABLET | Freq: Two times a day (BID) | ORAL | Status: DC

## 2013-12-07 MED ORDER — PROCHLORPERAZINE MALEATE 10 MG PO TABS: 10.0000 mg | ORAL_TABLET | Freq: Four times a day (QID) | ORAL | Status: DC | PRN

## 2013-12-07 NOTE — Telephone Encounter (Signed)
appts made and printed. Pt is aware that tx will be added. i emailed MW to add the tx...td 

## 2013-12-07 NOTE — Progress Notes (Signed)
ID: [name]   DOB: December 11, 1941  MR#: [id number]  ZHY#:[id number]   PCP:  Hollice Espy, MD GYN:  Ronita Hipps, MD OTHER:    CHIEF COMPLAINT:  Recurrent Ovarian Cancer   HISTORY OF PRESENT ILLNESS: Cassandra Nichols was initially diagnosed with ovarian cancer in July of 2002. She underwent debulking surgery at that time for what proved to be a 4 of 18 lymph nodes positive disease. She was initially treated with 6 cycles of paclitaxel and carboplatin adjuvantly, completed in December of 2002.  Patient has had multiple recurrences since December of 2002, and has been treated with multiple agents as noted below.  INTERVAL HISTORY: Cassandra Nichols returns today for followup of her ovarian cancer. She is looking forward to going to [place] to spend Christmas with her son. Are very elderly and in [place] has fallen and she is thinking of possibly going down there for few days as well. Her daughter unfortunately will not be coming for Christmas.-- Cassandra Nichols did well with Doxil, which we stopped in mid November, and since the last visit here has had an echocardiogram which shows a well preserved ejection fraction. Nevertheless we are stopping the Doxil partly because of the high cumulative dose we have reached and partly because there has been a rise in her CA 125.  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 She has a persistent cough, which is rarely productive. She has some sinus symptoms. She denies pleurisy or marked shortness of breath. She had some plantar changes secondary to the Doxil, with skin thickening and peeling. That is improving. She never had significant mouth sores. Otherwise a detailed review of systems today was noncontributory    PAST MEDICAL HISTORY: Past Medical History  Diagnosis Date  . Shingles   . Ovarian cancer 11/17/2011  . Cancer     PAST  SURGICAL HISTORY: Past Surgical History  Procedure Laterality Date  . Ganglion cyst excision Left     Wrist    FAMILY HISTORY Family History  Problem Relation Age of Onset  . Hypertension Mother   . GER disease Mother   . Cancer Father     Cancer (stomach)    GYNECOLOGIC HISTORY: GXP2  SOCIAL HISTORY: (Updated December 2014) Cassandra Nichols currently lives alone. She works temporary jobs, sometimes as a Lawyer, and sometimes grading tests. Her daughter Cassandra Nichols lives in [place] and is Interior and spatial designer of the epidemiology and genetics Center there; she is married with 2 children. Son  Cassandra Nichols lives in [place], [place]. He has 4 children of his own.   ADVANCED DIRECTIVES: in place  HEALTH MAINTENANCE:  (Updated December 2014) History  Substance Use Topics  . Smoking status: Former Games developer  . Smokeless tobacco: Never Used  . Alcohol Use: No     Comment: Occasionally     Colonoscopy: Not on file  PAP:   Bone density: Not on file  Lipid panel: Not on file  Mammogram:  Feb 2103, [facility]   Allergies  Allergen Reactions  . Penicillins Rash    Current Outpatient Prescriptions  Medication Sig Dispense Refill  . acyclovir (ZOVIRAX) 400 MG tablet Take 1 tablet (400 mg total) by mouth 2 (two) times daily.  60 tablet  5  . Alum & Mag Hydroxide-Simeth (MAGIC MOUTHWASH W/LIDOCAINE) SOLN Take 5 mLs by mouth 4 (four) times daily  as needed.  240 mL  2  . azithromycin (ZITHROMAX Z-PAK) 250 MG tablet Take as directed  6 each  0  . fluticasone (FLONASE) 50 MCG/ACT nasal spray Place 1 spray into both nostrils daily.  16 g  2  . ibuprofen (ADVIL,MOTRIN) 800 MG tablet Take 800 mg by mouth every 8 (eight) hours as needed.        . lidocaine (XYLOCAINE) 2 % solution TAKE 5 ML BY MOUTH 4 TIMES A DAY AS NEEDED  240 mL  0  . loratadine (CLARITIN) 10 MG tablet Take 10 mg by mouth daily as needed for allergies.      . Melatonin 3 MG TABS Take 3 mg by mouth at bedtime as needed.      . ondansetron (ZOFRAN) 8 MG  tablet 8 mg every 8 (eight) hours as needed for nausea.       . prochlorperazine (COMPAZINE) 10 MG tablet Take 10 mg by mouth every 6 (six) hours as needed.         No current facility-administered medications for this visit.    OBJECTIVE: Middle-aged white woman in no acute distress Filed Vitals:   12/07/13 1052  BP: 154/81  Pulse: 87  Temp: 97.6 F (36.4 C)  Resp: 18     Body mass index is 23.92 kg/(m^2).    ECOG FS: 1 Filed Weights   12/07/13 1052  Weight: 118 lb 8 oz (53.751 kg)   Physical Exam: HEENT:  Sclerae anicteric.  Poor dentition.  Oropharynx clear NODES:  No cervical, supraclavicular, or axillary lymphadenopathy BREAST EXAM:  Deferred. LUNGS:  Clear to auscultation bilaterally, no wheezes or rales noted HEART:  Regular rate and rhythm. No murmur appreciated. ABDOMEN:  Soft, nontender. No organomegaly or masses palpated.  Positive bowel sounds.  MSK:  No focal spinal tenderness to palpation. No joint swelling. NEURO:  Nonfocal. Well oriented.  Positive affect.   LAB RESULTS: Lab Results  Component Value Date   WBC 5.3 12/07/2013   NEUTROABS 3.3 12/07/2013   HGB 12.2 12/07/2013   HCT 37.4 12/07/2013   MCV 98.4 12/07/2013   PLT 146 12/07/2013      Chemistry      Component Value Date/Time   NA 138 11/21/2013 1001   NA 140 07/04/2012 0832   K 4.0 11/21/2013 1001   K 4.2 07/04/2012 0832   CL 106 05/09/2013 1304   CL 106 07/04/2012 0832   CO2 25 11/21/2013 1001   CO2 26 07/04/2012 0832   BUN 16.6 11/21/2013 1001   BUN 17 07/04/2012 0832   CREATININE 0.8 11/21/2013 1001   CREATININE 0.82 07/04/2012 0832      Component Value Date/Time   CALCIUM 9.7 11/21/2013 1001   CALCIUM 9.2 07/04/2012 0832   ALKPHOS 84 11/21/2013 1001   ALKPHOS 71 07/04/2012 0832   AST 15 11/21/2013 1001   AST 18 07/04/2012 0832   ALT 7 11/21/2013 1001   ALT 12 07/04/2012 0832   BILITOT 0.45 11/21/2013 1001   BILITOT 0.7 07/04/2012 0832     Results for Cassandra, Nichols (MRN [id number]) as of  12/07/2013 10:51  Ref. Range 08/13/2013 08:35 08/24/2013 08:54 09/17/2013 14:47 10/03/2013 12:58 11/21/2013 10:02  CA 125 Latest Range: 0.0-30.2 U/mL 1869.2 (H) 1678.5 (H) 2907.6 (H) 3334.9 (H) 3181.2 (H)   CA 125  3181.2  12/01/2013    3334.9  10/03/2013    2907.6  09/17/2013    1678.5  08/24/2013    1869.2  08/13/2013  1814.8  05/09/2013    STUDIES: Ct Abdomen Pelvis W Contrast  12/04/2013   CLINICAL DATA:  History of ovarian cancer  EXAM: CT ABDOMEN AND PELVIS WITH CONTRAST  TECHNIQUE: Multidetector CT imaging of the abdomen and pelvis was performed using the standard protocol following bolus administration of intravenous contrast.  CONTRAST:  OMNIPAQUE IOHEXOL 300 MG/ML  SOLN  COMPARISON:  08/23/2013  FINDINGS: Evaluation lung bases demonstrates interval development of mixed airspace and interstitial lung disease involving the base of the lingula as well as the lateral and central aspects of the left lower lobe. A focal more consolidative nodular component is identified along the lung base/dome of the diaphragm region measured on image 8 series 2, 13.4 x 9 mm.  The previously described capsular implant along the dome of the liver overlying in segment 7 is only appreciated on the coronal image. When correlated with prior study this finding appears stable. This area is appreciated on image 6 series 602 and correlated with image 15 series 602. This finding measures 12.1 x 9.1 cm in transverse by AP dimensions, previously measuring 12.3 x 9.4 cm. Lesion within segment 4 a appears stable again measuring at 2.5 x 2 cm image 16 series 2. This lesion is nearly Iso attenuated with liver parenchyma on the delayed imaging and hypoattenuated on the early phase imaging. The area of intrahepatic biliary ductal dilatation in segment 4B is minimally more prominent. The previous described low attenuating focus in segment 8 is stable. No new masses or nodules are identified within the liver.  The spleen,  adrenals, pancreas are unremarkable. Extrarenal pelvises identified within the kidneys which otherwise unremarkable. There is no evidence of hydroureter, no hydronephrosis.  Retroperitoneal surgical clips are again identified. Enhancing prominent retroperitoneal lymph nodes are identified. The largest projects within the aorta caval region image 22 measuring 1 cm in short axis. Is there remaining retroperitoneal lymph nodes are sub cm.  The soft tissue nodule within the pelvic sidewall is stable is findings on the left and measures 1.9 x 1.6 cm image 56 series 2 surgical clips again identified within this region. Small nodule 7 by components are again identified. A presacral soft tissue mass identified on the right measuring 2.2 x 1.6 cm image 29 series 2.  There is no evidence of bowel obstruction, abdominal or pelvic free fluid, loculated fluid collections. There is no evidence of abdominal aortic aneurysm. Celiac, SMA, IMA, portal vein are opacified, incidental note again is made of a retro aortic left renal vein. Intrahepatic biliary ductal dilatation is stable.  The osseous findings are unchanged. A stable compression deformity is identified involving L3. Stable sclerotic density is identified within the acetabulum on the left.  IMPRESSION: Stable areas of metastatic disease within the abdomen and pelvis specifically the liver, retroperitoneum regions and lower pelvis.  There has been development of airspace disease within the left lung base with a nodular component along the lower central base of the left lower lobe. Considering patient's history these findings are concerning and further evaluation dedicated chest CT with contrast is recommended.   Electronically Signed   By: Salome Holmes M.D.   On: 12/04/2013 15:17     ASSESSMENT: 72 y.o.  [place] woman   (1)  with a history of ovarian cancer dating back to July 2002 when she underwent debulking surgery for what proved to be 4/18 lymph node  positive disease.   (2)  She received 6 cycles of paclitaxel/carboplatin adjuvantly completed in December 2002.   (  3)  She had her first recurrence in December 2005 treated with paclitaxel;   (4)  second recurrence May 2007 treated with [place] and paclitaxel;   (5)  third recurrence January 2010 treated with topotecan and Gemzar;   (6)  fourth recurrence January 2012 treated with single-agent carboplatin (January to August 2012) with initial response, then plateau; with progression January 2013 (less than 6 months after last carbo dose) treated with single-agent gemzar given Q2 weeks (January to April 2013), with evidence of progression  (7) Doxil strartedJuly 2013, with evidence of initial response, and fair tolerance. Held between May and August 2014 due to patient's work schedule and scheduling issues.  Resumed treatment as of 08/24/2013, with some delays secondary to the patient's schedule. As of early December 2014, her total cumulative dose of Doxil reached 560 mg per meter square. In addition there was also an upward trend of the CA 125. Doxil was discontinued (last dose 10/30/2013). Echocardiogram on 11/14/2013 showed a well preserved ejection fraction of 55-60%.  (8) paclitaxel days 1 and 8 of each 21 day cycle to start 12/27/2012  PLAN:  Cassandra Nichols tolerated the Doxil relatively well, and specifically there is no evidence of cardiac damage. Nevertheless we are moving off that medication, partly due to the high cumulative dose, but also because of the rise in her CA 125.  CT scans as noted above are essentially stable except in the lungs. What we are seeing there may be infectious, and I am starting her on a Z-Pak. Hopefully that will clear her symptoms (chiefly cough) before we are ready to start the Taxol treatment January 8.  She has had Taxol before so she is aware of the possible toxicities, side effects and complications. She has grade 1 peripheral neuropathy. We will have to watch  that carefully. The initial plan is to do 4 doses of Taxol (2 cycles) and follow the CA 125.  As usual her job situation complicates treatment. She's not scheduled to start working until January 26, so the first 2 doses are not a problem. The next 2 doses probably will need to be given on the late afternoon Friday appointment. We will operationalize that when we get closer to that time, as her work schedule changes frequently.  Cassandra Nichols has a good understanding of the overall plan, and agrees with it. She knows the goal of treatment is control. She will calls with any problems that may develop before her next visit here. Cassandra Dell, MD     12/07/2013

## 2013-12-07 NOTE — Telephone Encounter (Signed)
Per staff message and POF I have scheduled appts. Appt on 1/8 with MD to late ain the afternoon to start first time treatment. Scheduler notified JMW

## 2013-12-10 ENCOUNTER — Other Ambulatory Visit: Payer: Medicare Other

## 2013-12-10 ENCOUNTER — Telehealth: Payer: Self-pay | Admitting: *Deleted

## 2013-12-10 ENCOUNTER — Ambulatory Visit: Payer: Medicare Other

## 2013-12-10 ENCOUNTER — Ambulatory Visit: Payer: Medicare Other | Admitting: Physician Assistant

## 2013-12-10 NOTE — Telephone Encounter (Signed)
Per staff message and POF I have scheduled appts.  JMW  

## 2013-12-10 NOTE — Telephone Encounter (Signed)
sw pt about the time she wanted to arrive on 12/27/13 made her arrive that its to late for her to start her new tx. gv appt d/t for 12/27/13 w/labs@ 11:15am and ov@ 11:45. Pt is aware...td

## 2013-12-11 ENCOUNTER — Telehealth: Payer: Self-pay | Admitting: *Deleted

## 2013-12-11 ENCOUNTER — Telehealth: Payer: Self-pay | Admitting: Oncology

## 2013-12-11 NOTE — Telephone Encounter (Signed)
Cassandra Nichols called to this RN to state overall improvement in lung status since starting Z pak but noted " like it wants to relapse " episodes of coughing with phlegm production in the late afternoon.  [name] wanted to inquire if she needed an extension on the Zpak.  She also states she has been called in to work on the 14th and there fore needs to change her treatment dates. Discussed dates pt is available.  This RN recommended need to use Muccinex DM due to need to loosen up congestion per above description. Discussed need to drink a lot of fluid with the muccinex for it to work best.  Pt understands to call if needed, no other concerns at this time.

## 2013-12-24 ENCOUNTER — Other Ambulatory Visit (HOSPITAL_BASED_OUTPATIENT_CLINIC_OR_DEPARTMENT_OTHER): Payer: Medicare Other

## 2013-12-24 ENCOUNTER — Encounter (INDEPENDENT_AMBULATORY_CARE_PROVIDER_SITE_OTHER): Payer: Self-pay

## 2013-12-24 ENCOUNTER — Ambulatory Visit (HOSPITAL_BASED_OUTPATIENT_CLINIC_OR_DEPARTMENT_OTHER): Payer: Medicare Other

## 2013-12-24 ENCOUNTER — Ambulatory Visit (HOSPITAL_BASED_OUTPATIENT_CLINIC_OR_DEPARTMENT_OTHER): Payer: Medicare Other | Admitting: Oncology

## 2013-12-24 ENCOUNTER — Telehealth: Payer: Self-pay | Admitting: *Deleted

## 2013-12-24 VITALS — BP 159/76 | HR 70 | Temp 97.9°F | Resp 18 | Ht 59.0 in | Wt 120.2 lb

## 2013-12-24 DIAGNOSIS — C569 Malignant neoplasm of unspecified ovary: Secondary | ICD-10-CM

## 2013-12-24 DIAGNOSIS — Z5111 Encounter for antineoplastic chemotherapy: Secondary | ICD-10-CM

## 2013-12-24 DIAGNOSIS — C779 Secondary and unspecified malignant neoplasm of lymph node, unspecified: Secondary | ICD-10-CM

## 2013-12-24 LAB — COMPREHENSIVE METABOLIC PANEL (CC13)
ALT: 16 U/L (ref 0–55)
ANION GAP: 12 meq/L — AB (ref 3–11)
AST: 19 U/L (ref 5–34)
Albumin: 3.7 g/dL (ref 3.5–5.0)
Alkaline Phosphatase: 86 U/L (ref 40–150)
BUN: 18.6 mg/dL (ref 7.0–26.0)
CO2: 22 meq/L (ref 22–29)
CREATININE: 0.8 mg/dL (ref 0.6–1.1)
Calcium: 9.4 mg/dL (ref 8.4–10.4)
Chloride: 105 mEq/L (ref 98–109)
Glucose: 90 mg/dl (ref 70–140)
Potassium: 4.1 mEq/L (ref 3.5–5.1)
SODIUM: 138 meq/L (ref 136–145)
TOTAL PROTEIN: 6.9 g/dL (ref 6.4–8.3)
Total Bilirubin: 0.57 mg/dL (ref 0.20–1.20)

## 2013-12-24 LAB — CBC WITH DIFFERENTIAL/PLATELET
BASO%: 0.4 % (ref 0.0–2.0)
Basophils Absolute: 0 10*3/uL (ref 0.0–0.1)
EOS ABS: 0 10*3/uL (ref 0.0–0.5)
EOS%: 0.8 % (ref 0.0–7.0)
HCT: 35.3 % (ref 34.8–46.6)
HEMOGLOBIN: 11.6 g/dL (ref 11.6–15.9)
LYMPH%: 29.4 % (ref 14.0–49.7)
MCH: 32.3 pg (ref 25.1–34.0)
MCHC: 32.9 g/dL (ref 31.5–36.0)
MCV: 98.3 fL (ref 79.5–101.0)
MONO#: 0.3 10*3/uL (ref 0.1–0.9)
MONO%: 6.7 % (ref 0.0–14.0)
NEUT%: 62.7 % (ref 38.4–76.8)
NEUTROS ABS: 3.1 10*3/uL (ref 1.5–6.5)
NRBC: 0 % (ref 0–0)
PLATELETS: 126 10*3/uL — AB (ref 145–400)
RBC: 3.59 10*6/uL — AB (ref 3.70–5.45)
RDW: 15.5 % — ABNORMAL HIGH (ref 11.2–14.5)
WBC: 4.9 10*3/uL (ref 3.9–10.3)
lymph#: 1.5 10*3/uL (ref 0.9–3.3)

## 2013-12-24 MED ORDER — SODIUM CHLORIDE 0.9 % IV SOLN
80.0000 mg/m2 | Freq: Once | INTRAVENOUS | Status: AC
  Administered 2013-12-24: 120 mg via INTRAVENOUS
  Filled 2013-12-24: qty 20

## 2013-12-24 MED ORDER — FAMOTIDINE IN NACL 20-0.9 MG/50ML-% IV SOLN
20.0000 mg | Freq: Once | INTRAVENOUS | Status: AC
  Administered 2013-12-24: 20 mg via INTRAVENOUS

## 2013-12-24 MED ORDER — SODIUM CHLORIDE 0.9 % IJ SOLN
10.0000 mL | INTRAMUSCULAR | Status: DC | PRN
  Administered 2013-12-24: 10 mL
  Filled 2013-12-24: qty 10

## 2013-12-24 MED ORDER — SODIUM CHLORIDE 0.9 % IV SOLN
Freq: Once | INTRAVENOUS | Status: AC
  Administered 2013-12-24: 14:00:00 via INTRAVENOUS

## 2013-12-24 MED ORDER — HEPARIN SOD (PORK) LOCK FLUSH 100 UNIT/ML IV SOLN
500.0000 [IU] | Freq: Once | INTRAVENOUS | Status: AC | PRN
  Administered 2013-12-24: 500 [IU]
  Filled 2013-12-24: qty 5

## 2013-12-24 MED ORDER — DIPHENHYDRAMINE HCL 50 MG/ML IJ SOLN
INTRAMUSCULAR | Status: AC
  Filled 2013-12-24: qty 1

## 2013-12-24 MED ORDER — DEXAMETHASONE SODIUM PHOSPHATE 20 MG/5ML IJ SOLN
INTRAMUSCULAR | Status: AC
  Filled 2013-12-24: qty 5

## 2013-12-24 MED ORDER — DIPHENHYDRAMINE HCL 50 MG/ML IJ SOLN
25.0000 mg | Freq: Once | INTRAMUSCULAR | Status: AC
  Administered 2013-12-24: 25 mg via INTRAVENOUS

## 2013-12-24 MED ORDER — ONDANSETRON 8 MG/NS 50 ML IVPB
INTRAVENOUS | Status: AC
  Filled 2013-12-24: qty 8

## 2013-12-24 MED ORDER — ONDANSETRON 8 MG/50ML IVPB (CHCC)
8.0000 mg | Freq: Once | INTRAVENOUS | Status: AC
  Administered 2013-12-24: 8 mg via INTRAVENOUS

## 2013-12-24 MED ORDER — DEXAMETHASONE SODIUM PHOSPHATE 20 MG/5ML IJ SOLN
20.0000 mg | Freq: Once | INTRAMUSCULAR | Status: AC
  Administered 2013-12-24: 20 mg via INTRAVENOUS

## 2013-12-24 MED ORDER — FAMOTIDINE IN NACL 20-0.9 MG/50ML-% IV SOLN
INTRAVENOUS | Status: AC
  Filled 2013-12-24: qty 50

## 2013-12-24 NOTE — Progress Notes (Signed)
ID: Cassandra Nichols   DOB: 1941-01-03  MR#: [id number]  [id number]   PCP:  Marjorie Smolder, MD GYN:  [name], MD OTHER:    CHIEF COMPLAINT:  Recurrent Ovarian Cancer   HISTORY OF PRESENT ILLNESS: Cassandra Nichols was initially diagnosed with ovarian cancer in July of 2002. She underwent debulking surgery at that time for what proved to be a 4 of 18 lymph nodes positive disease. She was initially treated with 6 cycles of paclitaxel and carboplatin adjuvantly, completed in December of 2002.  Patient has had multiple recurrences since December of 2002, and has been treated with multiple agents as noted below.  INTERVAL HISTORY: Cassandra Nichols returns today for followup of her ovarian cancer. She greatly enjoyed the holidays, which she spent in [place] with her son and his family. The day after she spoke with her daughter in [place], with whom she is somewhat estranged. Overall things went rather well for her. She is looking forward to resuming work this month.   REVIEW OF SYSTEMS: Cassandra Nichols denies unusual headaches, visual changes, cough, phlegm production, pleurisy, shortness of breath, chest pain or pressure, palpitations, or change in bowel or bladder habits. His been no abdominal swelling, cramping, or masses. A detailed review of systems today was otherwise entirely stable   PAST MEDICAL HISTORY: Past Medical History  Diagnosis Date  . Shingles   . Ovarian cancer 11/17/2011  . Cancer     PAST SURGICAL HISTORY: Past Surgical History  Procedure Laterality Date  . Ganglion cyst excision Left     Wrist    FAMILY HISTORY Family History  Problem Relation Age of Onset  . Hypertension Mother   . GER disease Mother   . Cancer Father     Cancer (stomach)    GYNECOLOGIC HISTORY: GXP2  SOCIAL HISTORY: (Updated December 2014) Cassandra Nichols lives alone. She works temporary jobs, sometimes as a Oceanographer, and sometimes grading tests. Her daughter Cassandra Nichols lives in [place] and is Mudlogger of  the epidemiology and [employer] there; she is married with 2 children. Son  Cassandra Nichols lives in [place], [place]. He has 4 children of his own.His wife is a Marine scientist  ADVANCED DIRECTIVES: in place  HEALTH MAINTENANCE:  (Updated December 2014) History  Substance Use Topics  . Smoking status: Former Research scientist (life sciences)  . Smokeless tobacco: Never Used  . Alcohol Use: No     Comment: Occasionally     Colonoscopy: Not on file  PAP:   Bone density: Not on file  Lipid panel: Not on file  Mammogram:  Feb 2103, [facility]   Allergies  Allergen Reactions  . Penicillins Rash    Current Outpatient Prescriptions  Medication Sig Dispense Refill  . acyclovir (ZOVIRAX) 400 MG tablet Take 1 tablet (400 mg total) by mouth 2 (two) times daily.  60 tablet  5  . Alum & Mag Hydroxide-Simeth (MAGIC MOUTHWASH W/LIDOCAINE) SOLN Take 5 mLs by mouth 4 (four) times daily as needed.  240 mL  2  . azithromycin (ZITHROMAX Z-PAK) 250 MG tablet Take as directed  6 each  0  . fluticasone (FLONASE) 50 MCG/ACT nasal spray Place 1 spray into both nostrils daily.  16 g  2  . ibuprofen (ADVIL,MOTRIN) 800 MG tablet Take 800 mg by mouth every 8 (eight) hours as needed.        . lidocaine (XYLOCAINE) 2 % solution TAKE 5 ML BY MOUTH 4 TIMES A DAY AS NEEDED  240 mL  0  . loratadine (CLARITIN) 10  MG tablet Take 10 mg by mouth daily as needed for allergies.      . Melatonin 3 MG TABS Take 3 mg by mouth at bedtime as needed.      . ondansetron (ZOFRAN) 8 MG tablet 8 mg every 8 (eight) hours as needed for nausea.       . ondansetron (ZOFRAN) 8 MG tablet Take 1 tablet (8 mg total) by mouth 2 (two) times daily. Take two times a day starting the day after chemo for 2 days. Then take two times a day as needed for nausea or vomiting.  30 tablet  1  . prochlorperazine (COMPAZINE) 10 MG tablet Take 10 mg by mouth every 6 (six) hours as needed.        . prochlorperazine (COMPAZINE) 10 MG tablet Take 1 tablet (10 mg total) by mouth every 6 (six) hours as  needed (Nausea or vomiting).  30 tablet  1   No current facility-administered medications for this visit.   Facility-Administered Medications Ordered in Other Visits  Medication Dose Route Frequency Provider Last Rate Last Dose  . heparin lock flush 100 unit/mL  500 Units Intracatheter Once PRN Chauncey Cruel, MD      . PACLitaxel (TAXOL) 120 mg in sodium chloride 0.9 % 250 mL chemo infusion (</= 80mg /m2)  80 mg/m2 (Treatment Plan Actual) Intravenous Once Chauncey Cruel, MD 270 mL/hr at 12/24/13 1443 120 mg at 12/24/13 1443  . sodium chloride 0.9 % injection 10 mL  10 mL Intracatheter PRN Chauncey Cruel, MD        OBJECTIVE: Middle-aged white woman examined in the treatment area Filed Vitals:   12/24/13 1300  BP: 159/76  Pulse: 70  Temp: 97.9 F (36.6 C)  Resp: 18     Body mass index is 24.26 kg/(m^2).    ECOG FS: 1 Filed Weights   12/24/13 1300  Weight: 120 lb 3.2 oz (54.522 kg)   Sclerae unicteric, pupils equal and round Oropharynx clear and moist-- no thrush No cervical or supraclavicular adenopathy Lungs no rales or rhonchi Heart regular rate and rhythm Abd soft, nontender, positive bowel sounds, no masses palpated MSK no upper extremity lymphedema Neuro: nonfocal, well oriented, appropriate affect Breasts: Deferred    LAB RESULTS: Lab Results  Component Value Date   WBC 4.9 12/24/2013   NEUTROABS 3.1 12/24/2013   HGB 11.6 12/24/2013   HCT 35.3 12/24/2013   MCV 98.3 12/24/2013   PLT 126* 12/24/2013      Chemistry      Component Value Date/Time   NA 138 12/24/2013 1237   NA 140 07/04/2012 0832   K 4.1 12/24/2013 1237   K 4.2 07/04/2012 0832   CL 106 05/09/2013 1304   CL 106 07/04/2012 0832   CO2 22 12/24/2013 1237   CO2 26 07/04/2012 0832   BUN 18.6 12/24/2013 1237   BUN 17 07/04/2012 0832   CREATININE 0.8 12/24/2013 1237   CREATININE 0.82 07/04/2012 0832      Component Value Date/Time   CALCIUM 9.4 12/24/2013 1237   CALCIUM 9.2 07/04/2012 0832   ALKPHOS 86 12/24/2013 1237    ALKPHOS 71 07/04/2012 0832   AST 19 12/24/2013 1237   AST 18 07/04/2012 0832   ALT 16 12/24/2013 1237   ALT 12 07/04/2012 0832   BILITOT 0.57 12/24/2013 1237   BILITOT 0.7 07/04/2012 0832     Results for Cassandra, Nichols (MRN [id number]) as of 12/07/2013 10:51  Ref. Range 08/13/2013 08:35 08/24/2013  08:54 09/17/2013 14:47 10/03/2013 12:58 11/21/2013 10:02  CA 125 Latest Range: 0.0-30.2 U/mL 1869.2 (H) 1678.5 (H) 2907.6 (H) 3334.9 (H) 3181.2 (H)   CA 125  3181.2  11/21/2013    3334.9  10/03/2013    2907.6  09/17/2013    1678.5  08/24/2013    1869.2  08/13/2013    1814.8  05/09/2013    STUDIES: Ct Abdomen Pelvis W Contrast  12/04/2013   CLINICAL DATA:  History of ovarian cancer  EXAM: CT ABDOMEN AND PELVIS WITH CONTRAST  TECHNIQUE: Multidetector CT imaging of the abdomen and pelvis was performed using the standard protocol following bolus administration of intravenous contrast.  CONTRAST:  166mL OMNIPAQUE IOHEXOL 300 MG/ML  SOLN  COMPARISON:  08/23/2013  FINDINGS: Evaluation lung bases demonstrates interval development of mixed airspace and interstitial lung disease involving the base of the lingula as well as the lateral and central aspects of the left lower lobe. A focal more consolidative nodular component is identified along the lung base/dome of the diaphragm region measured on image 8 series 2, 13.4 x 9 mm.  The previously described capsular implant along the dome of the liver overlying in segment 7 is only appreciated on the coronal image. When correlated with prior study this finding appears stable. This area is appreciated on image 6 series 602 and correlated with image 15 series 602. This finding measures 12.1 x 9.1 cm in transverse by AP dimensions, previously measuring 12.3 x 9.4 cm. Lesion within segment 4 a appears stable again measuring at 2.5 x 2 cm image 16 series 2. This lesion is nearly Iso attenuated with liver parenchyma on the delayed imaging and hypoattenuated on the early phase imaging.  The area of intrahepatic biliary ductal dilatation in segment 4B is minimally more prominent. The previous described low attenuating focus in segment 8 is stable. No new masses or nodules are identified within the liver.  The spleen, adrenals, pancreas are unremarkable. Extrarenal pelvises identified within the kidneys which otherwise unremarkable. There is no evidence of hydroureter, no hydronephrosis.  Retroperitoneal surgical clips are again identified. Enhancing prominent retroperitoneal lymph nodes are identified. The largest projects within the aorta caval region image 22 measuring 1 cm in short axis. Is there remaining retroperitoneal lymph nodes are sub cm.  The soft tissue nodule within the pelvic sidewall is stable is findings on the left and measures 1.9 x 1.6 cm image 56 series 2 surgical clips again identified within this region. Small nodule 7 by components are again identified. A presacral soft tissue mass identified on the right measuring 2.2 x 1.6 cm image 29 series 2.  There is no evidence of bowel obstruction, abdominal or pelvic free fluid, loculated fluid collections. There is no evidence of abdominal aortic aneurysm. Celiac, SMA, IMA, portal vein are opacified, incidental note again is made of a retro aortic left renal vein. Intrahepatic biliary ductal dilatation is stable.  The osseous findings are unchanged. A stable compression deformity is identified involving L3. Stable sclerotic density is identified within the acetabulum on the left.  IMPRESSION: Stable areas of metastatic disease within the abdomen and pelvis specifically the liver, retroperitoneum regions and lower pelvis.  There has been development of airspace disease within the left lung base with a nodular component along the lower central base of the left lower lobe. Considering patient's history these findings are concerning and further evaluation dedicated chest CT with contrast is recommended.   Electronically Signed   By:  Margaree Mackintosh M.D.  On: 12/04/2013 15:17     ASSESSMENT: 73 y.o.  [place] woman   (1)  with a history of ovarian cancer dating back to July 2002 when she underwent debulking surgery for what proved to be 4/18 lymph node positive disease.   (2)  She received 6 cycles of paclitaxel/carboplatin adjuvantly completed in December 2002.   (3)  She had her first recurrence in December 2005 treated with paclitaxel;   (4)  second recurrence May 2007 treated with [place] and paclitaxel;   (5)  third recurrence January 2010 treated with topotecan and Gemzar;   (6)  fourth recurrence January 2012 treated with single-agent carboplatin (January to August 2012) with initial response, then plateau; with progression January 2013 (less than 6 months after last carbo dose) treated with single-agent gemzar given Q2 weeks (January to April 2013), with evidence of progression  (7) Doxil strartedJuly 2013, with evidence of initial response, and fair tolerance. Held between May and August 2014 due to patient's work schedule and scheduling issues.  Resumed treatment as of 08/24/2013, with some delays secondary to the patient's schedule. As of early December 2014, her total cumulative dose of Doxil reached 560 mg per meter square. In addition there was also an upward trend of the CA 125. Doxil was discontinued (last dose 10/30/2013). Echocardiogram on 11/14/2013 showed a well preserved ejection fraction of 55-60%.  (8) paclitaxel days 1 and 8 of each 21 day cycle to start 12/27/2012  PLAN:  Cassandra Nichols is starting her paclitaxel day 1, day 8 every 21 day treatments today. She has a good understanding of the possible toxicities, side effects and complications of treatment. She has no baseline peripheral neuropathy. She is going to see [place] again in a week for the day 8 treatment, and at that point she will tell us when she wants to receive a second cycle--there are problems related to her work that may make it necessary for  her to get treated on Friday afternoons.  She knows to call for any problems that may develop before her next visit here.  NOTE: The patient is very insistent that she "does not want to know numbers". We do not discuss the specifics of her CA 125 or other results with her, at her request. Instead we keep at at a very Gen. level ("things are stable", "things are getting better", but we need to make a change in treatment").  Chauncey Cruel, MD     12/24/2013

## 2013-12-24 NOTE — Patient Instructions (Signed)
[facility] Discharge Instructions for Patients Receiving Chemotherapy  Today you received the following chemotherapy agents: Taxol.  To help prevent nausea and vomiting after your treatment, we encourage you to take your nausea medication as prescribed.   If you develop nausea and vomiting that is not controlled by your nausea medication, call the clinic.   BELOW ARE SYMPTOMS THAT SHOULD BE REPORTED IMMEDIATELY:  *FEVER GREATER THAN 100.5 F  *CHILLS WITH OR WITHOUT FEVER  NAUSEA AND VOMITING THAT IS NOT CONTROLLED WITH YOUR NAUSEA MEDICATION  *UNUSUAL SHORTNESS OF BREATH  *UNUSUAL BRUISING OR BLEEDING  TENDERNESS IN MOUTH AND THROAT WITH OR WITHOUT PRESENCE OF ULCERS  *URINARY PROBLEMS  *BOWEL PROBLEMS  UNUSUAL RASH Items with * indicate a potential emergency and should be followed up as soon as possible.  Feel free to call the clinic you have any questions or concerns. The clinic phone number is [phone].    

## 2013-12-24 NOTE — Telephone Encounter (Signed)
appts made and printed...td 

## 2013-12-25 ENCOUNTER — Telehealth: Payer: Self-pay | Admitting: *Deleted

## 2013-12-25 LAB — CA 125: CA 125: 1772.3 U/mL — ABNORMAL HIGH (ref 0.0–30.2)

## 2013-12-25 NOTE — Telephone Encounter (Signed)
Message copied by Cherylynn Ridges on Tue Dec 25, 2013  1:11 PM ------      Message from: Christa See      Created: Mon Dec 24, 2013  2:34 PM      Regarding: Chemo f/u Call      Contact: [phone]       First time taxol (since 2007) on Monday. 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 Can you make sure patient is doing okay? Thanks! Kristen  ------

## 2013-12-25 NOTE — Telephone Encounter (Signed)
Called Cassandra Nichols at [phone] number(s).  Messge left requesting a return call for chemotherapy follow up.  Awaiting return call from patient.

## 2013-12-27 ENCOUNTER — Other Ambulatory Visit: Payer: Medicare Other

## 2013-12-27 ENCOUNTER — Ambulatory Visit: Payer: Medicare Other

## 2013-12-27 ENCOUNTER — Ambulatory Visit: Payer: Medicare Other | Admitting: Physician Assistant

## 2013-12-31 ENCOUNTER — Encounter (INDEPENDENT_AMBULATORY_CARE_PROVIDER_SITE_OTHER): Payer: Self-pay

## 2013-12-31 ENCOUNTER — Ambulatory Visit (HOSPITAL_BASED_OUTPATIENT_CLINIC_OR_DEPARTMENT_OTHER): Payer: Medicare Other | Admitting: Hematology and Oncology

## 2013-12-31 ENCOUNTER — Other Ambulatory Visit (HOSPITAL_BASED_OUTPATIENT_CLINIC_OR_DEPARTMENT_OTHER): Payer: Medicare Other

## 2013-12-31 ENCOUNTER — Ambulatory Visit (HOSPITAL_BASED_OUTPATIENT_CLINIC_OR_DEPARTMENT_OTHER): Payer: Medicare Other

## 2013-12-31 VITALS — BP 146/80 | HR 72 | Temp 98.1°F | Resp 20 | Ht 59.0 in | Wt 121.4 lb

## 2013-12-31 DIAGNOSIS — Z5111 Encounter for antineoplastic chemotherapy: Secondary | ICD-10-CM

## 2013-12-31 DIAGNOSIS — C569 Malignant neoplasm of unspecified ovary: Secondary | ICD-10-CM

## 2013-12-31 DIAGNOSIS — D61810 Antineoplastic chemotherapy induced pancytopenia: Secondary | ICD-10-CM

## 2013-12-31 DIAGNOSIS — T451X5A Adverse effect of antineoplastic and immunosuppressive drugs, initial encounter: Secondary | ICD-10-CM

## 2013-12-31 LAB — CBC WITH DIFFERENTIAL/PLATELET
BASO%: 0.3 % (ref 0.0–2.0)
Basophils Absolute: 0 10*3/uL (ref 0.0–0.1)
EOS ABS: 0.1 10*3/uL (ref 0.0–0.5)
EOS%: 1.4 % (ref 0.0–7.0)
HEMATOCRIT: 32.8 % — AB (ref 34.8–46.6)
HGB: 10.8 g/dL — ABNORMAL LOW (ref 11.6–15.9)
LYMPH%: 39.6 % (ref 14.0–49.7)
MCH: 32 pg (ref 25.1–34.0)
MCHC: 32.9 g/dL (ref 31.5–36.0)
MCV: 97 fL (ref 79.5–101.0)
MONO#: 0.2 10*3/uL (ref 0.1–0.9)
MONO%: 6.4 % (ref 0.0–14.0)
NEUT#: 1.9 10*3/uL (ref 1.5–6.5)
NEUT%: 52.3 % (ref 38.4–76.8)
PLATELETS: 109 10*3/uL — AB (ref 145–400)
RBC: 3.38 10*6/uL — AB (ref 3.70–5.45)
RDW: 15.1 % — ABNORMAL HIGH (ref 11.2–14.5)
WBC: 3.6 10*3/uL — AB (ref 3.9–10.3)
lymph#: 1.4 10*3/uL (ref 0.9–3.3)

## 2013-12-31 MED ORDER — ONDANSETRON 8 MG/50ML IVPB (CHCC)
8.0000 mg | Freq: Once | INTRAVENOUS | Status: AC
  Administered 2013-12-31: 8 mg via INTRAVENOUS

## 2013-12-31 MED ORDER — FAMOTIDINE IN NACL 20-0.9 MG/50ML-% IV SOLN
20.0000 mg | Freq: Once | INTRAVENOUS | Status: AC
  Administered 2013-12-31: 20 mg via INTRAVENOUS

## 2013-12-31 MED ORDER — SODIUM CHLORIDE 0.9 % IJ SOLN
10.0000 mL | INTRAMUSCULAR | Status: DC | PRN
  Administered 2013-12-31: 10 mL
  Filled 2013-12-31: qty 10

## 2013-12-31 MED ORDER — DEXAMETHASONE SODIUM PHOSPHATE 20 MG/5ML IJ SOLN
20.0000 mg | Freq: Once | INTRAMUSCULAR | Status: AC
  Administered 2013-12-31: 20 mg via INTRAVENOUS

## 2013-12-31 MED ORDER — SODIUM CHLORIDE 0.9 % IV SOLN
Freq: Once | INTRAVENOUS | Status: AC
  Administered 2013-12-31: 17:00:00 via INTRAVENOUS

## 2013-12-31 MED ORDER — PACLITAXEL CHEMO INJECTION 300 MG/50ML
80.0000 mg/m2 | Freq: Once | INTRAVENOUS | Status: AC
  Administered 2013-12-31: 120 mg via INTRAVENOUS
  Filled 2013-12-31: qty 20

## 2013-12-31 MED ORDER — FAMOTIDINE IN NACL 20-0.9 MG/50ML-% IV SOLN
INTRAVENOUS | Status: AC
  Filled 2013-12-31: qty 50

## 2013-12-31 MED ORDER — DIPHENHYDRAMINE HCL 50 MG/ML IJ SOLN
INTRAMUSCULAR | Status: AC
  Filled 2013-12-31: qty 1

## 2013-12-31 MED ORDER — DEXAMETHASONE SODIUM PHOSPHATE 20 MG/5ML IJ SOLN
INTRAMUSCULAR | Status: AC
  Filled 2013-12-31: qty 5

## 2013-12-31 MED ORDER — HEPARIN SOD (PORK) LOCK FLUSH 100 UNIT/ML IV SOLN
500.0000 [IU] | Freq: Once | INTRAVENOUS | Status: AC | PRN
  Administered 2013-12-31: 500 [IU]
  Filled 2013-12-31: qty 5

## 2013-12-31 MED ORDER — DIPHENHYDRAMINE HCL 50 MG/ML IJ SOLN
25.0000 mg | Freq: Once | INTRAMUSCULAR | Status: AC
  Administered 2013-12-31: 25 mg via INTRAVENOUS

## 2013-12-31 NOTE — Patient Instructions (Signed)
[facility] Discharge Instructions for Patients Receiving Chemotherapy  Today you received the following chemotherapy agent: Taxol   To help prevent nausea and vomiting after your treatment, we encourage you to take your nausea medication as prescribed.    If you develop nausea and vomiting that is not controlled by your nausea medication, call the clinic.   BELOW ARE SYMPTOMS THAT SHOULD BE REPORTED IMMEDIATELY:  *FEVER GREATER THAN 100.5 F  *CHILLS WITH OR WITHOUT FEVER  NAUSEA AND VOMITING THAT IS NOT CONTROLLED WITH YOUR NAUSEA MEDICATION  *UNUSUAL SHORTNESS OF BREATH  *UNUSUAL BRUISING OR BLEEDING  TENDERNESS IN MOUTH AND THROAT WITH OR WITHOUT PRESENCE OF ULCERS  *URINARY PROBLEMS  *BOWEL PROBLEMS  UNUSUAL RASH Items with * indicate a potential emergency and should be followed up as soon as possible.  Feel free to call the clinic you have any questions or concerns. The clinic phone number is [phone].    

## 2013-12-31 NOTE — Progress Notes (Signed)
ID: Cassandra Nichols   DOB: 1941/04/28  MR#: [id number]  [id number]   PCP:  Marjorie Smolder, MD GYN:  [name], MD OTHER:    CHIEF COMPLAINT:  Recurrent Ovarian Cancer  Chemotherapy regimen: Paclitaxel day 1 day 8 to be repeated every 21 day cycle   HISTORY OF PRESENT ILLNESS: Cassandra Nichols was initially diagnosed with ovarian cancer in July of 2002. She underwent debulking surgery at that time for what proved to be a 4 of 18 lymph nodes positive disease. She was initially treated with 6 cycles of paclitaxel and carboplatin adjuvantly, completed in December of 2002.  Patient has had multiple recurrences since December of 2002, and has been treated with multiple agents as noted below.  INTERVAL HISTORY: Cassandra Nichols returns today for followup of her ovarian cancer. She tolerated cycle 1 week 1 chemotherapy with paclitaxel well. She has intermittent tingling and numbness. She does meditation and yoga that helps her a lot in relaxation. She denies any shortness of breath, chest pain. complaints of occasional  Tiredness.   REVIEW OF SYSTEMS: Cassandra Nichols denies unusual headaches, visual changes, cough, phlegm production, pleurisy, shortness of breath, chest pain or pressure, palpitations, or change in bowel or bladder habits. His been no abdominal swelling, cramping, or masses. All 14 review of systems today was otherwise entirely stable   PAST MEDICAL HISTORY: Past Medical History  Diagnosis Date  . Shingles   . Ovarian cancer 11/17/2011  . Cancer     PAST SURGICAL HISTORY: Past Surgical History  Procedure Laterality Date  . Ganglion cyst excision Left     Wrist    FAMILY HISTORY Family History  Problem Relation Age of Onset  . Hypertension Mother   . GER disease Mother   . Cancer Father     Cancer (stomach)    GYNECOLOGIC HISTORY: GXP2  SOCIAL HISTORY: (Updated December 2014) Cassandra Nichols lives alone. She works temporary jobs, sometimes as a Oceanographer, and sometimes grading  tests. Her daughter Cassandra Nichols lives in [place] and is Mudlogger of the epidemiology and [place] there; she is married with 2 children. Son  Cassandra Nichols lives in [place], [place]. He has 4 children of his own.His wife is a Marine scientist  ADVANCED DIRECTIVES: in place  HEALTH MAINTENANCE:  (Updated December 2014) History  Substance Use Topics  . Smoking status: Former Research scientist (life sciences)  . Smokeless tobacco: Never Used  . Alcohol Use: No     Comment: Occasionally     Colonoscopy: Not on file  PAP:   Bone density: Not on file  Lipid panel: Not on file  Mammogram:  Feb 2103, [facility]   Allergies  Allergen Reactions  . Penicillins Rash    Current Outpatient Prescriptions  Medication Sig Dispense Refill  . ondansetron (ZOFRAN) 8 MG tablet 8 mg every 8 (eight) hours as needed for nausea.       . Alum & Mag Hydroxide-Simeth (MAGIC MOUTHWASH W/LIDOCAINE) SOLN Take 5 mLs by mouth 4 (four) times daily as needed.  240 mL  2  . fluticasone (FLONASE) 50 MCG/ACT nasal spray Place 1 spray into both nostrils daily.  16 g  2  . ibuprofen (ADVIL,MOTRIN) 800 MG tablet Take 800 mg by mouth every 8 (eight) hours as needed.        . lidocaine (XYLOCAINE) 2 % solution TAKE 5 ML BY MOUTH 4 TIMES A DAY AS NEEDED  240 mL  0  . loratadine (CLARITIN) 10 MG tablet Take 10 mg by mouth daily as needed  for allergies.      . Melatonin 3 MG TABS Take 3 mg by mouth at bedtime as needed.      . ondansetron (ZOFRAN) 8 MG tablet Take 1 tablet (8 mg total) by mouth 2 (two) times daily. Take two times a day starting the day after chemo for 2 days. Then take two times a day as needed for nausea or vomiting.  30 tablet  1  . prochlorperazine (COMPAZINE) 10 MG tablet Take 10 mg by mouth every 6 (six) hours as needed.        . prochlorperazine (COMPAZINE) 10 MG tablet Take 1 tablet (10 mg total) by mouth every 6 (six) hours as needed (Nausea or vomiting).  30 tablet  1   No current facility-administered medications for this visit.    OBJECTIVE:  Middle-aged white woman examined in the treatment area Filed Vitals:   12/31/13 1535  BP: 146/80  Pulse: 72  Temp: 98.1 F (36.7 C)  Resp: 20     Body mass index is 24.51 kg/(m^2).    ECOG FS: 1 Filed Weights   12/31/13 1535  Weight: 121 lb 6.4 oz (55.067 kg)   Sclerae unicteric, pupils equal and round Oropharynx clear and moist-- no thrush No cervical or supraclavicular adenopathy Lungs no rales or rhonchi Heart regular rate and rhythm Abd soft, nontender, positive bowel sounds, no masses palpated MSK no upper extremity lymphedema Neuro: nonfocal, well oriented, appropriate affect Breasts: Deferred    LAB RESULTS: Lab Results  Component Value Date   WBC 3.6* 12/31/2013   NEUTROABS 1.9 12/31/2013   HGB 10.8* 12/31/2013   HCT 32.8* 12/31/2013   MCV 97.0 12/31/2013   PLT 109* 12/31/2013      Chemistry      Component Value Date/Time   NA 138 12/24/2013 1237   NA 140 07/04/2012 0832   K 4.1 12/24/2013 1237   K 4.2 07/04/2012 0832   CL 106 05/09/2013 1304   CL 106 07/04/2012 0832   CO2 22 12/24/2013 1237   CO2 26 07/04/2012 0832   BUN 18.6 12/24/2013 1237   BUN 17 07/04/2012 0832   CREATININE 0.8 12/24/2013 1237   CREATININE 0.82 07/04/2012 0832      Component Value Date/Time   CALCIUM 9.4 12/24/2013 1237   CALCIUM 9.2 07/04/2012 0832   ALKPHOS 86 12/24/2013 1237   ALKPHOS 71 07/04/2012 0832   AST 19 12/24/2013 1237   AST 18 07/04/2012 0832   ALT 16 12/24/2013 1237   ALT 12 07/04/2012 0832   BILITOT 0.57 12/24/2013 1237   BILITOT 0.7 07/04/2012 0832     Results for Cassandra Nichols, Cassandra Nichols (MRN [id number]) as of 12/07/2013 10:51  Ref. Range 08/13/2013 08:35 08/24/2013 08:54 09/17/2013 14:47 10/03/2013 12:58 11/21/2013 10:02  CA 125 Latest Range: 0.0-30.2 U/mL 1869.2 (H) 1678.5 (H) 2907.6 (H) 3334.9 (H) 3181.2 (H)   CA 125  3181.2  11/21/2013    3334.9  10/03/2013    2907.6  09/17/2013    1678.5  08/24/2013    1869.2  08/13/2013    1814.8  05/09/2013    STUDIES: Ct Abdomen Pelvis W  Contrast  12/04/2013   CLINICAL DATA:  History of ovarian cancer  EXAM: CT ABDOMEN AND PELVIS WITH CONTRAST  TECHNIQUE: Multidetector CT imaging of the abdomen and pelvis was performed using the standard protocol following bolus administration of intravenous contrast.  CONTRAST:  176mL OMNIPAQUE IOHEXOL 300 MG/ML  SOLN  COMPARISON:  08/23/2013  FINDINGS: Evaluation lung bases demonstrates  interval development of mixed airspace and interstitial lung disease involving the base of the lingula as well as the lateral and central aspects of the left lower lobe. A focal more consolidative nodular component is identified along the lung base/dome of the diaphragm region measured on image 8 series 2, 13.4 x 9 mm.  The previously described capsular implant along the dome of the liver overlying in segment 7 is only appreciated on the coronal image. When correlated with prior study this finding appears stable. This area is appreciated on image 6 series 602 and correlated with image 15 series 602. This finding measures 12.1 x 9.1 cm in transverse by AP dimensions, previously measuring 12.3 x 9.4 cm. Lesion within segment 4 a appears stable again measuring at 2.5 x 2 cm image 16 series 2. This lesion is nearly Iso attenuated with liver parenchyma on the delayed imaging and hypoattenuated on the early phase imaging. The area of intrahepatic biliary ductal dilatation in segment 4B is minimally more prominent. The previous described low attenuating focus in segment 8 is stable. No new masses or nodules are identified within the liver.  The spleen, adrenals, pancreas are unremarkable. Extrarenal pelvises identified within the kidneys which otherwise unremarkable. There is no evidence of hydroureter, no hydronephrosis.  Retroperitoneal surgical clips are again identified. Enhancing prominent retroperitoneal lymph nodes are identified. The largest projects within the aorta caval region image 22 measuring 1 cm in short axis. Is there  remaining retroperitoneal lymph nodes are sub cm.  The soft tissue nodule within the pelvic sidewall is stable is findings on the left and measures 1.9 x 1.6 cm image 56 series 2 surgical clips again identified within this region. Small nodule 7 by components are again identified. A presacral soft tissue mass identified on the right measuring 2.2 x 1.6 cm image 29 series 2.  There is no evidence of bowel obstruction, abdominal or pelvic free fluid, loculated fluid collections. There is no evidence of abdominal aortic aneurysm. Celiac, SMA, IMA, portal vein are opacified, incidental note again is made of a retro aortic left renal vein. Intrahepatic biliary ductal dilatation is stable.  The osseous findings are unchanged. A stable compression deformity is identified involving L3. Stable sclerotic density is identified within the acetabulum on the left.  IMPRESSION: Stable areas of metastatic disease within the abdomen and pelvis specifically the liver, retroperitoneum regions and lower pelvis.  There has been development of airspace disease within the left lung base with a nodular component along the lower central base of the left lower lobe. Considering patient's history these findings are concerning and further evaluation dedicated chest CT with contrast is recommended.   Electronically Signed   By: Margaree Mackintosh M.D.   On: 12/04/2013 15:17     ASSESSMENT: 73 y.o.  Old lady (1)  with a history of ovarian cancer dating back to July 2002 when she underwent debulking surgery for what proved to be 4/18 lymph node positive disease.   (2)  She received 6 cycles of paclitaxel/carboplatin adjuvantly completed in December 2002.   (3)  She had her first recurrence in December 2005 treated with paclitaxel;   (4)  second recurrence May 2007 treated with [place] and paclitaxel;   (5)  third recurrence January 2010 treated with topotecan and Gemzar;   (6)  fourth recurrence January 2012 treated with single-agent  carboplatin (January to August 2012) with initial response, then plateau; with progression January 2013 (less than 6 months after last carbo dose) treated with  single-agent gemzar given Q2 weeks (January to April 2013), with evidence of progression  (7) Doxil strartedJuly 2013 6, with evidence of initial response, and fair tolerance. Held between May and August 2014 due to patient's work schedule and scheduling issues.  Resumed treatment as of 08/24/2013, with some delays secondary to the patient's schedule. As of early December 2014, her total cumulative dose of Doxil reached 560 mg per meter square. In addition there was also an upward trend of the CA 125. Doxil was discontinued (last dose 10/30/2013). Echocardiogram on 11/14/2013 showed a well preserved ejection fraction of 55-60%.  (8) paclitaxel: Cycle 1 day 8 of each 21 day cycle to to be initiated today. (9). Pancytopenia secondary to chemotherapy-induced: We'll closely monitor CBC. If her [place] goes down we'll decrease the dose of paclitaxel by 25% dose reduction with the next chemotherapy in conjunction with growth factor support  PLAN:  1.Continue cycle 1 day 8 of chemotherapy with Taxol today. Her tumor marker CA 125 decreased from 3181 on 11/21/2013 to 1772 on 12/24/2012 2.CBC and differential, CMP prior to next chemotherapy. 3.If her [place] is below 1500 with the next labs, consider decreasing the dose of Taxol by 25% 4.Since patient is starting her work and she says she  does not have time to have the bloodwork in one week, I have asked the patient if any fever /bleeding or any health issues to call our office and come back sooner for follow up. 5.She knows to call for any problems that may develop before her next visit here. 6. Repeat CT of the chest abdomen and pelvis after 3 cycles of chemotherapy 6.Followup in 2 weeks for initiation of cycle 2 day 1 of chemotherapy with paclitaxel  NOTE: The patient is very insistent that she "does not  want to know numbers". We do not discuss the specifics of her CA 125 or other results with her, at her request. Instead we keep at at a very Gen. level ("things are stable", "things are getting better", but we need to make a change in treatment").  Wilmon Arms, MD     12/31/2013

## 2014-01-01 ENCOUNTER — Ambulatory Visit: Payer: Medicare Other | Admitting: Oncology

## 2014-01-01 ENCOUNTER — Other Ambulatory Visit: Payer: Medicare Other

## 2014-01-01 ENCOUNTER — Ambulatory Visit: Payer: Medicare Other

## 2014-01-03 ENCOUNTER — Ambulatory Visit: Payer: Medicare Other

## 2014-01-03 ENCOUNTER — Ambulatory Visit: Payer: Medicare Other | Admitting: Physician Assistant

## 2014-01-03 ENCOUNTER — Other Ambulatory Visit: Payer: Medicare Other

## 2014-01-18 ENCOUNTER — Other Ambulatory Visit (HOSPITAL_BASED_OUTPATIENT_CLINIC_OR_DEPARTMENT_OTHER): Payer: Medicare Other

## 2014-01-18 ENCOUNTER — Ambulatory Visit (HOSPITAL_BASED_OUTPATIENT_CLINIC_OR_DEPARTMENT_OTHER): Payer: Medicare Other

## 2014-01-18 ENCOUNTER — Ambulatory Visit: Payer: Medicare Other

## 2014-01-18 ENCOUNTER — Other Ambulatory Visit: Payer: Self-pay | Admitting: Hematology and Oncology

## 2014-01-18 VITALS — BP 164/94 | HR 80 | Temp 97.8°F | Resp 20

## 2014-01-18 DIAGNOSIS — Z5111 Encounter for antineoplastic chemotherapy: Secondary | ICD-10-CM

## 2014-01-18 DIAGNOSIS — C569 Malignant neoplasm of unspecified ovary: Secondary | ICD-10-CM

## 2014-01-18 LAB — CBC WITH DIFFERENTIAL/PLATELET
BASO%: 0.8 % (ref 0.0–2.0)
BASOS ABS: 0 10*3/uL (ref 0.0–0.1)
EOS ABS: 0 10*3/uL (ref 0.0–0.5)
EOS%: 0.7 % (ref 0.0–7.0)
HEMATOCRIT: 35.6 % (ref 34.8–46.6)
HGB: 11.7 g/dL (ref 11.6–15.9)
LYMPH%: 38.5 % (ref 14.0–49.7)
MCH: 32.1 pg (ref 25.1–34.0)
MCHC: 32.8 g/dL (ref 31.5–36.0)
MCV: 97.8 fL (ref 79.5–101.0)
MONO#: 0.4 10*3/uL (ref 0.1–0.9)
MONO%: 10.7 % (ref 0.0–14.0)
NEUT%: 49.3 % (ref 38.4–76.8)
NEUTROS ABS: 1.7 10*3/uL (ref 1.5–6.5)
Platelets: 156 10*3/uL (ref 145–400)
RBC: 3.64 10*6/uL — ABNORMAL LOW (ref 3.70–5.45)
RDW: 16.1 % — ABNORMAL HIGH (ref 11.2–14.5)
WBC: 3.5 10*3/uL — AB (ref 3.9–10.3)
lymph#: 1.4 10*3/uL (ref 0.9–3.3)

## 2014-01-18 LAB — COMPREHENSIVE METABOLIC PANEL (CC13)
ALT: 11 U/L (ref 0–55)
ANION GAP: 10 meq/L (ref 3–11)
AST: 16 U/L (ref 5–34)
Albumin: 3.6 g/dL (ref 3.5–5.0)
Alkaline Phosphatase: 91 U/L (ref 40–150)
BILIRUBIN TOTAL: 0.3 mg/dL (ref 0.20–1.20)
BUN: 15.2 mg/dL (ref 7.0–26.0)
CALCIUM: 9.6 mg/dL (ref 8.4–10.4)
CHLORIDE: 106 meq/L (ref 98–109)
CO2: 27 meq/L (ref 22–29)
Creatinine: 0.8 mg/dL (ref 0.6–1.1)
Glucose: 98 mg/dl (ref 70–140)
Potassium: 3.9 mEq/L (ref 3.5–5.1)
Sodium: 142 mEq/L (ref 136–145)
TOTAL PROTEIN: 6.7 g/dL (ref 6.4–8.3)

## 2014-01-18 MED ORDER — PACLITAXEL CHEMO INJECTION 300 MG/50ML
80.0000 mg/m2 | Freq: Once | INTRAVENOUS | Status: AC
  Administered 2014-01-18: 120 mg via INTRAVENOUS
  Filled 2014-01-18: qty 20

## 2014-01-18 MED ORDER — HEPARIN SOD (PORK) LOCK FLUSH 100 UNIT/ML IV SOLN
500.0000 [IU] | Freq: Once | INTRAVENOUS | Status: AC | PRN
  Administered 2014-01-18: 500 [IU]
  Filled 2014-01-18: qty 5

## 2014-01-18 MED ORDER — FAMOTIDINE IN NACL 20-0.9 MG/50ML-% IV SOLN
20.0000 mg | Freq: Once | INTRAVENOUS | Status: AC
  Administered 2014-01-18: 20 mg via INTRAVENOUS

## 2014-01-18 MED ORDER — DIPHENHYDRAMINE HCL 50 MG/ML IJ SOLN
25.0000 mg | Freq: Once | INTRAMUSCULAR | Status: AC
  Administered 2014-01-18: 25 mg via INTRAVENOUS

## 2014-01-18 MED ORDER — ONDANSETRON 8 MG/50ML IVPB (CHCC)
8.0000 mg | Freq: Once | INTRAVENOUS | Status: AC
  Administered 2014-01-18: 8 mg via INTRAVENOUS

## 2014-01-18 MED ORDER — DEXAMETHASONE SODIUM PHOSPHATE 20 MG/5ML IJ SOLN
20.0000 mg | Freq: Once | INTRAMUSCULAR | Status: AC
  Administered 2014-01-18: 20 mg via INTRAVENOUS

## 2014-01-18 MED ORDER — DEXAMETHASONE SODIUM PHOSPHATE 20 MG/5ML IJ SOLN
INTRAMUSCULAR | Status: AC
  Filled 2014-01-18: qty 5

## 2014-01-18 MED ORDER — SODIUM CHLORIDE 0.9 % IV SOLN
Freq: Once | INTRAVENOUS | Status: AC
  Administered 2014-01-18: 16:00:00 via INTRAVENOUS

## 2014-01-18 MED ORDER — SODIUM CHLORIDE 0.9 % IJ SOLN
10.0000 mL | INTRAMUSCULAR | Status: DC | PRN
  Administered 2014-01-18: 10 mL
  Filled 2014-01-18: qty 10

## 2014-01-18 MED ORDER — DIPHENHYDRAMINE HCL 50 MG/ML IJ SOLN
INTRAMUSCULAR | Status: AC
  Filled 2014-01-18: qty 1

## 2014-01-18 MED ORDER — FAMOTIDINE IN NACL 20-0.9 MG/50ML-% IV SOLN
INTRAVENOUS | Status: AC
  Filled 2014-01-18: qty 50

## 2014-01-18 NOTE — Patient Instructions (Addendum)
[facility] Discharge Instructions for Patients Receiving Chemotherapy  Today you received the following chemotherapy agents: Taxol.  To help prevent nausea and vomiting after your treatment, we encourage you to take your nausea medication as prescribed.   If you develop nausea and vomiting that is not controlled by your nausea medication, call the clinic.   BELOW ARE SYMPTOMS THAT SHOULD BE REPORTED IMMEDIATELY:  *FEVER GREATER THAN 100.5 F  *CHILLS WITH OR WITHOUT FEVER  NAUSEA AND VOMITING THAT IS NOT CONTROLLED WITH YOUR NAUSEA MEDICATION  *UNUSUAL SHORTNESS OF BREATH  *UNUSUAL BRUISING OR BLEEDING  TENDERNESS IN MOUTH AND THROAT WITH OR WITHOUT PRESENCE OF ULCERS  *URINARY PROBLEMS  *BOWEL PROBLEMS  UNUSUAL RASH Items with * indicate a potential emergency and should be followed up as soon as possible.  Feel free to call the clinic you have any questions or concerns. The clinic phone number is [phone].    

## 2014-01-19 LAB — CA 125: CA 125: 1212 U/mL — ABNORMAL HIGH (ref 0.0–30.2)

## 2014-01-25 ENCOUNTER — Other Ambulatory Visit: Payer: Self-pay | Admitting: Hematology and Oncology

## 2014-01-25 ENCOUNTER — Ambulatory Visit: Payer: Medicare Other

## 2014-01-25 ENCOUNTER — Other Ambulatory Visit (HOSPITAL_BASED_OUTPATIENT_CLINIC_OR_DEPARTMENT_OTHER): Payer: Medicare Other

## 2014-01-25 ENCOUNTER — Ambulatory Visit (HOSPITAL_BASED_OUTPATIENT_CLINIC_OR_DEPARTMENT_OTHER): Payer: Medicare Other

## 2014-01-25 DIAGNOSIS — Z5111 Encounter for antineoplastic chemotherapy: Secondary | ICD-10-CM

## 2014-01-25 DIAGNOSIS — C569 Malignant neoplasm of unspecified ovary: Secondary | ICD-10-CM

## 2014-01-25 DIAGNOSIS — C779 Secondary and unspecified malignant neoplasm of lymph node, unspecified: Secondary | ICD-10-CM

## 2014-01-25 LAB — CBC WITH DIFFERENTIAL/PLATELET
BASO%: 1 % (ref 0.0–2.0)
Basophils Absolute: 0 10*3/uL (ref 0.0–0.1)
EOS%: 0.9 % (ref 0.0–7.0)
Eosinophils Absolute: 0 10*3/uL (ref 0.0–0.5)
HCT: 33.3 % — ABNORMAL LOW (ref 34.8–46.6)
HGB: 11 g/dL — ABNORMAL LOW (ref 11.6–15.9)
LYMPH#: 1.3 10*3/uL (ref 0.9–3.3)
LYMPH%: 38.1 % (ref 14.0–49.7)
MCH: 32.1 pg (ref 25.1–34.0)
MCHC: 33.1 g/dL (ref 31.5–36.0)
MCV: 96.8 fL (ref 79.5–101.0)
MONO#: 0.3 10*3/uL (ref 0.1–0.9)
MONO%: 9 % (ref 0.0–14.0)
NEUT#: 1.8 10*3/uL (ref 1.5–6.5)
NEUT%: 51 % (ref 38.4–76.8)
Platelets: 132 10*3/uL — ABNORMAL LOW (ref 145–400)
RBC: 3.44 10*6/uL — ABNORMAL LOW (ref 3.70–5.45)
RDW: 15.7 % — AB (ref 11.2–14.5)
WBC: 3.5 10*3/uL — ABNORMAL LOW (ref 3.9–10.3)

## 2014-01-25 MED ORDER — SODIUM CHLORIDE 0.9 % IJ SOLN
10.0000 mL | INTRAMUSCULAR | Status: DC | PRN
  Administered 2014-01-25: 10 mL
  Filled 2014-01-25: qty 10

## 2014-01-25 MED ORDER — ONDANSETRON 8 MG/50ML IVPB (CHCC)
8.0000 mg | Freq: Once | INTRAVENOUS | Status: AC
  Administered 2014-01-25: 8 mg via INTRAVENOUS

## 2014-01-25 MED ORDER — SODIUM CHLORIDE 0.9 % IV SOLN
80.0000 mg/m2 | Freq: Once | INTRAVENOUS | Status: AC
  Administered 2014-01-25: 120 mg via INTRAVENOUS
  Filled 2014-01-25: qty 20

## 2014-01-25 MED ORDER — FAMOTIDINE IN NACL 20-0.9 MG/50ML-% IV SOLN
INTRAVENOUS | Status: AC
  Filled 2014-01-25: qty 50

## 2014-01-25 MED ORDER — HEPARIN SOD (PORK) LOCK FLUSH 100 UNIT/ML IV SOLN
500.0000 [IU] | Freq: Once | INTRAVENOUS | Status: AC | PRN
  Administered 2014-01-25: 500 [IU]
  Filled 2014-01-25: qty 5

## 2014-01-25 MED ORDER — FAMOTIDINE IN NACL 20-0.9 MG/50ML-% IV SOLN
20.0000 mg | Freq: Once | INTRAVENOUS | Status: AC
  Administered 2014-01-25: 20 mg via INTRAVENOUS

## 2014-01-25 MED ORDER — DIPHENHYDRAMINE HCL 50 MG/ML IJ SOLN
25.0000 mg | Freq: Once | INTRAMUSCULAR | Status: AC
  Administered 2014-01-25: 25 mg via INTRAVENOUS

## 2014-01-25 MED ORDER — DEXAMETHASONE SODIUM PHOSPHATE 20 MG/5ML IJ SOLN
20.0000 mg | Freq: Once | INTRAMUSCULAR | Status: AC
  Administered 2014-01-25: 20 mg via INTRAVENOUS

## 2014-01-25 MED ORDER — DIPHENHYDRAMINE HCL 50 MG/ML IJ SOLN
INTRAMUSCULAR | Status: AC
  Filled 2014-01-25: qty 1

## 2014-01-25 MED ORDER — DEXAMETHASONE SODIUM PHOSPHATE 20 MG/5ML IJ SOLN
INTRAMUSCULAR | Status: AC
  Filled 2014-01-25: qty 5

## 2014-01-25 MED ORDER — SODIUM CHLORIDE 0.9 % IV SOLN
Freq: Once | INTRAVENOUS | Status: AC
Start: 2014-01-25 — End: 2014-01-25
  Administered 2014-01-25: 16:00:00 via INTRAVENOUS

## 2014-01-25 NOTE — Patient Instructions (Addendum)
[facility] Discharge Instructions for Patients Receiving Chemotherapy  Today you received the following chemotherapy agents: taxol  To help prevent nausea and vomiting after your treatment, we encourage you to take your nausea medication.  Take it as often as prescribed.     If you develop nausea and vomiting that is not controlled by your nausea medication, call the clinic. If it is after clinic hours your family physician or the after hours number for the clinic or go to the Emergency Department.   BELOW ARE SYMPTOMS THAT SHOULD BE REPORTED IMMEDIATELY:  *FEVER GREATER THAN 100.5 F  *CHILLS WITH OR WITHOUT FEVER  NAUSEA AND VOMITING THAT IS NOT CONTROLLED WITH YOUR NAUSEA MEDICATION  *UNUSUAL SHORTNESS OF BREATH  *UNUSUAL BRUISING OR BLEEDING  TENDERNESS IN MOUTH AND THROAT WITH OR WITHOUT PRESENCE OF ULCERS  *URINARY PROBLEMS  *BOWEL PROBLEMS  UNUSUAL RASH Items with * indicate a potential emergency and should be followed up as soon as possible.  Feel free to call the clinic you have any questions or concerns. The clinic phone number is [phone].   I have been informed and understand all the instructions given to me. I know to contact the clinic, my physician, or go to the Emergency Department if any problems should occur. I do not have any questions at this time, but understand that I may call the clinic during office hours   should I have any questions or need assistance in obtaining follow up care.    __________________________________________  _____________  __________ Signature of Patient or Authorized Representative            Date                   Time    __________________________________________ Nurse's Signature    

## 2014-01-30 ENCOUNTER — Other Ambulatory Visit: Payer: Self-pay | Admitting: *Deleted

## 2014-01-30 ENCOUNTER — Telehealth: Payer: Self-pay | Admitting: *Deleted

## 2014-01-30 ENCOUNTER — Telehealth: Payer: Self-pay | Admitting: Oncology

## 2014-01-30 NOTE — Telephone Encounter (Signed)
lmonvm adviisng the pt of her feb appt and advised to pick up the rest of her schedules at that time.

## 2014-01-30 NOTE — Telephone Encounter (Signed)
Per staff message and POF I have scheduled appts.  JMW  

## 2014-02-08 ENCOUNTER — Other Ambulatory Visit (HOSPITAL_BASED_OUTPATIENT_CLINIC_OR_DEPARTMENT_OTHER): Payer: Medicare Other

## 2014-02-08 ENCOUNTER — Ambulatory Visit (HOSPITAL_BASED_OUTPATIENT_CLINIC_OR_DEPARTMENT_OTHER): Payer: Medicare Other | Admitting: Hematology and Oncology

## 2014-02-08 ENCOUNTER — Ambulatory Visit: Payer: Medicare Other

## 2014-02-08 ENCOUNTER — Telehealth: Payer: Self-pay | Admitting: *Deleted

## 2014-02-08 VITALS — BP 151/83 | HR 83 | Temp 98.0°F | Resp 20 | Ht 59.0 in | Wt 126.0 lb

## 2014-02-08 DIAGNOSIS — C779 Secondary and unspecified malignant neoplasm of lymph node, unspecified: Secondary | ICD-10-CM

## 2014-02-08 DIAGNOSIS — C569 Malignant neoplasm of unspecified ovary: Secondary | ICD-10-CM

## 2014-02-08 LAB — CBC WITH DIFFERENTIAL/PLATELET
BASO%: 0.6 % (ref 0.0–2.0)
BASOS ABS: 0 10*3/uL (ref 0.0–0.1)
EOS ABS: 0 10*3/uL (ref 0.0–0.5)
EOS%: 0.4 % (ref 0.0–7.0)
HCT: 34.5 % — ABNORMAL LOW (ref 34.8–46.6)
HEMOGLOBIN: 11.3 g/dL — AB (ref 11.6–15.9)
LYMPH#: 1.2 10*3/uL (ref 0.9–3.3)
LYMPH%: 31.6 % (ref 14.0–49.7)
MCH: 31.4 pg (ref 25.1–34.0)
MCHC: 32.7 g/dL (ref 31.5–36.0)
MCV: 95.9 fL (ref 79.5–101.0)
MONO#: 0.5 10*3/uL (ref 0.1–0.9)
MONO%: 13.8 % (ref 0.0–14.0)
NEUT%: 53.6 % (ref 38.4–76.8)
NEUTROS ABS: 2 10*3/uL (ref 1.5–6.5)
PLATELETS: 159 10*3/uL (ref 145–400)
RBC: 3.6 10*6/uL — ABNORMAL LOW (ref 3.70–5.45)
RDW: 16.1 % — ABNORMAL HIGH (ref 11.2–14.5)
WBC: 3.7 10*3/uL — AB (ref 3.9–10.3)

## 2014-02-08 NOTE — Telephone Encounter (Signed)
Per staff message and POF I have scheduled appts.  JMW  

## 2014-02-08 NOTE — Progress Notes (Signed)
ID: Cassandra Nichols   DOB: 1941/08/18  MR#: [id number]  WUJ#:[id number]   PCP:  Cassandra Smolder, MD GYN:  Cassandra Laroche, MD OTHER:    CHIEF COMPLAINT:  Recurrent Ovarian Cancer  Chemotherapy regimen: Paclitaxel day 1 day 8 to be repeated every 21 day cycle   HISTORY OF PRESENT ILLNESS: Cassandra Nichols was initially diagnosed with ovarian cancer in July of 2002. She underwent debulking surgery at that time for what proved to be a 4 of 18 lymph nodes positive disease. She was initially treated with 6 cycles of paclitaxel and carboplatin adjuvantly, completed in December of 2002.  Patient has had multiple recurrences since December of 2002, and has been treated with multiple agents as noted below.  INTERVAL HISTORY: Cassandra Nichols returns today for followup of her ovarian cancer and initiation of cycle #3 day #1 chemotherapy with Taxol. She is tolerating paclitaxel chemotherapy quite well . She has intermittent tingling and numbness. She does meditation and yoga that helps her a lot in relaxation. She denies any shortness of breath, chest pain. complaints of occasional  minimal fatigue   REVIEW OF SYSTEMS: Cassandra Nichols denies unusual headaches, visual changes, cough, phlegm production, pleurisy, shortness of breath, chest pain or pressure, palpitations, or change in bowel or bladder habits. His been no abdominal swelling, cramping, or masses. A 10 point review of systems today was otherwise entirely stable   PAST MEDICAL HISTORY: Past Medical History  Diagnosis Date  . Shingles   . Ovarian cancer 11/17/2011  . Cancer     PAST SURGICAL HISTORY: Past Surgical History  Procedure Laterality Date  . Ganglion cyst excision Left     Wrist    FAMILY HISTORY Family History  Problem Relation Age of Onset  . Hypertension Mother   . GER disease Mother   . Cancer Father     Cancer (stomach)    GYNECOLOGIC HISTORY: GXP2  SOCIAL HISTORY: (Updated December 2014) Cassandra Nichols lives alone. She works temporary jobs,  sometimes as a Oceanographer, and sometimes grading tests. Her daughter Cassandra Nichols lives in [place] and is Cassandra Nichols of the epidemiology and [place] there; she is married with 2 children. Son  Cassandra Nichols lives in [place], [place]. He has 4 children of his own.His wife is a Marine scientist  ADVANCED DIRECTIVES: in place  HEALTH MAINTENANCE:  (Updated December 2014) History  Substance Use Topics  . Smoking status: Former Research scientist (life sciences)  . Smokeless tobacco: Never Used  . Alcohol Use: No     Comment: Occasionally     Colonoscopy: Not on file  PAP:   Bone density: Not on file  Lipid panel: Not on file  Mammogram:  Feb 2103, [facility]   Allergies  Allergen Reactions  . Penicillins Rash    Current Outpatient Prescriptions  Medication Sig Dispense Refill  . fluticasone (FLONASE) 50 MCG/ACT nasal spray Place 1 spray into both nostrils daily.  16 g  2  . loratadine (CLARITIN) 10 MG tablet Take 10 mg by mouth daily as needed for allergies.      . Melatonin 3 MG TABS Take 3 mg by mouth at bedtime as needed.      Marland Kitchen acyclovir (ZOVIRAX) 400 MG tablet       . Alum & Mag Hydroxide-Simeth (MAGIC MOUTHWASH W/LIDOCAINE) SOLN Take 5 mLs by mouth 4 (four) times daily as needed.  240 mL  2  . ibuprofen (ADVIL,MOTRIN) 800 MG tablet Take 800 mg by mouth every 8 (eight) hours as needed.        Marland Kitchen  ondansetron (ZOFRAN) 8 MG tablet Take 1 tablet (8 mg total) by mouth 2 (two) times daily. Take two times a day starting the day after chemo for 2 days. Then take two times a day as needed for nausea or vomiting.  30 tablet  1  . prochlorperazine (COMPAZINE) 10 MG tablet Take 1 tablet (10 mg total) by mouth every 6 (six) hours as needed (Nausea or vomiting).  30 tablet  1   No current facility-administered medications for this visit.    OBJECTIVE: Middle-aged white woman examined in the treatment area Filed Vitals:   02/08/14 1146  BP: 151/83  Pulse: 83  Temp: 98 F (36.7 C)  Resp: 20     Body mass index is 25.44 kg/(m^2).     ECOG FS: 1 Filed Weights   02/08/14 1146  Weight: 126 lb (57.153 kg)   HEENT PERRLA, sclerae anicteric, conjunctiva no palpable neck supple no JVD no thyromegaly ,patient is not in acute distress  Oropharynx clear and moist-- no thrush No cervical or supraclavicular adenopathy Lungs no rales or rhonchi Heart regular rate and rhythm Abd soft, nontender, positive bowel sounds, no masses palpated MSK no upper extremity lymphedema Neuro: nonfocal, well oriented, appropriate affect Breasts: Deferred Extremities no edema no stenosis no clubbing Psychological: Normal mood and affect    LAB RESULTS: Lab Results  Component Value Date   WBC 3.7* 02/08/2014   NEUTROABS 2.0 02/08/2014   HGB 11.3* 02/08/2014   HCT 34.5* 02/08/2014   MCV 95.9 02/08/2014   PLT 159 02/08/2014      Chemistry      Component Value Date/Time   NA 142 01/18/2014 1549   NA 140 07/04/2012 0832   K 3.9 01/18/2014 1549   K 4.2 07/04/2012 0832   CL 106 05/09/2013 1304   CL 106 07/04/2012 0832   CO2 27 01/18/2014 1549   CO2 26 07/04/2012 0832   BUN 15.2 01/18/2014 1549   BUN 17 07/04/2012 0832   CREATININE 0.8 01/18/2014 1549   CREATININE 0.82 07/04/2012 0832      Component Value Date/Time   CALCIUM 9.6 01/18/2014 1549   CALCIUM 9.2 07/04/2012 0832   ALKPHOS 91 01/18/2014 1549   ALKPHOS 71 07/04/2012 0832   AST 16 01/18/2014 1549   AST 18 07/04/2012 0832   ALT 11 01/18/2014 1549   ALT 12 07/04/2012 0832   BILITOT 0.30 01/18/2014 1549   BILITOT 0.7 07/04/2012 0832     Results for AMANAH, GARGER (MRN [id number]) as of 12/07/2013 10:51  Ref. Range 08/13/2013 08:35 08/24/2013 08:54 09/17/2013 14:47 10/03/2013 12:58 11/21/2013 10:02  CA 125 Latest Range: 0.0-30.2 U/mL 1869.2 (H) 1678.5 (H) 2907.6 (H) 3334.9 (H) 3181.2 (H)   CA 125  3181.2  11/21/2013    3334.9  10/03/2013    2907.6  09/17/2013    1678.5  08/24/2013    1869.2  08/13/2013    1814.8  05/09/2013    STUDIES: Ct Abdomen Pelvis W Contrast  12/04/2013   CLINICAL  DATA:  History of ovarian cancer  EXAM: CT ABDOMEN AND PELVIS WITH CONTRAST  TECHNIQUE: Multidetector CT imaging of the abdomen and pelvis was performed using the standard protocol following bolus administration of intravenous contrast.  CONTRAST:  138mL OMNIPAQUE IOHEXOL 300 MG/ML  SOLN  COMPARISON:  08/23/2013  FINDINGS: Evaluation lung bases demonstrates interval development of mixed airspace and interstitial lung disease involving the base of the lingula as well as the lateral and central aspects of the left lower  lobe. A focal more consolidative nodular component is identified along the lung base/dome of the diaphragm region measured on image 8 series 2, 13.4 x 9 mm.  The previously described capsular implant along the dome of the liver overlying in segment 7 is only appreciated on the coronal image. When correlated with prior study this finding appears stable. This area is appreciated on image 6 series 602 and correlated with image 15 series 602. This finding measures 12.1 x 9.1 cm in transverse by AP dimensions, previously measuring 12.3 x 9.4 cm. Lesion within segment 4 a appears stable again measuring at 2.5 x 2 cm image 16 series 2. This lesion is nearly Iso attenuated with liver parenchyma on the delayed imaging and hypoattenuated on the early phase imaging. The area of intrahepatic biliary ductal dilatation in segment 4B is minimally more prominent. The previous described low attenuating focus in segment 8 is stable. No new masses or nodules are identified within the liver.  The spleen, adrenals, pancreas are unremarkable. Extrarenal pelvises identified within the kidneys which otherwise unremarkable. There is no evidence of hydroureter, no hydronephrosis.  Retroperitoneal surgical clips are again identified. Enhancing prominent retroperitoneal lymph nodes are identified. The largest projects within the aorta caval region image 22 measuring 1 cm in short axis. Is there remaining retroperitoneal lymph  nodes are sub cm.  The soft tissue nodule within the pelvic sidewall is stable is findings on the left and measures 1.9 x 1.6 cm image 56 series 2 surgical clips again identified within this region. Small nodule 7 by components are again identified. A presacral soft tissue mass identified on the right measuring 2.2 x 1.6 cm image 29 series 2.  There is no evidence of bowel obstruction, abdominal or pelvic free fluid, loculated fluid collections. There is no evidence of abdominal aortic aneurysm. Celiac, SMA, IMA, portal vein are opacified, incidental note again is made of a retro aortic left renal vein. Intrahepatic biliary ductal dilatation is stable.  The osseous findings are unchanged. A stable compression deformity is identified involving L3. Stable sclerotic density is identified within the acetabulum on the left.  IMPRESSION: Stable areas of metastatic disease within the abdomen and pelvis specifically the liver, retroperitoneum regions and lower pelvis.  There has been development of airspace disease within the left lung base with a nodular component along the lower central base of the left lower lobe. Considering patient's history these findings are concerning and further evaluation dedicated chest CT with contrast is recommended.   Electronically Signed   By: Margaree Mackintosh M.D.   On: 12/04/2013 15:17     ASSESSMENT: 73 y.o.  Old lady (1)  with a history of ovarian cancer dating back to July 2002 when she underwent debulking surgery for what proved to be 4/18 lymph node positive disease.   (2)  She received 6 cycles of paclitaxel/carboplatin adjuvantly completed in December 2002.   (3)  She had her first recurrence in December 2005 treated with paclitaxel;   (4)  second recurrence May 2007 treated with [place] and paclitaxel;   (5)  third recurrence January 2010 treated with topotecan and Gemzar;   (6)  fourth recurrence January 2012 treated with single-agent carboplatin (January to August  2012) with initial response, then plateau; with progression January 2013 (less than 6 months after last carbo dose) treated with single-agent gemzar given Q2 weeks (January to April 2013), with evidence of progression  (7) Doxil strartedJuly 2013 6, with evidence of initial response, and fair tolerance.  Held between May and August 2014 due to patient's work schedule and scheduling issues.  Resumed treatment as of 08/24/2013, with some delays secondary to the patient's schedule. As of early December 2014, her total cumulative dose of Doxil reached 560 mg per meter square. In addition there was also an upward trend of the CA 125. Doxil was discontinued (last dose 10/30/2013). Echocardiogram on 11/14/2013 showed a well preserved ejection fraction of 55-60%.  (8) paclitaxel: Cycle 1 day 8 of each 21 day cycle initiated on 12/24/2013  (9). Pancytopenia secondary to chemotherapy-induced-Resolved We'll closely monitor CBC.   PLAN:  1. Patient is due to get cycle #3 day 1 of chemotherapy with paclitaxel today . Her labs are acceptable for treatment. Her CA 125 is improving very nicely on 01/18/2014 it was1212 which is decreased from 1772 on 12/24/2013. She says that she needs to go back to her work today and she cannot get chemotherapy today.   We'll reschedule her cycle 3 day 1 chemotherapy appointment to 02/15/2014 and cycle 3 day 8 to 02/22/2014   2.. Repeat CT of the chest abdomen and pelvis after 3 cycles of chemotherapy  6. Her next physician visit  is on 03/01/2014 and lab results weekly while on treatment  NOTE: The patient is very insistent that she "does not want to know numbers". We do not discuss the specifics of her CA 125 or other results with her, at her request. Instead we keep at at a very Gen. level ("things are stable", "things are getting better", but we need to make a change in treatment").  Ms.Hlavacek understood the current plan of care. She knows to call us with any questions or  concerns prior to her next visit.  Total time spent 30 minutes  Wilmon Arms, MD Medical oncology    02/08/2014

## 2014-02-09 LAB — CA 125: CA 125: 981.7 U/mL — AB (ref 0.0–30.2)

## 2014-02-11 ENCOUNTER — Telehealth: Payer: Self-pay | Admitting: Oncology

## 2014-02-11 NOTE — Telephone Encounter (Signed)
, °

## 2014-02-15 ENCOUNTER — Ambulatory Visit (HOSPITAL_BASED_OUTPATIENT_CLINIC_OR_DEPARTMENT_OTHER): Payer: Medicare Other

## 2014-02-15 ENCOUNTER — Encounter (INDEPENDENT_AMBULATORY_CARE_PROVIDER_SITE_OTHER): Payer: Self-pay

## 2014-02-15 ENCOUNTER — Ambulatory Visit: Payer: Medicare Other

## 2014-02-15 ENCOUNTER — Other Ambulatory Visit: Payer: Self-pay | Admitting: Oncology

## 2014-02-15 ENCOUNTER — Other Ambulatory Visit: Payer: Medicare Other

## 2014-02-15 ENCOUNTER — Other Ambulatory Visit (HOSPITAL_BASED_OUTPATIENT_CLINIC_OR_DEPARTMENT_OTHER): Payer: Medicare Other

## 2014-02-15 ENCOUNTER — Other Ambulatory Visit: Payer: Self-pay | Admitting: *Deleted

## 2014-02-15 VITALS — BP 140/67 | HR 71 | Temp 97.7°F | Resp 20

## 2014-02-15 DIAGNOSIS — C773 Secondary and unspecified malignant neoplasm of axilla and upper limb lymph nodes: Secondary | ICD-10-CM

## 2014-02-15 DIAGNOSIS — C569 Malignant neoplasm of unspecified ovary: Secondary | ICD-10-CM

## 2014-02-15 DIAGNOSIS — Z5111 Encounter for antineoplastic chemotherapy: Secondary | ICD-10-CM

## 2014-02-15 LAB — CBC WITH DIFFERENTIAL/PLATELET
BASO%: 0.8 % (ref 0.0–2.0)
Basophils Absolute: 0 10*3/uL (ref 0.0–0.1)
EOS%: 0.4 % (ref 0.0–7.0)
Eosinophils Absolute: 0 10*3/uL (ref 0.0–0.5)
HEMATOCRIT: 34.8 % (ref 34.8–46.6)
HEMOGLOBIN: 11.6 g/dL (ref 11.6–15.9)
LYMPH#: 1.8 10*3/uL (ref 0.9–3.3)
LYMPH%: 36.1 % (ref 14.0–49.7)
MCH: 31.1 pg (ref 25.1–34.0)
MCHC: 33.3 g/dL (ref 31.5–36.0)
MCV: 93.3 fL (ref 79.5–101.0)
MONO#: 0.6 10*3/uL (ref 0.1–0.9)
MONO%: 11 % (ref 0.0–14.0)
NEUT#: 2.6 10*3/uL (ref 1.5–6.5)
NEUT%: 51.7 % (ref 38.4–76.8)
PLATELETS: 158 10*3/uL (ref 145–400)
RBC: 3.73 10*6/uL (ref 3.70–5.45)
RDW: 15.7 % — ABNORMAL HIGH (ref 11.2–14.5)
WBC: 5 10*3/uL (ref 3.9–10.3)
nRBC: 0 % (ref 0–0)

## 2014-02-15 MED ORDER — SODIUM CHLORIDE 0.9 % IJ SOLN
10.0000 mL | INTRAMUSCULAR | Status: DC | PRN
  Administered 2014-02-15: 10 mL
  Filled 2014-02-15: qty 10

## 2014-02-15 MED ORDER — DIPHENHYDRAMINE HCL 50 MG/ML IJ SOLN
25.0000 mg | Freq: Once | INTRAMUSCULAR | Status: AC
  Administered 2014-02-15: 25 mg via INTRAVENOUS

## 2014-02-15 MED ORDER — PACLITAXEL CHEMO INJECTION 300 MG/50ML
80.0000 mg/m2 | Freq: Once | INTRAVENOUS | Status: AC
  Administered 2014-02-15: 120 mg via INTRAVENOUS
  Filled 2014-02-15: qty 20

## 2014-02-15 MED ORDER — FAMOTIDINE IN NACL 20-0.9 MG/50ML-% IV SOLN
20.0000 mg | Freq: Once | INTRAVENOUS | Status: AC
  Administered 2014-02-15: 20 mg via INTRAVENOUS

## 2014-02-15 MED ORDER — DEXAMETHASONE SODIUM PHOSPHATE 10 MG/ML IJ SOLN
INTRAMUSCULAR | Status: AC
  Filled 2014-02-15: qty 1

## 2014-02-15 MED ORDER — HEPARIN SOD (PORK) LOCK FLUSH 100 UNIT/ML IV SOLN
500.0000 [IU] | Freq: Once | INTRAVENOUS | Status: AC | PRN
  Administered 2014-02-15: 500 [IU]
  Filled 2014-02-15: qty 5

## 2014-02-15 MED ORDER — ONDANSETRON 8 MG/NS 50 ML IVPB
INTRAVENOUS | Status: AC
  Filled 2014-02-15: qty 8

## 2014-02-15 MED ORDER — DIPHENHYDRAMINE HCL 50 MG/ML IJ SOLN
INTRAMUSCULAR | Status: AC
  Filled 2014-02-15: qty 1

## 2014-02-15 MED ORDER — FAMOTIDINE IN NACL 20-0.9 MG/50ML-% IV SOLN
INTRAVENOUS | Status: AC
  Filled 2014-02-15: qty 50

## 2014-02-15 MED ORDER — DEXAMETHASONE SODIUM PHOSPHATE 10 MG/ML IJ SOLN
10.0000 mg | Freq: Once | INTRAMUSCULAR | Status: AC
  Administered 2014-02-15: 10 mg via INTRAVENOUS

## 2014-02-15 MED ORDER — SODIUM CHLORIDE 0.9 % IV SOLN
Freq: Once | INTRAVENOUS | Status: AC
Start: 2014-02-15 — End: 2014-02-15
  Administered 2014-02-15: 17:00:00 via INTRAVENOUS

## 2014-02-15 MED ORDER — ONDANSETRON 8 MG/50ML IVPB (CHCC)
8.0000 mg | Freq: Once | INTRAVENOUS | Status: AC
  Administered 2014-02-15: 8 mg via INTRAVENOUS

## 2014-02-21 ENCOUNTER — Other Ambulatory Visit: Payer: Medicare Other

## 2014-02-21 ENCOUNTER — Ambulatory Visit: Payer: Medicare Other

## 2014-02-22 ENCOUNTER — Other Ambulatory Visit: Payer: Self-pay | Admitting: *Deleted

## 2014-02-22 ENCOUNTER — Telehealth: Payer: Self-pay | Admitting: Oncology

## 2014-02-22 NOTE — Telephone Encounter (Signed)
, °

## 2014-02-25 ENCOUNTER — Telehealth: Payer: Self-pay | Admitting: *Deleted

## 2014-02-25 NOTE — Telephone Encounter (Signed)
I have adjusted 3/20 appt

## 2014-02-27 ENCOUNTER — Ambulatory Visit: Payer: Medicare Other

## 2014-02-27 ENCOUNTER — Other Ambulatory Visit: Payer: Medicare Other

## 2014-03-01 ENCOUNTER — Ambulatory Visit (HOSPITAL_BASED_OUTPATIENT_CLINIC_OR_DEPARTMENT_OTHER): Payer: Medicare Other

## 2014-03-01 ENCOUNTER — Other Ambulatory Visit (HOSPITAL_BASED_OUTPATIENT_CLINIC_OR_DEPARTMENT_OTHER): Payer: Medicare Other

## 2014-03-01 ENCOUNTER — Ambulatory Visit: Payer: Medicare Other

## 2014-03-01 VITALS — BP 152/74 | HR 68 | Temp 98.0°F | Resp 20

## 2014-03-01 DIAGNOSIS — C569 Malignant neoplasm of unspecified ovary: Secondary | ICD-10-CM

## 2014-03-01 DIAGNOSIS — Z5111 Encounter for antineoplastic chemotherapy: Secondary | ICD-10-CM

## 2014-03-01 DIAGNOSIS — C779 Secondary and unspecified malignant neoplasm of lymph node, unspecified: Secondary | ICD-10-CM

## 2014-03-01 LAB — CBC WITH DIFFERENTIAL/PLATELET
BASO%: 0.8 % (ref 0.0–2.0)
Basophils Absolute: 0 10*3/uL (ref 0.0–0.1)
EOS ABS: 0.1 10*3/uL (ref 0.0–0.5)
EOS%: 1 % (ref 0.0–7.0)
HEMATOCRIT: 39.4 % (ref 34.8–46.6)
HGB: 13 g/dL (ref 11.6–15.9)
LYMPH%: 34.7 % (ref 14.0–49.7)
MCH: 30.6 pg (ref 25.1–34.0)
MCHC: 33 g/dL (ref 31.5–36.0)
MCV: 92.7 fL (ref 79.5–101.0)
MONO#: 0.6 10*3/uL (ref 0.1–0.9)
MONO%: 10.9 % (ref 0.0–14.0)
NEUT%: 52.6 % (ref 38.4–76.8)
NEUTROS ABS: 2.7 10*3/uL (ref 1.5–6.5)
PLATELETS: 158 10*3/uL (ref 145–400)
RBC: 4.25 10*6/uL (ref 3.70–5.45)
RDW: 15.8 % — ABNORMAL HIGH (ref 11.2–14.5)
WBC: 5 10*3/uL (ref 3.9–10.3)
lymph#: 1.8 10*3/uL (ref 0.9–3.3)
nRBC: 0 % (ref 0–0)

## 2014-03-01 MED ORDER — DIPHENHYDRAMINE HCL 50 MG/ML IJ SOLN
INTRAMUSCULAR | Status: AC
  Filled 2014-03-01: qty 1

## 2014-03-01 MED ORDER — SODIUM CHLORIDE 0.9 % IJ SOLN
3.0000 mL | INTRAMUSCULAR | Status: DC | PRN
  Administered 2014-03-01 (×2): 10 mL via INTRAVENOUS
  Filled 2014-03-01: qty 10

## 2014-03-01 MED ORDER — SODIUM CHLORIDE 0.9 % IV SOLN
Freq: Once | INTRAVENOUS | Status: AC
  Administered 2014-03-01: 16:00:00 via INTRAVENOUS

## 2014-03-01 MED ORDER — ONDANSETRON 8 MG/50ML IVPB (CHCC)
8.0000 mg | Freq: Once | INTRAVENOUS | Status: AC
  Administered 2014-03-01: 8 mg via INTRAVENOUS

## 2014-03-01 MED ORDER — HEPARIN SOD (PORK) LOCK FLUSH 100 UNIT/ML IV SOLN
250.0000 [IU] | Freq: Once | INTRAVENOUS | Status: DC | PRN
  Filled 2014-03-01: qty 5

## 2014-03-01 MED ORDER — FAMOTIDINE IN NACL 20-0.9 MG/50ML-% IV SOLN
20.0000 mg | Freq: Once | INTRAVENOUS | Status: AC
  Administered 2014-03-01: 20 mg via INTRAVENOUS

## 2014-03-01 MED ORDER — FAMOTIDINE IN NACL 20-0.9 MG/50ML-% IV SOLN
INTRAVENOUS | Status: AC
  Filled 2014-03-01: qty 50

## 2014-03-01 MED ORDER — DEXAMETHASONE SODIUM PHOSPHATE 20 MG/5ML IJ SOLN
INTRAMUSCULAR | Status: AC
  Filled 2014-03-01: qty 5

## 2014-03-01 MED ORDER — DIPHENHYDRAMINE HCL 50 MG/ML IJ SOLN
25.0000 mg | Freq: Once | INTRAMUSCULAR | Status: AC
  Administered 2014-03-01: 25 mg via INTRAVENOUS

## 2014-03-01 MED ORDER — DEXAMETHASONE SODIUM PHOSPHATE 20 MG/5ML IJ SOLN
20.0000 mg | Freq: Once | INTRAMUSCULAR | Status: AC
  Administered 2014-03-01: 20 mg via INTRAVENOUS

## 2014-03-01 MED ORDER — PACLITAXEL CHEMO INJECTION 300 MG/50ML
80.0000 mg/m2 | Freq: Once | INTRAVENOUS | Status: AC
  Administered 2014-03-01: 120 mg via INTRAVENOUS
  Filled 2014-03-01: qty 20

## 2014-03-06 ENCOUNTER — Ambulatory Visit: Payer: Medicare Other | Admitting: Physician Assistant

## 2014-03-08 ENCOUNTER — Telehealth: Payer: Self-pay | Admitting: *Deleted

## 2014-03-08 ENCOUNTER — Other Ambulatory Visit (HOSPITAL_BASED_OUTPATIENT_CLINIC_OR_DEPARTMENT_OTHER): Payer: Medicare Other

## 2014-03-08 ENCOUNTER — Encounter: Payer: Self-pay | Admitting: Physician Assistant

## 2014-03-08 ENCOUNTER — Telehealth: Payer: Self-pay | Admitting: Physician Assistant

## 2014-03-08 ENCOUNTER — Ambulatory Visit (HOSPITAL_BASED_OUTPATIENT_CLINIC_OR_DEPARTMENT_OTHER): Payer: Medicare Other | Admitting: Physician Assistant

## 2014-03-08 ENCOUNTER — Ambulatory Visit (HOSPITAL_BASED_OUTPATIENT_CLINIC_OR_DEPARTMENT_OTHER): Payer: Medicare Other

## 2014-03-08 VITALS — BP 120/77 | HR 92 | Temp 97.9°F | Resp 18 | Ht 59.0 in | Wt 125.8 lb

## 2014-03-08 DIAGNOSIS — C569 Malignant neoplasm of unspecified ovary: Secondary | ICD-10-CM

## 2014-03-08 DIAGNOSIS — H5450 Low vision, one eye, unspecified eye: Secondary | ICD-10-CM

## 2014-03-08 DIAGNOSIS — Z5111 Encounter for antineoplastic chemotherapy: Secondary | ICD-10-CM

## 2014-03-08 LAB — CBC WITH DIFFERENTIAL/PLATELET
BASO%: 0.7 % (ref 0.0–2.0)
BASOS ABS: 0 10*3/uL (ref 0.0–0.1)
EOS ABS: 0.1 10*3/uL (ref 0.0–0.5)
EOS%: 1.7 % (ref 0.0–7.0)
HCT: 36.7 % (ref 34.8–46.6)
HEMOGLOBIN: 12.2 g/dL (ref 11.6–15.9)
LYMPH%: 22.5 % (ref 14.0–49.7)
MCH: 30.7 pg (ref 25.1–34.0)
MCHC: 33.2 g/dL (ref 31.5–36.0)
MCV: 92.2 fL (ref 79.5–101.0)
MONO#: 0.4 10*3/uL (ref 0.1–0.9)
MONO%: 7.3 % (ref 0.0–14.0)
NEUT%: 67.8 % (ref 38.4–76.8)
NEUTROS ABS: 4.1 10*3/uL (ref 1.5–6.5)
Platelets: 157 10*3/uL (ref 145–400)
RBC: 3.98 10*6/uL (ref 3.70–5.45)
RDW: 15.5 % — AB (ref 11.2–14.5)
WBC: 6 10*3/uL (ref 3.9–10.3)
lymph#: 1.4 10*3/uL (ref 0.9–3.3)
nRBC: 0 % (ref 0–0)

## 2014-03-08 LAB — COMPREHENSIVE METABOLIC PANEL (CC13)
ALBUMIN: 3.5 g/dL (ref 3.5–5.0)
ALT: 8 U/L (ref 0–55)
AST: 13 U/L (ref 5–34)
Alkaline Phosphatase: 91 U/L (ref 40–150)
Anion Gap: 9 mEq/L (ref 3–11)
BUN: 19.5 mg/dL (ref 7.0–26.0)
CO2: 26 meq/L (ref 22–29)
Calcium: 9.4 mg/dL (ref 8.4–10.4)
Chloride: 108 mEq/L (ref 98–109)
Creatinine: 0.9 mg/dL (ref 0.6–1.1)
GLUCOSE: 105 mg/dL (ref 70–140)
POTASSIUM: 4.2 meq/L (ref 3.5–5.1)
SODIUM: 142 meq/L (ref 136–145)
Total Bilirubin: 0.36 mg/dL (ref 0.20–1.20)
Total Protein: 6.7 g/dL (ref 6.4–8.3)

## 2014-03-08 MED ORDER — DEXAMETHASONE SODIUM PHOSPHATE 20 MG/5ML IJ SOLN
20.0000 mg | Freq: Once | INTRAMUSCULAR | Status: AC
Start: 2014-03-08 — End: 2014-03-08
  Administered 2014-03-08: 20 mg via INTRAVENOUS

## 2014-03-08 MED ORDER — FAMOTIDINE IN NACL 20-0.9 MG/50ML-% IV SOLN
INTRAVENOUS | Status: AC
  Filled 2014-03-08: qty 50

## 2014-03-08 MED ORDER — DEXAMETHASONE SODIUM PHOSPHATE 20 MG/5ML IJ SOLN
INTRAMUSCULAR | Status: AC
Start: 2014-03-08 — End: 2014-03-08
  Filled 2014-03-08: qty 5

## 2014-03-08 MED ORDER — HEPARIN SOD (PORK) LOCK FLUSH 100 UNIT/ML IV SOLN
500.0000 [IU] | Freq: Once | INTRAVENOUS | Status: AC | PRN
  Administered 2014-03-08: 500 [IU]
  Filled 2014-03-08: qty 5

## 2014-03-08 MED ORDER — DIPHENHYDRAMINE HCL 50 MG/ML IJ SOLN
INTRAMUSCULAR | Status: AC
  Filled 2014-03-08: qty 1

## 2014-03-08 MED ORDER — SODIUM CHLORIDE 0.9 % IV SOLN
80.0000 mg/m2 | Freq: Once | INTRAVENOUS | Status: AC
  Administered 2014-03-08: 120 mg via INTRAVENOUS
  Filled 2014-03-08: qty 20

## 2014-03-08 MED ORDER — DIPHENHYDRAMINE HCL 50 MG/ML IJ SOLN
25.0000 mg | Freq: Once | INTRAMUSCULAR | Status: AC
  Administered 2014-03-08: 25 mg via INTRAVENOUS

## 2014-03-08 MED ORDER — SODIUM CHLORIDE 0.9 % IJ SOLN
10.0000 mL | INTRAMUSCULAR | Status: DC | PRN
  Administered 2014-03-08: 10 mL
  Filled 2014-03-08: qty 10

## 2014-03-08 MED ORDER — ONDANSETRON 8 MG/NS 50 ML IVPB
INTRAVENOUS | Status: AC
  Filled 2014-03-08: qty 8

## 2014-03-08 MED ORDER — FAMOTIDINE IN NACL 20-0.9 MG/50ML-% IV SOLN
20.0000 mg | Freq: Once | INTRAVENOUS | Status: AC
  Administered 2014-03-08: 20 mg via INTRAVENOUS

## 2014-03-08 MED ORDER — ONDANSETRON 8 MG/50ML IVPB (CHCC)
8.0000 mg | Freq: Once | INTRAVENOUS | Status: AC
  Administered 2014-03-08: 8 mg via INTRAVENOUS

## 2014-03-08 MED ORDER — SODIUM CHLORIDE 0.9 % IV SOLN
Freq: Once | INTRAVENOUS | Status: AC
  Administered 2014-03-08: 15:00:00 via INTRAVENOUS

## 2014-03-08 NOTE — Telephone Encounter (Signed)
, °

## 2014-03-08 NOTE — Progress Notes (Signed)
Patient refused AVS. 

## 2014-03-08 NOTE — Progress Notes (Signed)
ID: Cassandra Nichols   DOB: November 22, 1941  MR#: [id number]  [id number]   PCP:  Marjorie Smolder, MD GYN:  [name], MD OTHER:  [name], MD  CHIEF COMPLAINT:  Recurrent Ovarian Cancer  HISTORY OF PRESENT ILLNESS: Cassandra Nichols was initially diagnosed with ovarian cancer in July of 2002. She underwent debulking surgery at that time for what proved to be a 4 of 18 lymph nodes positive disease. She was initially treated with 6 cycles of paclitaxel and carboplatin adjuvantly, completed in December of 2002.  Patient has had multiple recurrences since December of 2002, and has been treated with multiple agents as noted below.  INTERVAL HISTORY: Cassandra Nichols returns alone today for followup of her ovarian cancer.  She continues to receive treatment with paclitaxel, given on days 1 and 8 of each 21 day cycle. She is due for day 8 cycle 4 today.  She is tolerating his treatment extremely well, in fact tells me she is having "no problems ". 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 She does try very hard to coordinate her appointments with her work schedule to avoid missing any more works and she absolutely has to. Accordingly, she likes to have her appointments on Friday afternoons when possible.   Cassandra Nichols only complaint today is the fact that she has noticed some decreased vision in her left eye. She describes this more as "blurred vision" and thinks she may just need new glasses. She typically sees [name]. She denies any pain or pressure in the eye, has had no diplopia, and denies any abnormal headaches.   REVIEW OF SYSTEMS: Cassandra Nichols  has had no recent illnesses and denies fevers, chills, night sweats, or hot flashes. Her energy level is good, as is her appetite. She denies any nausea or emesis and has had no change in bowel or bladder habits. She tends to have some mild urinary incontinence, especially for a couple of days after her IV infusions. This is not new, and  it remained stable. She's had no dysuria or hematuria. She denies any abnormal bruising or bleeding and has had no rashes or skin changes. She also denies any cough, phlegm production, shortness of breath, peripheral swelling, chest pain, palpitations. She's had no recent headaches and denies any dizziness. She currently denies any unusual myalgias, arthralgias, bony pain, or peripheral neuropathy.  A detailed review of systems is otherwise stable and noncontributory.    PAST MEDICAL HISTORY: Past Medical History  Diagnosis Date  . Shingles   . Ovarian cancer 11/17/2011  . Cancer     PAST SURGICAL HISTORY: Past Surgical History  Procedure Laterality Date  . Ganglion cyst excision Left     Wrist    FAMILY HISTORY Family History  Problem Relation Age of Onset  . Hypertension Mother   . GER disease Mother   . Cancer Father     Cancer (stomach)    GYNECOLOGIC HISTORY: GXP2  SOCIAL HISTORY: (Updated 03/08/2014) Cassandra Nichols lives alone. She works temporary jobs, sometimes as a Oceanographer, and sometimes grading tests.  As of March 2015, she is working full-time hours at the [employer] center. She's primary caregiver for her and who is currently 72 and lives in [place].  Her daughter Cassandra Nichols lives in [place] and is Mudlogger of the epidemiology and [place] there; she is married with 2 children. Son  Cassandra Nichols lives in [place], [place]. He has 4 children of his own.His  wife is a Engineer, civil (consulting)  ADVANCED DIRECTIVES: in place  HEALTH MAINTENANCE:  (Updated  03/08/2014) History  Substance Use Topics  . Smoking status: Former Games developer  . Smokeless tobacco: Never Used  . Alcohol Use: No     Comment: Occasionally     Colonoscopy: Not on file  PAP:   Bone density: Not on file  Lipid panel: Not on file  Mammogram:  Feb 2103, [facility]   Allergies  Allergen Reactions  . Penicillins Rash    Current Outpatient Prescriptions  Medication Sig Dispense Refill  . fluticasone (FLONASE) 50  MCG/ACT nasal spray Place 1 spray into both nostrils daily.  16 g  2  . loratadine (CLARITIN) 10 MG tablet Take 10 mg by mouth daily as needed for allergies.      . Melatonin 3 MG TABS Take 3 mg by mouth at bedtime as needed.      Marland Kitchen acyclovir (ZOVIRAX) 400 MG tablet       . Alum & Mag Hydroxide-Simeth (MAGIC MOUTHWASH W/LIDOCAINE) SOLN Take 5 mLs by mouth 4 (four) times daily as needed.  240 mL  2  . ibuprofen (ADVIL,MOTRIN) 800 MG tablet Take 800 mg by mouth every 8 (eight) hours as needed.        . ondansetron (ZOFRAN) 8 MG tablet Take 1 tablet (8 mg total) by mouth 2 (two) times daily. Take two times a day starting the day after chemo for 2 days. Then take two times a day as needed for nausea or vomiting.  30 tablet  1  . prochlorperazine (COMPAZINE) 10 MG tablet Take 1 tablet (10 mg total) by mouth every 6 (six) hours as needed (Nausea or vomiting).  30 tablet  1   No current facility-administered medications for this visit.   Facility-Administered Medications Ordered in Other Visits  Medication Dose Route Frequency Provider Last Rate Last Dose  . heparin lock flush 100 unit/mL  500 Units Intracatheter Once PRN [name], MD      . sodium chloride 0.9 % injection 10 mL  10 mL Intracatheter PRN [name], MD        OBJECTIVE: 73-year-old white woman who appears well and is in no acute distress  Filed Vitals:   03/08/14 1338  BP: 120/77  Pulse: 92  Temp: 97.9 F (36.6 C)  Resp: 18     Body mass index is 25.4 kg/(m^2).    ECOG FS: 0 Filed Weights   03/08/14 1338  Weight: 125 lb 12.8 oz (57.063 kg)   Physical Exam: HEENT:  Sclerae anicteric.  Oropharynx clear, pink and moist with no ulcerations or evidence of candidiasis. Neck supple, trachea midline. No thyromegaly.  NODES:  No cervical, supraclavicular, or axillary lymphadenopathy palpated.  BREAST EXAM: Deferred. LUNGS:  Clear to auscultation bilaterally with good excursion.  No crackles, wheezes or  rhonchi HEART:  Regular rate and rhythm. No murmur appreciated. ABDOMEN:  Soft, nontender. No hepatomegaly or masses palpated. No guarding or rebound. Positive bowel sounds.  MSK:  No focal spinal tenderness to palpation. Good range of motion bilaterally in both the upper and lower extremities. EXTREMITIES:  No peripheral edema.   SKIN:  Benign with no new skin rashes or skin lesions. No excessive ecchymoses. No petechiae. No pallor. NEURO:  Nonfocal. Well oriented.  Positive affect.     LAB RESULTS: Lab Results  Component Value Date   WBC 6.0 03/08/2014   NEUTROABS 4.1 03/08/2014   HGB 12.2 03/08/2014   HCT 36.7  03/08/2014   MCV 92.2 03/08/2014   PLT 157 03/08/2014      Chemistry      Component Value Date/Time   NA 142 03/08/2014 1437   NA 140 07/04/2012 0832   K 4.2 03/08/2014 1437   K 4.2 07/04/2012 0832   CL 106 05/09/2013 1304   CL 106 07/04/2012 0832   CO2 26 03/08/2014 1437   CO2 26 07/04/2012 0832   BUN 19.5 03/08/2014 1437   BUN 17 07/04/2012 0832   CREATININE 0.9 03/08/2014 1437   CREATININE 0.82 07/04/2012 0832      Component Value Date/Time   CALCIUM 9.4 03/08/2014 1437   CALCIUM 9.2 07/04/2012 0832   ALKPHOS 91 03/08/2014 1437   ALKPHOS 71 07/04/2012 0832   AST 13 03/08/2014 1437   AST 18 07/04/2012 0832   ALT 8 03/08/2014 1437   ALT 12 07/04/2012 0832   BILITOT 0.36 03/08/2014 1437   BILITOT 0.7 07/04/2012 0832      CA 125  Pending 03/08/2014     981.7  02/08/2014    1212.0  01/18/2014    1772.3  12/24/2013    3181.2  11/21/2013    3334.9  10/03/2013    2907.6  09/17/2013    1678.5  08/24/2013    1869.2  08/13/2013    1814.8  05/09/2013    STUDIES: Ct Abdomen Pelvis W Contrast 12/04/2013   CLINICAL DATA:  History of ovarian cancer  EXAM: CT ABDOMEN AND PELVIS WITH CONTRAST  TECHNIQUE: Multidetector CT imaging of the abdomen and pelvis was performed using the standard protocol following bolus administration of intravenous contrast.  CONTRAST:  153mL OMNIPAQUE  IOHEXOL 300 MG/ML  SOLN  COMPARISON:  08/23/2013  FINDINGS: Evaluation lung bases demonstrates interval development of mixed airspace and interstitial lung disease involving the base of the lingula as well as the lateral and central aspects of the left lower lobe. A focal more consolidative nodular component is identified along the lung base/dome of the diaphragm region measured on image 8 series 2, 13.4 x 9 mm.  The previously described capsular implant along the dome of the liver overlying in segment 7 is only appreciated on the coronal image. When correlated with prior study this finding appears stable. This area is appreciated on image 6 series 602 and correlated with image 15 series 602. This finding measures 12.1 x 9.1 cm in transverse by AP dimensions, previously measuring 12.3 x 9.4 cm. Lesion within segment 4 a appears stable again measuring at 2.5 x 2 cm image 16 series 2. This lesion is nearly Iso attenuated with liver parenchyma on the delayed imaging and hypoattenuated on the early phase imaging. The area of intrahepatic biliary ductal dilatation in segment 4B is minimally more prominent. The previous described low attenuating focus in segment 8 is stable. No new masses or nodules are identified within the liver.  The spleen, adrenals, pancreas are unremarkable. Extrarenal pelvises identified within the kidneys which otherwise unremarkable. There is no evidence of hydroureter, no hydronephrosis.  Retroperitoneal surgical clips are again identified. Enhancing prominent retroperitoneal lymph nodes are identified. The largest projects within the aorta caval region image 22 measuring 1 cm in short axis. Is there remaining retroperitoneal lymph nodes are sub cm.  The soft tissue nodule within the pelvic sidewall is stable is findings on the left and measures 1.9 x 1.6 cm image 56 series 2 surgical clips again identified within this region. Small nodule 7 by components are again identified. A presacral soft  tissue mass identified on the right measuring 2.2 x 1.6 cm image 29 series 2.  There is no evidence of bowel obstruction, abdominal or pelvic free fluid, loculated fluid collections. There is no evidence of abdominal aortic aneurysm. Celiac, SMA, IMA, portal vein are opacified, incidental note again is made of a retro aortic left renal vein. Intrahepatic biliary ductal dilatation is stable.  The osseous findings are unchanged. A stable compression deformity is identified involving L3. Stable sclerotic density is identified within the acetabulum on the left.  IMPRESSION: Stable areas of metastatic disease within the abdomen and pelvis specifically the liver, retroperitoneum regions and lower pelvis.  There has been development of airspace disease within the left lung base with a nodular component along the lower central base of the left lower lobe. Considering patient's history these findings are concerning and further evaluation dedicated chest CT with contrast is recommended.   Electronically Signed   By: Margaree Mackintosh M.D.   On: 12/04/2013 15:17     ASSESSMENT: 73 y.o.  Old lady (1)  with a history of ovarian cancer dating back to July 2002 when she underwent debulking surgery for what proved to be 4/18 lymph node positive disease.   (2)  She received 6 cycles of paclitaxel/carboplatin adjuvantly completed in December 2002.   (3)  She had her first recurrence in December 2005 treated with paclitaxel;   (4)  second recurrence May 2007 treated with [place] and paclitaxel;   (5)  third recurrence January 2010 treated with topotecan and Gemzar;   (6)  fourth recurrence January 2012 treated with single-agent carboplatin (January to August 2012) with initial response, then plateau; with progression January 2013 (less than 6 months after last carbo dose) treated with single-agent gemzar given Q2 weeks (January to April 2013), with evidence of progression  (7) Doxil strartedJuly 2013 6, with evidence of  initial response, and fair tolerance. Held between May and August 2014 due to patient's work schedule and scheduling issues.  Resumed treatment as of 08/24/2013, with some delays secondary to the patient's schedule. As of early December 2014, her total cumulative dose of Doxil reached 560 mg per meter square. In addition there was also an upward trend of the CA 125. Doxil was discontinued (last dose 10/30/2013). Echocardiogram on 11/14/2013 showed a well preserved ejection fraction of 55-60%.  (8) being treated with single agent paclitaxel, with a drug given on days one and 8 of each 21 day cycle, first dose on 12/27/2013.   PLAN:  Cherryl Babin appears to be doing very well, and is tolerating this chemotherapy exceptionally well.  her tumor marker has been drawn today, with results pending. I'm making no changes in her current regimen.  She will return in 2 weeks to initiate day 1 cycle 5 on April 3. We will try to get her an appointment at a convenient time to see Dr. Jana Hakim in early April so that they might discuss her long-term plan, and decide when to restage with CT scan of the chest/abdomen/pelvis.   I did recommend that she followup with [name] with regards to the blurred vision in the left eye. She tells me she would like to "wait on that for a while", but agrees to scheduled appointment if the vision worsened or if she has any additional changes. Certainly if she has any pain in the eye she will call immediately.  Kenniyah Sasaki voices her understanding of the above, and will call with any changes or problems prior to  her next scheduled appointment.   Durene Fruits Medical oncology    03/08/2014

## 2014-03-08 NOTE — Patient Instructions (Signed)
[facility] Discharge Instructions for Patients Receiving Chemotherapy  Today you received the following chemotherapy agents: Taxol  To help prevent nausea and vomiting after your treatment, we encourage you to take your nausea medication as prescribed by your physician.  If you develop nausea and vomiting that is not controlled by your nausea medication, call the clinic.   BELOW ARE SYMPTOMS THAT SHOULD BE REPORTED IMMEDIATELY:  *FEVER GREATER THAN 100.5 F  *CHILLS WITH OR WITHOUT FEVER  NAUSEA AND VOMITING THAT IS NOT CONTROLLED WITH YOUR NAUSEA MEDICATION  *UNUSUAL SHORTNESS OF BREATH  *UNUSUAL BRUISING OR BLEEDING  TENDERNESS IN MOUTH AND THROAT WITH OR WITHOUT PRESENCE OF ULCERS  *URINARY PROBLEMS  *BOWEL PROBLEMS  UNUSUAL RASH Items with * indicate a potential emergency and should be followed up as soon as possible.  Feel free to call the clinic you have any questions or concerns. The clinic phone number is [phone].    

## 2014-03-08 NOTE — Telephone Encounter (Signed)
Per staff message and POF I have scheduled appts.  JMW  

## 2014-03-09 LAB — CA 125: CA 125: 575.8 U/mL — ABNORMAL HIGH (ref 0.0–30.2)

## 2014-03-11 ENCOUNTER — Ambulatory Visit: Payer: Medicare Other

## 2014-03-15 ENCOUNTER — Ambulatory Visit: Payer: Medicare Other

## 2014-03-17 ENCOUNTER — Encounter: Payer: Self-pay | Admitting: Oncology

## 2014-03-22 ENCOUNTER — Telehealth: Payer: Self-pay | Admitting: Oncology

## 2014-03-22 ENCOUNTER — Other Ambulatory Visit (HOSPITAL_BASED_OUTPATIENT_CLINIC_OR_DEPARTMENT_OTHER): Payer: Medicare Other

## 2014-03-22 ENCOUNTER — Ambulatory Visit (HOSPITAL_BASED_OUTPATIENT_CLINIC_OR_DEPARTMENT_OTHER): Payer: Medicare Other

## 2014-03-22 ENCOUNTER — Ambulatory Visit (HOSPITAL_BASED_OUTPATIENT_CLINIC_OR_DEPARTMENT_OTHER): Payer: Medicare Other | Admitting: Oncology

## 2014-03-22 ENCOUNTER — Telehealth: Payer: Self-pay | Admitting: *Deleted

## 2014-03-22 VITALS — BP 137/81 | HR 83 | Temp 98.3°F | Resp 18 | Ht 59.0 in | Wt 126.9 lb

## 2014-03-22 DIAGNOSIS — C779 Secondary and unspecified malignant neoplasm of lymph node, unspecified: Secondary | ICD-10-CM

## 2014-03-22 DIAGNOSIS — C569 Malignant neoplasm of unspecified ovary: Secondary | ICD-10-CM

## 2014-03-22 DIAGNOSIS — Z5111 Encounter for antineoplastic chemotherapy: Secondary | ICD-10-CM

## 2014-03-22 DIAGNOSIS — H538 Other visual disturbances: Secondary | ICD-10-CM

## 2014-03-22 LAB — CBC WITH DIFFERENTIAL/PLATELET
BASO%: 1.3 % (ref 0.0–2.0)
Basophils Absolute: 0.1 10*3/uL (ref 0.0–0.1)
EOS ABS: 0.1 10*3/uL (ref 0.0–0.5)
EOS%: 2.1 % (ref 0.0–7.0)
HCT: 32.8 % — ABNORMAL LOW (ref 34.8–46.6)
HGB: 10.8 g/dL — ABNORMAL LOW (ref 11.6–15.9)
LYMPH#: 1.3 10*3/uL (ref 0.9–3.3)
LYMPH%: 34.7 % (ref 14.0–49.7)
MCH: 30.3 pg (ref 25.1–34.0)
MCHC: 32.9 g/dL (ref 31.5–36.0)
MCV: 91.9 fL (ref 79.5–101.0)
MONO#: 0.6 10*3/uL (ref 0.1–0.9)
MONO%: 14.7 % — ABNORMAL HIGH (ref 0.0–14.0)
NEUT%: 47.2 % (ref 38.4–76.8)
NEUTROS ABS: 1.8 10*3/uL (ref 1.5–6.5)
NRBC: 0 % (ref 0–0)
Platelets: 170 10*3/uL (ref 145–400)
RBC: 3.57 10*6/uL — ABNORMAL LOW (ref 3.70–5.45)
RDW: 17 % — AB (ref 11.2–14.5)
WBC: 3.8 10*3/uL — ABNORMAL LOW (ref 3.9–10.3)

## 2014-03-22 MED ORDER — DIPHENHYDRAMINE HCL 50 MG/ML IJ SOLN
INTRAMUSCULAR | Status: AC
  Filled 2014-03-22: qty 1

## 2014-03-22 MED ORDER — SODIUM CHLORIDE 0.9 % IJ SOLN
10.0000 mL | INTRAMUSCULAR | Status: DC | PRN
  Administered 2014-03-22: 10 mL
  Filled 2014-03-22: qty 10

## 2014-03-22 MED ORDER — FAMOTIDINE IN NACL 20-0.9 MG/50ML-% IV SOLN
20.0000 mg | Freq: Once | INTRAVENOUS | Status: AC
  Administered 2014-03-22: 20 mg via INTRAVENOUS

## 2014-03-22 MED ORDER — DEXAMETHASONE SODIUM PHOSPHATE 10 MG/ML IJ SOLN
INTRAMUSCULAR | Status: AC
  Filled 2014-03-22: qty 1

## 2014-03-22 MED ORDER — DEXAMETHASONE SODIUM PHOSPHATE 10 MG/ML IJ SOLN
4.0000 mg | Freq: Once | INTRAMUSCULAR | Status: AC
  Administered 2014-03-22: 4 mg via INTRAVENOUS

## 2014-03-22 MED ORDER — HEPARIN SOD (PORK) LOCK FLUSH 100 UNIT/ML IV SOLN
500.0000 [IU] | Freq: Once | INTRAVENOUS | Status: AC | PRN
  Administered 2014-03-22: 500 [IU]
  Filled 2014-03-22: qty 5

## 2014-03-22 MED ORDER — ONDANSETRON 8 MG/50ML IVPB (CHCC)
8.0000 mg | Freq: Once | INTRAVENOUS | Status: AC
  Administered 2014-03-22: 8 mg via INTRAVENOUS

## 2014-03-22 MED ORDER — SODIUM CHLORIDE 0.9 % IV SOLN
Freq: Once | INTRAVENOUS | Status: AC
  Administered 2014-03-22: 16:00:00 via INTRAVENOUS

## 2014-03-22 MED ORDER — SODIUM CHLORIDE 0.9 % IV SOLN
80.0000 mg/m2 | Freq: Once | INTRAVENOUS | Status: AC
  Administered 2014-03-22: 120 mg via INTRAVENOUS
  Filled 2014-03-22: qty 20

## 2014-03-22 MED ORDER — DIPHENHYDRAMINE HCL 50 MG/ML IJ SOLN
25.0000 mg | Freq: Once | INTRAMUSCULAR | Status: AC
  Administered 2014-03-22: 25 mg via INTRAVENOUS

## 2014-03-22 MED ORDER — FAMOTIDINE IN NACL 20-0.9 MG/50ML-% IV SOLN
INTRAVENOUS | Status: AC
  Filled 2014-03-22: qty 50

## 2014-03-22 MED ORDER — ONDANSETRON 8 MG/NS 50 ML IVPB
INTRAVENOUS | Status: AC
  Filled 2014-03-22: qty 8

## 2014-03-22 NOTE — Telephone Encounter (Signed)
, °

## 2014-03-22 NOTE — Progress Notes (Signed)
ID: Cassandra Nichols   DOB: August 27, 1941  MR#: [id number]  FYB#:[id number]   PCP:  Marjorie Smolder, MD GYN:  [name], MD OTHER:  [name], MD  CHIEF COMPLAINT:  Recurrent Ovarian Cancer  HISTORY OF PRESENT ILLNESS: Cassandra Nichols was initially diagnosed with ovarian cancer in July of 2002. She underwent debulking surgery at that time for what proved to be a 4 of 18 lymph nodes positive disease. She was initially treated with 6 cycles of paclitaxel and carboplatin adjuvantly, completed in December of 2002.  Patient has had multiple recurrences since December of 2002, and has been treated with multiple agents as noted below.  INTERVAL HISTORY: Cassandra Nichols returns today for followup of her ovarian cancer.  She continues to receive treatment with paclitaxel, given on days 1 and 8 of each 21 day cycle. She is due for day 1 cycle 5 today--she has had a total of 7 doses because she missed the day 8 treatment of cycle 3 because of travel conflicts   REVIEW OF SYSTEMS: Cassandra Nichols  is doing remarkably well with her treatments. She just went to a weekend in [place] which is spent with her son, and she is now reconciled with her daughter in [place]. She had a nice visit with a friend in [place]. 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 She likes the people she works with an change your dosage of. She works from 8 01/22/1928 everyday.--Day 1 into each treatment she has a little bit of urinary leakage, which then clears up. She does have a history of stress urinary incontinence which is separate. Does have no nausea, vomiting, mouth sores, or any other acute symptoms. She has lost a tooth and has a little bit of blurred vision in her left eye likely due to cataract. She does not want to go to the eye doctor at this point, waiting until she is "done with the chemotherapy". The only concern is a little bit of numbness in her toe. There is no neuropathy in her fingers. We discussed the  toes issue at length today and she does not believe it is progressive or changed at all from the last several weeks. This however will need to be watched closely. A detailed review of systems today was otherwise noncontributory   PAST MEDICAL HISTORY: Past Medical History  Diagnosis Date  . Shingles   . Ovarian cancer 11/17/2011  . Cancer     PAST SURGICAL HISTORY: Past Surgical History  Procedure Laterality Date  . Ganglion cyst excision Left     Wrist    FAMILY HISTORY Family History  Problem Relation Age of Onset  . Hypertension Mother   . GER disease Mother   . Cancer Father     Cancer (stomach)    GYNECOLOGIC HISTORY: GXP2  SOCIAL HISTORY: (Updated 03/08/2014) Cassandra Nichols lives alone. She works temporary jobs, sometimes as a Oceanographer, and sometimes grading tests.  As of March 2015, she is working full-time hours at the [employer] center. She's primary caregiver for her and who is currently 5 and lives in [place].  Her daughter [name] lives in [place] and is Mudlogger of the epidemiology and [employer] there; she is married with 2 children. Son  [name] lives in [place], [place]. He has 4 children of his own.His wife is a Marine scientist  ADVANCED DIRECTIVES: in place  HEALTH MAINTENANCE:  (Updated  03/08/2014) History  Substance Use Topics  . Smoking status:  Former Smoker  . Smokeless tobacco: Never Used  . Alcohol Use: No     Comment: Occasionally     Colonoscopy: Not on file  PAP:   Bone density: Not on file  Lipid panel: Not on file  Mammogram:  Feb 2103, [facility]   Allergies  Allergen Reactions  . Penicillins Rash    Current Outpatient Prescriptions  Medication Sig Dispense Refill  . acyclovir (ZOVIRAX) 400 MG tablet       . Alum & Mag Hydroxide-Simeth (MAGIC MOUTHWASH W/LIDOCAINE) SOLN Take 5 mLs by mouth 4 (four) times daily as needed.  240 mL  2  . fluticasone (FLONASE) 50 MCG/ACT nasal spray Place 1 spray into both nostrils daily.  16 g  2  . ibuprofen  (ADVIL,MOTRIN) 800 MG tablet Take 800 mg by mouth every 8 (eight) hours as needed.        . loratadine (CLARITIN) 10 MG tablet Take 10 mg by mouth daily as needed for allergies.      . Melatonin 3 MG TABS Take 3 mg by mouth at bedtime as needed.      . ondansetron (ZOFRAN) 8 MG tablet Take 1 tablet (8 mg total) by mouth 2 (two) times daily. Take two times a day starting the day after chemo for 2 days. Then take two times a day as needed for nausea or vomiting.  30 tablet  1  . prochlorperazine (COMPAZINE) 10 MG tablet Take 1 tablet (10 mg total) by mouth every 6 (six) hours as needed (Nausea or vomiting).  30 tablet  1   No current facility-administered medications for this visit.    OBJECTIVE: Middle-aged white woman in no acute distress  Filed Vitals:   03/22/14 1426  BP: 137/81  Pulse: 83  Temp: 98.3 F (36.8 C)  Resp: 18     Body mass index is 25.62 kg/(m^2).    ECOG FS: 0 Filed Weights   03/22/14 1426  Weight: 126 lb 14.4 oz (57.561 kg)   Normocephalic; wearing a flattering wig Sclerae unicteric, pupils equal, round and reactive Oropharynx clear and moist-- no thrush No cervical or supraclavicular adenopathy Lungs no rales or rhonchi Heart regular rate and rhythm Abd soft, nontender, positive bowel sounds, no swelling or shifting dullness, no masses palpated MSK no focal spinal tenderness, no upper extremity lymphedema Neuro: nonfocal, well oriented, appropriate affect Breasts: Deferred      LAB RESULTS: Results for MOANI, WEIPERT (MRN [id number]) as of 03/22/2014 14:37  Ref. Range 11/21/2013 10:02 12/24/2013 12:37 01/18/2014 15:48 02/08/2014 11:17 03/08/2014 14:37  CA 125 Latest Range: 0.0-30.2 U/mL 3181.2 (H) 1772.3 (H) 1212.0 (H) 981.7 (H) 575.8 (H)    Lab Results  Component Value Date   WBC 3.8* 03/22/2014   NEUTROABS 1.8 03/22/2014   HGB 10.8* 03/22/2014   HCT 32.8* 03/22/2014   MCV 91.9 03/22/2014   PLT 170 03/22/2014      Chemistry      Component Value Date/Time   NA 142  03/08/2014 1437   NA 140 07/04/2012 0832   K 4.2 03/08/2014 1437   K 4.2 07/04/2012 0832   CL 106 05/09/2013 1304   CL 106 07/04/2012 0832   CO2 26 03/08/2014 1437   CO2 26 07/04/2012 0832   BUN 19.5 03/08/2014 1437   BUN 17 07/04/2012 0832   CREATININE 0.9 03/08/2014 1437   CREATININE 0.82 07/04/2012 0832      Component Value Date/Time   CALCIUM 9.4 03/08/2014 1437   CALCIUM 9.2  07/04/2012 0832   ALKPHOS 91 03/08/2014 1437   ALKPHOS 71 07/04/2012 0832   AST 13 03/08/2014 1437   AST 18 07/04/2012 0832   ALT 8 03/08/2014 1437   ALT 12 07/04/2012 0832   BILITOT 0.36 03/08/2014 1437   BILITOT 0.7 07/04/2012 0832      CA 125  Pending 03/08/2014     981.7  02/08/2014    1212.0  01/18/2014    1772.3  12/24/2013    3181.2  11/21/2013    3334.9  10/03/2013    2907.6  09/17/2013    1678.5  08/24/2013    1869.2  08/13/2013    1814.8  05/09/2013    STUDIES: No results found.  ASSESSMENT: 73 y.o.  Old lady (1)  with a history of ovarian cancer dating back to July 2002 when she underwent debulking surgery for what proved to be 4/18 lymph node positive disease.   (2)  She received 6 cycles of paclitaxel/carboplatin adjuvantly completed in December 2002.   (3)  She had her first recurrence in December 2005 treated with paclitaxel;   (4)  second recurrence May 2007 treated with [place] and paclitaxel;   (5)  third recurrence January 2010 treated with topotecan and Gemzar;   (6)  fourth recurrence January 2012 treated with single-agent carboplatin (January to August 2012) with initial response, then plateau; with progression January 2013 (less than 6 months after last carbo dose) treated with single-agent gemzar given Q2 weeks (January to April 2013), with evidence of progression  (7) Doxil strartedJuly 2013 6, with evidence of initial response, and fair tolerance. Held between May and August 2014 due to patient's work schedule and scheduling issues.  Resumed treatment as of 08/24/2013, with some  delays secondary to the patient's schedule. As of early December 2014, her total cumulative dose of Doxil reached 560 mg per meter square. In addition there was also an upward trend of the CA 125. Doxil was discontinued (last dose 10/30/2013). Echocardiogram on 11/14/2013 showed a well preserved ejection fraction of 55-60%.  (8) being treated with single agent paclitaxel, with a drug given on days one and 8 of each 21 day cycle, first dose on 12/27/2013.   PLAN:  Saphyre Cillo is having a very nice response to her paclitaxel. She is also tolerating it quite well. The only concern I have is the "very little bit" of numbness in her toes. I am asking her to really watch out to see if that changes any and if it does we will not treat her on April 10 but consider going to every 2 weeks.  Otherwise I would prefer to press on, since she is really doing terrific. I have made her a return appointment with me for April 22, late in the day so she does not have to miss work. She should then be able to restart treatment on April 24 and I have made her an appointment to see Dr. Earnest Conroy may first. By then, if we have made no changes, she will have had 11 doses of Taxol (6 cycles) and we can consider a period of observation.  I have encouraged her to see her ophthalmologist regarding the blurred vision in the left eye. 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 She agrees with that. She knows a goal of treatment in her case is control. She  will call with any other problems that may develop before next visit here.  Chauncey Cruel, MD Medical oncology    03/22/2014

## 2014-03-22 NOTE — Telephone Encounter (Signed)
Per staff phone call and POF I have schedueld appts.  JMW  

## 2014-03-25 ENCOUNTER — Other Ambulatory Visit: Payer: Self-pay

## 2014-03-29 ENCOUNTER — Other Ambulatory Visit (HOSPITAL_BASED_OUTPATIENT_CLINIC_OR_DEPARTMENT_OTHER): Payer: Medicare Other

## 2014-03-29 ENCOUNTER — Ambulatory Visit (HOSPITAL_BASED_OUTPATIENT_CLINIC_OR_DEPARTMENT_OTHER): Payer: Medicare Other

## 2014-03-29 VITALS — BP 145/64 | HR 85 | Temp 98.4°F

## 2014-03-29 DIAGNOSIS — C569 Malignant neoplasm of unspecified ovary: Secondary | ICD-10-CM

## 2014-03-29 DIAGNOSIS — Z5111 Encounter for antineoplastic chemotherapy: Secondary | ICD-10-CM

## 2014-03-29 DIAGNOSIS — C779 Secondary and unspecified malignant neoplasm of lymph node, unspecified: Secondary | ICD-10-CM

## 2014-03-29 LAB — COMPREHENSIVE METABOLIC PANEL (CC13)
ALBUMIN: 3.5 g/dL (ref 3.5–5.0)
ALK PHOS: 86 U/L (ref 40–150)
ALT: <6 U/L (ref 0–55)
AST: 13 U/L (ref 5–34)
Anion Gap: 9 mEq/L (ref 3–11)
BUN: 17.8 mg/dL (ref 7.0–26.0)
CO2: 25 mEq/L (ref 22–29)
CREATININE: 0.8 mg/dL (ref 0.6–1.1)
Calcium: 9.3 mg/dL (ref 8.4–10.4)
Chloride: 110 mEq/L — ABNORMAL HIGH (ref 98–109)
GLUCOSE: 90 mg/dL (ref 70–140)
POTASSIUM: 3.9 meq/L (ref 3.5–5.1)
Sodium: 144 mEq/L (ref 136–145)
Total Bilirubin: 0.32 mg/dL (ref 0.20–1.20)
Total Protein: 6.6 g/dL (ref 6.4–8.3)

## 2014-03-29 LAB — CBC WITH DIFFERENTIAL/PLATELET
BASO%: 1.2 % (ref 0.0–2.0)
Basophils Absolute: 0 10*3/uL (ref 0.0–0.1)
EOS%: 3 % (ref 0.0–7.0)
Eosinophils Absolute: 0.1 10*3/uL (ref 0.0–0.5)
HEMATOCRIT: 33 % — AB (ref 34.8–46.6)
HGB: 10.8 g/dL — ABNORMAL LOW (ref 11.6–15.9)
LYMPH#: 1.3 10*3/uL (ref 0.9–3.3)
LYMPH%: 34.3 % (ref 14.0–49.7)
MCH: 30.9 pg (ref 25.1–34.0)
MCHC: 32.8 g/dL (ref 31.5–36.0)
MCV: 94.1 fL (ref 79.5–101.0)
MONO#: 0.3 10*3/uL (ref 0.1–0.9)
MONO%: 8.9 % (ref 0.0–14.0)
NEUT#: 2 10*3/uL (ref 1.5–6.5)
NEUT%: 52.6 % (ref 38.4–76.8)
Platelets: 167 10*3/uL (ref 145–400)
RBC: 3.51 10*6/uL — ABNORMAL LOW (ref 3.70–5.45)
RDW: 17.5 % — ABNORMAL HIGH (ref 11.2–14.5)
WBC: 3.7 10*3/uL — ABNORMAL LOW (ref 3.9–10.3)

## 2014-03-29 LAB — CA 125: CA 125: 513.2 U/mL — ABNORMAL HIGH (ref 0.0–30.2)

## 2014-03-29 MED ORDER — ONDANSETRON 8 MG/50ML IVPB (CHCC)
8.0000 mg | Freq: Once | INTRAVENOUS | Status: AC
  Administered 2014-03-29: 8 mg via INTRAVENOUS

## 2014-03-29 MED ORDER — DEXAMETHASONE SODIUM PHOSPHATE 10 MG/ML IJ SOLN
4.0000 mg | Freq: Once | INTRAMUSCULAR | Status: AC
  Administered 2014-03-29: 4 mg via INTRAVENOUS

## 2014-03-29 MED ORDER — DEXAMETHASONE SODIUM PHOSPHATE 10 MG/ML IJ SOLN
INTRAMUSCULAR | Status: AC
  Filled 2014-03-29: qty 1

## 2014-03-29 MED ORDER — SODIUM CHLORIDE 0.9 % IV SOLN
80.0000 mg/m2 | Freq: Once | INTRAVENOUS | Status: AC
  Administered 2014-03-29: 120 mg via INTRAVENOUS
  Filled 2014-03-29: qty 20

## 2014-03-29 MED ORDER — DIPHENHYDRAMINE HCL 50 MG/ML IJ SOLN
INTRAMUSCULAR | Status: AC
  Filled 2014-03-29: qty 1

## 2014-03-29 MED ORDER — FAMOTIDINE IN NACL 20-0.9 MG/50ML-% IV SOLN
20.0000 mg | Freq: Once | INTRAVENOUS | Status: AC
  Administered 2014-03-29: 20 mg via INTRAVENOUS

## 2014-03-29 MED ORDER — FAMOTIDINE IN NACL 20-0.9 MG/50ML-% IV SOLN
INTRAVENOUS | Status: AC
  Filled 2014-03-29: qty 50

## 2014-03-29 MED ORDER — SODIUM CHLORIDE 0.9 % IJ SOLN
10.0000 mL | INTRAMUSCULAR | Status: DC | PRN
  Administered 2014-03-29: 10 mL
  Filled 2014-03-29: qty 10

## 2014-03-29 MED ORDER — HEPARIN SOD (PORK) LOCK FLUSH 100 UNIT/ML IV SOLN
500.0000 [IU] | Freq: Once | INTRAVENOUS | Status: AC | PRN
  Administered 2014-03-29: 500 [IU]
  Filled 2014-03-29: qty 5

## 2014-03-29 MED ORDER — DIPHENHYDRAMINE HCL 50 MG/ML IJ SOLN
25.0000 mg | Freq: Once | INTRAMUSCULAR | Status: AC
  Administered 2014-03-29: 25 mg via INTRAVENOUS

## 2014-03-29 MED ORDER — SODIUM CHLORIDE 0.9 % IV SOLN
Freq: Once | INTRAVENOUS | Status: AC
  Administered 2014-03-29: 16:00:00 via INTRAVENOUS

## 2014-03-29 NOTE — Progress Notes (Deleted)
Per Lelan Pons in lab, Creatinine 0.78

## 2014-03-29 NOTE — Patient Instructions (Addendum)
[facility] Discharge Instructions for Patients Receiving Chemotherapy  Today you received the following chemotherapy agents: taxol  To help prevent nausea and vomiting after your treatment, we encourage you to take your nausea medication.    If you develop nausea and vomiting that is not controlled by your nausea medication, call the clinic.   BELOW ARE SYMPTOMS THAT SHOULD BE REPORTED IMMEDIATELY:  *FEVER GREATER THAN 100.5 F  *CHILLS WITH OR WITHOUT FEVER  NAUSEA AND VOMITING THAT IS NOT CONTROLLED WITH YOUR NAUSEA MEDICATION  *UNUSUAL SHORTNESS OF BREATH  *UNUSUAL BRUISING OR BLEEDING  TENDERNESS IN MOUTH AND THROAT WITH OR WITHOUT PRESENCE OF ULCERS  *URINARY PROBLEMS  *BOWEL PROBLEMS  UNUSUAL RASH Items with * indicate a potential emergency and should be followed up as soon as possible.  Feel free to call the clinic should you have any questions or concerns. The clinic phone number is [phone].

## 2014-04-10 ENCOUNTER — Ambulatory Visit (HOSPITAL_BASED_OUTPATIENT_CLINIC_OR_DEPARTMENT_OTHER): Payer: Medicare Other | Admitting: Oncology

## 2014-04-10 VITALS — BP 144/68 | HR 75 | Temp 98.0°F | Resp 20 | Wt 128.3 lb

## 2014-04-10 DIAGNOSIS — C569 Malignant neoplasm of unspecified ovary: Secondary | ICD-10-CM

## 2014-04-10 DIAGNOSIS — R0981 Nasal congestion: Secondary | ICD-10-CM

## 2014-04-10 DIAGNOSIS — L719 Rosacea, unspecified: Secondary | ICD-10-CM

## 2014-04-10 NOTE — Progress Notes (Signed)
ID: Cassandra Nichols   DOB: [birth year]  MR#: [id number]  PYK#:[id number]   PCP:  Marjorie Smolder, MD GYN:  [name], MD OTHER:  [name], MD  CHIEF COMPLAINT:  Recurrent Ovarian Cancer  HISTORY OF PRESENT ILLNESS: Cassandra Nichols was initially diagnosed with ovarian cancer in July of 2002. She underwent debulking surgery at that time for what proved to be a 4 of 18 lymph nodes positive disease. She was initially treated with 6 cycles of paclitaxel and carboplatin adjuvantly, completed in December of 2002.  Patient has had multiple recurrences since December of 2002, and has been treated with multiple agents as noted below.  INTERVAL HISTORY: Cassandra Nichols returns today for followup of her ovarian cancer.  She continues to receive treatment with paclitaxel, given on days 1 and 8 of each 21 day cycle. Today is day 20 cycle 5.(She has only received 8 doses so far because of Ms. Treatments)   REVIEW OF SYSTEMS: Cassandra Nichols continues to work full-time. This tires her out that makes her feel good and of course provides her with some economic stability. She has been watching about the "numbness in the toes", and tells me this actually preceded her Taxol treatments and has not clearly increased. She thinks there may be a little bit of numbness in her fingertips. She's noted that her cheeks have become red and she does not think this is rosacea, which she has had before. She thinks is due to a reaction to chemotherapy and some light. She is not exercising regularly because Silver sneakers his daytime and she can get there before 5. She does walk may be 5 minutes a day. Otherwise a detailed review of systems today was noncontributory  PAST MEDICAL HISTORY: Past Medical History  Diagnosis Date  . Shingles   . Ovarian cancer 11/17/2011  . Cancer     PAST SURGICAL HISTORY: Past Surgical History  Procedure Laterality Date  . Ganglion cyst excision Left     Wrist    FAMILY HISTORY Family History  Problem  Relation Age of Onset  . Hypertension Mother   . GER disease Mother   . Cancer Father     Cancer (stomach)    GYNECOLOGIC HISTORY: GXP2  SOCIAL HISTORY: (Updated 03/08/2014) Cassandra Nichols lives alone. She works temporary jobs, sometimes as a Oceanographer, and sometimes grading tests.  As of March 2015, she is working full-time hours at the [employer] center. She's primary caregiver for her and who is currently 88 and lives in [place].  Her daughter Cassandra Nichols lives in [place] and is Mudlogger of the epidemiology and [place] there; she is married with 2 children. Son  Cassandra Nichols lives in [place], [place]. He has 4 children of his own.His wife is a Marine scientist  ADVANCED DIRECTIVES: in place  HEALTH MAINTENANCE:  (Updated  03/08/2014) History  Substance Use Topics  . Smoking status: Former Research scientist (life sciences)  . Smokeless tobacco: Never Used  . Alcohol Use: No     Comment: Occasionally     Colonoscopy: Not on file  PAP:   Bone density: Not on file  Lipid panel: Not on file  Mammogram:  Feb 2103, [facility]   Allergies  Allergen Reactions  . Penicillins Rash    Current Outpatient Prescriptions  Medication Sig Dispense Refill  . CYANOCOBALAMIN PO Take 1 tablet by mouth every morning.      . fluticasone (FLONASE) 50 MCG/ACT nasal spray Place 1 spray into both nostrils daily.  16 g  2  .  loratadine (CLARITIN) 10 MG tablet Take 10 mg by mouth daily as needed for allergies.      . Melatonin 3 MG TABS Take 3 mg by mouth at bedtime as needed.      Marland Kitchen acyclovir (ZOVIRAX) 400 MG tablet       . Alum & Mag Hydroxide-Simeth (MAGIC MOUTHWASH W/LIDOCAINE) SOLN Take 5 mLs by mouth 4 (four) times daily as needed.  240 mL  2  . ibuprofen (ADVIL,MOTRIN) 800 MG tablet Take 800 mg by mouth every 8 (eight) hours as needed.        . ondansetron (ZOFRAN) 8 MG tablet Take 1 tablet (8 mg total) by mouth 2 (two) times daily. Take two times a day starting the day after chemo for 2 days. Then take two times a day as needed for nausea  or vomiting.  30 tablet  1  . prochlorperazine (COMPAZINE) 10 MG tablet Take 1 tablet (10 mg total) by mouth every 6 (six) hours as needed (Nausea or vomiting).  30 tablet  1   No current facility-administered medications for this visit.    OBJECTIVE: Middle-aged white Nichols who appears stated age 73 Vitals:   04/10/14 1700  BP: 144/68  Pulse: 75  Temp: 98 F (36.7 C)  Resp: 20     Body mass index is 25.9 kg/(m^2).    ECOG FS: 1 Filed Weights   04/10/14 1700  Weight: 128 lb 4.9 oz (58.2 kg)   Normocephalic; wearing a flattering wig Sclerae unicteric, pupils equal, round and reactive Oropharynx clear and moist--erythematous nonpalpable rash on both cheeks No cervical or supraclavicular adenopathy Lungs no rales or rhonchi Heart regular rate and rhythm Abd soft, nontender, positive bowel sounds, no  shifting dullness, no masses palpated MSK no focal spinal tenderness, no upper extremity lymphedema Neuro: nonfocal, well oriented, appropriate affect Breasts: Deferred   LAB RESULTS: Results for Cassandra, Nichols (MRN [id number]) as of 04/10/2014 17:14  Ref. Range 12/24/2013 12:37 01/18/2014 15:48 02/08/2014 11:17 03/08/2014 14:37 03/29/2014 15:33  CA 125 Latest Range: 0.0-30.2 U/mL 1772.3 (H) 1212.0 (H) 981.7 (H) 575.8 (H) 513.2 (H)    Lab Results  Component Value Date   WBC 3.7* 03/29/2014   NEUTROABS 2.0 03/29/2014   HGB 10.8* 03/29/2014   HCT 33.0* 03/29/2014   MCV 94.1 03/29/2014   PLT 167 03/29/2014      Chemistry      Component Value Date/Time   NA 144 03/29/2014 1532   NA 140 07/04/2012 0832   K 3.9 03/29/2014 1532   K 4.2 07/04/2012 0832   CL 106 05/09/2013 1304   CL 106 07/04/2012 0832   CO2 25 03/29/2014 1532   CO2 26 07/04/2012 0832   BUN 17.8 03/29/2014 1532   BUN 17 07/04/2012 0832   CREATININE 0.8 03/29/2014 1532   CREATININE 0.82 07/04/2012 0832      Component Value Date/Time   CALCIUM 9.3 03/29/2014 1532   CALCIUM 9.2 07/04/2012 0832   ALKPHOS 86 03/29/2014 1532    ALKPHOS 71 07/04/2012 0832   AST 13 03/29/2014 1532   AST 18 07/04/2012 0832   ALT <6 03/29/2014 1532   ALT 12 07/04/2012 0832   BILITOT 0.32 03/29/2014 1532   BILITOT 0.7 07/04/2012 0832       STUDIES: No results found.  ASSESSMENT: 73 y.o.  Cassandra Nichols (1)  with a history of ovarian cancer dating back to July 2002 when she underwent debulking surgery for what proved to be 4/18 lymph node  positive disease.   (2)  She received 6 cycles of paclitaxel/carboplatin adjuvantly completed in December 2002.   (3)  She had her first recurrence in December 2005 treated with paclitaxel;   (4)  second recurrence May 2007 treated with [place] and paclitaxel;   (5)  third recurrence January 2010 treated with topotecan and Gemzar;   (6)  fourth recurrence January 2012 treated with single-agent carboplatin (January to August 2012) with initial response, then plateau; with progression January 2013 (less than 6 months after last carbo dose) treated with single-agent gemzar given Q2 weeks (January to April 2013), with evidence of progression  (7) Doxil strartedJuly 2013 6, with evidence of initial response, and fair tolerance. Held between May and August 2014 due to patient's work schedule and scheduling issues.  Resumed treatment as of 08/24/2013, with some delays secondary to the patient's schedule. As of early December 2014, her total cumulative dose of Doxil reached 560 mg per meter square. In addition there was also an upward trend of the CA 125. Doxil was discontinued (last dose 10/30/2013). Echocardiogram on 11/14/2013 showed a well preserved ejection fraction of 55-60%.  (8) being treated with single agent paclitaxel, given on days one and 8 of each 21 day cycle, first dose on 12/27/2013.   PLAN:  Cassandra Nichols is doing remarkably well, enjoying her job. We again discussed the problem of neuropathy in detail. She has a little bit of numbness in her toes and possibly in her fingertips. This clearly does  not affect her activities of daily living. She tells me it preceded the Taxol and it is not clearly any worse than it was before.  We discussed her rosacea. She thinks this may be related to sun reaction with chemotherapy and did not want any intervention other than wearing sunscreen, which is going to try. If that doesn't clear then of course we can try antibiotics.  She will be treated this Friday and then again a week from then. She will see our covering provider on the first and again on May 15. She will see me a couple of days before her may 22nd treatment. We will continue to follow her CA 125 and continue treatment to maximal response or maximal tolerance.  Chauncey Cruel, MD Medical oncology    04/10/2014

## 2014-04-12 ENCOUNTER — Other Ambulatory Visit (HOSPITAL_BASED_OUTPATIENT_CLINIC_OR_DEPARTMENT_OTHER): Payer: Medicare Other

## 2014-04-12 ENCOUNTER — Other Ambulatory Visit: Payer: Self-pay | Admitting: Medical Oncology

## 2014-04-12 ENCOUNTER — Telehealth: Payer: Self-pay | Admitting: Oncology

## 2014-04-12 ENCOUNTER — Ambulatory Visit (HOSPITAL_BASED_OUTPATIENT_CLINIC_OR_DEPARTMENT_OTHER): Payer: Medicare Other

## 2014-04-12 VITALS — BP 152/61 | HR 77 | Temp 97.4°F | Resp 18

## 2014-04-12 DIAGNOSIS — C569 Malignant neoplasm of unspecified ovary: Secondary | ICD-10-CM

## 2014-04-12 DIAGNOSIS — Z5111 Encounter for antineoplastic chemotherapy: Secondary | ICD-10-CM

## 2014-04-12 LAB — CBC WITH DIFFERENTIAL/PLATELET
BASO%: 0.8 % (ref 0.0–2.0)
BASOS ABS: 0 10*3/uL (ref 0.0–0.1)
EOS ABS: 0 10*3/uL (ref 0.0–0.5)
EOS%: 0.9 % (ref 0.0–7.0)
HCT: 34 % — ABNORMAL LOW (ref 34.8–46.6)
HGB: 11 g/dL — ABNORMAL LOW (ref 11.6–15.9)
LYMPH%: 43 % (ref 14.0–49.7)
MCH: 30.4 pg (ref 25.1–34.0)
MCHC: 32.4 g/dL (ref 31.5–36.0)
MCV: 93.8 fL (ref 79.5–101.0)
MONO#: 0.5 10*3/uL (ref 0.1–0.9)
MONO%: 15.2 % — ABNORMAL HIGH (ref 0.0–14.0)
NEUT%: 40.1 % (ref 38.4–76.8)
NEUTROS ABS: 1.3 10*3/uL — AB (ref 1.5–6.5)
Platelets: 175 10*3/uL (ref 145–400)
RBC: 3.62 10*6/uL — ABNORMAL LOW (ref 3.70–5.45)
RDW: 18.8 % — AB (ref 11.2–14.5)
WBC: 3.3 10*3/uL — ABNORMAL LOW (ref 3.9–10.3)
lymph#: 1.4 10*3/uL (ref 0.9–3.3)

## 2014-04-12 MED ORDER — FAMOTIDINE IN NACL 20-0.9 MG/50ML-% IV SOLN
INTRAVENOUS | Status: AC
  Filled 2014-04-12: qty 50

## 2014-04-12 MED ORDER — DEXTROSE 5 % IV SOLN
80.0000 mg/m2 | Freq: Once | INTRAVENOUS | Status: AC
  Administered 2014-04-12: 120 mg via INTRAVENOUS
  Filled 2014-04-12: qty 20

## 2014-04-12 MED ORDER — SODIUM CHLORIDE 0.9 % IJ SOLN
10.0000 mL | INTRAMUSCULAR | Status: DC | PRN
  Administered 2014-04-12: 10 mL
  Filled 2014-04-12: qty 10

## 2014-04-12 MED ORDER — DIPHENHYDRAMINE HCL 50 MG/ML IJ SOLN
25.0000 mg | Freq: Once | INTRAMUSCULAR | Status: AC
  Administered 2014-04-12: 25 mg via INTRAVENOUS

## 2014-04-12 MED ORDER — ONDANSETRON 8 MG/NS 50 ML IVPB
INTRAVENOUS | Status: AC
  Filled 2014-04-12: qty 8

## 2014-04-12 MED ORDER — SODIUM CHLORIDE 0.9 % IV SOLN
Freq: Once | INTRAVENOUS | Status: AC
  Administered 2014-04-12: 17:00:00 via INTRAVENOUS

## 2014-04-12 MED ORDER — DEXAMETHASONE SODIUM PHOSPHATE 10 MG/ML IJ SOLN
4.0000 mg | Freq: Once | INTRAMUSCULAR | Status: AC
  Administered 2014-04-12: 4 mg via INTRAVENOUS

## 2014-04-12 MED ORDER — DEXAMETHASONE SODIUM PHOSPHATE 10 MG/ML IJ SOLN
INTRAMUSCULAR | Status: AC
  Filled 2014-04-12: qty 1

## 2014-04-12 MED ORDER — HEPARIN SOD (PORK) LOCK FLUSH 100 UNIT/ML IV SOLN
500.0000 [IU] | Freq: Once | INTRAVENOUS | Status: AC | PRN
  Administered 2014-04-12: 500 [IU]
  Filled 2014-04-12: qty 5

## 2014-04-12 MED ORDER — ONDANSETRON 8 MG/50ML IVPB (CHCC)
8.0000 mg | Freq: Once | INTRAVENOUS | Status: AC
  Administered 2014-04-12: 8 mg via INTRAVENOUS

## 2014-04-12 MED ORDER — DIPHENHYDRAMINE HCL 50 MG/ML IJ SOLN
INTRAMUSCULAR | Status: AC
  Filled 2014-04-12: qty 1

## 2014-04-12 MED ORDER — FAMOTIDINE IN NACL 20-0.9 MG/50ML-% IV SOLN
20.0000 mg | Freq: Once | INTRAVENOUS | Status: AC
  Administered 2014-04-12: 20 mg via INTRAVENOUS

## 2014-04-12 NOTE — Telephone Encounter (Signed)
Sent dr Jana Hakim an email regarding this pt's appt on 05/03/2014.

## 2014-04-12 NOTE — Progress Notes (Signed)
Per Dr Jana Hakim it is okay to treat pt today with taxol and todays CBC with diff results.

## 2014-04-12 NOTE — Patient Instructions (Signed)
[facility] Discharge Instructions for Patients Receiving Chemotherapy  Today you received the following chemotherapy agents: Taxol  To help prevent nausea and vomiting after your treatment, we encourage you to take your nausea medication as prescribed by your physician.  If you develop nausea and vomiting that is not controlled by your nausea medication, call the clinic.   BELOW ARE SYMPTOMS THAT SHOULD BE REPORTED IMMEDIATELY:  *FEVER GREATER THAN 100.5 F  *CHILLS WITH OR WITHOUT FEVER  NAUSEA AND VOMITING THAT IS NOT CONTROLLED WITH YOUR NAUSEA MEDICATION  *UNUSUAL SHORTNESS OF BREATH  *UNUSUAL BRUISING OR BLEEDING  TENDERNESS IN MOUTH AND THROAT WITH OR WITHOUT PRESENCE OF ULCERS  *URINARY PROBLEMS  *BOWEL PROBLEMS  UNUSUAL RASH Items with * indicate a potential emergency and should be followed up as soon as possible.  Feel free to call the clinic you have any questions or concerns. The clinic phone number is [phone].    

## 2014-04-15 ENCOUNTER — Telehealth: Payer: Self-pay | Admitting: *Deleted

## 2014-04-15 NOTE — Telephone Encounter (Signed)
Per staff message and POF I have scheduled appts.  JMW  

## 2014-04-15 NOTE — Telephone Encounter (Signed)
Kaydince Towles called to this RN to state she would like to " take a mini break from therapy " for the next two scheduled chemo appointments. " Dr Jana Hakim and I had discussed this - but we were hoping to push thru a little more "  Donetta Isaza stated she developed some GI discomfort late Saturday into early am Sunday .  She is planning on visiting grandson this coming weekend and does not want to develop symptoms that would interfere with visit.   Per call appts for 5/1 and 5/8 will be cancelled.  Appointment for 5/20 with MD will be kept and lab will be added at 415pm.  This note will be given to MD for any additional recommendations.

## 2014-04-19 ENCOUNTER — Ambulatory Visit: Payer: Medicare Other

## 2014-04-19 ENCOUNTER — Other Ambulatory Visit: Payer: Medicare Other

## 2014-04-24 ENCOUNTER — Telehealth: Payer: Self-pay | Admitting: Oncology

## 2014-04-24 NOTE — Telephone Encounter (Signed)
lmonvm advising the pt of her appts on 05/03/2014.

## 2014-04-28 ENCOUNTER — Other Ambulatory Visit: Payer: Self-pay | Admitting: Oncology

## 2014-05-03 ENCOUNTER — Ambulatory Visit: Payer: Medicare Other

## 2014-05-03 ENCOUNTER — Other Ambulatory Visit: Payer: Medicare Other

## 2014-05-08 ENCOUNTER — Ambulatory Visit (HOSPITAL_BASED_OUTPATIENT_CLINIC_OR_DEPARTMENT_OTHER): Payer: Medicare Other | Admitting: Oncology

## 2014-05-08 VITALS — BP 147/76 | HR 91 | Temp 98.3°F | Resp 20

## 2014-05-08 DIAGNOSIS — C569 Malignant neoplasm of unspecified ovary: Secondary | ICD-10-CM

## 2014-05-08 DIAGNOSIS — R209 Unspecified disturbances of skin sensation: Secondary | ICD-10-CM

## 2014-05-08 NOTE — Progress Notes (Signed)
ID: Cassandra Nichols   DOB: June 23, 1941  MR#: [id number]  SWN#:[id number]   PCP:  Marjorie Smolder, MD GYN:  [name], MD OTHER:  [name], MD  CHIEF COMPLAINT:  Recurrent Ovarian Cancer  HISTORY OF PRESENT ILLNESS: Cassandra Nichols was initially diagnosed with ovarian cancer in July of 2002. She underwent debulking surgery at that time for what proved to be a 4 of 18 lymph nodes positive disease. She was initially treated with 6 cycles of paclitaxel and carboplatin adjuvantly, completed in December of 2002.  Patient has had multiple recurrences since December of 2002, and has been treated with multiple agents as noted below.  INTERVAL HISTORY: [name] returns today for followup of her ovarian cancer.  She had been scheduled to receive her 12th dose of paclitaxel on 04/19/2014, but needed "break" so the 04/19/2014 and 05/03/2014 treatments have been held. She is here today to decide whether she wants to resume her chemotherapy at this point or take a more extended break.    REVIEW OF SYSTEMS: Cassandra Nichols is doing "very well" at this time. She finds to work she is doing not stressful. This will continue through mid August. She got cards and some gifts for Mother's Day from her children and grandchildren. She is doing some gardening and starting to walk a little bit. She still has numbness in her toes. There is no tingling. She knows were her feet are. The numbness in her fingers is pretty much resolved. She has no problems with bowel movements and she has good bladder control. Just towards in the lateral aspect of the third right. Aside from these issues a detailed review of systems today was noncontributory  PAST MEDICAL HISTORY: Past Medical History  Diagnosis Date  . Shingles   . Ovarian cancer 11/17/2011  . Cancer     PAST SURGICAL HISTORY: Past Surgical History  Procedure Laterality Date  . Ganglion cyst excision Left     Wrist    FAMILY HISTORY Family History  Problem Relation Age of  Onset  . Hypertension Mother   . GER disease Mother   . Cancer Father     Cancer (stomach)    GYNECOLOGIC HISTORY: GXP2  SOCIAL HISTORY: (Updated 03/08/2014) Cassandra Nichols lives alone. She works temporary jobs, sometimes as a Oceanographer, and sometimes grading tests.  As of March 2015, she is working full-time hours at the [employer] center. She's primary caregiver for her and who is currently 64 and lives in [place].  Her daughter [name] lives in [place] and is Mudlogger of the epidemiology and [place] there; she is married with 2 children. Son  [name] lives in [place], [place]. He has 4 children of his own.His wife is a Marine scientist  ADVANCED DIRECTIVES: in place  HEALTH MAINTENANCE:  (Updated  03/08/2014) History  Substance Use Topics  . Smoking status: Former Research scientist (life sciences)  . Smokeless tobacco: Never Used  . Alcohol Use: No     Comment: Occasionally     Colonoscopy: Not on file  PAP:   Bone density: Not on file  Lipid panel: Not on file  Mammogram:  Feb 2103, [facility]   Allergies  Allergen Reactions  . Penicillins Rash    Current Outpatient Prescriptions  Medication Sig Dispense Refill  . acyclovir (ZOVIRAX) 400 MG tablet       . Alum & Mag Hydroxide-Simeth (MAGIC MOUTHWASH W/LIDOCAINE) SOLN Take 5 mLs by mouth 4 (four) times daily as needed.  240 mL  2  . CYANOCOBALAMIN  PO Take 1 tablet by mouth every morning.      . fluticasone (FLONASE) 50 MCG/ACT nasal spray Place 1 spray into both nostrils daily.  16 g  2  . ibuprofen (ADVIL,MOTRIN) 800 MG tablet Take 800 mg by mouth every 8 (eight) hours as needed.        . loratadine (CLARITIN) 10 MG tablet Take 10 mg by mouth daily as needed for allergies.      . Melatonin 3 MG TABS Take 3 mg by mouth at bedtime as needed.      . ondansetron (ZOFRAN) 8 MG tablet Take 1 tablet (8 mg total) by mouth 2 (two) times daily. Take two times a day starting the day after chemo for 2 days. Then take two times a day as needed for nausea or vomiting.  30  tablet  1  . prochlorperazine (COMPAZINE) 10 MG tablet Take 1 tablet (10 mg total) by mouth every 6 (six) hours as needed (Nausea or vomiting).  30 tablet  1   No current facility-administered medications for this visit.    OBJECTIVE: Middle-aged white woman in no acute distress Filed Vitals:   05/08/14 1706  BP: 147/76  Pulse: 91  Temp: 98.3 F (36.8 C)  Resp: 20     There is no weight on file to calculate BMI.    ECOG FS: 0  Normocephalic; week in place Sclerae unicteric, EOMs intact Oropharynx clear and moist No cervical or supraclavicular adenopathy Lungs no rales or rhonchi Heart regular rate and rhythm Abd soft, nontender, positive bowel sounds, no masses palpated MSK no focal spinal tenderness, no upper extremity lymphedema Neuro: nonfocal, well oriented, positive affect Breasts: Deferred   LAB RESULTS: Results for SHAN, PADGETT (MRN [id number]) as of 04/10/2014 17:14  Ref. Range 12/24/2013 12:37 01/18/2014 15:48 02/08/2014 11:17 03/08/2014 14:37 03/29/2014 15:33  CA 125 Latest Range: 0.0-30.2 U/mL 1772.3 (H) 1212.0 (H) 981.7 (H) 575.8 (H) 513.2 (H)    Lab Results  Component Value Date   WBC 3.3* 04/12/2014   NEUTROABS 1.3* 04/12/2014   HGB 11.0* 04/12/2014   HCT 34.0* 04/12/2014   MCV 93.8 04/12/2014   PLT 175 04/12/2014      Chemistry      Component Value Date/Time   NA 144 03/29/2014 1532   NA 140 07/04/2012 0832   K 3.9 03/29/2014 1532   K 4.2 07/04/2012 0832   CL 106 05/09/2013 1304   CL 106 07/04/2012 0832   CO2 25 03/29/2014 1532   CO2 26 07/04/2012 0832   BUN 17.8 03/29/2014 1532   BUN 17 07/04/2012 0832   CREATININE 0.8 03/29/2014 1532   CREATININE 0.82 07/04/2012 0832      Component Value Date/Time   CALCIUM 9.3 03/29/2014 1532   CALCIUM 9.2 07/04/2012 0832   ALKPHOS 86 03/29/2014 1532   ALKPHOS 71 07/04/2012 0832   AST 13 03/29/2014 1532   AST 18 07/04/2012 0832   ALT <6 03/29/2014 1532   ALT 12 07/04/2012 0832   BILITOT 0.32 03/29/2014 1532   BILITOT 0.7  07/04/2012 0832       STUDIES: No results found.  ASSESSMENT: 73 y.o.  [place] woman (1)  with a history of ovarian cancer dating back to July 2002 when she underwent debulking surgery for what proved to be 4/18 lymph node positive disease.   (2)  She received 6 cycles of paclitaxel/carboplatin adjuvantly completed in December 2002.   (3)  She had her first recurrence in December 2005  treated with paclitaxel;   (4)  second recurrence May 2007 treated with [place] and paclitaxel;   (5)  third recurrence January 2010 treated with topotecan and Gemzar;   (6)  fourth recurrence January 2012 treated with single-agent carboplatin (January to August 2012) with initial response, then plateau; with progression January 2013 (less than 6 months after last carbo dose) treated with single-agent gemzar given Q2 weeks (January to April 2013), with evidence of progression  (7) Doxil strartedJuly 2013 6, with evidence of initial response, and fair tolerance. Held between May and August 2014 due to patient's work schedule and scheduling issues.  Resumed treatment as of 08/24/2013, with some delays secondary to the patient's schedule. As of early December 2014, her total cumulative dose of Doxil reached 560 mg per meter square. In addition there was also an upward trend of the CA 125. Doxil was discontinued (last dose 10/30/2013). Echocardiogram on 11/14/2013 showed a well preserved ejection fraction of 55-60%.  (8)  treated with single agent paclitaxel, given on days one and 8 of each 21 day cycle, first dose on 12/27/2013, 11th and last dose 04/12/2014; with continuing response, but mild to moderate toxicity   PLAN:  Hudsyn Barich did very well with the Taxol. We do have to worry about a continuing numbness over the toes. It is favorable that the fingertips have cleared. I think this would be a good time for her to take a "treatment medication", and that is her feeling as well.  Accordingly we will flush her  port on June 5, July 17, and August 21. She will see me late August and assuming her CA 125 has not significantly started to rise, she is hoping to be able to spend a couple of weeks in [place] early September. We will probably resume treatments sometime late September or October. Whether or not we go back to paclitaxel will depend on what happens to her neuropathy  Michalla Ringer is a good understanding of the overall plan. She agrees with it. She knows the goal of treatment in her case is control. She will call with any problems that may develop before next visit here.  Chauncey Cruel, MD Medical oncology    05/08/2014

## 2014-05-09 ENCOUNTER — Telehealth: Payer: Self-pay | Admitting: *Deleted

## 2014-05-09 ENCOUNTER — Telehealth: Payer: Self-pay | Admitting: Oncology

## 2014-05-09 ENCOUNTER — Other Ambulatory Visit: Payer: Self-pay | Admitting: *Deleted

## 2014-05-09 NOTE — Telephone Encounter (Signed)
Per staff message and POF I have scheduled appts.  JMW  

## 2014-05-09 NOTE — Telephone Encounter (Signed)
miskae 

## 2014-05-09 NOTE — Telephone Encounter (Signed)
per pof to sch pt appt-cld pt to adv of appt-pt req to mai copy to her-will mail out

## 2014-05-10 ENCOUNTER — Ambulatory Visit: Payer: Medicare Other

## 2014-05-10 ENCOUNTER — Other Ambulatory Visit: Payer: Medicare Other

## 2014-05-14 MED ORDER — DIPHENHYDRAMINE HCL 50 MG/ML IJ SOLN
INTRAMUSCULAR | Status: AC
  Filled 2014-05-14: qty 1

## 2014-05-14 MED ORDER — FAMOTIDINE IN NACL 20-0.9 MG/50ML-% IV SOLN
INTRAVENOUS | Status: AC
  Filled 2014-05-14: qty 50

## 2014-05-14 MED ORDER — DEXAMETHASONE SODIUM PHOSPHATE 20 MG/5ML IJ SOLN
INTRAMUSCULAR | Status: AC
  Filled 2014-05-14: qty 5

## 2014-05-14 MED ORDER — ACETAMINOPHEN 325 MG PO TABS
ORAL_TABLET | ORAL | Status: AC
  Filled 2014-05-14: qty 2

## 2014-05-14 MED ORDER — ONDANSETRON 8 MG/NS 50 ML IVPB
INTRAVENOUS | Status: AC
  Filled 2014-05-14: qty 8

## 2014-05-17 ENCOUNTER — Ambulatory Visit: Payer: Medicare Other

## 2014-05-24 ENCOUNTER — Ambulatory Visit (HOSPITAL_BASED_OUTPATIENT_CLINIC_OR_DEPARTMENT_OTHER): Payer: Medicare Other

## 2014-05-24 ENCOUNTER — Other Ambulatory Visit: Payer: Medicare Other

## 2014-05-24 VITALS — BP 152/64 | HR 69

## 2014-05-24 DIAGNOSIS — C569 Malignant neoplasm of unspecified ovary: Secondary | ICD-10-CM

## 2014-05-24 DIAGNOSIS — Z452 Encounter for adjustment and management of vascular access device: Secondary | ICD-10-CM

## 2014-05-24 DIAGNOSIS — Z95828 Presence of other vascular implants and grafts: Secondary | ICD-10-CM

## 2014-05-24 LAB — COMPREHENSIVE METABOLIC PANEL (CC13)
ALT: 8 U/L (ref 0–55)
ANION GAP: 13 meq/L — AB (ref 3–11)
AST: 14 U/L (ref 5–34)
Albumin: 3.5 g/dL (ref 3.5–5.0)
Alkaline Phosphatase: 71 U/L (ref 40–150)
BILIRUBIN TOTAL: 0.36 mg/dL (ref 0.20–1.20)
BUN: 15.4 mg/dL (ref 7.0–26.0)
CO2: 21 meq/L — AB (ref 22–29)
CREATININE: 0.7 mg/dL (ref 0.6–1.1)
Calcium: 9.2 mg/dL (ref 8.4–10.4)
Chloride: 108 mEq/L (ref 98–109)
Glucose: 86 mg/dl (ref 70–140)
Potassium: 4.1 mEq/L (ref 3.5–5.1)
Sodium: 142 mEq/L (ref 136–145)
Total Protein: 6.3 g/dL — ABNORMAL LOW (ref 6.4–8.3)

## 2014-05-24 LAB — CBC WITH DIFFERENTIAL/PLATELET
BASO%: 1.2 % (ref 0.0–2.0)
Basophils Absolute: 0.1 10*3/uL (ref 0.0–0.1)
EOS%: 1.3 % (ref 0.0–7.0)
Eosinophils Absolute: 0.1 10*3/uL (ref 0.0–0.5)
HCT: 35.3 % (ref 34.8–46.6)
HGB: 11.4 g/dL — ABNORMAL LOW (ref 11.6–15.9)
LYMPH%: 32.1 % (ref 14.0–49.7)
MCH: 30.2 pg (ref 25.1–34.0)
MCHC: 32.4 g/dL (ref 31.5–36.0)
MCV: 93.2 fL (ref 79.5–101.0)
MONO#: 0.4 10*3/uL (ref 0.1–0.9)
MONO%: 8.6 % (ref 0.0–14.0)
NEUT#: 2.9 10*3/uL (ref 1.5–6.5)
NEUT%: 56.8 % (ref 38.4–76.8)
Platelets: 155 10*3/uL (ref 145–400)
RBC: 3.79 10*6/uL (ref 3.70–5.45)
RDW: 17.7 % — ABNORMAL HIGH (ref 11.2–14.5)
WBC: 5.1 10*3/uL (ref 3.9–10.3)
lymph#: 1.6 10*3/uL (ref 0.9–3.3)

## 2014-05-24 LAB — CA 125: CA 125: 816.3 U/mL — AB (ref 0.0–30.2)

## 2014-05-24 MED ORDER — HEPARIN SOD (PORK) LOCK FLUSH 100 UNIT/ML IV SOLN
500.0000 [IU] | Freq: Once | INTRAVENOUS | Status: AC
  Administered 2014-05-24: 500 [IU] via INTRAVENOUS
  Filled 2014-05-24: qty 5

## 2014-05-24 MED ORDER — SODIUM CHLORIDE 0.9 % IJ SOLN
10.0000 mL | INTRAMUSCULAR | Status: DC | PRN
  Administered 2014-05-24: 10 mL via INTRAVENOUS
  Filled 2014-05-24: qty 10

## 2014-06-27 ENCOUNTER — Telehealth: Payer: Self-pay | Admitting: *Deleted

## 2014-06-27 NOTE — Telephone Encounter (Signed)
Cassandra Nichols left message stating she needs to cancel appointment on 07/08/2014- " I am on my way down to [place] to see my Aunt who turned 78 yesterday " " she had some unexpected visitors show up the other day from hospice - so I plan on staying and just take it day by day "  " the divine was at work because my job ended yesterday so I can stay as long as I need "  Cassandra Nichols states she will call upon her return to schedule any missed appointments.

## 2014-07-08 ENCOUNTER — Other Ambulatory Visit: Payer: Medicare Other

## 2014-08-09 ENCOUNTER — Ambulatory Visit: Payer: Medicare Other

## 2014-08-09 ENCOUNTER — Other Ambulatory Visit: Payer: Medicare Other

## 2014-08-09 ENCOUNTER — Other Ambulatory Visit: Payer: Self-pay

## 2014-08-14 ENCOUNTER — Other Ambulatory Visit: Payer: Self-pay | Admitting: *Deleted

## 2014-08-14 DIAGNOSIS — C569 Malignant neoplasm of unspecified ovary: Secondary | ICD-10-CM

## 2014-08-15 ENCOUNTER — Ambulatory Visit (HOSPITAL_BASED_OUTPATIENT_CLINIC_OR_DEPARTMENT_OTHER): Payer: Medicare Other | Admitting: Oncology

## 2014-08-15 ENCOUNTER — Other Ambulatory Visit: Payer: Medicare Other

## 2014-08-15 VITALS — BP 154/80 | HR 85 | Temp 98.4°F | Resp 18

## 2014-08-15 DIAGNOSIS — G620 Drug-induced polyneuropathy: Secondary | ICD-10-CM

## 2014-08-15 DIAGNOSIS — C569 Malignant neoplasm of unspecified ovary: Secondary | ICD-10-CM

## 2014-08-15 NOTE — Progress Notes (Signed)
ID: Cassandra Nichols   DOB: 07-26-1941  MR#: [id number]  DZH#:[id number]   PCP:  Marjorie Smolder, MD GYN:  [name], MD OTHER:  [name], MD  CHIEF COMPLAINT:  Recurrent Ovarian Cancer  CURRENT TREATMENT: To start cyclophosphamide and carboplatin  NOTE: THIS PATIENT REQUESTS THAT ABSOLUTELY NO NUMERIC INFORMATION BE GIVEN REGARDING ANY LAB AND PARTICULARLY DOES NOT WANT TO KNOW SPECIFIC CA-125 RESULTS  HISTORY OF PRESENT ILLNESS: Cassandra Nichols was initially diagnosed with ovarian cancer in July of 2002. She underwent debulking surgery at that time for what proved to be a 4 of 18 lymph nodes positive disease. She was initially treated with 6 cycles of paclitaxel and carboplatin adjuvantly, completed in December of 2002.  Patient has had multiple recurrences since December of 2002, and has been treated with multiple agents as noted below.  INTERVAL HISTORY: Cassandra Nichols returns today for followup of her ovarian cancer. Since her last visit here her 72 year old and Almond Lint died in [place]. Jenasis Straley has been down there for approximately the last 2 months bolus helping her through the final weeks and helping with the house cleaning and other problems following the death. Arliene Rosenow is generally doing "fine", and she is planning to be in this area for the next several months and not working, so this is a good time to get some treatment in   REVIEW OF SYSTEMS: Cassandra Nichols denies any problem with bowel movements or urine. Specifically there is no constipation and no urinary hesitancy. There is no abdominal discomfort, cramping, pain or distention. She is not exercising regularly "yet" but she is planning to resume her walking program within the next several weeks. She still has significant insomnia. She had a record assess she thinks of her left thigh shingles while she was in [place]. That has resolved. She is getting her legal will done, once to update her healthcare power of attorney and  living well and she was given the appropriate documents today to get that completed and notarized at her discretion. She has been found to have cataracts and may need surgery for that. She is also getting some dental problems taken care of. Otherwise a detailed review of systems today was noncontributory  PAST MEDICAL HISTORY: Past Medical History  Diagnosis Date  . Shingles   . Ovarian cancer 11/17/2011  . Cancer     PAST SURGICAL HISTORY: Past Surgical History  Procedure Laterality Date  . Ganglion cyst excision Left     Wrist    FAMILY HISTORY Family History  Problem Relation Age of Onset  . Hypertension Mother   . GER disease Mother   . Cancer Father     Cancer (stomach)    GYNECOLOGIC HISTORY: GXP2  SOCIAL HISTORY: (Updated 03/08/2014) Cassandra Nichols lives alone. She works temporary jobs, sometimes as a Oceanographer, and sometimes grading tests.  As of March 2015, she is working full-time hours at the [employer] center. She's primary caregiver for her and who is currently 20 and lives in [place].  Her daughter Cassandra Nichols lives in [place] and is Mudlogger of the epidemiology and [place] there; she is married with 2 children. Son  [name] lives in [place], [place]. He has 4 children of his own.His wife is a Marine scientist  ADVANCED DIRECTIVES: in place, but the patient has not been able to find the documents. At the 08/15/2014 visit she was given the appropriate living will and health Department to you documents to complete and notarize at  her discretion.  HEALTH MAINTENANCE:  (Updated  03/08/2014) History  Substance Use Topics  . Smoking status: Former Research scientist (life sciences)  . Smokeless tobacco: Never Used  . Alcohol Use: No     Comment: Occasionally     Colonoscopy: Not on file  PAP:   Bone density: Not on file  Lipid panel: Not on file  Mammogram:  Feb 2103, [facility]   Allergies  Allergen Reactions  . Penicillins Rash    Current Outpatient Prescriptions  Medication Sig Dispense Refill   . acyclovir (ZOVIRAX) 400 MG tablet       . Alum & Mag Hydroxide-Simeth (MAGIC MOUTHWASH W/LIDOCAINE) SOLN Take 5 mLs by mouth 4 (four) times daily as needed.  240 mL  2  . CYANOCOBALAMIN PO Take 1 tablet by mouth every morning.      . fluticasone (FLONASE) 50 MCG/ACT nasal spray Place 1 spray into both nostrils daily.  16 g  2  . ibuprofen (ADVIL,MOTRIN) 800 MG tablet Take 800 mg by mouth every 8 (eight) hours as needed.        . loratadine (CLARITIN) 10 MG tablet Take 10 mg by mouth daily as needed for allergies.      . Melatonin 3 MG TABS Take 3 mg by mouth at bedtime as needed.      . [DISCONTINUED] prochlorperazine (COMPAZINE) 10 MG tablet Take 1 tablet (10 mg total) by mouth every 6 (six) hours as needed (Nausea or vomiting).  30 tablet  1   No current facility-administered medications for this visit.   Facility-Administered Medications Ordered in Other Visits  Medication Dose Route Frequency Provider Last Rate Last Dose  . sodium chloride 0.9 % injection 10 mL  10 mL Intravenous PRN Chauncey Cruel, MD   10 mL at 05/24/14 1601    OBJECTIVE: Middle-aged white woman who appears stated age 72 Vitals:   08/15/14 1700  BP: 154/80  Pulse: 85  Temp: 98.4 F (36.9 C)  Resp: 18     There is no weight on file to calculate BMI.    ECOG FS: 0  Sclerae unicteric, pupils equal and reactive Oropharynx clear and slightly dry No cervical or supraclavicular adenopathy Lungs no rales or rhonchi Heart regular rate and rhythm Abd soft, nondistended, nontender, positive bowel sounds, no masses palpated MSK no focal spinal tenderness, no joint edema Neuro: nonfocal, well oriented, positive affect Breasts: Deferred    LAB RESULTS: Results for AMAKA, GLUTH (MRN [id number]) as of 08/15/2014 18:04  Ref. Range 01/18/2014 15:48 02/08/2014 11:17 03/08/2014 14:37 03/29/2014 15:33 05/24/2014 16:00  CA 125 Latest Range: 0.0-30.2 U/mL 1212.0 (H) 981.7 (H) 575.8 (H) 513.2 (H) 816.3 (H)    Lab Results   Component Value Date   WBC 5.1 05/24/2014   NEUTROABS 2.9 05/24/2014   HGB 11.4* 05/24/2014   HCT 35.3 05/24/2014   MCV 93.2 05/24/2014   PLT 155 05/24/2014      Chemistry      Component Value Date/Time   NA 142 05/24/2014 1559   NA 140 07/04/2012 0832   K 4.1 05/24/2014 1559   K 4.2 07/04/2012 0832   CL 106 05/09/2013 1304   CL 106 07/04/2012 0832   CO2 21* 05/24/2014 1559   CO2 26 07/04/2012 0832   BUN 15.4 05/24/2014 1559   BUN 17 07/04/2012 0832   CREATININE 0.7 05/24/2014 1559   CREATININE 0.82 07/04/2012 0832      Component Value Date/Time   CALCIUM 9.2 05/24/2014 1559   CALCIUM  9.2 07/04/2012 0832   ALKPHOS 71 05/24/2014 1559   ALKPHOS 71 07/04/2012 0832   AST 14 05/24/2014 1559   AST 18 07/04/2012 0832   ALT 8 05/24/2014 1559   ALT 12 07/04/2012 0832   BILITOT 0.36 05/24/2014 1559   BILITOT 0.7 07/04/2012 0832       STUDIES: No results found.  ASSESSMENT: 73 y.o.  [place] woman (1)  with a history of ovarian cancer dating back to July 2002 when she underwent debulking surgery for what proved to be 4/18 lymph node positive disease.   (2)  She received 6 cycles of paclitaxel/carboplatin adjuvantly completed in December 2002.   (3)  She had her first recurrence in December 2005 treated with paclitaxel;   (4)  second recurrence May 2007 treated with [place] and paclitaxel;   (5)  third recurrence January 2010 treated with topotecan and Gemzar;   (6)  fourth recurrence January 2012 treated with single-agent carboplatin (January to August 2012) with initial response, then plateau; with progression January 2013 (less than 6 months after last carbo dose) treated with single-agent gemzar given Q2 weeks (January to April 2013), with evidence of progression  (7) Doxil strartedJuly 2013 6, with evidence of initial response, and fair tolerance. Held between May and August 2014 due to patient's work schedule and scheduling issues.  Resumed treatment as of 08/24/2013, with some delays secondary to the  patient's schedule. As of early December 2014, her total cumulative dose of Doxil reached 560 mg per meter square. In addition there was also an upward trend of the CA 125. Doxil was discontinued (last dose 10/30/2013). Echocardiogram on 11/14/2013 showed a well preserved ejection fraction of 55-60%.  (8)  treated with single agent paclitaxel, given on days one and 8 of each 21 day cycle, first dose on 12/27/2013,with continuing response, but mild to moderate toxicity; 11th and last dose 04/12/2014    PLAN:  Adriauna Campton is finally ready to resume therapy. Because of the neuropathy issue, which has not completely resolved, we're going to stay away from taxanes. What she would like to something that will control her cancer with a minimum of side effects.  We are going to try carboplatin and cyclophosphamide, but with a very low dose of carboplatin. I think she will tolerate that well and we will follow her CA 119 closely to ascertain response. If we do document her response I would do this as 6 or 8 times before giving her another treatment break.  She has a good understanding of the overall plan. She agrees with it. She knows a goal of treatment in her case is control. She will call with any problems that may develop before her next visit here. Chauncey Cruel, MD     08/15/2014

## 2014-08-16 ENCOUNTER — Telehealth: Payer: Self-pay | Admitting: *Deleted

## 2014-08-16 ENCOUNTER — Other Ambulatory Visit: Payer: Medicare Other

## 2014-08-16 NOTE — Telephone Encounter (Signed)
Per staff message and POF I have tried to scheduled appt for 9/9/ This is a first time treatment, must be started no later then 12pm. Advised scheduler of appts. JMW

## 2014-08-19 ENCOUNTER — Telehealth: Payer: Self-pay | Admitting: Oncology

## 2014-08-19 NOTE — Telephone Encounter (Signed)
per pof to sch pt appt-sent MW emailt o sch-reply stated pt must be in trmt room before 12 for 1st chemo-changed appt to 9:45 cld and left pt message og appt times & date-will mail copy of sch

## 2014-08-20 ENCOUNTER — Telehealth: Payer: Self-pay | Admitting: *Deleted

## 2014-08-20 NOTE — Telephone Encounter (Signed)
Per staff message and POF I have scheduled appts. Advised scheduler that all I have available for 9/9 is 1245 pm. JMW

## 2014-08-21 ENCOUNTER — Telehealth: Payer: Self-pay | Admitting: Oncology

## 2014-08-21 NOTE — Telephone Encounter (Signed)
tried to contact pt to adv of appts-"number not accepting calls @ this time"mailed pt copy of sch. Per MW only time avai on trmt day for 9/9 was 12:45-had to coordinate appt accordingly

## 2014-08-28 ENCOUNTER — Ambulatory Visit: Payer: Medicare Other | Admitting: Nurse Practitioner

## 2014-08-28 ENCOUNTER — Encounter: Payer: Self-pay | Admitting: Nurse Practitioner

## 2014-08-28 ENCOUNTER — Other Ambulatory Visit: Payer: Self-pay | Admitting: Oncology

## 2014-08-28 ENCOUNTER — Ambulatory Visit (HOSPITAL_BASED_OUTPATIENT_CLINIC_OR_DEPARTMENT_OTHER): Payer: Medicare Other

## 2014-08-28 ENCOUNTER — Ambulatory Visit (HOSPITAL_BASED_OUTPATIENT_CLINIC_OR_DEPARTMENT_OTHER): Payer: Medicare Other | Admitting: Nurse Practitioner

## 2014-08-28 ENCOUNTER — Other Ambulatory Visit (HOSPITAL_BASED_OUTPATIENT_CLINIC_OR_DEPARTMENT_OTHER): Payer: Medicare Other

## 2014-08-28 ENCOUNTER — Other Ambulatory Visit: Payer: Medicare Other

## 2014-08-28 VITALS — BP 145/62 | HR 75 | Temp 98.1°F | Resp 18 | Ht 59.0 in

## 2014-08-28 DIAGNOSIS — Z5111 Encounter for antineoplastic chemotherapy: Secondary | ICD-10-CM

## 2014-08-28 DIAGNOSIS — C569 Malignant neoplasm of unspecified ovary: Secondary | ICD-10-CM

## 2014-08-28 LAB — COMPREHENSIVE METABOLIC PANEL (CC13)
ALT: 13 U/L (ref 0–55)
ANION GAP: 8 meq/L (ref 3–11)
AST: 18 U/L (ref 5–34)
Albumin: 3.6 g/dL (ref 3.5–5.0)
Alkaline Phosphatase: 83 U/L (ref 40–150)
BILIRUBIN TOTAL: 0.47 mg/dL (ref 0.20–1.20)
BUN: 19 mg/dL (ref 7.0–26.0)
CO2: 24 mEq/L (ref 22–29)
Calcium: 9.5 mg/dL (ref 8.4–10.4)
Chloride: 107 mEq/L (ref 98–109)
Creatinine: 0.8 mg/dL (ref 0.6–1.1)
GLUCOSE: 75 mg/dL (ref 70–140)
Potassium: 4 mEq/L (ref 3.5–5.1)
Sodium: 140 mEq/L (ref 136–145)
Total Protein: 6.8 g/dL (ref 6.4–8.3)

## 2014-08-28 LAB — CBC WITH DIFFERENTIAL/PLATELET
BASO%: 0.9 % (ref 0.0–2.0)
BASOS ABS: 0 10*3/uL (ref 0.0–0.1)
EOS%: 0.5 % (ref 0.0–7.0)
Eosinophils Absolute: 0 10*3/uL (ref 0.0–0.5)
HEMATOCRIT: 39.4 % (ref 34.8–46.6)
HEMOGLOBIN: 12.8 g/dL (ref 11.6–15.9)
LYMPH%: 28.2 % (ref 14.0–49.7)
MCH: 30.1 pg (ref 25.1–34.0)
MCHC: 32.4 g/dL (ref 31.5–36.0)
MCV: 92.8 fL (ref 79.5–101.0)
MONO#: 0.3 10*3/uL (ref 0.1–0.9)
MONO%: 6.8 % (ref 0.0–14.0)
NEUT#: 3.3 10*3/uL (ref 1.5–6.5)
NEUT%: 63.6 % (ref 38.4–76.8)
PLATELETS: 170 10*3/uL (ref 145–400)
RBC: 4.24 10*6/uL (ref 3.70–5.45)
RDW: 15.4 % — ABNORMAL HIGH (ref 11.2–14.5)
WBC: 5.1 10*3/uL (ref 3.9–10.3)
lymph#: 1.4 10*3/uL (ref 0.9–3.3)

## 2014-08-28 MED ORDER — HEPARIN SOD (PORK) LOCK FLUSH 100 UNIT/ML IV SOLN
500.0000 [IU] | Freq: Once | INTRAVENOUS | Status: AC | PRN
  Administered 2014-08-28: 500 [IU]
  Filled 2014-08-28: qty 5

## 2014-08-28 MED ORDER — DEXAMETHASONE SODIUM PHOSPHATE 20 MG/5ML IJ SOLN
INTRAMUSCULAR | Status: AC
  Filled 2014-08-28: qty 5

## 2014-08-28 MED ORDER — DEXAMETHASONE SODIUM PHOSPHATE 20 MG/5ML IJ SOLN
20.0000 mg | Freq: Once | INTRAMUSCULAR | Status: AC
  Administered 2014-08-28: 20 mg via INTRAVENOUS

## 2014-08-28 MED ORDER — SODIUM CHLORIDE 0.9 % IJ SOLN
10.0000 mL | INTRAMUSCULAR | Status: DC | PRN
  Administered 2014-08-28: 10 mL
  Filled 2014-08-28: qty 10

## 2014-08-28 MED ORDER — SODIUM CHLORIDE 0.9 % IV SOLN
143.4000 mg | Freq: Once | INTRAVENOUS | Status: AC
  Administered 2014-08-28: 140 mg via INTRAVENOUS
  Filled 2014-08-28: qty 14

## 2014-08-28 MED ORDER — DEXAMETHASONE 4 MG PO TABS: 8.0000 mg | ORAL_TABLET | Freq: Two times a day (BID) | ORAL | Status: DC

## 2014-08-28 MED ORDER — PROCHLORPERAZINE MALEATE 10 MG PO TABS: 10.0000 mg | ORAL_TABLET | Freq: Four times a day (QID) | ORAL | Status: DC | PRN

## 2014-08-28 MED ORDER — ONDANSETRON 16 MG/50ML IVPB (CHCC)
16.0000 mg | Freq: Once | INTRAVENOUS | Status: AC
  Administered 2014-08-28: 16 mg via INTRAVENOUS

## 2014-08-28 MED ORDER — ONDANSETRON HCL 8 MG PO TABS: 8.0000 mg | ORAL_TABLET | Freq: Two times a day (BID) | ORAL | Status: DC

## 2014-08-28 MED ORDER — ONDANSETRON 16 MG/50ML IVPB (CHCC)
INTRAVENOUS | Status: AC
  Filled 2014-08-28: qty 16

## 2014-08-28 MED ORDER — SODIUM CHLORIDE 0.9 % IV SOLN
600.0000 mg/m2 | Freq: Once | INTRAVENOUS | Status: AC
  Administered 2014-08-28: 940 mg via INTRAVENOUS
  Filled 2014-08-28: qty 47

## 2014-08-28 MED ORDER — SODIUM CHLORIDE 0.9 % IV SOLN
Freq: Once | INTRAVENOUS | Status: AC
  Administered 2014-08-28: 13:00:00 via INTRAVENOUS

## 2014-08-28 NOTE — Patient Instructions (Signed)
[facility] Discharge Instructions for Patients Receiving Chemotherapy  Today you received the following chemotherapy agents Cytoxan and Carboplatin  To help prevent nausea and vomiting after your treatment, we encourage you to take your nausea medication as directed   If you develop nausea and vomiting that is not controlled by your nausea medication, call the clinic.   BELOW ARE SYMPTOMS THAT SHOULD BE REPORTED IMMEDIATELY:  *FEVER GREATER THAN 100.5 F  *CHILLS WITH OR WITHOUT FEVER  NAUSEA AND VOMITING THAT IS NOT CONTROLLED WITH YOUR NAUSEA MEDICATION  *UNUSUAL SHORTNESS OF BREATH  *UNUSUAL BRUISING OR BLEEDING  TENDERNESS IN MOUTH AND THROAT WITH OR WITHOUT PRESENCE OF ULCERS  *URINARY PROBLEMS  *BOWEL PROBLEMS  UNUSUAL RASH Items with * indicate a potential emergency and should be followed up as soon as possible.  Feel free to call the clinic you have any questions or concerns. The clinic phone number is [phone].

## 2014-08-28 NOTE — Progress Notes (Signed)
ID: Cassandra Nichols   DOB: Jun 06, 1941  MR#: [id number]  HAL#:[id number]   PCP:  Cassandra Smolder, MD GYN:  Cassandra Laroche, MD OTHER:  Cassandra Peach, MD  CHIEF COMPLAINT:  Recurrent Ovarian Cancer  CURRENT TREATMENT: To start cyclophosphamide and carboplatin  NOTE: THIS PATIENT REQUESTS THAT ABSOLUTELY NO NUMERIC INFORMATION BE GIVEN REGARDING ANY LAB AND PARTICULARLY DOES NOT WANT TO KNOW SPECIFIC CA-125 RESULTS  HISTORY OF PRESENT ILLNESS: Cassandra Nichols was initially diagnosed with ovarian cancer in July of 2002. She underwent debulking surgery at that time for what proved to be a 4 of 18 lymph nodes positive disease. She was initially treated with 6 cycles of paclitaxel and carboplatin adjuvantly, completed in December of 2002.  Patient has had multiple recurrences since December of 2002, and has been treated with multiple agents as noted below.  INTERVAL HISTORY: Cassandra Nichols returns today for follow up of her ovarian cancer. Today is day 1, cycle 1 of carboplatin and cyclophosphamide. Since her last visit here, she has tidied up the affair of her recently deceased aunt and has traveled around the country. She is fresh from a beach weekend this past Labor Day holiday.     REVIEW OF SYSTEMS: Cassandra Nichols denies fevers, chills, nausea, vomiting, or changes in bowel or bladder habits. She denies shortness of breath, chest pain, cough, or palpitations. 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 She is feeling just slightly fatigued. She plans to have cataract surgery to her left eye at some point in the future. A detailed review of systems was otherwise noncontributory.  PAST MEDICAL HISTORY: Past Medical History  Diagnosis Date  . Shingles   . Ovarian cancer 11/17/2011  . Cancer     PAST SURGICAL HISTORY: Past Surgical History  Procedure Laterality Date  . Ganglion cyst excision Left     Wrist    FAMILY HISTORY Family  History  Problem Relation Age of Onset  . Hypertension Mother   . GER disease Mother   . Cancer Father     Cancer (stomach)    GYNECOLOGIC HISTORY: GXP2  SOCIAL HISTORY: (Updated 03/08/2014) Cassandra Nichols lives alone. She works temporary jobs, sometimes as a Oceanographer, and sometimes grading tests.  As of March 2015, she is working full-time hours at the [employer] center. She's primary caregiver for her and who is currently 25 and lives in [place].  Her daughter Cassandra Nichols lives in [place] and is Mudlogger of the epidemiology and [place] there; she is married with 2 children. Son  Cassandra Nichols lives in [place], [place]. He has 4 children of his own.His wife is a Marine scientist  ADVANCED DIRECTIVES: in place, but the patient has not been able to find the documents. At the 08/15/2014 visit she was given the appropriate living will and health Department to you documents to complete and notarize at her discretion.  HEALTH MAINTENANCE:  (Updated  03/08/2014) History  Substance Use Topics  . Smoking status: Former Research scientist (life sciences)  . Smokeless tobacco: Never Used  . Alcohol Use: No     Comment: Occasionally     Colonoscopy: Not on file  PAP:   Bone density: Not on file  Lipid panel: Not on file  Mammogram:  Feb 2103, [facility]   Allergies  Allergen Reactions  . Penicillins Rash    Current Outpatient Prescriptions  Medication Sig Dispense Refill  . CYANOCOBALAMIN PO Take 1 tablet by mouth every morning.      Marland Kitchen  loratadine (CLARITIN) 10 MG tablet Take 10 mg by mouth daily as needed for allergies.      . Melatonin 3 MG TABS Take 3 mg by mouth at bedtime as needed.      Marland Kitchen acyclovir (ZOVIRAX) 400 MG tablet       . Alum & Mag Hydroxide-Simeth (MAGIC MOUTHWASH W/LIDOCAINE) SOLN Take 5 mLs by mouth 4 (four) times daily as needed.  240 mL  2  . dexamethasone (DECADRON) 4 MG tablet Take 2 tablets (8 mg total) by mouth 2 (two) times daily with a meal. Start the day after chemotherapy for 3 days.  30 tablet  1  .  fluticasone (FLONASE) 50 MCG/ACT nasal spray Place 1 spray into both nostrils daily.  16 g  2  . ibuprofen (ADVIL,MOTRIN) 800 MG tablet Take 800 mg by mouth every 8 (eight) hours as needed.        . ondansetron (ZOFRAN) 8 MG tablet Take 1 tablet (8 mg total) by mouth 2 (two) times daily. Start the day after chemo for 3 days. Then take as needed for nausea or vomiting.  30 tablet  1  . prochlorperazine (COMPAZINE) 10 MG tablet Take 1 tablet (10 mg total) by mouth every 6 (six) hours as needed (Nausea or vomiting).  30 tablet  1   No current facility-administered medications for this visit.   Facility-Administered Medications Ordered in Other Visits  Medication Dose Route Frequency Provider Last Rate Last Dose  . CARBOplatin (PARAPLATIN) 140 mg in sodium chloride 0.9 % 100 mL chemo infusion  140 mg Intravenous Once Cassandra Cruel, MD      . cyclophosphamide (CYTOXAN) 940 mg in sodium chloride 0.9 % 250 mL chemo infusion  600 mg/m2 (Treatment Plan Actual) Intravenous Once Cassandra Cruel, MD 594 mL/hr at 08/28/14 1327 940 mg at 08/28/14 1327  . heparin lock flush 100 unit/mL  500 Units Intracatheter Once PRN Cassandra Cruel, MD      . sodium chloride 0.9 % injection 10 mL  10 mL Intravenous PRN Cassandra Cruel, MD   10 mL at 05/24/14 1601  . sodium chloride 0.9 % injection 10 mL  10 mL Intracatheter PRN Cassandra Cruel, MD        OBJECTIVE: Middle-aged white woman who appears stated age 73 Vitals:   08/28/14 1123  BP: 145/62  Pulse: 75  Temp: 98.1 F (36.7 C)  Resp: 18     Body mass index is 0.00 kg/(m^2).    ECOG FS: 0  Skin: warm, dry  HEENT: sclerae anicteric, conjunctivae pink, oropharynx clear. No thrush or mucositis.  Lymph Nodes: No cervical or supraclavicular lymphadenopathy  Lungs: clear to auscultation bilaterally, no rales, wheezes, or rhonci  Heart: regular rate and rhythm  Abdomen: round, soft, non tender, positive bowel sounds  Musculoskeletal: No focal spinal  tenderness, no peripheral edema  Neuro: non focal, well oriented, positive affect  Breasts: deferred  LAB RESULTS: Results for ASPYNN, CLOVER (MRN [id number]) as of 08/15/2014 18:04  Ref. Range 01/18/2014 15:48 02/08/2014 11:17 03/08/2014 14:37 03/29/2014 15:33 05/24/2014 16:00  CA 125 Latest Range: 0.0-30.2 U/mL 1212.0 (H) 981.7 (H) 575.8 (H) 513.2 (H) 816.3 (H)    Lab Results  Component Value Date   WBC 5.1 08/28/2014   NEUTROABS 3.3 08/28/2014   HGB 12.8 08/28/2014   HCT 39.4 08/28/2014   MCV 92.8 08/28/2014   PLT 170 08/28/2014      Chemistry      Component Value  Date/Time   NA 140 08/28/2014 1101   NA 140 07/04/2012 0832   K 4.0 08/28/2014 1101   K 4.2 07/04/2012 0832   CL 106 05/09/2013 1304   CL 106 07/04/2012 0832   CO2 24 08/28/2014 1101   CO2 26 07/04/2012 0832   BUN 19.0 08/28/2014 1101   BUN 17 07/04/2012 0832   CREATININE 0.8 08/28/2014 1101   CREATININE 0.82 07/04/2012 0832      Component Value Date/Time   CALCIUM 9.5 08/28/2014 1101   CALCIUM 9.2 07/04/2012 0832   ALKPHOS 83 08/28/2014 1101   ALKPHOS 71 07/04/2012 0832   AST 18 08/28/2014 1101   AST 18 07/04/2012 0832   ALT 13 08/28/2014 1101   ALT 12 07/04/2012 0832   BILITOT 0.47 08/28/2014 1101   BILITOT 0.7 07/04/2012 0832      STUDIES: No results found.  ASSESSMENT: 73 y.o.   woman (1)  with a history of ovarian cancer dating back to July 2002 when she underwent debulking surgery for what proved to be 4/18 lymph node positive disease.   (2)  She received 6 cycles of paclitaxel/carboplatin adjuvantly completed in December 2002.   (3)  She had her first recurrence in December 2005 treated with paclitaxel;   (4)  second recurrence May 2007 treated with [place] and paclitaxel;   (5)  third recurrence January 2010 treated with topotecan and Gemzar;   (6)  fourth recurrence January 2012 treated with single-agent carboplatin (January to August 2012) with initial response, then plateau; with progression January 2013 (less than 6 months  after last carbo dose) treated with single-agent gemzar given Q2 weeks (January to April 2013), with evidence of progression  (7) Doxil strartedJuly 2013 6, with evidence of initial response, and fair tolerance. Held between May and August 2014 due to patient's work schedule and scheduling issues.  Resumed treatment as of 08/24/2013, with some delays secondary to the patient's schedule. As of early December 2014, her total cumulative dose of Doxil reached 560 mg per meter square. In addition there was also an upward trend of the CA 125. Doxil was discontinued (last dose 10/30/2013). Echocardiogram on 11/14/2013 showed a well preserved ejection fraction of 55-60%.  (8)  treated with single agent paclitaxel, given on days one and 8 of each 21 day cycle, first dose on 12/27/2013,with continuing response, but mild to moderate toxicity; 11th and last dose 04/12/2014   (9) carboplatin and cyclophosphamide started on 08/28/14 given every 21 days  PLAN:  Cassandra Nichols is doing well today and is ready to start chemo. The labs were reviewed and were stable. We discussed how to take her anti-emetics at home, and the prescriptions were sent to her pharmacy during this visit. She declined the ativan at this time. We will proceed with day 1, cycle 1 of carboplatin and cyclophosphamide today.   Cassandra Nichols will return to clinic next week for her nadir visit. She understands and agrees with this plan. She knows a treatment goal in her case is control. She has been encouraged to call with any issues that might arise before here next visit here.  Cassandra Duster, NP     08/28/2014

## 2014-08-29 ENCOUNTER — Telehealth: Payer: Self-pay | Admitting: *Deleted

## 2014-08-29 ENCOUNTER — Encounter: Payer: Self-pay | Admitting: *Deleted

## 2014-08-29 ENCOUNTER — Encounter: Payer: Self-pay | Admitting: Nurse Practitioner

## 2014-08-29 LAB — CA 125: CA 125: 2270 U/mL — AB (ref <35)

## 2014-08-29 LAB — CA 125(PREVIOUS METHOD): CA 125: 1629.8 U/mL — ABNORMAL HIGH (ref 0.0–30.2)

## 2014-08-29 NOTE — Progress Notes (Signed)
RECEIVED A FAX FROM [facility] CONCERNING A PRIOR AUTHORIZATION FOR ONDANSETRON.

## 2014-08-29 NOTE — Telephone Encounter (Signed)
REMINDED PT. TO FORCE FLUIDS TO 64 OUNCES IN A 24 HOUR PERIOD. ALSO PT. IS TO TAKE HER MEDICATIONS FOR NAUSEA AS DIRECTED. INFORMED PT. THAT THERE IS A NURSE ON CALL 24/7 WHO CAN REACH A PHYSICIAN IF THE NEED ARISES.

## 2014-08-29 NOTE — Progress Notes (Signed)
[place], [id number], approved ondansetron 8mg  from 08/29/14-08/30/15

## 2014-08-30 ENCOUNTER — Encounter: Payer: Self-pay | Admitting: General Practice

## 2014-08-30 NOTE — Progress Notes (Signed)
Met Cassandra Nichols at the healing arts table, providing pastoral presence and spiritual companionship as we made meditative art and reflected on mysticism and meaning/purpose in life.  She is interested in my book study on "Rising Up" by Almond Lint, suggested other titles for future groups, and plans to set up a time for [place] to discuss books and spiritual resources further.  [place], [place]

## 2014-09-03 ENCOUNTER — Other Ambulatory Visit: Payer: Self-pay | Admitting: Emergency Medicine

## 2014-09-03 DIAGNOSIS — C569 Malignant neoplasm of unspecified ovary: Secondary | ICD-10-CM

## 2014-09-04 ENCOUNTER — Other Ambulatory Visit: Payer: Medicare Other

## 2014-09-04 ENCOUNTER — Telehealth: Payer: Self-pay | Admitting: *Deleted

## 2014-09-04 ENCOUNTER — Other Ambulatory Visit (HOSPITAL_BASED_OUTPATIENT_CLINIC_OR_DEPARTMENT_OTHER): Payer: Medicare Other

## 2014-09-04 ENCOUNTER — Ambulatory Visit (HOSPITAL_BASED_OUTPATIENT_CLINIC_OR_DEPARTMENT_OTHER): Payer: Medicare Other | Admitting: Nurse Practitioner

## 2014-09-04 ENCOUNTER — Telehealth: Payer: Self-pay | Admitting: Nurse Practitioner

## 2014-09-04 ENCOUNTER — Encounter: Payer: Self-pay | Admitting: Nurse Practitioner

## 2014-09-04 VITALS — BP 154/77 | HR 77 | Temp 98.1°F | Resp 18 | Ht 59.0 in

## 2014-09-04 DIAGNOSIS — C569 Malignant neoplasm of unspecified ovary: Secondary | ICD-10-CM

## 2014-09-04 LAB — COMPREHENSIVE METABOLIC PANEL (CC13)
ALBUMIN: 3.5 g/dL (ref 3.5–5.0)
ALT: 16 U/L (ref 0–55)
AST: 18 U/L (ref 5–34)
Alkaline Phosphatase: 88 U/L (ref 40–150)
Anion Gap: 8 mEq/L (ref 3–11)
BILIRUBIN TOTAL: 0.48 mg/dL (ref 0.20–1.20)
BUN: 19.3 mg/dL (ref 7.0–26.0)
CO2: 26 meq/L (ref 22–29)
Calcium: 9.4 mg/dL (ref 8.4–10.4)
Chloride: 106 mEq/L (ref 98–109)
Creatinine: 0.8 mg/dL (ref 0.6–1.1)
GLUCOSE: 85 mg/dL (ref 70–140)
Potassium: 4.5 mEq/L (ref 3.5–5.1)
SODIUM: 141 meq/L (ref 136–145)
TOTAL PROTEIN: 6.7 g/dL (ref 6.4–8.3)

## 2014-09-04 LAB — CBC WITH DIFFERENTIAL/PLATELET
BASO%: 0.4 % (ref 0.0–2.0)
BASOS ABS: 0 10*3/uL (ref 0.0–0.1)
EOS%: 0.6 % (ref 0.0–7.0)
Eosinophils Absolute: 0 10*3/uL (ref 0.0–0.5)
HCT: 36.5 % (ref 34.8–46.6)
HEMOGLOBIN: 12.1 g/dL (ref 11.6–15.9)
LYMPH%: 27.8 % (ref 14.0–49.7)
MCH: 30.4 pg (ref 25.1–34.0)
MCHC: 33.2 g/dL (ref 31.5–36.0)
MCV: 91.7 fL (ref 79.5–101.0)
MONO#: 0.2 10*3/uL (ref 0.1–0.9)
MONO%: 4.8 % (ref 0.0–14.0)
NEUT#: 3.4 10*3/uL (ref 1.5–6.5)
NEUT%: 66.4 % (ref 38.4–76.8)
Platelets: 141 10*3/uL — ABNORMAL LOW (ref 145–400)
RBC: 3.98 10*6/uL (ref 3.70–5.45)
RDW: 14.6 % — ABNORMAL HIGH (ref 11.2–14.5)
WBC: 5 10*3/uL (ref 3.9–10.3)
lymph#: 1.4 10*3/uL (ref 0.9–3.3)

## 2014-09-04 NOTE — Telephone Encounter (Signed)
Per staff message and POF I have scheduled appts. Advised scheduler of appts. JMW  

## 2014-09-04 NOTE — Progress Notes (Signed)
ID: [name]   DOB: 11/06/1941  MR#: [id number]  LOV#:[id number]   PCP:  Marjorie Smolder, MD GYN:  [name], MD OTHER:  [name], MD  CHIEF COMPLAINT:  Recurrent Ovarian Cancer  CURRENT TREATMENT: To start cyclophosphamide and carboplatin  NOTE: THIS PATIENT REQUESTS THAT ABSOLUTELY NO NUMERIC INFORMATION BE GIVEN REGARDING ANY LAB AND PARTICULARLY DOES NOT WANT TO KNOW SPECIFIC CA-125 RESULTS  HISTORY OF PRESENT ILLNESS: Cassandra Nichols was initially diagnosed with ovarian cancer in July of 2002. She underwent debulking surgery at that time for what proved to be a 4 of 18 lymph nodes positive disease. She was initially treated with 6 cycles of paclitaxel and carboplatin adjuvantly, completed in December of 2002.  Patient has had multiple recurrences since December of 2002, and has been treated with multiple agents as noted below.  INTERVAL HISTORY: Cassandra Nichols returns today for follow up of her ovarian cancer. Today is day 8, cycle 1 of carboplatin and cyclophosphamide. She states she tolerated this well with few complaints. She was mildly nauseous on day 2, but her PRN antiemetics were helpful.   REVIEW OF SYSTEMS: Cassandra Nichols denies fevers, chills, nausea, vomiting, or changes in bowel or bladder habits. She denies shortness of breath, chest pain, cough, or palpitations. 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 She is feeling just slightly fatigued and "lazy". She plans to have cataract surgery to her left eye in the upcoming months. A detailed review of systems was otherwise noncontributory.  PAST MEDICAL HISTORY: Past Medical History  Diagnosis Date  . Shingles   . Ovarian cancer 11/17/2011  . Cancer     PAST SURGICAL HISTORY: Past Surgical History  Procedure Laterality Date  . Ganglion cyst excision Left     Wrist    FAMILY HISTORY Family History  Problem Relation Age of Onset  . Hypertension  Mother   . GER disease Mother   . Cancer Father     Cancer (stomach)    GYNECOLOGIC HISTORY: GXP2  SOCIAL HISTORY: (Updated 03/08/2014) Cassandra Nichols lives alone. She works temporary jobs, sometimes as a Oceanographer, and sometimes grading tests.  As of March 2015, she is working full-time hours at the [employer] center. She's primary caregiver for her and who is currently 48 and lives in [place].  Her daughter Cassandra Nichols lives in [place] and is Mudlogger of the epidemiology and [place] there; she is married with 2 children. Son  Cassandra Nichols lives in [place], [place]. He has 4 children of his own.His wife is a Marine scientist  ADVANCED DIRECTIVES: in place, but the patient has not been able to find the documents. At the 08/15/2014 visit she was given the appropriate living will and health Department to you documents to complete and notarize at her discretion.  HEALTH MAINTENANCE:  (Updated  03/08/2014) History  Substance Use Topics  . Smoking status: Former Research scientist (life sciences)  . Smokeless tobacco: Never Used  . Alcohol Use: No     Comment: Occasionally     Colonoscopy: Not on file  PAP:   Bone density: Not on file  Lipid panel: Not on file  Mammogram:  Feb 2103, [facility]   Allergies  Allergen Reactions  . Penicillins Rash    Current Outpatient Prescriptions  Medication Sig Dispense Refill  . dexamethasone (DECADRON) 4 MG tablet Take 2 tablets (8 mg total) by mouth 2 (two) times daily with a meal. Start the day after chemotherapy  for 3 days.  30 tablet  1  . loratadine (CLARITIN) 10 MG tablet Take 10 mg by mouth daily as needed for allergies.      . Melatonin 3 MG TABS Take 3 mg by mouth at bedtime as needed.      . ondansetron (ZOFRAN) 8 MG tablet Take 1 tablet (8 mg total) by mouth 2 (two) times daily. Start the day after chemo for 3 days. Then take as needed for nausea or vomiting.  30 tablet  1  . acyclovir (ZOVIRAX) 400 MG tablet       . Alum & Mag Hydroxide-Simeth (MAGIC MOUTHWASH W/LIDOCAINE) SOLN  Take 5 mLs by mouth 4 (four) times daily as needed.  240 mL  2  . CYANOCOBALAMIN PO Take 1 tablet by mouth every morning.      . fluticasone (FLONASE) 50 MCG/ACT nasal spray Place 1 spray into both nostrils daily.  16 g  2  . ibuprofen (ADVIL,MOTRIN) 800 MG tablet Take 800 mg by mouth every 8 (eight) hours as needed.        . prochlorperazine (COMPAZINE) 10 MG tablet Take 1 tablet (10 mg total) by mouth every 6 (six) hours as needed (Nausea or vomiting).  30 tablet  1   No current facility-administered medications for this visit.   Facility-Administered Medications Ordered in Other Visits  Medication Dose Route Frequency Provider Last Rate Last Dose  . sodium chloride 0.9 % injection 10 mL  10 mL Intravenous PRN Chauncey Cruel, MD   10 mL at 05/24/14 1601    OBJECTIVE: Cassandra Nichols who appears stated age 73 Vitals:   09/04/14 1407  BP: 154/77  Pulse: 77  Temp: 98.1 F (36.7 C)  Resp: 18     Body mass index is 0.00 kg/(m^2).    ECOG FS: 0  Sclerae unicteric, pupils equal and reactive Oropharynx clear and moist-- no thrush No cervical or supraclavicular adenopathy Lungs no rales or rhonchi Heart regular rate and rhythm Abd soft, nontender, positive bowel sounds MSK no focal spinal tenderness, no upper extremity lymphedema Neuro: nonfocal, well oriented, appropriate affect Breasts: deferred  LAB RESULTS: Results for KRISTOPHER, DELK (MRN [id number]) as of 08/15/2014 18:04  Ref. Range 01/18/2014 15:48 02/08/2014 11:17 03/08/2014 14:37 03/29/2014 15:33 05/24/2014 16:00  CA 125 Latest Range: 0.0-30.2 U/mL 1212.0 (H) 981.7 (H) 575.8 (H) 513.2 (H) 816.3 (H)    Lab Results  Component Value Date   WBC 5.0 09/04/2014   NEUTROABS 3.4 09/04/2014   HGB 12.1 09/04/2014   HCT 36.5 09/04/2014   MCV 91.7 09/04/2014   PLT 141* 09/04/2014      Chemistry      Component Value Date/Time   NA 141 09/04/2014 1354   NA 140 07/04/2012 0832   K 4.5 09/04/2014 1354   K 4.2 07/04/2012 0832   CL  106 05/09/2013 1304   CL 106 07/04/2012 0832   CO2 26 09/04/2014 1354   CO2 26 07/04/2012 0832   BUN 19.3 09/04/2014 1354   BUN 17 07/04/2012 0832   CREATININE 0.8 09/04/2014 1354   CREATININE 0.82 07/04/2012 0832      Component Value Date/Time   CALCIUM 9.4 09/04/2014 1354   CALCIUM 9.2 07/04/2012 0832   ALKPHOS 88 09/04/2014 1354   ALKPHOS 71 07/04/2012 0832   AST 18 09/04/2014 1354   AST 18 07/04/2012 0832   ALT 16 09/04/2014 1354   ALT 12 07/04/2012 0832   BILITOT 0.48 09/04/2014 1354  BILITOT 0.7 07/04/2012 0832      STUDIES: No results found.  ASSESSMENT: 73 y.o.  Cassandra Nichols (1)  with a history of ovarian cancer dating back to July 2002 when she underwent debulking surgery for what proved to be 4/18 lymph node positive disease.   (2)  She received 6 cycles of paclitaxel/carboplatin adjuvantly completed in December 2002.   (3)  She had her first recurrence in December 2005 treated with paclitaxel;   (4)  second recurrence May 2007 treated with [place] and paclitaxel;   (5)  third recurrence January 2010 treated with topotecan and Gemzar;   (6)  fourth recurrence January 2012 treated with single-agent carboplatin (January to August 2012) with initial response, then plateau; with progression January 2013 (less than 6 months after last carbo dose) treated with single-agent gemzar given Q2 weeks (January to April 2013), with evidence of progression  (7) Doxil strartedJuly 2013 6, with evidence of initial response, and fair tolerance. Held between May and August 2014 due to patient's work schedule and scheduling issues.  Resumed treatment as of 08/24/2013, with some delays secondary to the patient's schedule. As of early December 2014, her total cumulative dose of Doxil reached 560 mg per meter square. In addition there was also an upward trend of the CA 125. Doxil was discontinued (last dose 10/30/2013). Echocardiogram on 11/14/2013 showed a well preserved ejection fraction of  55-60%.  (8)  treated with single agent paclitaxel, given on days one and 8 of each 21 day cycle, first dose on 12/27/2013,with continuing response, but mild to moderate toxicity; 11th and last dose 04/12/2014   (9) carboplatin and cyclophosphamide started on 08/28/14 given every 21 days  PLAN:  Cassandra Nichols is tolerating her first dose of the carboplatin and cyclophosphamide well. The labs were reviewed in detail and were entirely stable. Cassandra Nichols will continue practicing acupuncture, yoga, and "positive thinking therapy" as she believes this is making a positive impact in her treatment.  Cassandra Nichols will return to clinic in 2 weeks for the start of cycle 2. She understands and agrees with this plan. She knows a treatment goal in her case is control. She has been encouraged to call with any issues that might arise before here next visit here.  Marcelino Duster, NP     09/04/2014

## 2014-09-04 NOTE — Telephone Encounter (Signed)
, °

## 2014-09-17 ENCOUNTER — Ambulatory Visit: Payer: Medicare Other | Admitting: Nurse Practitioner

## 2014-09-17 ENCOUNTER — Ambulatory Visit: Payer: Medicare Other

## 2014-09-17 ENCOUNTER — Other Ambulatory Visit: Payer: Medicare Other

## 2014-09-20 ENCOUNTER — Other Ambulatory Visit: Payer: Self-pay | Admitting: Hematology and Oncology

## 2014-09-20 ENCOUNTER — Encounter: Payer: Self-pay | Admitting: Nurse Practitioner

## 2014-09-20 ENCOUNTER — Telehealth: Payer: Self-pay | Admitting: Oncology

## 2014-09-20 ENCOUNTER — Ambulatory Visit (HOSPITAL_BASED_OUTPATIENT_CLINIC_OR_DEPARTMENT_OTHER): Payer: Medicare Other

## 2014-09-20 ENCOUNTER — Ambulatory Visit (HOSPITAL_BASED_OUTPATIENT_CLINIC_OR_DEPARTMENT_OTHER): Payer: Medicare Other | Admitting: Nurse Practitioner

## 2014-09-20 ENCOUNTER — Other Ambulatory Visit (HOSPITAL_BASED_OUTPATIENT_CLINIC_OR_DEPARTMENT_OTHER): Payer: Medicare Other

## 2014-09-20 VITALS — BP 140/79 | HR 76 | Temp 98.1°F | Resp 18 | Ht 59.0 in

## 2014-09-20 DIAGNOSIS — C569 Malignant neoplasm of unspecified ovary: Secondary | ICD-10-CM

## 2014-09-20 DIAGNOSIS — Z5111 Encounter for antineoplastic chemotherapy: Secondary | ICD-10-CM

## 2014-09-20 LAB — COMPREHENSIVE METABOLIC PANEL (CC13)
ALT: 13 U/L (ref 0–55)
ANION GAP: 9 meq/L (ref 3–11)
AST: 15 U/L (ref 5–34)
Albumin: 3.4 g/dL — ABNORMAL LOW (ref 3.5–5.0)
Alkaline Phosphatase: 86 U/L (ref 40–150)
BUN: 18.1 mg/dL (ref 7.0–26.0)
CALCIUM: 9.2 mg/dL (ref 8.4–10.4)
CHLORIDE: 109 meq/L (ref 98–109)
CO2: 23 mEq/L (ref 22–29)
Creatinine: 0.8 mg/dL (ref 0.6–1.1)
Glucose: 93 mg/dl (ref 70–140)
Potassium: 3.8 mEq/L (ref 3.5–5.1)
SODIUM: 142 meq/L (ref 136–145)
TOTAL PROTEIN: 6.4 g/dL (ref 6.4–8.3)
Total Bilirubin: 0.46 mg/dL (ref 0.20–1.20)

## 2014-09-20 LAB — CBC WITH DIFFERENTIAL/PLATELET
BASO%: 0.5 % (ref 0.0–2.0)
Basophils Absolute: 0 10*3/uL (ref 0.0–0.1)
EOS%: 0.3 % (ref 0.0–7.0)
Eosinophils Absolute: 0 10*3/uL (ref 0.0–0.5)
HEMATOCRIT: 35.3 % (ref 34.8–46.6)
HGB: 11.7 g/dL (ref 11.6–15.9)
LYMPH%: 27.5 % (ref 14.0–49.7)
MCH: 30.3 pg (ref 25.1–34.0)
MCHC: 33.1 g/dL (ref 31.5–36.0)
MCV: 91.5 fL (ref 79.5–101.0)
MONO#: 0.4 10*3/uL (ref 0.1–0.9)
MONO%: 8.8 % (ref 0.0–14.0)
NEUT#: 2.5 10*3/uL (ref 1.5–6.5)
NEUT%: 62.9 % (ref 38.4–76.8)
Platelets: 100 10*3/uL — ABNORMAL LOW (ref 145–400)
RBC: 3.86 10*6/uL (ref 3.70–5.45)
RDW: 15 % — ABNORMAL HIGH (ref 11.2–14.5)
WBC: 4 10*3/uL (ref 3.9–10.3)
lymph#: 1.1 10*3/uL (ref 0.9–3.3)

## 2014-09-20 MED ORDER — SODIUM CHLORIDE 0.9 % IJ SOLN
10.0000 mL | INTRAMUSCULAR | Status: DC | PRN
  Administered 2014-09-20: 10 mL
  Filled 2014-09-20: qty 10

## 2014-09-20 MED ORDER — SODIUM CHLORIDE 0.9 % IV SOLN
Freq: Once | INTRAVENOUS | Status: AC
  Administered 2014-09-20: 15:00:00 via INTRAVENOUS

## 2014-09-20 MED ORDER — ONDANSETRON 16 MG/50ML IVPB (CHCC)
INTRAVENOUS | Status: AC
  Filled 2014-09-20: qty 16

## 2014-09-20 MED ORDER — ONDANSETRON 16 MG/50ML IVPB (CHCC)
16.0000 mg | Freq: Once | INTRAVENOUS | Status: AC
  Administered 2014-09-20: 16 mg via INTRAVENOUS

## 2014-09-20 MED ORDER — CARBOPLATIN CHEMO INJECTION 450 MG/45ML
140.0000 mg | Freq: Once | INTRAVENOUS | Status: AC
  Administered 2014-09-20: 140 mg via INTRAVENOUS
  Filled 2014-09-20: qty 14

## 2014-09-20 MED ORDER — HEPARIN SOD (PORK) LOCK FLUSH 100 UNIT/ML IV SOLN
500.0000 [IU] | Freq: Once | INTRAVENOUS | Status: AC | PRN
  Administered 2014-09-20: 500 [IU]
  Filled 2014-09-20: qty 5

## 2014-09-20 MED ORDER — DEXAMETHASONE SODIUM PHOSPHATE 20 MG/5ML IJ SOLN
20.0000 mg | Freq: Once | INTRAMUSCULAR | Status: AC
  Administered 2014-09-20: 20 mg via INTRAVENOUS

## 2014-09-20 MED ORDER — DEXAMETHASONE SODIUM PHOSPHATE 20 MG/5ML IJ SOLN
INTRAMUSCULAR | Status: AC
  Filled 2014-09-20: qty 5

## 2014-09-20 MED ORDER — SODIUM CHLORIDE 0.9 % IV SOLN
600.0000 mg/m2 | Freq: Once | INTRAVENOUS | Status: AC
  Administered 2014-09-20: 940 mg via INTRAVENOUS
  Filled 2014-09-20: qty 47

## 2014-09-20 NOTE — Telephone Encounter (Signed)
gv pt appt schedule for oct thru dec.

## 2014-09-20 NOTE — Progress Notes (Signed)
OK to treat per Nira Conn

## 2014-09-20 NOTE — Progress Notes (Signed)
ID: Cassandra Nichols   DOB: [birth year]  MR#: [id number]  MGN#:[id number]   PCP:  Cassandra Smolder, MD GYN:  Cassandra Laroche, MD OTHER:  Cassandra Peach, MD  CHIEF COMPLAINT:  Recurrent Ovarian Cancer  CURRENT TREATMENT: To start cyclophosphamide and carboplatin  NOTE: THIS PATIENT REQUESTS THAT ABSOLUTELY NO NUMERIC INFORMATION BE GIVEN REGARDING ANY LAB AND PARTICULARLY DOES NOT WANT TO KNOW SPECIFIC CA-125 RESULTS  HISTORY OF PRESENT ILLNESS: Cassandra Nichols was initially diagnosed with ovarian cancer in July of 2002. She underwent debulking surgery at that time for what proved to be a 4 of 18 lymph nodes positive disease. She was initially treated with 6 cycles of paclitaxel and carboplatin adjuvantly, completed in December of 2002.  Patient has had multiple recurrences since December of 2002, and has been treated with multiple agents as noted below.  INTERVAL HISTORY: Cassandra Nichols returns today for follow up of her ovarian cancer. Today is day 1, cycle 2 of carboplatin and cyclophosphamide. She continues to tolerate treatment well with few complaints. The interval history is significant for a cataract surgery on Monday and a root fragment extraction on Wednesday of this week. She is on clindamycin as for her prophylactic antibiotic. She denies any complications, she is on ibuprofen PRN for mild pain.    REVIEW OF SYSTEMS: Cassandra Nichols denies fevers, chills, nausea, vomiting, or changes in bowel or bladder habits. She has no shortness of breath, chest pain, cough, or palpitations. She has residual peripheral neuropathy in her bilateral feet and toes from her previous chemo, but this is unchanged. A detailed review of systems is otherwise noncontributory.  PAST MEDICAL HISTORY: Past Medical History  Diagnosis Date  . Shingles   . Ovarian cancer 11/17/2011  . Cancer     PAST SURGICAL HISTORY: Past Surgical History  Procedure Laterality Date  . Ganglion cyst excision Left     Wrist    FAMILY HISTORY Family  History  Problem Relation Age of Onset  . Hypertension Mother   . GER disease Mother   . Cancer Father     Cancer (stomach)    GYNECOLOGIC HISTORY: GXP2  SOCIAL HISTORY: (Updated 03/08/2014) Cassandra Nichols lives alone. She works temporary jobs, sometimes as a Oceanographer, and sometimes grading tests.  As of March 2015, she is working full-time hours at the [employer] center. She's primary caregiver for her and who is currently 65 and lives in [place].  Her daughter Cassandra Nichols lives in [place] and is Mudlogger of the epidemiology and [place] there; she is married with 2 children. Son  Cassandra Nichols lives in [place], [place]. He has 4 children of his own.His wife is a Marine scientist  ADVANCED DIRECTIVES: in place, but the patient has not been able to find the documents. At the 08/15/2014 visit she was given the appropriate living will and health Department to you documents to complete and notarize at her discretion.  HEALTH MAINTENANCE:  (Updated  03/08/2014) History  Substance Use Topics  . Smoking status: Former Research scientist (life sciences)  . Smokeless tobacco: Never Used  . Alcohol Use: No     Comment: Occasionally     Colonoscopy: Not on file  PAP:   Bone density: Not on file  Lipid panel: Not on file  Mammogram:  Feb 2103, [facility]   Allergies  Allergen Reactions  . Penicillins Rash    Current Outpatient Prescriptions  Medication Sig Dispense Refill  . acyclovir (ZOVIRAX) 400 MG tablet       . Alum & Mag Hydroxide-Simeth (  MAGIC MOUTHWASH W/LIDOCAINE) SOLN Take 5 mLs by mouth 4 (four) times daily as needed.  240 mL  2  . CYANOCOBALAMIN PO Take 1 tablet by mouth every morning.      Marland Kitchen dexamethasone (DECADRON) 4 MG tablet Take 2 tablets (8 mg total) by mouth 2 (two) times daily with a meal. Start the day after chemotherapy for 3 days.  30 tablet  1  . fluticasone (FLONASE) 50 MCG/ACT nasal spray Place 1 spray into both nostrils daily.  16 g  2  . ibuprofen (ADVIL,MOTRIN) 800 MG tablet Take 800 mg by mouth every 8  (eight) hours as needed.        . loratadine (CLARITIN) 10 MG tablet Take 10 mg by mouth daily as needed for allergies.      . Melatonin 3 MG TABS Take 3 mg by mouth at bedtime as needed.      . ondansetron (ZOFRAN) 8 MG tablet Take 1 tablet (8 mg total) by mouth 2 (two) times daily. Start the day after chemo for 3 days. Then take as needed for nausea or vomiting.  30 tablet  1  . prochlorperazine (COMPAZINE) 10 MG tablet Take 1 tablet (10 mg total) by mouth every 6 (six) hours as needed (Nausea or vomiting).  30 tablet  1   No current facility-administered medications for this visit.   Facility-Administered Medications Ordered in Other Visits  Medication Dose Route Frequency Provider Last Rate Last Dose  . sodium chloride 0.9 % injection 10 mL  10 mL Intravenous PRN Cassandra Cruel, MD   10 mL at 05/24/14 1601    OBJECTIVE: Middle-aged white woman who appears stated age 73 Vitals:   09/20/14 1321  BP: 140/79  Pulse: 76  Temp: 98.1 F (36.7 C)  Resp: 18     Body mass index is 0.00 kg/(m^2).    ECOG FS: 0  Skin: warm, dry  HEENT: sclerae anicteric, conjunctivae pink, oropharynx clear. No thrush or mucositis.  Lymph Nodes: No cervical or supraclavicular lymphadenopathy  Lungs: clear to auscultation bilaterally, no rales, wheezes, or rhonci  Heart: regular rate and rhythm  Abdomen: round, soft, non tender, positive bowel sounds  Musculoskeletal: No focal spinal tenderness, no peripheral edema  Neuro: non focal, well oriented, positive affect    LAB RESULTS: Results for Cassandra, Nichols (MRN [id number]) as of 08/15/2014 18:04  Ref. Range 01/18/2014 15:48 02/08/2014 11:17 03/08/2014 14:37 03/29/2014 15:33 05/24/2014 16:00  CA 125 Latest Range: 0.0-30.2 U/mL 1212.0 (H) 981.7 (H) 575.8 (H) 513.2 (H) 816.3 (H)    Lab Results  Component Value Date   WBC 4.0 09/20/2014   NEUTROABS 2.5 09/20/2014   HGB 11.7 09/20/2014   HCT 35.3 09/20/2014   MCV 91.5 09/20/2014   PLT 100* 09/20/2014         Chemistry      Component Value Date/Time   NA 142 09/20/2014 1258   NA 140 07/04/2012 0832   K 3.8 09/20/2014 1258   K 4.2 07/04/2012 0832   CL 106 05/09/2013 1304   CL 106 07/04/2012 0832   CO2 23 09/20/2014 1258   CO2 26 07/04/2012 0832   BUN 18.1 09/20/2014 1258   BUN 17 07/04/2012 0832   CREATININE 0.8 09/20/2014 1258   CREATININE 0.82 07/04/2012 0832      Component Value Date/Time   CALCIUM 9.2 09/20/2014 1258   CALCIUM 9.2 07/04/2012 0832   ALKPHOS 86 09/20/2014 1258   ALKPHOS 71 07/04/2012 0832   AST  15 09/20/2014 1258   AST 18 07/04/2012 0832   ALT 13 09/20/2014 1258   ALT 12 07/04/2012 0832   BILITOT 0.46 09/20/2014 1258   BILITOT 0.7 07/04/2012 0832      STUDIES: No results found.  ASSESSMENT: 73 y.o.  [place] woman (1)  with a history of ovarian cancer dating back to July 2002 when she underwent debulking surgery for what proved to be 4/18 lymph node positive disease.   (2)  She received 6 cycles of paclitaxel/carboplatin adjuvantly completed in December 2002.   (3)  She had her first recurrence in December 2005 treated with paclitaxel;   (4)  second recurrence May 2007 treated with [place] and paclitaxel;   (5)  third recurrence January 2010 treated with topotecan and Gemzar;   (6)  fourth recurrence January 2012 treated with single-agent carboplatin (January to August 2012) with initial response, then plateau; with progression January 2013 (less than 6 months after last carbo dose) treated with single-agent gemzar given Q2 weeks (January to April 2013), with evidence of progression  (7) Doxil strartedJuly 2013 6, with evidence of initial response, and fair tolerance. Held between May and August 2014 due to patient's work schedule and scheduling issues.  Resumed treatment as of 08/24/2013, with some delays secondary to the patient's schedule. As of early December 2014, her total cumulative dose of Doxil reached 560 mg per meter square. In addition there was also an upward trend  of the CA 125. Doxil was discontinued (last dose 10/30/2013). Echocardiogram on 11/14/2013 showed a well preserved ejection fraction of 55-60%.  (8)  treated with single agent paclitaxel, given on days one and 8 of each 21 day cycle, first dose on 12/27/2013,with continuing response, but mild to moderate toxicity; 11th and last dose 04/12/2014   (9) carboplatin and cyclophosphamide started on 08/28/14 given every 21 days  PLAN:  Cassandra Nichols continues to tolerate her chemo well. The labs were reviewed in detail, and while her platelet count has decreased through the weeks down to 100 today, she is still ok to treat. We will just watch for this level with the upcoming treatments.   Cassandra Nichols has asked to decrease her office visits to every other treatment, which is every 6 weeks. This is acceptable. Her next treatment day will be 10/22. She will return for a follow up visit on 11/13. The plan is to do 6-8 treatments before she takes another break. Cassandra Nichols understands and agrees with this plan. She knows a goal of treatment in her case is control. She has been encouraged to call with any issues that might arise before her next visit here.   Marcelino Duster, NP     09/20/2014

## 2014-10-10 ENCOUNTER — Other Ambulatory Visit: Payer: Self-pay | Admitting: Oncology

## 2014-10-10 ENCOUNTER — Other Ambulatory Visit (HOSPITAL_BASED_OUTPATIENT_CLINIC_OR_DEPARTMENT_OTHER): Payer: Medicare Other

## 2014-10-10 ENCOUNTER — Ambulatory Visit: Payer: Medicare Other | Admitting: Nurse Practitioner

## 2014-10-10 ENCOUNTER — Ambulatory Visit (HOSPITAL_BASED_OUTPATIENT_CLINIC_OR_DEPARTMENT_OTHER): Payer: Medicare Other

## 2014-10-10 VITALS — BP 133/72 | HR 82 | Temp 98.3°F | Resp 18

## 2014-10-10 DIAGNOSIS — Z5111 Encounter for antineoplastic chemotherapy: Secondary | ICD-10-CM

## 2014-10-10 DIAGNOSIS — C569 Malignant neoplasm of unspecified ovary: Secondary | ICD-10-CM

## 2014-10-10 LAB — CBC WITH DIFFERENTIAL/PLATELET
BASO%: 0.8 % (ref 0.0–2.0)
Basophils Absolute: 0 10*3/uL (ref 0.0–0.1)
EOS%: 0.4 % (ref 0.0–7.0)
Eosinophils Absolute: 0 10*3/uL (ref 0.0–0.5)
HEMATOCRIT: 37 % (ref 34.8–46.6)
HGB: 12 g/dL (ref 11.6–15.9)
LYMPH%: 32.3 % (ref 14.0–49.7)
MCH: 30.6 pg (ref 25.1–34.0)
MCHC: 32.4 g/dL (ref 31.5–36.0)
MCV: 94.3 fL (ref 79.5–101.0)
MONO#: 0.4 10*3/uL (ref 0.1–0.9)
MONO%: 13.4 % (ref 0.0–14.0)
NEUT#: 1.7 10*3/uL (ref 1.5–6.5)
NEUT%: 53.1 % (ref 38.4–76.8)
PLATELETS: 152 10*3/uL (ref 145–400)
RBC: 3.92 10*6/uL (ref 3.70–5.45)
RDW: 16.8 % — ABNORMAL HIGH (ref 11.2–14.5)
WBC: 3.2 10*3/uL — ABNORMAL LOW (ref 3.9–10.3)
lymph#: 1 10*3/uL (ref 0.9–3.3)

## 2014-10-10 LAB — COMPREHENSIVE METABOLIC PANEL (CC13)
ALK PHOS: 85 U/L (ref 40–150)
ALT: 15 U/L (ref 0–55)
AST: 17 U/L (ref 5–34)
Albumin: 3.8 g/dL (ref 3.5–5.0)
Anion Gap: 9 mEq/L (ref 3–11)
BILIRUBIN TOTAL: 0.47 mg/dL (ref 0.20–1.20)
BUN: 16.1 mg/dL (ref 7.0–26.0)
CO2: 25 mEq/L (ref 22–29)
CREATININE: 0.8 mg/dL (ref 0.6–1.1)
Calcium: 9.8 mg/dL (ref 8.4–10.4)
Chloride: 107 mEq/L (ref 98–109)
Glucose: 120 mg/dl (ref 70–140)
Potassium: 4 mEq/L (ref 3.5–5.1)
SODIUM: 141 meq/L (ref 136–145)
TOTAL PROTEIN: 6.8 g/dL (ref 6.4–8.3)

## 2014-10-10 MED ORDER — SODIUM CHLORIDE 0.9 % IV SOLN
Freq: Once | INTRAVENOUS | Status: AC
  Administered 2014-10-10: 15:00:00 via INTRAVENOUS

## 2014-10-10 MED ORDER — SODIUM CHLORIDE 0.9 % IV SOLN
140.0000 mg | Freq: Once | INTRAVENOUS | Status: AC
  Administered 2014-10-10: 140 mg via INTRAVENOUS
  Filled 2014-10-10: qty 14

## 2014-10-10 MED ORDER — CARBOPLATIN CHEMO INTRADERMAL TEST DOSE 100MCG/0.02ML
100.0000 ug | Freq: Once | INTRADERMAL | Status: AC
  Administered 2014-10-10: 100 ug via INTRADERMAL
  Filled 2014-10-10: qty 0.01

## 2014-10-10 MED ORDER — ONDANSETRON 16 MG/50ML IVPB (CHCC)
INTRAVENOUS | Status: AC
  Filled 2014-10-10: qty 16

## 2014-10-10 MED ORDER — ONDANSETRON 16 MG/50ML IVPB (CHCC)
16.0000 mg | Freq: Once | INTRAVENOUS | Status: AC
  Administered 2014-10-10: 16 mg via INTRAVENOUS

## 2014-10-10 MED ORDER — DEXAMETHASONE SODIUM PHOSPHATE 10 MG/ML IJ SOLN
INTRAMUSCULAR | Status: AC
  Filled 2014-10-10: qty 1

## 2014-10-10 MED ORDER — DEXAMETHASONE SODIUM PHOSPHATE 10 MG/ML IJ SOLN
4.0000 mg | Freq: Once | INTRAMUSCULAR | Status: AC
  Administered 2014-10-10: 4 mg via INTRAVENOUS

## 2014-10-10 MED ORDER — CYCLOPHOSPHAMIDE CHEMO INJECTION 1 GM
600.0000 mg/m2 | Freq: Once | INTRAMUSCULAR | Status: AC
  Administered 2014-10-10: 940 mg via INTRAVENOUS
  Filled 2014-10-10: qty 47

## 2014-10-10 MED ORDER — HEPARIN SOD (PORK) LOCK FLUSH 100 UNIT/ML IV SOLN
500.0000 [IU] | Freq: Once | INTRAVENOUS | Status: AC | PRN
  Administered 2014-10-10: 500 [IU]
  Filled 2014-10-10: qty 5

## 2014-10-10 MED ORDER — SODIUM CHLORIDE 0.9 % IJ SOLN
10.0000 mL | INTRAMUSCULAR | Status: DC | PRN
  Administered 2014-10-10: 10 mL
  Filled 2014-10-10: qty 10

## 2014-10-10 NOTE — Progress Notes (Signed)
Pt started c/o itching on her hands at end of Carboplatin infusion.  Then her feet started itching.  Infusion stopped, was almost complete except for a few mls.  No hives, no rash noted and pt did not have any itching elsewhere on her body.   Pt reported itching in hands stopped several minutes after [place] stopped.  Feet were still a little itchy on d/c.   Instructed pt to take benadryl  25 mg at home every 6 hrs prn and call if symptoms worsen.  She verbalized understanding.  Message sent to Dr. Jana Hakim and his nurse, Val.

## 2014-10-10 NOTE — Patient Instructions (Signed)
[facility] Discharge Instructions for Patients Receiving Chemotherapy  Today you received the following chemotherapy agents: Cytoxan, Carboplatin  To help prevent nausea and vomiting after your treatment, we encourage you to take your nausea medication: compazine 10 mg every 6 hours as needed.   If you develop nausea and vomiting that is not controlled by your nausea medication, call the clinic.   BELOW ARE SYMPTOMS THAT SHOULD BE REPORTED IMMEDIATELY:  *FEVER GREATER THAN 100.5 F  *CHILLS WITH OR WITHOUT FEVER  NAUSEA AND VOMITING THAT IS NOT CONTROLLED WITH YOUR NAUSEA MEDICATION  *UNUSUAL SHORTNESS OF BREATH  *UNUSUAL BRUISING OR BLEEDING  TENDERNESS IN MOUTH AND THROAT WITH OR WITHOUT PRESENCE OF ULCERS  *URINARY PROBLEMS  *BOWEL PROBLEMS  UNUSUAL RASH Items with * indicate a potential emergency and should be followed up as soon as possible.  Feel free to call the clinic you have any questions or concerns. The clinic phone number is [phone].

## 2014-10-11 LAB — CA 125: CA 125: 2835 U/mL — ABNORMAL HIGH (ref <35)

## 2014-10-11 LAB — CA 125(PREVIOUS METHOD): CA 125: 1787.4 U/mL — ABNORMAL HIGH (ref 0.0–30.2)

## 2014-10-12 ENCOUNTER — Other Ambulatory Visit: Payer: Self-pay | Admitting: Oncology

## 2014-10-12 NOTE — Progress Notes (Unsigned)
Cassandra Nichols had a minor reaction to [place] last dose, despite a negative skin test. Options are change to cisplatin, change to other treatment, or increase the premeds. Because she is a having a good response to this treatment and is otherwise tolerating it well I am going to try the latter route, and have increased the steroids and added benadryl and pepcid to the premeds. If there is a significant reaction we will have to conside the above options or desensitization (which requires admission)

## 2014-10-24 ENCOUNTER — Other Ambulatory Visit: Payer: Self-pay | Admitting: Oncology

## 2014-10-24 DIAGNOSIS — C569 Malignant neoplasm of unspecified ovary: Secondary | ICD-10-CM

## 2014-10-25 ENCOUNTER — Telehealth: Payer: Self-pay | Admitting: Oncology

## 2014-10-25 ENCOUNTER — Telehealth: Payer: Self-pay | Admitting: Emergency Medicine

## 2014-10-25 NOTE — Telephone Encounter (Signed)
, °

## 2014-10-25 NOTE — Telephone Encounter (Signed)
Patient called to cancel appointments for 11/01/14.   Patient states that she took Imodium on Sunday and Monday and the diarrhea symptoms stopped; then she states the symptoms came back on Wednesday after not taking the Imodium on Tuesday and Wednesday;  so she restarted the Imodium on Thursday and is continuing to take it.   Left patient a message stating that her next scheduled appointments are 12/3 and 12/11.   Advised patient to call with any concerns or questions in the meantime.

## 2014-10-27 ENCOUNTER — Other Ambulatory Visit: Payer: Self-pay | Admitting: Oncology

## 2014-10-27 NOTE — Progress Notes (Unsigned)
Cassandra Nichols CEA 125 has been rising despite treatment and in addition she appears to be developing allergies to carboplatin.I discussed treatment options with Cassandra Nichols and he suggested we consider Avastin and Cytoxan at 50 mg/m given continuously. I am scheduling an appointment with Cassandra Nichols for December 3 2 discuss this further.

## 2014-10-28 ENCOUNTER — Telehealth: Payer: Self-pay | Admitting: Oncology

## 2014-11-01 ENCOUNTER — Ambulatory Visit: Payer: Medicare Other | Admitting: Nurse Practitioner

## 2014-11-01 ENCOUNTER — Other Ambulatory Visit: Payer: Medicare Other

## 2014-11-01 ENCOUNTER — Ambulatory Visit: Payer: Medicare Other

## 2014-11-21 ENCOUNTER — Telehealth: Payer: Self-pay | Admitting: Oncology

## 2014-11-21 ENCOUNTER — Other Ambulatory Visit: Payer: Medicare Other

## 2014-11-21 ENCOUNTER — Ambulatory Visit (HOSPITAL_BASED_OUTPATIENT_CLINIC_OR_DEPARTMENT_OTHER): Payer: Medicare Other | Admitting: Oncology

## 2014-11-21 ENCOUNTER — Other Ambulatory Visit (HOSPITAL_BASED_OUTPATIENT_CLINIC_OR_DEPARTMENT_OTHER): Payer: Medicare Other

## 2014-11-21 ENCOUNTER — Ambulatory Visit: Payer: Medicare Other

## 2014-11-21 VITALS — BP 143/73 | HR 88 | Temp 97.5°F | Resp 18 | Ht 59.0 in | Wt 121.7 lb

## 2014-11-21 DIAGNOSIS — C569 Malignant neoplasm of unspecified ovary: Secondary | ICD-10-CM

## 2014-11-21 LAB — CBC WITH DIFFERENTIAL/PLATELET
BASO%: 0.7 % (ref 0.0–2.0)
Basophils Absolute: 0 10*3/uL (ref 0.0–0.1)
EOS%: 0.6 % (ref 0.0–7.0)
Eosinophils Absolute: 0 10*3/uL (ref 0.0–0.5)
HCT: 37.6 % (ref 34.8–46.6)
HGB: 11.9 g/dL (ref 11.6–15.9)
LYMPH%: 36.8 % (ref 14.0–49.7)
MCH: 30.2 pg (ref 25.1–34.0)
MCHC: 31.7 g/dL (ref 31.5–36.0)
MCV: 95.2 fL (ref 79.5–101.0)
MONO#: 0.4 10*3/uL (ref 0.1–0.9)
MONO%: 7.5 % (ref 0.0–14.0)
NEUT%: 54.4 % (ref 38.4–76.8)
NEUTROS ABS: 2.7 10*3/uL (ref 1.5–6.5)
Platelets: 165 10*3/uL (ref 145–400)
RBC: 3.95 10*6/uL (ref 3.70–5.45)
RDW: 17.8 % — ABNORMAL HIGH (ref 11.2–14.5)
WBC: 4.9 10*3/uL (ref 3.9–10.3)
lymph#: 1.8 10*3/uL (ref 0.9–3.3)

## 2014-11-21 LAB — COMPREHENSIVE METABOLIC PANEL (CC13)
ALK PHOS: 77 U/L (ref 40–150)
ALT: 12 U/L (ref 0–55)
AST: 16 U/L (ref 5–34)
Albumin: 3.7 g/dL (ref 3.5–5.0)
Anion Gap: 11 mEq/L (ref 3–11)
BILIRUBIN TOTAL: 0.44 mg/dL (ref 0.20–1.20)
BUN: 19.8 mg/dL (ref 7.0–26.0)
CO2: 24 mEq/L (ref 22–29)
Calcium: 9.6 mg/dL (ref 8.4–10.4)
Chloride: 105 mEq/L (ref 98–109)
Creatinine: 0.8 mg/dL (ref 0.6–1.1)
EGFR: 71 mL/min/{1.73_m2} — ABNORMAL LOW (ref >90)
Glucose: 88 mg/dl (ref 70–140)
POTASSIUM: 4.2 meq/L (ref 3.5–5.1)
SODIUM: 141 meq/L (ref 136–145)
Total Protein: 6.8 g/dL (ref 6.4–8.3)

## 2014-11-21 NOTE — Telephone Encounter (Signed)
per pof to CX 12/11 appt-sent MW email to sch trmt-will call pt once reply

## 2014-11-21 NOTE — Progress Notes (Signed)
ID: Cassandra Nichols   DOB: [birth year]  MR#: [id number]  WUJ#:[id number]   PCP:  Marjorie Smolder, MD GYN:  [name], MD OTHER:  [name], MD  CHIEF COMPLAINT:  Recurrent Ovarian Cancer  CURRENT TREATMENT: To start cyclophosphamide and carboplatin  NOTE: THIS PATIENT REQUESTS THAT ABSOLUTELY NO NUMERIC INFORMATION BE GIVEN REGARDING ANY LAB AND PARTICULARLY DOES NOT WANT TO KNOW SPECIFIC CA-125 RESULTS  HISTORY OF PRESENT ILLNESS: Cassandra Nichols was initially diagnosed with ovarian cancer in July of 2002. She underwent debulking surgery at that time for what proved to be a 4 of 18 lymph nodes positive disease. She was initially treated with 6 cycles of paclitaxel and carboplatin adjuvantly, completed in December of 2002.  Patient has had multiple recurrences since December of 2002, and has been treated with multiple agents as noted below.  INTERVAL HISTORY: Cassandra Nichols returns today for follow up of her ovarian cancer. When she received her last treatment with carboplatin and cyclophosphamide she had an apparent reaction to the carboplatin. We could conceivably admit the patient for desensitization if we had strong evidence that this combination was working, not her CEA 125's, showed no evidence of response. Accordingly she is here today to consider switching treatments.    REVIEW OF SYSTEMS: Cassandra Nichols had a quiet thanksgivings after visiting her family in [place]. She is delighted that her daughter Cassandra Nichols is gone up fly [employer] [place] between December 23 and December 29. Cassandra Nichols continues to work at [employer], with her schedule finishing December 18 and resuming sometime in January. She denies unusual headaches, visual changes, nausea, vomiting, unusual fatigue, unexplained weight loss, change in bowel or bladder habits, pain, rash, fever, or bleeding problems. A detailed review of systems today was otherwise stable  PAST MEDICAL HISTORY: Past Medical History  Diagnosis Date  . Shingles    . Ovarian cancer 11/17/2011  . Cancer     PAST SURGICAL HISTORY: Past Surgical History  Procedure Laterality Date  . Ganglion cyst excision Left     Wrist    FAMILY HISTORY Family History  Problem Relation Age of Onset  . Hypertension Mother   . GER disease Mother   . Cancer Father     Cancer (stomach)    GYNECOLOGIC HISTORY: GXP2  SOCIAL HISTORY: (Updated 03/08/2014) Cassandra Nichols lives alone. She works temporary jobs, sometimes as a Oceanographer, and sometimes grading tests.  As of March 2015, she is working full-time hours at the [employer] center. She's primary caregiver for her and who is currently 69 and lives in [place].  Her daughter Cassandra Nichols lives in [place] and is Mudlogger of the epidemiology and [place] there; she is married with 2 children. Son  Cassandra Nichols lives in [place], [place]. He has 4 children of his own.His wife is a Marine scientist  ADVANCED DIRECTIVES: in place, but the patient has not been able to find the documents. At the 08/15/2014 visit she was given the appropriate living will and health Department to you documents to complete and notarize at her discretion.  HEALTH MAINTENANCE:  (Updated  03/08/2014) History  Substance Use Topics  . Smoking status: Former Research scientist (life sciences)  . Smokeless tobacco: Never Used  . Alcohol Use: No     Comment: Occasionally     Colonoscopy: Not on file  PAP:   Bone density: Not on file  Lipid panel: Not on file  Mammogram:  Feb 2103, [facility]   Allergies  Allergen Reactions  . Penicillins Rash  Current Outpatient Prescriptions  Medication Sig Dispense Refill  . acyclovir (ZOVIRAX) 400 MG tablet     . Alum & Mag Hydroxide-Simeth (MAGIC MOUTHWASH W/LIDOCAINE) SOLN Take 5 mLs by mouth 4 (four) times daily as needed. 240 mL 2  . Besifloxacin HCl (BESIVANCE OP) Apply to eye.    . Bromfenac Sodium (PROLENSA OP) Apply to eye.    . chlorhexidine (PERIDEX) 0.12 % solution Use as directed 15 mLs in the mouth or throat 2 (two) times daily.     . clindamycin (CLEOCIN) 300 MG capsule Take 300 mg by mouth 4 (four) times daily.    . CYANOCOBALAMIN PO Take 1 tablet by mouth every morning.    Marland Kitchen dexamethasone (DECADRON) 4 MG tablet Take 2 tablets (8 mg total) by mouth 2 (two) times daily with a meal. Start the day after chemotherapy for 3 days. 30 tablet 1  . Difluprednate (DUREZOL OP) Apply to eye.    . fluticasone (FLONASE) 50 MCG/ACT nasal spray Place 1 spray into both nostrils daily. 16 g 2  . ibuprofen (ADVIL,MOTRIN) 800 MG tablet Take 800 mg by mouth every 8 (eight) hours as needed.      . loratadine (CLARITIN) 10 MG tablet Take 10 mg by mouth daily as needed for allergies.    . Melatonin 3 MG TABS Take 3 mg by mouth at bedtime as needed.    . ondansetron (ZOFRAN) 8 MG tablet Take 1 tablet (8 mg total) by mouth 2 (two) times daily. Start the day after chemo for 3 days. Then take as needed for nausea or vomiting. 30 tablet 1  . prochlorperazine (COMPAZINE) 10 MG tablet Take 1 tablet (10 mg total) by mouth every 6 (six) hours as needed (Nausea or vomiting). 30 tablet 1   No current facility-administered medications for this visit.   Facility-Administered Medications Ordered in Other Visits  Medication Dose Route Frequency Provider Last Rate Last Dose  . sodium chloride 0.9 % injection 10 mL  10 mL Intravenous PRN Chauncey Cruel, MD   10 mL at 05/24/14 1601    OBJECTIVE: Middle-aged white woman in no acute distress Filed Vitals:   11/21/14 1601  BP: 143/73  Pulse: 88  Temp: 97.5 F (36.4 C)  Resp: 18     Body mass index is 24.57 kg/(m^2).    ECOG FS: 0  Current weight 122 pounds, usual range 119 to 128  Sclerae unicteric, pupils equal and reactive Oropharynx clear and moist-- no thrush or other lesions, no masses palpated No cervical or supraclavicular adenopathy Lungs no rales or rhonchi Heart regular rate and rhythm Abd soft, nontender, nondistended, positive bowel sounds,  no masses palpated MSK no focal spinal  tenderness, no upper extremity lymphedema Neuro: nonfocal, well oriented, appropriate affect Breasts: Deferred    LAB RESULTS:  Lab Results  Component Value Date   WBC 4.9 11/21/2014   NEUTROABS 2.7 11/21/2014   HGB 11.9 11/21/2014   HCT 37.6 11/21/2014   MCV 95.2 11/21/2014   PLT 165 11/21/2014        Chemistry      Component Value Date/Time   NA 141 10/10/2014 1344   NA 140 07/04/2012 0832   K 4.0 10/10/2014 1344   K 4.2 07/04/2012 0832   CL 106 05/09/2013 1304   CL 106 07/04/2012 0832   CO2 25 10/10/2014 1344   CO2 26 07/04/2012 0832   BUN 16.1 10/10/2014 1344   BUN 17 07/04/2012 0832   CREATININE 0.8 10/10/2014 1344  CREATININE 0.82 07/04/2012 0832      Component Value Date/Time   CALCIUM 9.8 10/10/2014 1344   CALCIUM 9.2 07/04/2012 0832   ALKPHOS 85 10/10/2014 1344   ALKPHOS 71 07/04/2012 0832   AST 17 10/10/2014 1344   AST 18 07/04/2012 0832   ALT 15 10/10/2014 1344   ALT 12 07/04/2012 0832   BILITOT 0.47 10/10/2014 1344   BILITOT 0.7 07/04/2012 0832     Results for NAI, BORROMEO (MRN [id number]) as of 11/23/2014 10:19  Ref. Range 08/28/2014 11:01 08/28/2014 11:01 10/10/2014 13:45 10/10/2014 13:45 11/21/2014 15:48  CA 125 Latest Range: <35 U/mL 2270 (H) 1629.8 (H) 2835 (H) 1787.4 (H) 2330 (H)   STUDIES: No results found.  ASSESSMENT: 73 y.o.  [place] woman (1)  with a history of ovarian cancer dating back to July 2002 when she underwent debulking surgery for what proved to be 4/18 lymph node positive disease.   (2)  She received 6 cycles of paclitaxel/carboplatin adjuvantly completed in December 2002.   (3)  She had her first recurrence in December 2005 treated with paclitaxel;   (4)  second recurrence May 2007 treated with [place] and paclitaxel;   (5)  third recurrence January 2010 treated with topotecan and Gemzar;   (6)  fourth recurrence January 2012 treated with single-agent carboplatin (January to August 2012) with initial response, then  plateau; with progression January 2013 (less than 6 months after last carbo dose) treated with single-agent gemzar given Q2 weeks (January to April 2013), with evidence of progression  (7) Doxil strartedJuly 2013 6, with evidence of initial response, and fair tolerance. Held between May and August 2014 due to patient's work schedule and scheduling issues.  Resumed treatment as of 08/24/2013, with some delays secondary to the patient's schedule. As of early December 2014, her total cumulative dose of Doxil reached 560 mg per meter square. In addition there was also an upward trend of the CA 125. Doxil was discontinued (last dose 10/30/2013). Echocardiogram on 11/14/2013 showed a well preserved ejection fraction of 55-60%.  (8)  treated with single agent paclitaxel, given on days one and 8 of each 21 day cycle, first dose on 12/27/2013,with continuing response, but mild to moderate toxicity; 11th and last dose 04/12/2014   (9) carboplatin and cyclophosphamide started on 08/28/14 given every 21 days-- stopped 10/10/2014 after a reaction to carboplatin (also no evidence of response)  (10) cyclophosphamide and bevacizumab to start 12/02/2014, to be repeated every 21 days  PLAN:  Haylei Cobin is doing well clinically. Because we did not see a response to the carboplatin/Cytoxan and because of her apparent reaction to carboplatin at her most recent dose, we are going to change her treatment. I discussed this with Dr. Aldean Ast, and he suggested oral cyclophosphamide and bevacizumab IV. I discussed this suggestion at length with Cassandra Nichols today.  The oral pills would be very difficult for her. This would be both a financial issue and a struggle for her to take on a daily basis. She would much prefer to receive this cyclophosphamide intravenously.  Accordingly we are going to do Cytoxan IV with bevacizumab IV every 3 weeks beginning on December 14. We are obtaining a new CT of the abdomen to serve as her new  baseline and of course we will follow this CA-125 closely. Today we discussed the possible toxicities, side effects and complications of these agents including the possible bleeding issues with bevacizumab.  Ercilia Bettinger has a good understanding of the overall plan.  She agrees with it. She knows the goal of treatment in her case is control. She will call with any problems that may develop before her next visit here.    Chauncey Cruel, MD     11/21/2014

## 2014-11-22 ENCOUNTER — Ambulatory Visit (HOSPITAL_COMMUNITY)
Admission: RE | Admit: 2014-11-22 | Discharge: 2014-11-22 | Disposition: A | Discharge status: Home / Self Care | Payer: Medicare Other | Source: Ambulatory Visit | Attending: Oncology | Admitting: Oncology

## 2014-11-22 ENCOUNTER — Telehealth: Payer: Self-pay | Admitting: *Deleted

## 2014-11-22 DIAGNOSIS — Z9221 Personal history of antineoplastic chemotherapy: Secondary | ICD-10-CM | POA: Insufficient documentation

## 2014-11-22 DIAGNOSIS — Z87891 Personal history of nicotine dependence: Secondary | ICD-10-CM | POA: Insufficient documentation

## 2014-11-22 DIAGNOSIS — C569 Malignant neoplasm of unspecified ovary: Secondary | ICD-10-CM

## 2014-11-22 LAB — CA 125: CA 125: 2330 U/mL — ABNORMAL HIGH (ref <35)

## 2014-11-22 MED ORDER — IOHEXOL 300 MG/ML  SOLN
100.0000 mL | Freq: Once | INTRAMUSCULAR | Status: AC | PRN
  Administered 2014-11-22: 100 mL via INTRAVENOUS

## 2014-11-22 NOTE — Telephone Encounter (Signed)
Per staff message and POF I have scheduled appts. Advised scheduler of appts. JMW  

## 2014-11-25 ENCOUNTER — Telehealth: Payer: Self-pay | Admitting: *Deleted

## 2014-11-25 NOTE — Addendum Note (Signed)
Addended by: Laureen Abrahams on: 11/25/2014 05:57 PM   Modules accepted: Orders, Medications

## 2014-11-25 NOTE — Telephone Encounter (Signed)
Per staff message and POF I have scheduled appts. Advised scheduler of appts. JMW  

## 2014-11-29 ENCOUNTER — Other Ambulatory Visit: Payer: Medicare Other

## 2014-11-29 ENCOUNTER — Ambulatory Visit: Payer: Medicare Other | Admitting: Oncology

## 2014-12-02 ENCOUNTER — Other Ambulatory Visit (HOSPITAL_BASED_OUTPATIENT_CLINIC_OR_DEPARTMENT_OTHER): Payer: Medicare Other

## 2014-12-02 ENCOUNTER — Ambulatory Visit (HOSPITAL_BASED_OUTPATIENT_CLINIC_OR_DEPARTMENT_OTHER): Payer: Medicare Other

## 2014-12-02 ENCOUNTER — Other Ambulatory Visit: Payer: Self-pay | Admitting: Oncology

## 2014-12-02 DIAGNOSIS — Z5111 Encounter for antineoplastic chemotherapy: Secondary | ICD-10-CM

## 2014-12-02 DIAGNOSIS — C569 Malignant neoplasm of unspecified ovary: Secondary | ICD-10-CM

## 2014-12-02 DIAGNOSIS — Z5112 Encounter for antineoplastic immunotherapy: Secondary | ICD-10-CM

## 2014-12-02 LAB — COMPREHENSIVE METABOLIC PANEL (CC13)
ALK PHOS: 73 U/L (ref 40–150)
ALT: 12 U/L (ref 0–55)
AST: 17 U/L (ref 5–34)
Albumin: 3.5 g/dL (ref 3.5–5.0)
Anion Gap: 9 mEq/L (ref 3–11)
BILIRUBIN TOTAL: 0.39 mg/dL (ref 0.20–1.20)
BUN: 17.1 mg/dL (ref 7.0–26.0)
CO2: 25 mEq/L (ref 22–29)
Calcium: 9 mg/dL (ref 8.4–10.4)
Chloride: 107 mEq/L (ref 98–109)
Creatinine: 0.8 mg/dL (ref 0.6–1.1)
EGFR: 70 mL/min/{1.73_m2} — AB (ref >90)
GLUCOSE: 89 mg/dL (ref 70–140)
POTASSIUM: 4.2 meq/L (ref 3.5–5.1)
Sodium: 141 mEq/L (ref 136–145)
Total Protein: 6.3 g/dL — ABNORMAL LOW (ref 6.4–8.3)

## 2014-12-02 LAB — CBC WITH DIFFERENTIAL/PLATELET
BASO%: 0.6 % (ref 0.0–2.0)
BASOS ABS: 0 10*3/uL (ref 0.0–0.1)
EOS ABS: 0 10*3/uL (ref 0.0–0.5)
EOS%: 1.2 % (ref 0.0–7.0)
HCT: 35.2 % (ref 34.8–46.6)
HEMOGLOBIN: 11.3 g/dL — AB (ref 11.6–15.9)
LYMPH%: 31.5 % (ref 14.0–49.7)
MCH: 30.7 pg (ref 25.1–34.0)
MCHC: 32 g/dL (ref 31.5–36.0)
MCV: 95.9 fL (ref 79.5–101.0)
MONO#: 0.4 10*3/uL (ref 0.1–0.9)
MONO%: 9.8 % (ref 0.0–14.0)
NEUT%: 56.9 % (ref 38.4–76.8)
NEUTROS ABS: 2.4 10*3/uL (ref 1.5–6.5)
PLATELETS: 143 10*3/uL — AB (ref 145–400)
RBC: 3.67 10*6/uL — ABNORMAL LOW (ref 3.70–5.45)
RDW: 17 % — ABNORMAL HIGH (ref 11.2–14.5)
WBC: 4.3 10*3/uL (ref 3.9–10.3)
lymph#: 1.3 10*3/uL (ref 0.9–3.3)

## 2014-12-02 LAB — UA PROTEIN, DIPSTICK - CHCC: Protein, ur: <30 mg/dL

## 2014-12-02 MED ORDER — ONDANSETRON 8 MG/50ML IVPB (CHCC)
8.0000 mg | Freq: Once | INTRAVENOUS | Status: AC
  Administered 2014-12-02: 8 mg via INTRAVENOUS

## 2014-12-02 MED ORDER — SODIUM CHLORIDE 0.9 % IV SOLN
14.5000 mg/kg | Freq: Once | INTRAVENOUS | Status: AC
  Administered 2014-12-02: 800 mg via INTRAVENOUS
  Filled 2014-12-02: qty 32

## 2014-12-02 MED ORDER — SODIUM CHLORIDE 0.9 % IV SOLN
600.0000 mg/m2 | Freq: Once | INTRAVENOUS | Status: AC
  Administered 2014-12-02: 920 mg via INTRAVENOUS
  Filled 2014-12-02: qty 46

## 2014-12-02 MED ORDER — ONDANSETRON 8 MG/NS 50 ML IVPB
INTRAVENOUS | Status: AC
Start: 2014-12-02 — End: 2014-12-02
  Filled 2014-12-02: qty 8

## 2014-12-02 MED ORDER — DEXAMETHASONE SODIUM PHOSPHATE 10 MG/ML IJ SOLN
10.0000 mg | Freq: Once | INTRAMUSCULAR | Status: AC
  Administered 2014-12-02: 10 mg via INTRAVENOUS

## 2014-12-02 MED ORDER — SODIUM CHLORIDE 0.9 % IV SOLN
Freq: Once | INTRAVENOUS | Status: AC
  Administered 2014-12-02: 16:00:00 via INTRAVENOUS

## 2014-12-02 MED ORDER — DEXAMETHASONE SODIUM PHOSPHATE 10 MG/ML IJ SOLN
INTRAMUSCULAR | Status: AC
Start: 2014-12-02 — End: 2014-12-02
  Filled 2014-12-02: qty 1

## 2014-12-02 NOTE — Patient Instructions (Addendum)
[facility] Discharge Instructions for Patients Receiving Chemotherapy  Today you received the following chemotherapy agents Cytoxan and Avastin To help prevent nausea and vomiting after your treatment, we encourage you to take your nausea medication: Take one as needed for nausea. If you develop nausea and vomiting that is not controlled by your nausea medication, call the clinic.    BELOW ARE SYMPTOMS THAT SHOULD BE REPORTED IMMEDIATELY:  *FEVER GREATER THAN 100.5 F  *CHILLS WITH OR WITHOUT FEVER  NAUSEA AND VOMITING THAT IS NOT CONTROLLED WITH YOUR NAUSEA MEDICATION  *UNUSUAL SHORTNESS OF BREATH  *UNUSUAL BRUISING OR BLEEDING  TENDERNESS IN MOUTH AND THROAT WITH OR WITHOUT PRESENCE OF ULCERS  *URINARY PROBLEMS  *BOWEL PROBLEMS  UNUSUAL RASH Items with * indicate a potential emergency and should be followed up as soon as possible.  Feel free to call the clinic you have any questions or concerns. The clinic phone number is [phone].   Bevacizumab injection What is this medicine? BEVACIZUMAB (be va SIZ yoo mab) is a chemotherapy drug. It targets a protein found in many cancer cell types, and halts cancer growth. This drug treats many cancers including non-small cell lung cancer, ovarian cancer, cervical cancer, and colon or rectal cancer. It is usually given with other chemotherapy drugs. This medicine may be used for other purposes; ask your health care provider or pharmacist if you have questions. COMMON BRAND NAME(S): Avastin What should I tell my health care provider before I take this medicine? They need to know if you have any of these conditions: -blood clots -heart disease, including heart failure, heart attack, or chest pain (angina) -high blood pressure -infection (especially a virus infection such as chickenpox, cold sores, or herpes) -kidney disease -lung disease -prior chemotherapy with doxorubicin, daunorubicin, epirubicin, or other  anthracycline type chemotherapy agents -recent or ongoing radiation therapy -recent surgery -stroke -an unusual or allergic reaction to bevacizumab, hamster proteins, mouse proteins, other medicines, foods, dyes, or preservatives -pregnant or trying to get pregnant -breast-feeding How should I use this medicine? This medicine is for infusion into a vein. It is given by a health care professional in a hospital or clinic setting. Talk to your pediatrician regarding the use of this medicine in children. Special care may be needed. Overdosage: If you think you have taken too much of this medicine contact a poison control center or emergency room at once. NOTE: This medicine is only for you. Do not share this medicine with others. What if I miss a dose? It is important not to miss your dose. Call your doctor or health care professional if you are unable to keep an appointment. What may interact with this medicine? Interactions are not expected. This list may not describe all possible interactions. Give your health care provider a list of all the medicines, herbs, non-prescription drugs, or dietary supplements you use. Also tell them if you smoke, drink alcohol, or use illegal drugs. Some items may interact with your medicine. What should I watch for while using this medicine? Your condition will be monitored carefully while you are receiving this medicine. You will need important blood work and urine testing done while you are taking this medicine. During your treatment, let your health care professional know if you have any unusual symptoms, such as difficulty breathing. This medicine may rarely cause 'gastrointestinal perforation' (holes in the stomach, intestines or colon), a serious side effect requiring surgery to repair. This medicine should be started at least 28 days following  major surgery and the site of the surgery should be totally healed. Check with your doctor before scheduling dental  work or surgery while you are receiving this treatment. Talk to your doctor if you have recently had surgery or if you have a wound that has not healed. Do not become pregnant while taking this medicine. Women should inform their doctor if they wish to become pregnant or think they might be pregnant. There is a potential for serious side effects to an unborn child. Talk to your health care professional or pharmacist for more information. Do not breast-feed an infant while taking this medicine. This medicine has caused ovarian failure in some women. This medicine may interfere with the ability to have a child. You should talk to your doctor or health care professional if you are concerned about your fertility. What side effects may I notice from receiving this medicine? Side effects that you should report to your doctor or health care professional as soon as possible: -allergic reactions like skin rash, itching or hives, swelling of the face, lips, or tongue -signs of infection - fever or chills, cough, sore throat, pain or trouble passing urine -signs of decreased platelets or bleeding - bruising, pinpoint red spots on the skin, black, tarry stools, nosebleeds, blood in the urine -breathing problems -changes in vision -chest pain -confusion -jaw pain, especially after dental work -mouth sores -seizures -severe abdominal pain -severe headache -sudden numbness or weakness of the face, arm or leg -swelling of legs or ankles -symptoms of a stroke: change in mental awareness, inability to talk or move one side of the body (especially in patients with lung cancer) -trouble passing urine or change in the amount of urine -trouble speaking or understanding -trouble walking, dizziness, loss of balance or coordination Side effects that usually do not require medical attention (report to your doctor or health care professional if they continue or are bothersome): -constipation -diarrhea -dry  skin -headache -loss of appetite -nausea, vomiting This list may not describe all possible side effects. Call your doctor for medical advice about side effects. You may report side effects to FDA at 1-800-FDA-1088. Where should I keep my medicine? This drug is given in a hospital or clinic and will not be stored at home. NOTE: This sheet is a summary. It may not cover all possible information. If you have questions about this medicine, talk to your doctor, pharmacist, or health care provider.  2015, Elsevier/Gold Standard. (2013-11-06 11:38:34)

## 2014-12-04 ENCOUNTER — Telehealth: Payer: Self-pay | Admitting: *Deleted

## 2014-12-04 NOTE — Telephone Encounter (Signed)
Called Cassandra Nichols for chemotherapy F/U.  Patient is doing well.  Denies n/v.  Denies any new side effects or symptoms.  Bowel and bladder is functioning well.  Eating and drinking well and I instructed to drink 64 oz minimum daily or at least the day before, of and after treatment.  Denies questions at this time and encouraged to call if needed.  Reviewed how to call after hours in the case of an emergency.  Reports she will discuss decadron reduction at next visit.  "I can't regain the weight I've used weight watchers to loose."  Received 10 mg dose Monday.

## 2014-12-04 NOTE — Telephone Encounter (Signed)
-----   Message from Ludwig Lean, [place] sent at 12/03/2014  2:06 PM EST ----- Regarding: chemo follow up call Late entry- First avastin (with Cytoxan- pt has had before) Magrinat.

## 2014-12-10 ENCOUNTER — Other Ambulatory Visit: Payer: Medicare Other

## 2014-12-10 ENCOUNTER — Ambulatory Visit: Payer: Medicare Other

## 2014-12-10 ENCOUNTER — Ambulatory Visit: Payer: Medicare Other | Admitting: Oncology

## 2014-12-16 ENCOUNTER — Telehealth: Payer: Self-pay | Admitting: *Deleted

## 2014-12-16 NOTE — Telephone Encounter (Signed)
This RN received message from pt stating she needs to reschedule appointments on 12/23/2014 to later in the month due to being called in for job that has mandatory training on same day.  Per message - Pooja Camuso states she would like to reschedule ideally to the 22nd and if " Dr Gus " feels not appropriate she could do the 15th.  She is requesting lab at 245 with treatment to follow per her work schedule.  Presently Katelan Hirt is in [place] at her daughter's " just having a family packed holidy "  She hopes everyone had a Merry Christmas and " feel like the [place] is going to be a good one ".  Pt will return from [place] on the 30th- and can be reached at that time to for rescheduling.

## 2014-12-23 ENCOUNTER — Other Ambulatory Visit: Payer: Self-pay | Admitting: *Deleted

## 2014-12-23 ENCOUNTER — Ambulatory Visit: Payer: Medicare Other

## 2014-12-23 ENCOUNTER — Other Ambulatory Visit: Payer: Medicare Other

## 2014-12-23 ENCOUNTER — Ambulatory Visit: Payer: Medicare Other | Admitting: Nurse Practitioner

## 2014-12-27 ENCOUNTER — Other Ambulatory Visit: Payer: Self-pay | Admitting: *Deleted

## 2014-12-31 ENCOUNTER — Ambulatory Visit: Payer: Self-pay

## 2015-01-01 ENCOUNTER — Telehealth: Payer: Self-pay | Admitting: Oncology

## 2015-01-09 ENCOUNTER — Telehealth: Payer: Self-pay | Admitting: *Deleted

## 2015-01-09 ENCOUNTER — Telehealth: Payer: Self-pay | Admitting: Oncology

## 2015-01-09 NOTE — Telephone Encounter (Signed)
lvm for pt regarding to 1.26 appt per pof and pt request...Marland KitchenMarland Kitchen

## 2015-01-09 NOTE — Telephone Encounter (Signed)
Called to cancel the 01-10-2015 appointment due to weather.  Request 01-14-2015 at 2:45 and 3:15, would like to keep the 02-03-2015 and schedule the next treatment on 02-21-2015.  All dates with times of 2:45 and 3:15.  Will notify schedulers.

## 2015-01-10 ENCOUNTER — Ambulatory Visit: Payer: Self-pay

## 2015-01-10 ENCOUNTER — Other Ambulatory Visit: Payer: Self-pay

## 2015-01-13 ENCOUNTER — Ambulatory Visit: Payer: Medicare Other

## 2015-01-13 ENCOUNTER — Other Ambulatory Visit: Payer: Medicare Other

## 2015-01-14 ENCOUNTER — Other Ambulatory Visit: Payer: Self-pay | Admitting: *Deleted

## 2015-01-14 ENCOUNTER — Ambulatory Visit (HOSPITAL_BASED_OUTPATIENT_CLINIC_OR_DEPARTMENT_OTHER): Payer: Medicare Other

## 2015-01-14 ENCOUNTER — Other Ambulatory Visit (HOSPITAL_BASED_OUTPATIENT_CLINIC_OR_DEPARTMENT_OTHER): Payer: Medicare Other

## 2015-01-14 DIAGNOSIS — C569 Malignant neoplasm of unspecified ovary: Secondary | ICD-10-CM

## 2015-01-14 DIAGNOSIS — Z5112 Encounter for antineoplastic immunotherapy: Secondary | ICD-10-CM

## 2015-01-14 DIAGNOSIS — Z5111 Encounter for antineoplastic chemotherapy: Secondary | ICD-10-CM

## 2015-01-14 LAB — CBC WITH DIFFERENTIAL/PLATELET
BASO%: 0.4 % (ref 0.0–2.0)
Basophils Absolute: 0 10*3/uL (ref 0.0–0.1)
EOS ABS: 0 10*3/uL (ref 0.0–0.5)
EOS%: 0.6 % (ref 0.0–7.0)
HEMATOCRIT: 38.5 % (ref 34.8–46.6)
HGB: 12.5 g/dL (ref 11.6–15.9)
LYMPH%: 23.6 % (ref 14.0–49.7)
MCH: 30.6 pg (ref 25.1–34.0)
MCHC: 32.5 g/dL (ref 31.5–36.0)
MCV: 94.1 fL (ref 79.5–101.0)
MONO#: 0.4 10*3/uL (ref 0.1–0.9)
MONO%: 8.5 % (ref 0.0–14.0)
NEUT%: 66.9 % (ref 38.4–76.8)
NEUTROS ABS: 3.3 10*3/uL (ref 1.5–6.5)
Platelets: 133 10*3/uL — ABNORMAL LOW (ref 145–400)
RBC: 4.09 10*6/uL (ref 3.70–5.45)
RDW: 14.9 % — ABNORMAL HIGH (ref 11.2–14.5)
WBC: 5 10*3/uL (ref 3.9–10.3)
lymph#: 1.2 10*3/uL (ref 0.9–3.3)

## 2015-01-14 LAB — COMPREHENSIVE METABOLIC PANEL (CC13)
ALT: 11 U/L (ref 0–55)
AST: 18 U/L (ref 5–34)
Albumin: 3.9 g/dL (ref 3.5–5.0)
Alkaline Phosphatase: 66 U/L (ref 40–150)
Anion Gap: 10 mEq/L (ref 3–11)
BILIRUBIN TOTAL: 0.5 mg/dL (ref 0.20–1.20)
BUN: 15 mg/dL (ref 7.0–26.0)
CHLORIDE: 105 meq/L (ref 98–109)
CO2: 25 mEq/L (ref 22–29)
CREATININE: 0.8 mg/dL (ref 0.6–1.1)
Calcium: 9.3 mg/dL (ref 8.4–10.4)
EGFR: 74 mL/min/{1.73_m2} — AB (ref >90)
Glucose: 88 mg/dl (ref 70–140)
Potassium: 4.3 mEq/L (ref 3.5–5.1)
SODIUM: 140 meq/L (ref 136–145)
TOTAL PROTEIN: 6.7 g/dL (ref 6.4–8.3)

## 2015-01-14 LAB — UA PROTEIN, DIPSTICK - CHCC: Protein, ur: NEGATIVE mg/dL

## 2015-01-14 MED ORDER — DEXAMETHASONE SODIUM PHOSPHATE 10 MG/ML IJ SOLN
INTRAMUSCULAR | Status: AC
  Filled 2015-01-14: qty 1

## 2015-01-14 MED ORDER — SODIUM CHLORIDE 0.9 % IV SOLN
Freq: Once | INTRAVENOUS | Status: AC
  Administered 2015-01-14: 16:00:00 via INTRAVENOUS

## 2015-01-14 MED ORDER — HEPARIN SOD (PORK) LOCK FLUSH 100 UNIT/ML IV SOLN
500.0000 [IU] | Freq: Once | INTRAVENOUS | Status: AC | PRN
  Administered 2015-01-14: 500 [IU]
  Filled 2015-01-14: qty 5

## 2015-01-14 MED ORDER — DEXAMETHASONE SODIUM PHOSPHATE 10 MG/ML IJ SOLN
10.0000 mg | Freq: Once | INTRAMUSCULAR | Status: AC
  Administered 2015-01-14: 10 mg via INTRAVENOUS

## 2015-01-14 MED ORDER — SODIUM CHLORIDE 0.9 % IV SOLN
600.0000 mg/m2 | Freq: Once | INTRAVENOUS | Status: AC
  Administered 2015-01-14: 920 mg via INTRAVENOUS
  Filled 2015-01-14: qty 46

## 2015-01-14 MED ORDER — ONDANSETRON 8 MG/NS 50 ML IVPB
INTRAVENOUS | Status: AC
  Filled 2015-01-14: qty 8

## 2015-01-14 MED ORDER — ONDANSETRON 8 MG/50ML IVPB (CHCC)
8.0000 mg | Freq: Once | INTRAVENOUS | Status: AC
  Administered 2015-01-14: 8 mg via INTRAVENOUS

## 2015-01-14 MED ORDER — SODIUM CHLORIDE 0.9 % IV SOLN
800.0000 mg | Freq: Once | INTRAVENOUS | Status: AC
  Administered 2015-01-14: 800 mg via INTRAVENOUS
  Filled 2015-01-14: qty 32

## 2015-01-14 MED ORDER — SODIUM CHLORIDE 0.9 % IJ SOLN
10.0000 mL | INTRAMUSCULAR | Status: DC | PRN
Start: 2015-01-14 — End: 2015-01-14
  Administered 2015-01-14: 10 mL
  Filled 2015-01-14: qty 10

## 2015-01-14 NOTE — Patient Instructions (Signed)
[facility] Discharge Instructions for Patients Receiving Chemotherapy  Today you received the following chemotherapy agents Avastin and Cytoxan.  To help prevent nausea and vomiting after your treatment, we encourage you to take your nausea medication as prescribed.   If you develop nausea and vomiting that is not controlled by your nausea medication, call the clinic.   BELOW ARE SYMPTOMS THAT SHOULD BE REPORTED IMMEDIATELY:  *FEVER GREATER THAN 100.5 F  *CHILLS WITH OR WITHOUT FEVER  NAUSEA AND VOMITING THAT IS NOT CONTROLLED WITH YOUR NAUSEA MEDICATION  *UNUSUAL SHORTNESS OF BREATH  *UNUSUAL BRUISING OR BLEEDING  TENDERNESS IN MOUTH AND THROAT WITH OR WITHOUT PRESENCE OF ULCERS  *URINARY PROBLEMS  *BOWEL PROBLEMS  UNUSUAL RASH Items with * indicate a potential emergency and should be followed up as soon as possible.  Feel free to call the clinic you have any questions or concerns. The clinic phone number is [phone].

## 2015-01-15 LAB — CA 125: CA 125: 2733 U/mL — ABNORMAL HIGH (ref <35)

## 2015-01-31 ENCOUNTER — Telehealth: Payer: Self-pay | Admitting: *Deleted

## 2015-01-31 NOTE — Telephone Encounter (Signed)
This RN received message from pt stating " I am on a break from work and can come in anytime but ideally would love to come in the morning on the 17th ( Feb ) to see Dr Jana Hakim."  Pt's message was unclear about noted appointments on 2/15 for chemo.  This RN returned call and obtained identified VM- message left requesting pt to return call to clarify what appointments she needs.

## 2015-02-03 ENCOUNTER — Other Ambulatory Visit: Payer: Medicare Other

## 2015-02-03 ENCOUNTER — Ambulatory Visit: Payer: Medicare Other

## 2015-02-03 ENCOUNTER — Other Ambulatory Visit: Payer: Self-pay | Admitting: Emergency Medicine

## 2015-02-04 ENCOUNTER — Telehealth: Payer: Self-pay | Admitting: Nurse Practitioner

## 2015-02-04 ENCOUNTER — Telehealth: Payer: Self-pay | Admitting: *Deleted

## 2015-02-04 NOTE — Telephone Encounter (Signed)
Per staff message and POF I have scheduled appts. Advised scheduler of appts and to move lab . JMW  

## 2015-02-04 NOTE — Telephone Encounter (Signed)
Labs/infusion added per 02/16 POF, s/w pt confirming information of new schedule...Marland KitchenMarland Kitchen

## 2015-02-04 NOTE — Telephone Encounter (Signed)
Sent msg to add chemo per 02/15 POF, will call pt once scheduled.... KJ

## 2015-02-05 ENCOUNTER — Other Ambulatory Visit (HOSPITAL_BASED_OUTPATIENT_CLINIC_OR_DEPARTMENT_OTHER): Payer: Medicare Other

## 2015-02-05 ENCOUNTER — Other Ambulatory Visit: Payer: Self-pay | Admitting: *Deleted

## 2015-02-05 ENCOUNTER — Ambulatory Visit (HOSPITAL_BASED_OUTPATIENT_CLINIC_OR_DEPARTMENT_OTHER): Payer: Medicare Other

## 2015-02-05 DIAGNOSIS — Z5111 Encounter for antineoplastic chemotherapy: Secondary | ICD-10-CM

## 2015-02-05 DIAGNOSIS — C569 Malignant neoplasm of unspecified ovary: Secondary | ICD-10-CM

## 2015-02-05 DIAGNOSIS — Z5112 Encounter for antineoplastic immunotherapy: Secondary | ICD-10-CM

## 2015-02-05 LAB — CBC WITH DIFFERENTIAL/PLATELET
BASO%: 0.6 % (ref 0.0–2.0)
BASOS ABS: 0 10*3/uL (ref 0.0–0.1)
EOS%: 0.4 % (ref 0.0–7.0)
Eosinophils Absolute: 0 10*3/uL (ref 0.0–0.5)
HCT: 39.9 % (ref 34.8–46.6)
HGB: 12.6 g/dL (ref 11.6–15.9)
LYMPH%: 16.9 % (ref 14.0–49.7)
MCH: 29.6 pg (ref 25.1–34.0)
MCHC: 31.5 g/dL (ref 31.5–36.0)
MCV: 93.8 fL (ref 79.5–101.0)
MONO#: 0.4 10*3/uL (ref 0.1–0.9)
MONO%: 7.9 % (ref 0.0–14.0)
NEUT#: 4 10*3/uL (ref 1.5–6.5)
NEUT%: 74.2 % (ref 38.4–76.8)
Platelets: 141 10*3/uL — ABNORMAL LOW (ref 145–400)
RBC: 4.26 10*6/uL (ref 3.70–5.45)
RDW: 15.3 % — ABNORMAL HIGH (ref 11.2–14.5)
WBC: 5.3 10*3/uL (ref 3.9–10.3)
lymph#: 0.9 10*3/uL (ref 0.9–3.3)

## 2015-02-05 LAB — COMPREHENSIVE METABOLIC PANEL (CC13)
ALK PHOS: 79 U/L (ref 40–150)
ALT: 9 U/L (ref 0–55)
AST: 17 U/L (ref 5–34)
Albumin: 3.7 g/dL (ref 3.5–5.0)
Anion Gap: 10 mEq/L (ref 3–11)
BUN: 14.4 mg/dL (ref 7.0–26.0)
CALCIUM: 9 mg/dL (ref 8.4–10.4)
CO2: 24 mEq/L (ref 22–29)
CREATININE: 0.8 mg/dL (ref 0.6–1.1)
Chloride: 106 mEq/L (ref 98–109)
EGFR: 74 mL/min/{1.73_m2} — ABNORMAL LOW (ref >90)
Glucose: 104 mg/dl (ref 70–140)
POTASSIUM: 3.8 meq/L (ref 3.5–5.1)
Sodium: 140 mEq/L (ref 136–145)
Total Bilirubin: 0.51 mg/dL (ref 0.20–1.20)
Total Protein: 6.7 g/dL (ref 6.4–8.3)

## 2015-02-05 LAB — UA PROTEIN, DIPSTICK - CHCC: PROTEIN: NEGATIVE mg/dL

## 2015-02-05 MED ORDER — SODIUM CHLORIDE 0.9 % IJ SOLN
3.0000 mL | INTRAMUSCULAR | Status: DC | PRN
  Filled 2015-02-05: qty 10

## 2015-02-05 MED ORDER — SODIUM CHLORIDE 0.9 % IV SOLN
600.0000 mg/m2 | Freq: Once | INTRAVENOUS | Status: AC
  Administered 2015-02-05: 920 mg via INTRAVENOUS
  Filled 2015-02-05: qty 46

## 2015-02-05 MED ORDER — SODIUM CHLORIDE 0.9 % IJ SOLN
10.0000 mL | INTRAMUSCULAR | Status: DC | PRN
  Administered 2015-02-05: 10 mL
  Filled 2015-02-05: qty 10

## 2015-02-05 MED ORDER — DEXAMETHASONE SODIUM PHOSPHATE 10 MG/ML IJ SOLN
10.0000 mg | Freq: Once | INTRAMUSCULAR | Status: AC
  Administered 2015-02-05: 10 mg via INTRAVENOUS

## 2015-02-05 MED ORDER — SODIUM CHLORIDE 0.9 % IV SOLN
Freq: Once | INTRAVENOUS | Status: AC
  Administered 2015-02-05: 15:00:00 via INTRAVENOUS

## 2015-02-05 MED ORDER — ONDANSETRON 8 MG/50ML IVPB (CHCC)
8.0000 mg | Freq: Once | INTRAVENOUS | Status: AC
  Administered 2015-02-05: 8 mg via INTRAVENOUS

## 2015-02-05 MED ORDER — ONDANSETRON 8 MG/NS 50 ML IVPB
INTRAVENOUS | Status: AC
  Filled 2015-02-05: qty 8

## 2015-02-05 MED ORDER — DEXAMETHASONE SODIUM PHOSPHATE 10 MG/ML IJ SOLN
INTRAMUSCULAR | Status: AC
  Filled 2015-02-05: qty 1

## 2015-02-05 MED ORDER — HEPARIN SOD (PORK) LOCK FLUSH 100 UNIT/ML IV SOLN
500.0000 [IU] | Freq: Once | INTRAVENOUS | Status: AC | PRN
  Administered 2015-02-05: 500 [IU]
  Filled 2015-02-05: qty 5

## 2015-02-05 MED ORDER — SODIUM CHLORIDE 0.9 % IV SOLN
14.6000 mg/kg | Freq: Once | INTRAVENOUS | Status: AC
  Administered 2015-02-05: 800 mg via INTRAVENOUS
  Filled 2015-02-05: qty 32

## 2015-02-05 MED ORDER — HEPARIN SOD (PORK) LOCK FLUSH 100 UNIT/ML IV SOLN
250.0000 [IU] | Freq: Once | INTRAVENOUS | Status: DC | PRN
  Filled 2015-02-05: qty 5

## 2015-02-05 NOTE — Patient Instructions (Signed)
[facility] Discharge Instructions for Patients Receiving Chemotherapy  Today you received the following chemotherapy agents Avastin, Cytoxan  To help prevent nausea and vomiting after your treatment, we encourage you to take your nausea medication as directed.   If you develop nausea and vomiting that is not controlled by your nausea medication, call the clinic.   BELOW ARE SYMPTOMS THAT SHOULD BE REPORTED IMMEDIATELY:  *FEVER GREATER THAN 100.5 F  *CHILLS WITH OR WITHOUT FEVER  NAUSEA AND VOMITING THAT IS NOT CONTROLLED WITH YOUR NAUSEA MEDICATION  *UNUSUAL SHORTNESS OF BREATH  *UNUSUAL BRUISING OR BLEEDING  TENDERNESS IN MOUTH AND THROAT WITH OR WITHOUT PRESENCE OF ULCERS  *URINARY PROBLEMS  *BOWEL PROBLEMS  UNUSUAL RASH Items with * indicate a potential emergency and should be followed up as soon as possible.  Feel free to call the clinic you have any questions or concerns. The clinic phone number is [phone].

## 2015-02-06 LAB — CA 125: CA 125: 2614 U/mL — ABNORMAL HIGH (ref <35)

## 2015-02-21 ENCOUNTER — Telehealth: Payer: Self-pay | Admitting: Nurse Practitioner

## 2015-02-21 ENCOUNTER — Ambulatory Visit (HOSPITAL_BASED_OUTPATIENT_CLINIC_OR_DEPARTMENT_OTHER): Payer: Medicare Other | Admitting: Nurse Practitioner

## 2015-02-21 ENCOUNTER — Encounter: Payer: Self-pay | Admitting: Nurse Practitioner

## 2015-02-21 ENCOUNTER — Ambulatory Visit: Payer: Medicare Other

## 2015-02-21 ENCOUNTER — Other Ambulatory Visit (HOSPITAL_BASED_OUTPATIENT_CLINIC_OR_DEPARTMENT_OTHER): Payer: Medicare Other

## 2015-02-21 ENCOUNTER — Other Ambulatory Visit: Payer: Self-pay | Admitting: *Deleted

## 2015-02-21 VITALS — BP 154/92 | HR 77 | Temp 97.9°F | Resp 18 | Wt 123.1 lb

## 2015-02-21 DIAGNOSIS — C569 Malignant neoplasm of unspecified ovary: Secondary | ICD-10-CM

## 2015-02-21 LAB — CBC WITH DIFFERENTIAL/PLATELET
BASO%: 0.9 % (ref 0.0–2.0)
BASOS ABS: 0 10*3/uL (ref 0.0–0.1)
EOS%: 0.5 % (ref 0.0–7.0)
Eosinophils Absolute: 0 10*3/uL (ref 0.0–0.5)
HCT: 36.7 % (ref 34.8–46.6)
HEMOGLOBIN: 12.2 g/dL (ref 11.6–15.9)
LYMPH%: 38.6 % (ref 14.0–49.7)
MCH: 30.6 pg (ref 25.1–34.0)
MCHC: 33.2 g/dL (ref 31.5–36.0)
MCV: 92 fL (ref 79.5–101.0)
MONO#: 0.4 10*3/uL (ref 0.1–0.9)
MONO%: 17.7 % — AB (ref 0.0–14.0)
NEUT#: 0.9 10*3/uL — ABNORMAL LOW (ref 1.5–6.5)
NEUT%: 42.3 % (ref 38.4–76.8)
PLATELETS: 109 10*3/uL — AB (ref 145–400)
RBC: 3.99 10*6/uL (ref 3.70–5.45)
RDW: 15.3 % — AB (ref 11.2–14.5)
WBC: 2.2 10*3/uL — ABNORMAL LOW (ref 3.9–10.3)
lymph#: 0.9 10*3/uL (ref 0.9–3.3)

## 2015-02-21 LAB — COMPREHENSIVE METABOLIC PANEL (CC13)
ALBUMIN: 3.6 g/dL (ref 3.5–5.0)
ALT: 12 U/L (ref 0–55)
AST: 19 U/L (ref 5–34)
Alkaline Phosphatase: 72 U/L (ref 40–150)
Anion Gap: 10 mEq/L (ref 3–11)
BUN: 18.2 mg/dL (ref 7.0–26.0)
CO2: 22 mEq/L (ref 22–29)
Calcium: 9 mg/dL (ref 8.4–10.4)
Chloride: 107 mEq/L (ref 98–109)
Creatinine: 0.8 mg/dL (ref 0.6–1.1)
EGFR: 73 mL/min/{1.73_m2} — AB (ref >90)
GLUCOSE: 88 mg/dL (ref 70–140)
POTASSIUM: 4.2 meq/L (ref 3.5–5.1)
Sodium: 139 mEq/L (ref 136–145)
TOTAL PROTEIN: 6.2 g/dL — AB (ref 6.4–8.3)
Total Bilirubin: 0.54 mg/dL (ref 0.20–1.20)

## 2015-02-21 MED ORDER — LISINOPRIL 5 MG PO TABS: 5.0000 mg | ORAL_TABLET | Freq: Every day | ORAL | Status: DC

## 2015-02-21 NOTE — Progress Notes (Signed)
ID: [name]   DOB: 19-Aug-1941  MR#: [id number]  ZWC#:[id number]   PCP:  Marjorie Smolder, MD GYN:  [name], MD OTHER:  [name], MD  CHIEF COMPLAINT:  Recurrent Ovarian Cancer  CURRENT TREATMENT:  cyclophosphamide and carboplatin  NOTE: THIS PATIENT REQUESTS THAT ABSOLUTELY NO NUMERIC INFORMATION BE GIVEN REGARDING ANY LAB AND PARTICULARLY DOES NOT WANT TO KNOW SPECIFIC CA-125 RESULTS  HISTORY OF PRESENT ILLNESS: Cassandra Nichols was initially diagnosed with ovarian cancer in July of 2002. She underwent debulking surgery at that time for what proved to be a 4 of 18 lymph nodes positive disease. She was initially treated with 6 cycles of paclitaxel and carboplatin adjuvantly, completed in December of 2002.  Patient has had multiple recurrences since December of 2002, and has been treated with multiple agents as noted below.  INTERVAL HISTORY: Cassandra Nichols returns today for follow up of her ovarian cancer. She is on cyclophosphamide and carboplatin and is due for cycle 4. She had some gum pain and was prescribed a special mouthwash and toothpaste by her dentist which has tremendously improved the pain. The interval history is otherwise generally unremarkable. 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    REVIEW OF SYSTEMS: Cassandra Nichols denies fevers, chills, or changes in bowel or bladder habit. She has some mild queeziness after treatment, but this is managed well by her PRN antiemetics. Her appetite is healthy and she is drinking well. Her energy level is good during the day, and she sleeps well at night. She denies shortness of breath, chest pain, cough, or palpitations. She has no headaches, dizziness, unexplained weight loss, or night sweats. A detailed review of systems is otherwise stable.  PAST MEDICAL HISTORY: Past Medical History  Diagnosis Date  . Shingles   . Ovarian cancer  11/17/2011  . Cancer     PAST SURGICAL HISTORY: Past Surgical History  Procedure Laterality Date  . Ganglion cyst excision Left     Wrist    FAMILY HISTORY Family History  Problem Relation Age of Onset  . Hypertension Mother   . GER disease Mother   . Cancer Father     Cancer (stomach)    GYNECOLOGIC HISTORY: GXP2  SOCIAL HISTORY: (Updated 03/08/2014) Cassandra Nichols lives alone. She works temporary jobs, sometimes as a Oceanographer, and sometimes grading tests.  As of March 2015, she is working full-time hours at the [employer] center. She's primary caregiver for her and who is currently 30 and lives in [place].  Her daughter Cassandra Nichols lives in [place] and is Cassandra Nichols of the epidemiology and [place] there; she is married with 2 children. Son  Cassandra Nichols lives in [place], [place]. He has 4 children of his own.His wife is a Marine scientist  ADVANCED DIRECTIVES: in place, but the patient has not been able to find the documents. At the 08/15/2014 visit she was given the appropriate living will and health Department to you documents to complete and notarize at her discretion.  HEALTH MAINTENANCE:  (Updated  03/08/2014) History  Substance Use Topics  . Smoking status: Former Research scientist (life sciences)  . Smokeless tobacco: Never Used  . Alcohol Use: No     Comment: Occasionally     Colonoscopy: Not on file  PAP:   Bone density: Not on file  Lipid panel: Not on file  Mammogram:  Feb 2103, [facility]   Allergies  Allergen  Reactions  . Penicillins Rash    Current Outpatient Prescriptions  Medication Sig Dispense Refill  . loratadine (CLARITIN) 10 MG tablet Take 10 mg by mouth daily as needed for allergies.    . Melatonin 3 MG TABS Take 3 mg by mouth at bedtime as needed.    . Alum & Mag Hydroxide-Simeth (MAGIC MOUTHWASH W/LIDOCAINE) SOLN Take 5 mLs by mouth 4 (four) times daily as needed. (Patient not taking: Reported on 02/21/2015) 240 mL 2  . dexamethasone (DECADRON) 4 MG tablet Take 4 mg by mouth as needed.     Marland Kitchen lisinopril (PRINIVIL,ZESTRIL) 5 MG tablet Take 1 tablet (5 mg total) by mouth daily. 30 tablet 4  . [DISCONTINUED] prochlorperazine (COMPAZINE) 10 MG tablet Take 1 tablet (10 mg total) by mouth every 6 (six) hours as needed (Nausea or vomiting). 30 tablet 1   No current facility-administered medications for this visit.   Facility-Administered Medications Ordered in Other Visits  Medication Dose Route Frequency Provider Last Rate Last Dose  . sodium chloride 0.9 % injection 10 mL  10 mL Intravenous PRN Chauncey Cruel, MD   10 mL at 05/24/14 1601    OBJECTIVE: Middle-aged white woman in no acute distress Filed Vitals:   02/21/15 1420  BP: 154/92  Pulse:   Temp:   Resp:      Body mass index is 24.85 kg/(m^2).    ECOG FS: 0  Current weight 122 pounds, usual range 119 to 128  Skin: warm, dry  HEENT: sclerae anicteric, conjunctivae pink, oropharynx clear. No thrush or mucositis.  Lymph Nodes: No cervical or supraclavicular lymphadenopathy  Lungs: clear to auscultation bilaterally, no rales, wheezes, or rhonci  Heart: regular rate and rhythm  Abdomen: round, soft, non tender, positive bowel sounds  Musculoskeletal: No focal spinal tenderness, no peripheral edema  Neuro: non focal, well oriented, positive affect  Breasts: deferred  LAB RESULTS:  Lab Results  Component Value Date   WBC 2.2* 02/21/2015   NEUTROABS 0.9* 02/21/2015   HGB 12.2 02/21/2015   HCT 36.7 02/21/2015   MCV 92.0 02/21/2015   PLT 109* 02/21/2015        Chemistry      Component Value Date/Time   NA 139 02/21/2015 1328   NA 140 07/04/2012 0832   K 4.2 02/21/2015 1328   K 4.2 07/04/2012 0832   CL 106 05/09/2013 1304   CL 106 07/04/2012 0832   CO2 22 02/21/2015 1328   CO2 26 07/04/2012 0832   BUN 18.2 02/21/2015 1328   BUN 17 07/04/2012 0832   CREATININE 0.8 02/21/2015 1328   CREATININE 0.82 07/04/2012 0832      Component Value Date/Time   CALCIUM 9.0 02/21/2015 1328   CALCIUM 9.2  07/04/2012 0832   ALKPHOS 72 02/21/2015 1328   ALKPHOS 71 07/04/2012 0832   AST 19 02/21/2015 1328   AST 18 07/04/2012 0832   ALT 12 02/21/2015 1328   ALT 12 07/04/2012 0832   BILITOT 0.54 02/21/2015 1328   BILITOT 0.7 07/04/2012 0832     Results for AUNDREYA, SOUFFRANT (MRN [id number]) as of 11/23/2014 10:19  Ref. Range 08/28/2014 11:01 08/28/2014 11:01 10/10/2014 13:45 10/10/2014 13:45 11/21/2014 15:48  CA 125 Latest Range: <35 U/mL 2270 (H) 1629.8 (H) 2835 (H) 1787.4 (H) 2330 (H)   STUDIES: No results found.  ASSESSMENT: 74 y.o.  Earth woman (1)  with a history of ovarian cancer dating back to July 2002 when she underwent debulking surgery for what proved to  be 4/18 lymph node positive disease.   (2)  She received 6 cycles of paclitaxel/carboplatin adjuvantly completed in December 2002.   (3)  She had her first recurrence in December 2005 treated with paclitaxel;   (4)  second recurrence May 2007 treated with [place] and paclitaxel;   (5)  third recurrence January 2010 treated with topotecan and Gemzar;   (6)  fourth recurrence January 2012 treated with single-agent carboplatin (January to August 2012) with initial response, then plateau; with progression January 2013 (less than 6 months after last carbo dose) treated with single-agent gemzar given Q2 weeks (January to April 2013), with evidence of progression  (7) Doxil strartedJuly 2013 6, with evidence of initial response, and fair tolerance. Held between May and August 2014 due to patient's work schedule and scheduling issues.  Resumed treatment as of 08/24/2013, with some delays secondary to the patient's schedule. As of early December 2014, her total cumulative dose of Doxil reached 560 mg per meter square. In addition there was also an upward trend of the CA 125. Doxil was discontinued (last dose 10/30/2013). Echocardiogram on 11/14/2013 showed a well preserved ejection fraction of 55-60%.  (8)  treated with single agent paclitaxel,  given on days one and 8 of each 21 day cycle, first dose on 12/27/2013,with continuing response, but mild to moderate toxicity; 11th and last dose 04/12/2014   (9) carboplatin and cyclophosphamide started on 08/28/14 given every 21 days-- stopped 10/10/2014 after a reaction to carboplatin (also no evidence of response)  (10) cyclophosphamide and bevacizumab to start 12/02/2014, to be repeated every 21 days  PLAN:  Cassandra Nichols is doing well today. The labs were reviewed in detail and were entirely stable. The CA 125 drawn today has not resulted yet, but the level drawn 2 weeks ago was trending downwards.  She is about 5 days too early to receive the cyclophosphamide and bevacizumab, and we would both prefer she receive her chemotherapy on time.  I consulted Dr. Jana Hakim regarding her blood pressure, and he agreed that it might be the bevacizumab. She suggested we start her on lisinopril daily to get this under control. We will continue to monitor her BP at future visits.   Cassandra Nichols will receive cycle 4 of treatment this upcoming Wednesday, then every 3 weeks from that point on Fridays. She will have repeat scans in June and will have a follow up visit to discuss the results. She understands and agrees with this plan. She has been encouraged to call with any issues that might arise before her next visit here.   Marcelino Duster, NP     02/21/2015

## 2015-02-21 NOTE — Telephone Encounter (Signed)
appts made and avs printed for pt  Cassandra °

## 2015-02-22 LAB — CA 125: CA 125: 2502 U/mL — ABNORMAL HIGH (ref <35)

## 2015-02-24 ENCOUNTER — Other Ambulatory Visit: Payer: Medicare Other

## 2015-02-24 NOTE — Addendum Note (Signed)
Addended by: [employer] D on: 02/24/2015 04:07 PM   Modules accepted: Medications

## 2015-02-26 ENCOUNTER — Other Ambulatory Visit: Payer: Self-pay | Admitting: Emergency Medicine

## 2015-02-26 ENCOUNTER — Ambulatory Visit (HOSPITAL_BASED_OUTPATIENT_CLINIC_OR_DEPARTMENT_OTHER): Payer: Medicare Other

## 2015-02-26 ENCOUNTER — Other Ambulatory Visit: Payer: Self-pay | Admitting: Oncology

## 2015-02-26 DIAGNOSIS — C569 Malignant neoplasm of unspecified ovary: Secondary | ICD-10-CM

## 2015-02-26 DIAGNOSIS — Z5112 Encounter for antineoplastic immunotherapy: Secondary | ICD-10-CM

## 2015-02-26 DIAGNOSIS — Z5111 Encounter for antineoplastic chemotherapy: Secondary | ICD-10-CM

## 2015-02-26 LAB — CBC WITH DIFFERENTIAL/PLATELET
BASO%: 0.5 % (ref 0.0–2.0)
Basophils Absolute: 0 10*3/uL (ref 0.0–0.1)
EOS%: 0.7 % (ref 0.0–7.0)
Eosinophils Absolute: 0 10*3/uL (ref 0.0–0.5)
HCT: 37.6 % (ref 34.8–46.6)
HGB: 12.5 g/dL (ref 11.6–15.9)
LYMPH%: 26.1 % (ref 14.0–49.7)
MCH: 30.5 pg (ref 25.1–34.0)
MCHC: 33.2 g/dL (ref 31.5–36.0)
MCV: 91.7 fL (ref 79.5–101.0)
MONO#: 0.6 10*3/uL (ref 0.1–0.9)
MONO%: 12.9 % (ref 0.0–14.0)
NEUT%: 59.8 % (ref 38.4–76.8)
NEUTROS ABS: 2.5 10*3/uL (ref 1.5–6.5)
PLATELETS: 114 10*3/uL — AB (ref 145–400)
RBC: 4.1 10*6/uL (ref 3.70–5.45)
RDW: 15.5 % — ABNORMAL HIGH (ref 11.2–14.5)
WBC: 4.3 10*3/uL (ref 3.9–10.3)
lymph#: 1.1 10*3/uL (ref 0.9–3.3)
nRBC: 0 % (ref 0–0)

## 2015-02-26 MED ORDER — SODIUM CHLORIDE 0.9 % IV SOLN
Freq: Once | INTRAVENOUS | Status: AC
  Administered 2015-02-26: 16:00:00 via INTRAVENOUS
  Filled 2015-02-26: qty 4

## 2015-02-26 MED ORDER — BEVACIZUMAB CHEMO INJECTION 400 MG/16ML
14.5000 mg/kg | Freq: Once | INTRAVENOUS | Status: AC
  Administered 2015-02-26: 800 mg via INTRAVENOUS
  Filled 2015-02-26: qty 32

## 2015-02-26 MED ORDER — SODIUM CHLORIDE 0.9 % IV SOLN
Freq: Once | INTRAVENOUS | Status: AC
  Administered 2015-02-26: 16:00:00 via INTRAVENOUS

## 2015-02-26 MED ORDER — HEPARIN SOD (PORK) LOCK FLUSH 100 UNIT/ML IV SOLN
500.0000 [IU] | Freq: Once | INTRAVENOUS | Status: AC | PRN
  Administered 2015-02-26: 500 [IU]
  Filled 2015-02-26: qty 5

## 2015-02-26 MED ORDER — SODIUM CHLORIDE 0.9 % IV SOLN
600.0000 mg/m2 | Freq: Once | INTRAVENOUS | Status: AC
  Administered 2015-02-26: 920 mg via INTRAVENOUS
  Filled 2015-02-26: qty 46

## 2015-02-26 MED ORDER — SODIUM CHLORIDE 0.9 % IJ SOLN
10.0000 mL | INTRAMUSCULAR | Status: DC | PRN
Start: 2015-02-26 — End: 2015-02-26
  Administered 2015-02-26: 10 mL
  Filled 2015-02-26: qty 10

## 2015-03-21 ENCOUNTER — Other Ambulatory Visit (HOSPITAL_BASED_OUTPATIENT_CLINIC_OR_DEPARTMENT_OTHER): Payer: Medicare Other

## 2015-03-21 ENCOUNTER — Other Ambulatory Visit: Payer: Self-pay

## 2015-03-21 ENCOUNTER — Ambulatory Visit: Payer: Medicare Other

## 2015-03-21 DIAGNOSIS — I10 Essential (primary) hypertension: Secondary | ICD-10-CM | POA: Diagnosis not present

## 2015-03-21 DIAGNOSIS — C569 Malignant neoplasm of unspecified ovary: Secondary | ICD-10-CM

## 2015-03-21 LAB — COMPREHENSIVE METABOLIC PANEL (CC13)
ALT: 14 U/L (ref 0–55)
AST: 19 U/L (ref 5–34)
Albumin: 3.9 g/dL (ref 3.5–5.0)
Alkaline Phosphatase: 87 U/L (ref 40–150)
Anion Gap: 7 mEq/L (ref 3–11)
BUN: 19.1 mg/dL (ref 7.0–26.0)
CALCIUM: 9.8 mg/dL (ref 8.4–10.4)
CO2: 24 meq/L (ref 22–29)
Chloride: 109 mEq/L (ref 98–109)
Creatinine: 0.8 mg/dL (ref 0.6–1.1)
EGFR: 73 mL/min/{1.73_m2} — AB (ref >90)
GLUCOSE: 82 mg/dL (ref 70–140)
POTASSIUM: 4.3 meq/L (ref 3.5–5.1)
Sodium: 141 mEq/L (ref 136–145)
Total Bilirubin: 0.59 mg/dL (ref 0.20–1.20)
Total Protein: 6.7 g/dL (ref 6.4–8.3)

## 2015-03-21 LAB — CBC WITH DIFFERENTIAL/PLATELET
BASO%: 1 % (ref 0.0–2.0)
BASOS ABS: 0 10*3/uL (ref 0.0–0.1)
EOS%: 0.6 % (ref 0.0–7.0)
Eosinophils Absolute: 0 10*3/uL (ref 0.0–0.5)
HEMATOCRIT: 37.8 % (ref 34.8–46.6)
HEMOGLOBIN: 12.2 g/dL (ref 11.6–15.9)
LYMPH%: 25.2 % (ref 14.0–49.7)
MCH: 29.7 pg (ref 25.1–34.0)
MCHC: 32.1 g/dL (ref 31.5–36.0)
MCV: 92.4 fL (ref 79.5–101.0)
MONO#: 0.4 10*3/uL (ref 0.1–0.9)
MONO%: 11.8 % (ref 0.0–14.0)
NEUT%: 61.4 % (ref 38.4–76.8)
NEUTROS ABS: 2.2 10*3/uL (ref 1.5–6.5)
PLATELETS: 97 10*3/uL — AB (ref 145–400)
RBC: 4.09 10*6/uL (ref 3.70–5.45)
RDW: 17 % — ABNORMAL HIGH (ref 11.2–14.5)
WBC: 3.6 10*3/uL — ABNORMAL LOW (ref 3.9–10.3)
lymph#: 0.9 10*3/uL (ref 0.9–3.3)

## 2015-03-21 LAB — UA PROTEIN, DIPSTICK - CHCC

## 2015-03-21 MED ORDER — SODIUM CHLORIDE 0.9 % IV SOLN: 15.0000 mg/kg | Freq: Once | INTRAVENOUS | Status: DC

## 2015-03-21 MED ORDER — SODIUM CHLORIDE 0.9 % IV SOLN: Freq: Once | INTRAVENOUS | Status: DC

## 2015-03-21 MED ORDER — SODIUM CHLORIDE 0.9 % IJ SOLN
10.0000 mL | INTRAMUSCULAR | Status: DC | PRN
  Filled 2015-03-21: qty 10

## 2015-03-21 MED ORDER — LISINOPRIL 10 MG PO TABS: 10.0000 mg | ORAL_TABLET | Freq: Every day | ORAL | Status: DC

## 2015-03-21 MED ORDER — SODIUM CHLORIDE 0.9 % IV SOLN: 600.0000 mg/m2 | Freq: Once | INTRAVENOUS | Status: DC

## 2015-03-21 MED ORDER — DEXAMETHASONE SODIUM PHOSPHATE 100 MG/10ML IJ SOLN: Freq: Once | INTRAMUSCULAR | Status: DC

## 2015-03-21 MED ORDER — HEPARIN SOD (PORK) LOCK FLUSH 100 UNIT/ML IV SOLN
500.0000 [IU] | Freq: Once | INTRAVENOUS | Status: DC | PRN
  Filled 2015-03-21: qty 5

## 2015-03-21 NOTE — Progress Notes (Signed)
Patient has increased BPs today.  Treatment cancelled today, per Dr. Jana Hakim, to reschedule for next week. Rx for Lisinopril 10mg  daily called in to pharmacy.

## 2015-03-22 LAB — CA 125: CA 125: 2540 U/mL — ABNORMAL HIGH (ref <35)

## 2015-03-27 ENCOUNTER — Other Ambulatory Visit: Payer: Self-pay | Admitting: *Deleted

## 2015-03-27 DIAGNOSIS — C569 Malignant neoplasm of unspecified ovary: Secondary | ICD-10-CM

## 2015-03-28 ENCOUNTER — Ambulatory Visit (HOSPITAL_BASED_OUTPATIENT_CLINIC_OR_DEPARTMENT_OTHER): Payer: Medicare Other

## 2015-03-28 ENCOUNTER — Other Ambulatory Visit (HOSPITAL_BASED_OUTPATIENT_CLINIC_OR_DEPARTMENT_OTHER): Payer: Medicare Other

## 2015-03-28 ENCOUNTER — Other Ambulatory Visit: Payer: Self-pay | Admitting: Hematology

## 2015-03-28 ENCOUNTER — Telehealth: Payer: Self-pay | Admitting: Hematology

## 2015-03-28 DIAGNOSIS — C569 Malignant neoplasm of unspecified ovary: Secondary | ICD-10-CM

## 2015-03-28 DIAGNOSIS — Z5111 Encounter for antineoplastic chemotherapy: Secondary | ICD-10-CM | POA: Diagnosis not present

## 2015-03-28 DIAGNOSIS — I159 Secondary hypertension, unspecified: Secondary | ICD-10-CM

## 2015-03-28 LAB — CBC WITH DIFFERENTIAL/PLATELET
BASO%: 1 % (ref 0.0–2.0)
Basophils Absolute: 0 10*3/uL (ref 0.0–0.1)
EOS%: 0.8 % (ref 0.0–7.0)
Eosinophils Absolute: 0 10*3/uL (ref 0.0–0.5)
HEMATOCRIT: 37.8 % (ref 34.8–46.6)
HGB: 12 g/dL (ref 11.6–15.9)
LYMPH%: 27.3 % (ref 14.0–49.7)
MCH: 29.8 pg (ref 25.1–34.0)
MCHC: 31.8 g/dL (ref 31.5–36.0)
MCV: 93.7 fL (ref 79.5–101.0)
MONO#: 0.5 10*3/uL (ref 0.1–0.9)
MONO%: 15.5 % — ABNORMAL HIGH (ref 0.0–14.0)
NEUT%: 55.4 % (ref 38.4–76.8)
NEUTROS ABS: 1.8 10*3/uL (ref 1.5–6.5)
PLATELETS: 93 10*3/uL — AB (ref 145–400)
RBC: 4.03 10*6/uL (ref 3.70–5.45)
RDW: 17.4 % — ABNORMAL HIGH (ref 11.2–14.5)
WBC: 3.2 10*3/uL — ABNORMAL LOW (ref 3.9–10.3)
lymph#: 0.9 10*3/uL (ref 0.9–3.3)

## 2015-03-28 LAB — COMPREHENSIVE METABOLIC PANEL (CC13)
ALBUMIN: 3.8 g/dL (ref 3.5–5.0)
ALK PHOS: 82 U/L (ref 40–150)
ALT: 14 U/L (ref 0–55)
ANION GAP: 10 meq/L (ref 3–11)
AST: 21 U/L (ref 5–34)
BUN: 19.7 mg/dL (ref 7.0–26.0)
CALCIUM: 9.4 mg/dL (ref 8.4–10.4)
CHLORIDE: 105 meq/L (ref 98–109)
CO2: 25 meq/L (ref 22–29)
Creatinine: 0.9 mg/dL (ref 0.6–1.1)
EGFR: 65 mL/min/{1.73_m2} — AB (ref >90)
GLUCOSE: 97 mg/dL (ref 70–140)
Potassium: 4.6 mEq/L (ref 3.5–5.1)
SODIUM: 141 meq/L (ref 136–145)
TOTAL PROTEIN: 6.6 g/dL (ref 6.4–8.3)
Total Bilirubin: 0.6 mg/dL (ref 0.20–1.20)

## 2015-03-28 MED ORDER — HYDROCHLOROTHIAZIDE 12.5 MG PO CAPS: 12.5000 mg | ORAL_CAPSULE | Freq: Every day | ORAL | Status: DC

## 2015-03-28 MED ORDER — SODIUM CHLORIDE 0.9 % IV SOLN
600.0000 mg/m2 | Freq: Once | INTRAVENOUS | Status: AC
  Administered 2015-03-28: 920 mg via INTRAVENOUS
  Filled 2015-03-28: qty 46

## 2015-03-28 MED ORDER — HEPARIN SOD (PORK) LOCK FLUSH 100 UNIT/ML IV SOLN
500.0000 [IU] | Freq: Once | INTRAVENOUS | Status: AC | PRN
Start: 2015-03-28 — End: 2015-03-28
  Administered 2015-03-28: 500 [IU]
  Filled 2015-03-28: qty 5

## 2015-03-28 MED ORDER — SODIUM CHLORIDE 0.9 % IJ SOLN
10.0000 mL | INTRAMUSCULAR | Status: DC | PRN
  Administered 2015-03-28: 10 mL
  Filled 2015-03-28: qty 10

## 2015-03-28 MED ORDER — SODIUM CHLORIDE 0.9 % IV SOLN
Freq: Once | INTRAVENOUS | Status: AC
  Administered 2015-03-28: 17:00:00 via INTRAVENOUS
  Filled 2015-03-28: qty 4

## 2015-03-28 MED ORDER — SODIUM CHLORIDE 0.9 % IV SOLN
Freq: Once | INTRAVENOUS | Status: AC
  Administered 2015-03-28: 16:00:00 via INTRAVENOUS

## 2015-03-28 NOTE — Telephone Encounter (Signed)
Dr. Jana Hakim is out office today, and I saw patient in the infusion room today. Due to her persistent hypertension with systolic blood pressure in 190 range, I'll hold her Avastin today. Her platelet count is 93K today, it has been in this range lately, and she tolerated chemo witgh PLT 100k before.  We'll proceed Cytoxan chemotherapy today. I called in hydrochloride 12.5 mg once daily to her pharmacy, and we'll also increase her lisinopril 20 mg Daily.   Return to clinic in 1 week for follow-up lab and preparation.  Truitt Merle  03/28/2015

## 2015-03-28 NOTE — Patient Instructions (Signed)
Cancer Center Discharge Instructions for Patients Receiving Chemotherapy  Today you received the following chemotherapy agents Cytoxan To help prevent nausea and vomiting after your treatment, we encourage you to take your nausea medication as prescribed.   If you develop nausea and vomiting that is not controlled by your nausea medication, call the clinic.   BELOW ARE SYMPTOMS THAT SHOULD BE REPORTED IMMEDIATELY:  *FEVER GREATER THAN 100.5 F  *CHILLS WITH OR WITHOUT FEVER  NAUSEA AND VOMITING THAT IS NOT CONTROLLED WITH YOUR NAUSEA MEDICATION  *UNUSUAL SHORTNESS OF BREATH  *UNUSUAL BRUISING OR BLEEDING  TENDERNESS IN MOUTH AND THROAT WITH OR WITHOUT PRESENCE OF ULCERS  *URINARY PROBLEMS  *BOWEL PROBLEMS  UNUSUAL RASH Items with * indicate a potential emergency and should be followed up as soon as possible.  Feel free to call the clinic you have any questions or concerns. The clinic phone number is [phone].  Please show the CHEMO ALERT CARD at check-in to the Emergency Department and triage nurse.   

## 2015-03-28 NOTE — Progress Notes (Signed)
Cassandra Nichols Notified Dr.Feng, MD on call, of abnormal blood pressures, both manuel and by machine. MD to assess pt in treatment room. Per Dr. Burr Medico, proceed with Cytoxan, MD aware of decreased platelets and elevated b/p.

## 2015-04-01 ENCOUNTER — Telehealth: Payer: Self-pay | Admitting: Hematology

## 2015-04-01 ENCOUNTER — Other Ambulatory Visit: Payer: Self-pay | Admitting: *Deleted

## 2015-04-01 NOTE — Telephone Encounter (Signed)
Left message to confirm appointment for 04/15.

## 2015-04-03 ENCOUNTER — Other Ambulatory Visit: Payer: Self-pay | Admitting: *Deleted

## 2015-04-03 DIAGNOSIS — C569 Malignant neoplasm of unspecified ovary: Secondary | ICD-10-CM

## 2015-04-04 ENCOUNTER — Encounter: Payer: Self-pay | Admitting: Nurse Practitioner

## 2015-04-04 ENCOUNTER — Ambulatory Visit (HOSPITAL_BASED_OUTPATIENT_CLINIC_OR_DEPARTMENT_OTHER): Payer: Medicare Other | Admitting: Nurse Practitioner

## 2015-04-04 ENCOUNTER — Other Ambulatory Visit (HOSPITAL_BASED_OUTPATIENT_CLINIC_OR_DEPARTMENT_OTHER): Payer: Medicare Other

## 2015-04-04 VITALS — BP 152/67 | HR 84 | Temp 97.5°F | Resp 18 | Ht 59.0 in | Wt 120.4 lb

## 2015-04-04 DIAGNOSIS — I10 Essential (primary) hypertension: Secondary | ICD-10-CM

## 2015-04-04 DIAGNOSIS — C569 Malignant neoplasm of unspecified ovary: Secondary | ICD-10-CM | POA: Diagnosis not present

## 2015-04-04 DIAGNOSIS — T50905A Adverse effect of unspecified drugs, medicaments and biological substances, initial encounter: Secondary | ICD-10-CM

## 2015-04-04 DIAGNOSIS — I158 Other secondary hypertension: Secondary | ICD-10-CM

## 2015-04-04 HISTORY — DX: Adverse effect of unspecified drugs, medicaments and biological substances, initial encounter: T50.905A

## 2015-04-04 HISTORY — DX: Adverse effect of unspecified drugs, medicaments and biological substances, initial encounter: I15.8

## 2015-04-04 LAB — CBC WITH DIFFERENTIAL/PLATELET
BASO%: 0.7 % (ref 0.0–2.0)
BASOS ABS: 0 10*3/uL (ref 0.0–0.1)
EOS%: 1.5 % (ref 0.0–7.0)
Eosinophils Absolute: 0 10*3/uL (ref 0.0–0.5)
HCT: 36.5 % (ref 34.8–46.6)
HEMOGLOBIN: 12.1 g/dL (ref 11.6–15.9)
LYMPH#: 0.7 10*3/uL — AB (ref 0.9–3.3)
LYMPH%: 26.9 % (ref 14.0–49.7)
MCH: 31.4 pg (ref 25.1–34.0)
MCHC: 33.2 g/dL (ref 31.5–36.0)
MCV: 94.8 fL (ref 79.5–101.0)
MONO#: 0.4 10*3/uL (ref 0.1–0.9)
MONO%: 12.9 % (ref 0.0–14.0)
NEUT#: 1.6 10*3/uL (ref 1.5–6.5)
NEUT%: 58 % (ref 38.4–76.8)
PLATELETS: 107 10*3/uL — AB (ref 145–400)
RBC: 3.85 10*6/uL (ref 3.70–5.45)
RDW: 16.3 % — ABNORMAL HIGH (ref 11.2–14.5)
WBC: 2.7 10*3/uL — ABNORMAL LOW (ref 3.9–10.3)

## 2015-04-04 LAB — COMPREHENSIVE METABOLIC PANEL (CC13)
ALT: 13 U/L (ref 0–55)
AST: 18 U/L (ref 5–34)
Albumin: 3.8 g/dL (ref 3.5–5.0)
Alkaline Phosphatase: 76 U/L (ref 40–150)
Anion Gap: 11 mEq/L (ref 3–11)
BUN: 21.9 mg/dL (ref 7.0–26.0)
CALCIUM: 7.5 mg/dL — AB (ref 8.4–10.4)
CO2: 24 mEq/L (ref 22–29)
CREATININE: 0.8 mg/dL (ref 0.6–1.1)
Chloride: 107 mEq/L (ref 98–109)
EGFR: 69 mL/min/{1.73_m2} — ABNORMAL LOW (ref >90)
Glucose: 119 mg/dl (ref 70–140)
POTASSIUM: 4.3 meq/L (ref 3.5–5.1)
Sodium: 142 mEq/L (ref 136–145)
Total Bilirubin: 0.56 mg/dL (ref 0.20–1.20)
Total Protein: 6.5 g/dL (ref 6.4–8.3)

## 2015-04-04 LAB — UA PROTEIN, DIPSTICK - CHCC: Protein, ur: NEGATIVE mg/dL

## 2015-04-04 NOTE — Progress Notes (Signed)
ID: [name]   DOB: 12-11-1941  MR#: [id number]  HGD#:[id number]   PCP:  Cassandra Smolder, MD GYN:  Cassandra Laroche, MD OTHER:  Cassandra Peach, MD  CHIEF COMPLAINT:  Recurrent Ovarian Cancer  CURRENT TREATMENT:  cyclophosphamide and carboplatin  NOTE: THIS PATIENT REQUESTS THAT ABSOLUTELY NO NUMERIC INFORMATION BE GIVEN REGARDING ANY LAB AND PARTICULARLY DOES NOT WANT TO KNOW SPECIFIC CA-125 RESULTS  HISTORY OF PRESENT ILLNESS: Cassandra Nichols was initially diagnosed with ovarian cancer in July of 2002. She underwent debulking surgery at that time for what proved to be a 4 of 18 lymph nodes positive disease. She was initially treated with 6 cycles of paclitaxel and carboplatin adjuvantly, completed in December of 2002.  Patient has had multiple recurrences since December of 2002, and has been treated with multiple agents as noted below.  INTERVAL HISTORY: Cassandra Nichols returns today for follow up of her ovarian cancer. With cycle 5 of chemotherapy, the avastin was held because of elevated blood pressure. Her lisinopril was increased to 20mg  and she was started on HCTZ 12mg  daily.    REVIEW OF SYSTEMS: Cassandra Nichols denies fevers, chills, or changes in bowel or bladder habit. She has some mild queeziness after treatment, but this is managed well by her PRN antiemetics. Her appetite is healthy and she is drinking well. Her energy level is good during the day, and she sleeps well at night. She denies shortness of breath, chest pain, cough, or palpitations. She has no headaches, dizziness, unexplained weight loss, or night sweats. A detailed review of systems is otherwise stable.  PAST MEDICAL HISTORY: Past Medical History  Diagnosis Date  . Shingles   . Ovarian cancer 11/17/2011  . Cancer     PAST SURGICAL HISTORY: Past Surgical History  Procedure Laterality Date  . Ganglion cyst excision Left     Wrist    FAMILY HISTORY Family History  Problem Relation Age of Onset  . Hypertension Mother   . GER  disease Mother   . Cancer Father     Cancer (stomach)    GYNECOLOGIC HISTORY: GXP2  SOCIAL HISTORY: (Updated 03/08/2014) Cassandra Nichols lives alone. She works temporary jobs, sometimes as a Oceanographer, and sometimes grading tests.  As of March 2015, she is working full-time hours at the [employer] center. She's primary caregiver for her and who is currently 38 and lives in [place].  Her daughter Cassandra Nichols lives in [place] and is Mudlogger of the epidemiology and [place] there; she is married with 2 children. Son  Cassandra Nichols lives in [place], [place]. He has 4 children of his own.His wife is a Marine scientist  ADVANCED DIRECTIVES: in place, but the patient has not been able to find the documents. At the 08/15/2014 visit she was given the appropriate living will and health Department to you documents to complete and notarize at her discretion.  HEALTH MAINTENANCE:  (Updated  03/08/2014) History  Substance Use Topics  . Smoking status: Former Research scientist (life sciences)  . Smokeless tobacco: Never Used  . Alcohol Use: No     Comment: Occasionally     Colonoscopy: Not on file  PAP:   Bone density: Not on file  Lipid panel: Not on file  Mammogram:  Feb 2103, [facility]   Allergies  Allergen Reactions  . Penicillins Rash    Current Outpatient Prescriptions  Medication Sig Dispense Refill  . dexamethasone (DECADRON) 4 MG tablet Take 4 mg by mouth as needed.    . hydrochlorothiazide (MICROZIDE) 12.5 MG capsule Take  1 capsule (12.5 mg total) by mouth daily. 30 capsule 0  . lisinopril (PRINIVIL) 10 MG tablet Take 1 tablet (10 mg total) by mouth daily. (Patient taking differently: Take 10 mg by mouth daily. Pt takes 2 tablets daily(20 mg total).) 30 tablet 4  . loratadine (CLARITIN) 10 MG tablet Take 10 mg by mouth daily as needed for allergies.    . Melatonin 3 MG TABS Take 3 mg by mouth at bedtime as needed.    . Alum & Mag Hydroxide-Simeth (MAGIC MOUTHWASH W/LIDOCAINE) SOLN Take 5 mLs by mouth 4 (four) times daily as  needed. (Patient not taking: Reported on 02/21/2015) 240 mL 2  . [DISCONTINUED] prochlorperazine (COMPAZINE) 10 MG tablet Take 1 tablet (10 mg total) by mouth every 6 (six) hours as needed (Nausea or vomiting). 30 tablet 1   No current facility-administered medications for this visit.   Facility-Administered Medications Ordered in Other Visits  Medication Dose Route Frequency Provider Last Rate Last Dose  . sodium chloride 0.9 % injection 10 mL  10 mL Intravenous PRN Chauncey Cruel, MD   10 mL at 05/24/14 1601    OBJECTIVE: Middle-aged white woman in no acute distress Filed Vitals:   04/04/15 1518  BP: 152/67  Pulse: 84  Temp: 97.5 F (36.4 C)  Resp: 18     Body mass index is 24.3 kg/(m^2).    ECOG FS: 0  Current weight 120 pounds, usual range 119 to 128  Sclerae unicteric, pupils equal and reactive Oropharynx clear and moist-- no thrush No cervical or supraclavicular adenopathy Lungs no rales or rhonchi Heart regular rate and rhythm Abd soft, nontender, positive bowel sounds MSK no focal spinal tenderness, no upper extremity lymphedema Neuro: nonfocal, well oriented, appropriate affect Breasts: deferred  LAB RESULTS:  Lab Results  Component Value Date   WBC 2.7* 04/04/2015   NEUTROABS 1.6 04/04/2015   HGB 12.1 04/04/2015   HCT 36.5 04/04/2015   MCV 94.8 04/04/2015   PLT 107* 04/04/2015        Chemistry      Component Value Date/Time   NA 142 04/04/2015 1503   NA 140 07/04/2012 0832   K 4.3 04/04/2015 1503   K 4.2 07/04/2012 0832   CL 106 05/09/2013 1304   CL 106 07/04/2012 0832   CO2 24 04/04/2015 1503   CO2 26 07/04/2012 0832   BUN 21.9 04/04/2015 1503   BUN 17 07/04/2012 0832   CREATININE 0.8 04/04/2015 1503   CREATININE 0.82 07/04/2012 0832      Component Value Date/Time   CALCIUM 7.5* 04/04/2015 1503   CALCIUM 9.2 07/04/2012 0832   ALKPHOS 76 04/04/2015 1503   ALKPHOS 71 07/04/2012 0832   AST 18 04/04/2015 1503   AST 18 07/04/2012 0832   ALT  13 04/04/2015 1503   ALT 12 07/04/2012 0832   BILITOT 0.56 04/04/2015 1503   BILITOT 0.7 07/04/2012 0832     Results for Cassandra Nichols, Cassandra Nichols (MRN [id number]) as of 04/04/2015 16:07  Ref. Range 08/28/2014 11:01 10/10/2014 13:45 10/10/2014 13:45 11/21/2014 15:48 01/14/2015 15:12 02/05/2015 13:17 02/21/2015 13:09 03/21/2015 14:41  CA 125 Latest Ref Range: <35 U/mL 1629.8 (H) 2835 (H) 1787.4 (H) 2330 (H) 2733 (H) 2614 (H) 2502 (H) 2540 (H)    STUDIES: No results found.  ASSESSMENT: 74 y.o.  Cassandra Nichols woman (1)  with a history of ovarian cancer dating back to July 2002 when she underwent debulking surgery for what proved to be 4/18 lymph node positive disease.   (  2)  She received 6 cycles of paclitaxel/carboplatin adjuvantly completed in December 2002.   (3)  She had her first recurrence in December 2005 treated with paclitaxel;   (4)  second recurrence May 2007 treated with [place] and paclitaxel;   (5)  third recurrence January 2010 treated with topotecan and Gemzar;   (6)  fourth recurrence January 2012 treated with single-agent carboplatin (January to August 2012) with initial response, then plateau; with progression January 2013 (less than 6 months after last carbo dose) treated with single-agent gemzar given Q2 weeks (January to April 2013), with evidence of progression  (7) Doxil strartedJuly 2013 6, with evidence of initial response, and fair tolerance. Held between May and August 2014 due to patient's work schedule and scheduling issues.  Resumed treatment as of 08/24/2013, with some delays secondary to the patient's schedule. As of early December 2014, her total cumulative dose of Doxil reached 560 mg per meter square. In addition there was also an upward trend of the CA 125. Doxil was discontinued (last dose 10/30/2013). Echocardiogram on 11/14/2013 showed a well preserved ejection fraction of 55-60%.  (8)  treated with single agent paclitaxel, given on days one and 8 of each 21 day cycle, first  dose on 12/27/2013,with continuing response, but mild to moderate toxicity; 11th and last dose 04/12/2014   (9) carboplatin and cyclophosphamide started on 08/28/14 given every 21 days-- stopped 10/10/2014 after a reaction to carboplatin (also no evidence of response)  (10) cyclophosphamide and bevacizumab to start 12/02/2014, to be repeated every 21 days  PLAN:  Cassandra Nichols is doing well today. Her blood pressure is improved to 152/67 today, so she will continue the increased lisinopril and HCTZ addition. She is not interested in obtaining a blood pressure machine to recheck more often as home.   Cassandra Nichols will continue with cyclophosphamide and carboplatin every 3 weeks. Her next office visit will be in June. She understands and agrees with this plan. She has been encouraged to call with any issues that might arise before her next visit here.   Laurie Panda, NP     04/04/2015

## 2015-04-04 NOTE — Addendum Note (Signed)
Addended by: [employer] D on: 04/04/2015 06:08 PM   Modules accepted: Medications

## 2015-04-05 LAB — CA 125: CA 125: 2808 U/mL — ABNORMAL HIGH (ref <35)

## 2015-04-08 ENCOUNTER — Telehealth: Payer: Self-pay | Admitting: Nurse Practitioner

## 2015-04-08 ENCOUNTER — Other Ambulatory Visit: Payer: Self-pay | Admitting: *Deleted

## 2015-04-08 NOTE — Telephone Encounter (Signed)
Called and left a message with pof and verbal per val to move 4/22 to 5/6,5/13 to 5/27 and keep 6/3 lab and visit and will add move tx at this time

## 2015-04-10 ENCOUNTER — Other Ambulatory Visit: Payer: Self-pay | Admitting: Oncology

## 2015-04-11 ENCOUNTER — Other Ambulatory Visit: Payer: Medicare Other

## 2015-04-11 ENCOUNTER — Ambulatory Visit: Payer: Medicare Other

## 2015-04-14 ENCOUNTER — Other Ambulatory Visit: Payer: Self-pay | Admitting: *Deleted

## 2015-04-14 MED ORDER — LISINOPRIL 20 MG PO TABS: 20.0000 mg | ORAL_TABLET | Freq: Every day | ORAL | Status: DC

## 2015-04-14 NOTE — Telephone Encounter (Signed)
Message from pt requesting Lisinopril 20 mg to be called in to pharmacy. Dose was increased and she is out of the 10 mg tab. Rx sent electronically.

## 2015-04-25 ENCOUNTER — Other Ambulatory Visit (HOSPITAL_BASED_OUTPATIENT_CLINIC_OR_DEPARTMENT_OTHER): Payer: Medicare Other

## 2015-04-25 ENCOUNTER — Other Ambulatory Visit: Payer: Self-pay | Admitting: Oncology

## 2015-04-25 ENCOUNTER — Ambulatory Visit (HOSPITAL_BASED_OUTPATIENT_CLINIC_OR_DEPARTMENT_OTHER): Payer: Medicare Other

## 2015-04-25 VITALS — BP 136/80 | HR 75 | Temp 98.4°F | Resp 18

## 2015-04-25 DIAGNOSIS — Z5111 Encounter for antineoplastic chemotherapy: Secondary | ICD-10-CM

## 2015-04-25 DIAGNOSIS — Z5112 Encounter for antineoplastic immunotherapy: Secondary | ICD-10-CM | POA: Diagnosis not present

## 2015-04-25 DIAGNOSIS — C569 Malignant neoplasm of unspecified ovary: Secondary | ICD-10-CM

## 2015-04-25 LAB — CBC WITH DIFFERENTIAL/PLATELET
BASO%: 1.4 % (ref 0.0–2.0)
Basophils Absolute: 0.1 10*3/uL (ref 0.0–0.1)
EOS%: 0.8 % (ref 0.0–7.0)
Eosinophils Absolute: 0 10*3/uL (ref 0.0–0.5)
HCT: 38.7 % (ref 34.8–46.6)
HEMOGLOBIN: 12.6 g/dL (ref 11.6–15.9)
LYMPH#: 1.2 10*3/uL (ref 0.9–3.3)
LYMPH%: 25.8 % (ref 14.0–49.7)
MCH: 31.2 pg (ref 25.1–34.0)
MCHC: 32.6 g/dL (ref 31.5–36.0)
MCV: 95.7 fL (ref 79.5–101.0)
MONO#: 0.6 10*3/uL (ref 0.1–0.9)
MONO%: 13.8 % (ref 0.0–14.0)
NEUT%: 58.2 % (ref 38.4–76.8)
NEUTROS ABS: 2.7 10*3/uL (ref 1.5–6.5)
Platelets: 137 10*3/uL — ABNORMAL LOW (ref 145–400)
RBC: 4.04 10*6/uL (ref 3.70–5.45)
RDW: 16.8 % — ABNORMAL HIGH (ref 11.2–14.5)
WBC: 4.6 10*3/uL (ref 3.9–10.3)

## 2015-04-25 LAB — COMPREHENSIVE METABOLIC PANEL (CC13)
ALK PHOS: 89 U/L (ref 40–150)
ALT: 13 U/L (ref 0–55)
AST: 18 U/L (ref 5–34)
Albumin: 3.7 g/dL (ref 3.5–5.0)
Anion Gap: 14 mEq/L — ABNORMAL HIGH (ref 3–11)
BUN: 23 mg/dL (ref 7.0–26.0)
CALCIUM: 9.3 mg/dL (ref 8.4–10.4)
CO2: 22 meq/L (ref 22–29)
CREATININE: 0.9 mg/dL (ref 0.6–1.1)
Chloride: 105 mEq/L (ref 98–109)
EGFR: 64 mL/min/{1.73_m2} — ABNORMAL LOW (ref >90)
Glucose: 100 mg/dl (ref 70–140)
Potassium: 4.4 mEq/L (ref 3.5–5.1)
Sodium: 141 mEq/L (ref 136–145)
Total Bilirubin: 0.35 mg/dL (ref 0.20–1.20)
Total Protein: 6.8 g/dL (ref 6.4–8.3)

## 2015-04-25 LAB — UA PROTEIN, DIPSTICK - CHCC: Protein, ur: NEGATIVE mg/dL

## 2015-04-25 MED ORDER — SODIUM CHLORIDE 0.9 % IV SOLN
Freq: Once | INTRAVENOUS | Status: AC
  Administered 2015-04-25: 16:00:00 via INTRAVENOUS

## 2015-04-25 MED ORDER — SODIUM CHLORIDE 0.9 % IV SOLN
14.3000 mg/kg | Freq: Once | INTRAVENOUS | Status: AC
  Administered 2015-04-25: 800 mg via INTRAVENOUS
  Filled 2015-04-25: qty 32

## 2015-04-25 MED ORDER — HEPARIN SOD (PORK) LOCK FLUSH 100 UNIT/ML IV SOLN
500.0000 [IU] | Freq: Once | INTRAVENOUS | Status: AC | PRN
  Administered 2015-04-25: 500 [IU]
  Filled 2015-04-25: qty 5

## 2015-04-25 MED ORDER — SODIUM CHLORIDE 0.9 % IV SOLN
600.0000 mg/m2 | Freq: Once | INTRAVENOUS | Status: AC
  Administered 2015-04-25: 920 mg via INTRAVENOUS
  Filled 2015-04-25: qty 46

## 2015-04-25 MED ORDER — SODIUM CHLORIDE 0.9 % IJ SOLN
10.0000 mL | INTRAMUSCULAR | Status: DC | PRN
  Administered 2015-04-25: 10 mL
  Filled 2015-04-25: qty 10

## 2015-04-25 MED ORDER — SODIUM CHLORIDE 0.9 % IV SOLN
Freq: Once | INTRAVENOUS | Status: AC
  Administered 2015-04-25: 16:00:00 via INTRAVENOUS
  Filled 2015-04-25: qty 4

## 2015-04-25 NOTE — Patient Instructions (Addendum)
[facility] Discharge Instructions for Patients Receiving Chemotherapy  Today you received the following chemotherapy agents :  Avastin,  Cytoxan.  To help prevent nausea and vomiting after your treatment, we encourage you to take your nausea medication as prescribed.   If you develop nausea and vomiting that is not controlled by your nausea medication, call the clinic.   BELOW ARE SYMPTOMS THAT SHOULD BE REPORTED IMMEDIATELY:  *FEVER GREATER THAN 100.5 F  *CHILLS WITH OR WITHOUT FEVER  NAUSEA AND VOMITING THAT IS NOT CONTROLLED WITH YOUR NAUSEA MEDICATION  *UNUSUAL SHORTNESS OF BREATH  *UNUSUAL BRUISING OR BLEEDING  TENDERNESS IN MOUTH AND THROAT WITH OR WITHOUT PRESENCE OF ULCERS  *URINARY PROBLEMS  *BOWEL PROBLEMS  UNUSUAL RASH Items with * indicate a potential emergency and should be followed up as soon as possible.  Feel free to call the clinic you have any questions or concerns. The clinic phone number is [phone].  Please show the [facility] at check-in to the Emergency Department and triage nurse.  Cyclophosphamide injection What is this medicine? CYCLOPHOSPHAMIDE (sye kloe FOSS fa mide) is a chemotherapy drug. It slows the growth of cancer cells. This medicine is used to treat many types of cancer like lymphoma, myeloma, leukemia, breast cancer, and ovarian cancer, to name a few. This medicine may be used for other purposes; ask your health care provider or pharmacist if you have questions. COMMON BRAND NAME(S): Cytoxan, Neosar What should I tell my health care provider before I take this medicine? They need to know if you have any of these conditions: -blood disorders -history of other chemotherapy -infection -kidney disease -liver disease -recent or ongoing radiation therapy -tumors in the bone marrow -an unusual or allergic reaction to cyclophosphamide, other chemotherapy, other medicines, foods, dyes, or preservatives -pregnant  or trying to get pregnant -breast-feeding How should I use this medicine? This drug is usually given as an injection into a vein or muscle or by infusion into a vein. It is administered in a hospital or clinic by a specially trained health care professional. Talk to your pediatrician regarding the use of this medicine in children. Special care may be needed. Overdosage: If you think you have taken too much of this medicine contact a poison control center or emergency room at once. NOTE: This medicine is only for you. Do not share this medicine with others. What if I miss a dose? It is important not to miss your dose. Call your doctor or health care professional if you are unable to keep an appointment. What may interact with this medicine? This medicine may interact with the following medications: -amiodarone -amphotericin B -azathioprine -certain antiviral medicines for HIV or AIDS such as protease inhibitors (e.g., indinavir, ritonavir) and zidovudine -certain blood pressure medications such as benazepril, captopril, enalapril, fosinopril, lisinopril, moexipril, monopril, perindopril, quinapril, ramipril, trandolapril -certain cancer medications such as anthracyclines (e.g., daunorubicin, doxorubicin), busulfan, cytarabine, paclitaxel, pentostatin, tamoxifen, trastuzumab -certain diuretics such as chlorothiazide, chlorthalidone, hydrochlorothiazide, indapamide, metolazone -certain medicines that treat or prevent blood clots like warfarin -certain muscle relaxants such as succinylcholine -cyclosporine -etanercept -indomethacin -medicines to increase blood counts like filgrastim, pegfilgrastim, sargramostim -medicines used as general anesthesia -metronidazole -natalizumab This list may not describe all possible interactions. Give your health care provider a list of all the medicines, herbs, non-prescription drugs, or dietary supplements you use. Also tell them if you smoke, drink alcohol,  or use illegal drugs. Some items may interact with your medicine. What should I  watch for while using this medicine? Visit your doctor for checks on your progress. This drug may make you feel generally unwell. This is not uncommon, as chemotherapy can affect healthy cells as well as cancer cells. Report any side effects. Continue your course of treatment even though you feel ill unless your doctor tells you to stop. Drink water or other fluids as directed. Urinate often, even at night. In some cases, you may be given additional medicines to help with side effects. Follow all directions for their use. Call your doctor or health care professional for advice if you get a fever, chills or sore throat, or other symptoms of a cold or flu. Do not treat yourself. This drug decreases your body's ability to fight infections. Try to avoid being around people who are sick. This medicine may increase your risk to bruise or bleed. Call your doctor or health care professional if you notice any unusual bleeding. Be careful brushing and flossing your teeth or using a toothpick because you may get an infection or bleed more easily. If you have any dental work done, tell your dentist you are receiving this medicine. You may get drowsy or dizzy. Do not drive, use machinery, or do anything that needs mental alertness until you know how this medicine affects you. Do not become pregnant while taking this medicine or for 1 year after stopping it. Women should inform their doctor if they wish to become pregnant or think they might be pregnant. Men should not father a child while taking this medicine and for 4 months after stopping it. There is a potential for serious side effects to an unborn child. Talk to your health care professional or pharmacist for more information. Do not breast-feed an infant while taking this medicine. This medicine may interfere with the ability to have a child. This medicine has caused ovarian failure  in some women. This medicine has caused reduced sperm counts in some men. You should talk with your doctor or health care professional if you are concerned about your fertility. If you are going to have surgery, tell your doctor or health care professional that you have taken this medicine. What side effects may I notice from receiving this medicine? Side effects that you should report to your doctor or health care professional as soon as possible: -allergic reactions like skin rash, itching or hives, swelling of the face, lips, or tongue -low blood counts - this medicine may decrease the number of white blood cells, red blood cells and platelets. You may be at increased risk for infections and bleeding. -signs of infection - fever or chills, cough, sore throat, pain or difficulty passing urine -signs of decreased platelets or bleeding - bruising, pinpoint red spots on the skin, black, tarry stools, blood in the urine -signs of decreased red blood cells - unusually weak or tired, fainting spells, lightheadedness -breathing problems -dark urine -dizziness -palpitations -swelling of the ankles, feet, hands -trouble passing urine or change in the amount of urine -weight gain -yellowing of the eyes or skin Side effects that usually do not require medical attention (report to your doctor or health care professional if they continue or are bothersome): -changes in nail or skin color -hair loss -missed menstrual periods -mouth sores -nausea, vomiting This list may not describe all possible side effects. Call your doctor for medical advice about side effects. You may report side effects to FDA at 1-800-FDA-1088. Where should I keep my medicine? This drug is given in a  hospital or clinic and will not be stored at home. NOTE: This sheet is a summary. It may not cover all possible information. If you have questions about this medicine, talk to your doctor, pharmacist, or health care provider.  2015,  [employer] Standard. (2012-10-20 16:22:58) Bevacizumab injection What is this medicine? BEVACIZUMAB (be va SIZ yoo mab) is a chemotherapy drug. It targets a protein found in many cancer cell types, and halts cancer growth. This drug treats many cancers including non-small cell lung cancer, ovarian cancer, cervical cancer, and colon or rectal cancer. It is usually given with other chemotherapy drugs. This medicine may be used for other purposes; ask your health care provider or pharmacist if you have questions. COMMON BRAND NAME(S): Avastin What should I tell my health care provider before I take this medicine? They need to know if you have any of these conditions: -blood clots -heart disease, including heart failure, heart attack, or chest pain (angina) -high blood pressure -infection (especially a virus infection such as chickenpox, cold sores, or herpes) -kidney disease -lung disease -prior chemotherapy with doxorubicin, daunorubicin, epirubicin, or other anthracycline type chemotherapy agents -recent or ongoing radiation therapy -recent surgery -stroke -an unusual or allergic reaction to bevacizumab, hamster proteins, mouse proteins, other medicines, foods, dyes, or preservatives -pregnant or trying to get pregnant -breast-feeding How should I use this medicine? This medicine is for infusion into a vein. It is given by a health care professional in a hospital or clinic setting. Talk to your pediatrician regarding the use of this medicine in children. Special care may be needed. Overdosage: If you think you have taken too much of this medicine contact a poison control center or emergency room at once. NOTE: This medicine is only for you. Do not share this medicine with others. What if I miss a dose? It is important not to miss your dose. Call your doctor or health care professional if you are unable to keep an appointment. What may interact with this medicine? Interactions are not  expected. This list may not describe all possible interactions. Give your health care provider a list of all the medicines, herbs, non-prescription drugs, or dietary supplements you use. Also tell them if you smoke, drink alcohol, or use illegal drugs. Some items may interact with your medicine. What should I watch for while using this medicine? Your condition will be monitored carefully while you are receiving this medicine. You will need important blood work and urine testing done while you are taking this medicine. During your treatment, let your health care professional know if you have any unusual symptoms, such as difficulty breathing. This medicine may rarely cause 'gastrointestinal perforation' (holes in the stomach, intestines or colon), a serious side effect requiring surgery to repair. This medicine should be started at least 28 days following major surgery and the site of the surgery should be totally healed. Check with your doctor before scheduling dental work or surgery while you are receiving this treatment. Talk to your doctor if you have recently had surgery or if you have a wound that has not healed. Do not become pregnant while taking this medicine. Women should inform their doctor if they wish to become pregnant or think they might be pregnant. There is a potential for serious side effects to an unborn child. Talk to your health care professional or pharmacist for more information. Do not breast-feed an infant while taking this medicine. This medicine has caused ovarian failure in some women. This medicine may interfere with  the ability to have a child. You should talk to your doctor or health care professional if you are concerned about your fertility. What side effects may I notice from receiving this medicine? Side effects that you should report to your doctor or health care professional as soon as possible: -allergic reactions like skin rash, itching or hives, swelling of the face,  lips, or tongue -signs of infection - fever or chills, cough, sore throat, pain or trouble passing urine -signs of decreased platelets or bleeding - bruising, pinpoint red spots on the skin, black, tarry stools, nosebleeds, blood in the urine -breathing problems -changes in vision -chest pain -confusion -jaw pain, especially after dental work -mouth sores -seizures -severe abdominal pain -severe headache -sudden numbness or weakness of the face, arm or leg -swelling of legs or ankles -symptoms of a stroke: change in mental awareness, inability to talk or move one side of the body (especially in patients with lung cancer) -trouble passing urine or change in the amount of urine -trouble speaking or understanding -trouble walking, dizziness, loss of balance or coordination Side effects that usually do not require medical attention (report to your doctor or health care professional if they continue or are bothersome): -constipation -diarrhea -dry skin -headache -loss of appetite -nausea, vomiting This list may not describe all possible side effects. Call your doctor for medical advice about side effects. You may report side effects to FDA at 1-800-FDA-1088. Where should I keep my medicine? This drug is given in a hospital or clinic and will not be stored at home. NOTE: This sheet is a summary. It may not cover all possible information. If you have questions about this medicine, talk to your doctor, pharmacist, or health care provider.  2015, [employer] Standard. (2013-11-06 11:38:34)

## 2015-04-27 ENCOUNTER — Other Ambulatory Visit: Payer: Self-pay | Admitting: Oncology

## 2015-05-02 ENCOUNTER — Other Ambulatory Visit: Payer: Medicare Other

## 2015-05-02 ENCOUNTER — Ambulatory Visit: Payer: Medicare Other

## 2015-05-16 ENCOUNTER — Other Ambulatory Visit (HOSPITAL_BASED_OUTPATIENT_CLINIC_OR_DEPARTMENT_OTHER): Payer: Medicare Other

## 2015-05-16 ENCOUNTER — Other Ambulatory Visit: Payer: Self-pay | Admitting: Oncology

## 2015-05-16 ENCOUNTER — Ambulatory Visit (HOSPITAL_BASED_OUTPATIENT_CLINIC_OR_DEPARTMENT_OTHER): Payer: Medicare Other

## 2015-05-16 VITALS — BP 167/64 | HR 67 | Temp 98.2°F | Resp 16

## 2015-05-16 DIAGNOSIS — C569 Malignant neoplasm of unspecified ovary: Secondary | ICD-10-CM

## 2015-05-16 DIAGNOSIS — Z5111 Encounter for antineoplastic chemotherapy: Secondary | ICD-10-CM

## 2015-05-16 DIAGNOSIS — Z5112 Encounter for antineoplastic immunotherapy: Secondary | ICD-10-CM | POA: Diagnosis not present

## 2015-05-16 LAB — CBC WITH DIFFERENTIAL/PLATELET
BASO%: 1 % (ref 0.0–2.0)
BASOS ABS: 0 10*3/uL (ref 0.0–0.1)
EOS%: 1 % (ref 0.0–7.0)
Eosinophils Absolute: 0 10*3/uL (ref 0.0–0.5)
HEMATOCRIT: 36.1 % (ref 34.8–46.6)
HGB: 12 g/dL (ref 11.6–15.9)
LYMPH#: 0.9 10*3/uL (ref 0.9–3.3)
LYMPH%: 30 % (ref 14.0–49.7)
MCH: 31.9 pg (ref 25.1–34.0)
MCHC: 33.2 g/dL (ref 31.5–36.0)
MCV: 96 fL (ref 79.5–101.0)
MONO#: 0.5 10*3/uL (ref 0.1–0.9)
MONO%: 17.1 % — ABNORMAL HIGH (ref 0.0–14.0)
NEUT#: 1.6 10*3/uL (ref 1.5–6.5)
NEUT%: 50.9 % (ref 38.4–76.8)
Platelets: 96 10*3/uL — ABNORMAL LOW (ref 145–400)
RBC: 3.76 10*6/uL (ref 3.70–5.45)
RDW: 14.9 % — ABNORMAL HIGH (ref 11.2–14.5)
WBC: 3.1 10*3/uL — ABNORMAL LOW (ref 3.9–10.3)

## 2015-05-16 LAB — COMPREHENSIVE METABOLIC PANEL (CC13)
ALT: 14 U/L (ref 0–55)
AST: 22 U/L (ref 5–34)
Albumin: 3.5 g/dL (ref 3.5–5.0)
Alkaline Phosphatase: 87 U/L (ref 40–150)
Anion Gap: 12 mEq/L — ABNORMAL HIGH (ref 3–11)
BUN: 18.2 mg/dL (ref 7.0–26.0)
CHLORIDE: 105 meq/L (ref 98–109)
CO2: 26 mEq/L (ref 22–29)
Calcium: 9 mg/dL (ref 8.4–10.4)
Creatinine: 0.8 mg/dL (ref 0.6–1.1)
EGFR: 69 mL/min/{1.73_m2} — ABNORMAL LOW (ref >90)
Glucose: 93 mg/dl (ref 70–140)
Potassium: 4.4 mEq/L (ref 3.5–5.1)
Sodium: 143 mEq/L (ref 136–145)
Total Bilirubin: 0.43 mg/dL (ref 0.20–1.20)
Total Protein: 6.5 g/dL (ref 6.4–8.3)

## 2015-05-16 LAB — UA PROTEIN, DIPSTICK - CHCC: Protein, ur: NEGATIVE mg/dL

## 2015-05-16 MED ORDER — SODIUM CHLORIDE 0.9 % IJ SOLN
10.0000 mL | INTRAMUSCULAR | Status: DC | PRN
  Filled 2015-05-16: qty 10

## 2015-05-16 MED ORDER — SODIUM CHLORIDE 0.9 % IV SOLN: 15.0000 mg/kg | Freq: Once | INTRAVENOUS | Status: DC

## 2015-05-16 MED ORDER — SODIUM CHLORIDE 0.9 % IV SOLN
600.0000 mg/m2 | Freq: Once | INTRAVENOUS | Status: AC
  Administered 2015-05-16: 920 mg via INTRAVENOUS
  Filled 2015-05-16: qty 46

## 2015-05-16 MED ORDER — SODIUM CHLORIDE 0.9 % IV SOLN
14.5000 mg/kg | Freq: Once | INTRAVENOUS | Status: AC
  Administered 2015-05-16: 800 mg via INTRAVENOUS
  Filled 2015-05-16: qty 32

## 2015-05-16 MED ORDER — SODIUM CHLORIDE 0.9 % IV SOLN
Freq: Once | INTRAVENOUS | Status: AC
  Administered 2015-05-16: 16:00:00 via INTRAVENOUS

## 2015-05-16 MED ORDER — SODIUM CHLORIDE 0.9 % IV SOLN
Freq: Once | INTRAVENOUS | Status: AC
  Administered 2015-05-16: 16:00:00 via INTRAVENOUS
  Filled 2015-05-16: qty 4

## 2015-05-16 MED ORDER — HEPARIN SOD (PORK) LOCK FLUSH 100 UNIT/ML IV SOLN
500.0000 [IU] | Freq: Once | INTRAVENOUS | Status: DC | PRN
  Filled 2015-05-16: qty 5

## 2015-05-16 NOTE — Patient Instructions (Signed)
[facility] Discharge Instructions for Patients Receiving Chemotherapy  Today you received the following chemotherapy agents :  Avastin,  Cytoxan.  To help prevent nausea and vomiting after your treatment, we encourage you to take your nausea medication as prescribed.   If you develop nausea and vomiting that is not controlled by your nausea medication, call the clinic.   BELOW ARE SYMPTOMS THAT SHOULD BE REPORTED IMMEDIATELY:  *FEVER GREATER THAN 100.5 F  *CHILLS WITH OR WITHOUT FEVER  NAUSEA AND VOMITING THAT IS NOT CONTROLLED WITH YOUR NAUSEA MEDICATION  *UNUSUAL SHORTNESS OF BREATH  *UNUSUAL BRUISING OR BLEEDING  TENDERNESS IN MOUTH AND THROAT WITH OR WITHOUT PRESENCE OF ULCERS  *URINARY PROBLEMS  *BOWEL PROBLEMS  UNUSUAL RASH Items with * indicate a potential emergency and should be followed up as soon as possible.  Feel free to call the clinic you have any questions or concerns. The clinic phone number is [phone].  Please show the [facility] at check-in to the Emergency Department and triage nurse.  Cyclophosphamide injection What is this medicine? CYCLOPHOSPHAMIDE (sye kloe FOSS fa mide) is a chemotherapy drug. It slows the growth of cancer cells. This medicine is used to treat many types of cancer like lymphoma, myeloma, leukemia, breast cancer, and ovarian cancer, to name a few. This medicine may be used for other purposes; ask your health care provider or pharmacist if you have questions. COMMON BRAND NAME(S): Cytoxan, Neosar What should I tell my health care provider before I take this medicine? They need to know if you have any of these conditions: -blood disorders -history of other chemotherapy -infection -kidney disease -liver disease -recent or ongoing radiation therapy -tumors in the bone marrow -an unusual or allergic reaction to cyclophosphamide, other chemotherapy, other medicines, foods, dyes, or preservatives -pregnant  or trying to get pregnant -breast-feeding How should I use this medicine? This drug is usually given as an injection into a vein or muscle or by infusion into a vein. It is administered in a hospital or clinic by a specially trained health care professional. Talk to your pediatrician regarding the use of this medicine in children. Special care may be needed. Overdosage: If you think you have taken too much of this medicine contact a poison control center or emergency room at once. NOTE: This medicine is only for you. Do not share this medicine with others. What if I miss a dose? It is important not to miss your dose. Call your doctor or health care professional if you are unable to keep an appointment. What may interact with this medicine? This medicine may interact with the following medications: -amiodarone -amphotericin B -azathioprine -certain antiviral medicines for HIV or AIDS such as protease inhibitors (e.g., indinavir, ritonavir) and zidovudine -certain blood pressure medications such as benazepril, captopril, enalapril, fosinopril, lisinopril, moexipril, monopril, perindopril, quinapril, ramipril, trandolapril -certain cancer medications such as anthracyclines (e.g., daunorubicin, doxorubicin), busulfan, cytarabine, paclitaxel, pentostatin, tamoxifen, trastuzumab -certain diuretics such as chlorothiazide, chlorthalidone, hydrochlorothiazide, indapamide, metolazone -certain medicines that treat or prevent blood clots like warfarin -certain muscle relaxants such as succinylcholine -cyclosporine -etanercept -indomethacin -medicines to increase blood counts like filgrastim, pegfilgrastim, sargramostim -medicines used as general anesthesia -metronidazole -natalizumab This list may not describe all possible interactions. Give your health care provider a list of all the medicines, herbs, non-prescription drugs, or dietary supplements you use. Also tell them if you smoke, drink alcohol,  or use illegal drugs. Some items may interact with your medicine. What should I  watch for while using this medicine? Visit your doctor for checks on your progress. This drug may make you feel generally unwell. This is not uncommon, as chemotherapy can affect healthy cells as well as cancer cells. Report any side effects. Continue your course of treatment even though you feel ill unless your doctor tells you to stop. Drink water or other fluids as directed. Urinate often, even at night. In some cases, you may be given additional medicines to help with side effects. Follow all directions for their use. Call your doctor or health care professional for advice if you get a fever, chills or sore throat, or other symptoms of a cold or flu. Do not treat yourself. This drug decreases your body's ability to fight infections. Try to avoid being around people who are sick. This medicine may increase your risk to bruise or bleed. Call your doctor or health care professional if you notice any unusual bleeding. Be careful brushing and flossing your teeth or using a toothpick because you may get an infection or bleed more easily. If you have any dental work done, tell your dentist you are receiving this medicine. You may get drowsy or dizzy. Do not drive, use machinery, or do anything that needs mental alertness until you know how this medicine affects you. Do not become pregnant while taking this medicine or for 1 year after stopping it. Women should inform their doctor if they wish to become pregnant or think they might be pregnant. Men should not father a child while taking this medicine and for 4 months after stopping it. There is a potential for serious side effects to an unborn child. Talk to your health care professional or pharmacist for more information. Do not breast-feed an infant while taking this medicine. This medicine may interfere with the ability to have a child. This medicine has caused ovarian failure  in some women. This medicine has caused reduced sperm counts in some men. You should talk with your doctor or health care professional if you are concerned about your fertility. If you are going to have surgery, tell your doctor or health care professional that you have taken this medicine. What side effects may I notice from receiving this medicine? Side effects that you should report to your doctor or health care professional as soon as possible: -allergic reactions like skin rash, itching or hives, swelling of the face, lips, or tongue -low blood counts - this medicine may decrease the number of white blood cells, red blood cells and platelets. You may be at increased risk for infections and bleeding. -signs of infection - fever or chills, cough, sore throat, pain or difficulty passing urine -signs of decreased platelets or bleeding - bruising, pinpoint red spots on the skin, black, tarry stools, blood in the urine -signs of decreased red blood cells - unusually weak or tired, fainting spells, lightheadedness -breathing problems -dark urine -dizziness -palpitations -swelling of the ankles, feet, hands -trouble passing urine or change in the amount of urine -weight gain -yellowing of the eyes or skin Side effects that usually do not require medical attention (report to your doctor or health care professional if they continue or are bothersome): -changes in nail or skin color -hair loss -missed menstrual periods -mouth sores -nausea, vomiting This list may not describe all possible side effects. Call your doctor for medical advice about side effects. You may report side effects to FDA at 1-800-FDA-1088. Where should I keep my medicine? This drug is given in a  hospital or clinic and will not be stored at home. NOTE: This sheet is a summary. It may not cover all possible information. If you have questions about this medicine, talk to your doctor, pharmacist, or health care provider.  2015,  Elsevier/Gold Standard. (2012-10-20 16:22:58) Bevacizumab injection What is this medicine? BEVACIZUMAB (be va SIZ yoo mab) is a chemotherapy drug. It targets a protein found in many cancer cell types, and halts cancer growth. This drug treats many cancers including non-small cell lung cancer, ovarian cancer, cervical cancer, and colon or rectal cancer. It is usually given with other chemotherapy drugs. This medicine may be used for other purposes; ask your health care provider or pharmacist if you have questions. COMMON BRAND NAME(S): Avastin What should I tell my health care provider before I take this medicine? They need to know if you have any of these conditions: -blood clots -heart disease, including heart failure, heart attack, or chest pain (angina) -high blood pressure -infection (especially a virus infection such as chickenpox, cold sores, or herpes) -kidney disease -lung disease -prior chemotherapy with doxorubicin, daunorubicin, epirubicin, or other anthracycline type chemotherapy agents -recent or ongoing radiation therapy -recent surgery -stroke -an unusual or allergic reaction to bevacizumab, hamster proteins, mouse proteins, other medicines, foods, dyes, or preservatives -pregnant or trying to get pregnant -breast-feeding How should I use this medicine? This medicine is for infusion into a vein. It is given by a health care professional in a hospital or clinic setting. Talk to your pediatrician regarding the use of this medicine in children. Special care may be needed. Overdosage: If you think you have taken too much of this medicine contact a poison control center or emergency room at once. NOTE: This medicine is only for you. Do not share this medicine with others. What if I miss a dose? It is important not to miss your dose. Call your doctor or health care professional if you are unable to keep an appointment. What may interact with this medicine? Interactions are not  expected. This list may not describe all possible interactions. Give your health care provider a list of all the medicines, herbs, non-prescription drugs, or dietary supplements you use. Also tell them if you smoke, drink alcohol, or use illegal drugs. Some items may interact with your medicine. What should I watch for while using this medicine? Your condition will be monitored carefully while you are receiving this medicine. You will need important blood work and urine testing done while you are taking this medicine. During your treatment, let your health care professional know if you have any unusual symptoms, such as difficulty breathing. This medicine may rarely cause 'gastrointestinal perforation' (holes in the stomach, intestines or colon), a serious side effect requiring surgery to repair. This medicine should be started at least 28 days following major surgery and the site of the surgery should be totally healed. Check with your doctor before scheduling dental work or surgery while you are receiving this treatment. Talk to your doctor if you have recently had surgery or if you have a wound that has not healed. Do not become pregnant while taking this medicine. Women should inform their doctor if they wish to become pregnant or think they might be pregnant. There is a potential for serious side effects to an unborn child. Talk to your health care professional or pharmacist for more information. Do not breast-feed an infant while taking this medicine. This medicine has caused ovarian failure in some women. This medicine may interfere with  the ability to have a child. You should talk to your doctor or health care professional if you are concerned about your fertility. What side effects may I notice from receiving this medicine? Side effects that you should report to your doctor or health care professional as soon as possible: -allergic reactions like skin rash, itching or hives, swelling of the face,  lips, or tongue -signs of infection - fever or chills, cough, sore throat, pain or trouble passing urine -signs of decreased platelets or bleeding - bruising, pinpoint red spots on the skin, black, tarry stools, nosebleeds, blood in the urine -breathing problems -changes in vision -chest pain -confusion -jaw pain, especially after dental work -mouth sores -seizures -severe abdominal pain -severe headache -sudden numbness or weakness of the face, arm or leg -swelling of legs or ankles -symptoms of a stroke: change in mental awareness, inability to talk or move one side of the body (especially in patients with lung cancer) -trouble passing urine or change in the amount of urine -trouble speaking or understanding -trouble walking, dizziness, loss of balance or coordination Side effects that usually do not require medical attention (report to your doctor or health care professional if they continue or are bothersome): -constipation -diarrhea -dry skin -headache -loss of appetite -nausea, vomiting This list may not describe all possible side effects. Call your doctor for medical advice about side effects. You may report side effects to FDA at 1-800-FDA-1088. Where should I keep my medicine? This drug is given in a hospital or clinic and will not be stored at home. NOTE: This sheet is a summary. It may not cover all possible information. If you have questions about this medicine, talk to your doctor, pharmacist, or health care provider.  2015, Elsevier/Gold Standard. (2013-11-06 11:38:34)

## 2015-05-22 ENCOUNTER — Other Ambulatory Visit: Payer: Self-pay | Admitting: *Deleted

## 2015-05-22 DIAGNOSIS — C569 Malignant neoplasm of unspecified ovary: Secondary | ICD-10-CM

## 2015-05-23 ENCOUNTER — Ambulatory Visit: Payer: Medicare Other

## 2015-05-23 ENCOUNTER — Other Ambulatory Visit (HOSPITAL_BASED_OUTPATIENT_CLINIC_OR_DEPARTMENT_OTHER): Payer: Medicare Other

## 2015-05-23 ENCOUNTER — Ambulatory Visit (HOSPITAL_BASED_OUTPATIENT_CLINIC_OR_DEPARTMENT_OTHER): Payer: Medicare Other | Admitting: Nurse Practitioner

## 2015-05-23 ENCOUNTER — Encounter: Payer: Self-pay | Admitting: Nurse Practitioner

## 2015-05-23 ENCOUNTER — Telehealth: Payer: Self-pay | Admitting: Nurse Practitioner

## 2015-05-23 VITALS — BP 157/67 | HR 82 | Temp 97.7°F | Resp 18 | Ht 59.0 in | Wt 120.2 lb

## 2015-05-23 DIAGNOSIS — C569 Malignant neoplasm of unspecified ovary: Secondary | ICD-10-CM

## 2015-05-23 LAB — CBC WITH DIFFERENTIAL/PLATELET
BASO%: 0.9 % (ref 0.0–2.0)
BASOS ABS: 0 10*3/uL (ref 0.0–0.1)
EOS ABS: 0 10*3/uL (ref 0.0–0.5)
EOS%: 1.1 % (ref 0.0–7.0)
HCT: 36.4 % (ref 34.8–46.6)
HGB: 11.9 g/dL (ref 11.6–15.9)
LYMPH%: 18.6 % (ref 14.0–49.7)
MCH: 31.3 pg (ref 25.1–34.0)
MCHC: 32.8 g/dL (ref 31.5–36.0)
MCV: 95.5 fL (ref 79.5–101.0)
MONO#: 0.3 10*3/uL (ref 0.1–0.9)
MONO%: 9.4 % (ref 0.0–14.0)
NEUT#: 2.1 10*3/uL (ref 1.5–6.5)
NEUT%: 70 % (ref 38.4–76.8)
Platelets: 112 10*3/uL — ABNORMAL LOW (ref 145–400)
RBC: 3.82 10*6/uL (ref 3.70–5.45)
RDW: 14.9 % — ABNORMAL HIGH (ref 11.2–14.5)
WBC: 3 10*3/uL — AB (ref 3.9–10.3)
lymph#: 0.6 10*3/uL — ABNORMAL LOW (ref 0.9–3.3)

## 2015-05-23 LAB — COMPREHENSIVE METABOLIC PANEL (CC13)
ALT: 15 U/L (ref 0–55)
ANION GAP: 9 meq/L (ref 3–11)
AST: 22 U/L (ref 5–34)
Albumin: 3.6 g/dL (ref 3.5–5.0)
Alkaline Phosphatase: 84 U/L (ref 40–150)
BUN: 21.4 mg/dL (ref 7.0–26.0)
CALCIUM: 9 mg/dL (ref 8.4–10.4)
CO2: 27 mEq/L (ref 22–29)
CREATININE: 0.8 mg/dL (ref 0.6–1.1)
Chloride: 105 mEq/L (ref 98–109)
EGFR: 72 mL/min/{1.73_m2} — AB (ref >90)
Glucose: 102 mg/dl (ref 70–140)
Potassium: 4.2 mEq/L (ref 3.5–5.1)
Sodium: 141 mEq/L (ref 136–145)
Total Bilirubin: 0.51 mg/dL (ref 0.20–1.20)
Total Protein: 6.5 g/dL (ref 6.4–8.3)

## 2015-05-23 NOTE — Progress Notes (Signed)
ID: [name]   DOB: January 02, 1941  MR#: [id number]  WTU#:[id number]   PCP:  Marjorie Smolder, MD GYN:  [name], MD OTHER:  [name], MD  CHIEF COMPLAINT:  Recurrent Ovarian Cancer  CURRENT TREATMENT:  cyclophosphamide and carboplatin  NOTE: THIS PATIENT REQUESTS THAT ABSOLUTELY NO NUMERIC INFORMATION BE GIVEN REGARDING ANY LAB AND PARTICULARLY DOES NOT WANT TO KNOW SPECIFIC CA-125 RESULTS  HISTORY OF PRESENT ILLNESS: Antavia Tandy was initially diagnosed with ovarian cancer in July of 2002. She underwent debulking surgery at that time for what proved to be a 4 of 18 lymph nodes positive disease. She was initially treated with 6 cycles of paclitaxel and carboplatin adjuvantly, completed in December of 2002.  Patient has had multiple recurrences since December of 2002, and has been treated with multiple agents as noted below.  INTERVAL HISTORY: Moncerrath Berhe returns today for follow up of her ovarian cancer. Today is day 8, cycle 8 of cyclophosphamide and bevacizumab. She tolerates this regimen well with few complaints. Her most notable concern is elevated blood pressure. She takes lisinopril 20mg  daily with 12.5mg  HCTZ just to get her pressure back down to her SBP baseline of 140s-150s.   REVIEW OF SYSTEMS: Lakeysha Slutsky denies fevers, chills, or changes in bowel or bladder habit. She has some mild queeziness after treatment, but this is managed well by her PRN antiemetics. Her appetite is healthy and she is drinking well. Her energy level is good during the day, and she sleeps well at night. She denies shortness of breath, chest pain, cough, or palpitations. She has no headaches, dizziness, unexplained weight loss, or night sweats. A detailed review of systems is otherwise stable.  PAST MEDICAL HISTORY: Past Medical History  Diagnosis Date  . Shingles   . Ovarian cancer 11/17/2011  . Cancer   . 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 Ganglion cyst excision Left     Wrist    FAMILY HISTORY Family History  Problem Relation Age of Onset  . Hypertension Mother   . GER disease Mother   . Cancer Father     Cancer (stomach)    GYNECOLOGIC HISTORY: GXP2  SOCIAL HISTORY: (Updated 03/08/2014) [name] lives alone. She works temporary jobs, sometimes as a Oceanographer, and sometimes grading tests.  As of March 2015, she is working full-time hours at the [employer] center. She's primary caregiver for her and who is currently 24 and lives in [place].  Her daughter [name] lives in [place] and is Mudlogger of the epidemiology and [place] there; she is married with 2 children. Son  [name] lives in [place], [place]. He has 4 children of his own.His wife is a Marine scientist  ADVANCED DIRECTIVES: in place, but the patient has not been able to find the documents. At the 08/15/2014 visit she was given the appropriate living will and health Department to you documents to complete and notarize at her discretion.  HEALTH MAINTENANCE:  (Updated  03/08/2014) History  Substance Use Topics  . Smoking status: Former Research scientist (life sciences)  . Smokeless tobacco: Never Used  . Alcohol Use: No     Comment: Occasionally     Colonoscopy: Not on file  PAP:   Bone density: Not on file  Lipid panel: Not on file  Mammogram:  Feb 2103, [facility]   Allergies  Allergen Reactions  . Penicillins Rash    Current Outpatient Prescriptions  Medication Sig Dispense  Refill  . hydrochlorothiazide (MICROZIDE) 12.5 MG capsule TAKE 1 CAPSULE (12.5 MG TOTAL) BY MOUTH DAILY. 30 capsule 0  . lisinopril (PRINIVIL,ZESTRIL) 20 MG tablet Take 1 tablet (20 mg total) by mouth daily. 30 tablet 1  . Melatonin 3 MG TABS Take 3 mg by mouth at bedtime as needed.    . Alum & Mag Hydroxide-Simeth (MAGIC MOUTHWASH W/LIDOCAINE) SOLN Take 5 mLs by mouth 4 (four) times daily as needed. (Patient not taking: Reported on 02/21/2015) 240 mL 2  . dexamethasone (DECADRON) 4 MG  tablet Take 4 mg by mouth as needed.    . loratadine (CLARITIN) 10 MG tablet Take 10 mg by mouth daily as needed for allergies.    . [DISCONTINUED] prochlorperazine (COMPAZINE) 10 MG tablet Take 1 tablet (10 mg total) by mouth every 6 (six) hours as needed (Nausea or vomiting). 30 tablet 1   No current facility-administered medications for this visit.   Facility-Administered Medications Ordered in Other Visits  Medication Dose Route Frequency Provider Last Rate Last Dose  . sodium chloride 0.9 % injection 10 mL  10 mL Intravenous PRN Chauncey Cruel, MD   10 mL at 05/24/14 1601    OBJECTIVE: Middle-aged white woman in no acute distress Filed Vitals:   05/23/15 1414  BP: 157/67  Pulse: 82  Temp: 97.7 F (36.5 C)  Resp: 18     Body mass index is 24.26 kg/(m^2).    ECOG FS: 0  Current weight 120 pounds, usual range 119 to 128  Skin: warm, dry  HEENT: sclerae anicteric, conjunctivae pink, oropharynx clear. No thrush or mucositis.  Lymph Nodes: No cervical or supraclavicular lymphadenopathy  Lungs: clear to auscultation bilaterally, no rales, wheezes, or rhonci  Heart: regular rate and rhythm  Abdomen: round, soft, non tender, positive bowel sounds  Musculoskeletal: No focal spinal tenderness, no peripheral edema  Neuro: non focal, well oriented, positive affect  Breasts: deferred  LAB RESULTS:  Lab Results  Component Value Date   WBC 3.0* 05/23/2015   NEUTROABS 2.1 05/23/2015   HGB 11.9 05/23/2015   HCT 36.4 05/23/2015   MCV 95.5 05/23/2015   PLT 112* 05/23/2015        Chemistry      Component Value Date/Time   NA 141 05/23/2015 1356   NA 140 07/04/2012 0832   K 4.2 05/23/2015 1356   K 4.2 07/04/2012 0832   CL 106 05/09/2013 1304   CL 106 07/04/2012 0832   CO2 27 05/23/2015 1356   CO2 26 07/04/2012 0832   BUN 21.4 05/23/2015 1356   BUN 17 07/04/2012 0832   CREATININE 0.8 05/23/2015 1356   CREATININE 0.82 07/04/2012 0832      Component Value Date/Time    CALCIUM 9.0 05/23/2015 1356   CALCIUM 9.2 07/04/2012 0832   ALKPHOS 84 05/23/2015 1356   ALKPHOS 71 07/04/2012 0832   AST 22 05/23/2015 1356   AST 18 07/04/2012 0832   ALT 15 05/23/2015 1356   ALT 12 07/04/2012 0832   BILITOT 0.51 05/23/2015 1356   BILITOT 0.7 07/04/2012 0832     Results for EMON, MIGGINS (MRN [id number]) as of 05/23/2015 15:32  Ref. Range 10/10/2014 13:45 10/10/2014 13:45 11/21/2014 15:48 01/14/2015 15:12 02/05/2015 13:17 02/21/2015 13:09 03/21/2015 14:41 04/04/2015 15:03  CA 125 Latest Ref Range: <35 U/mL 2835 (H) 1787.4 (H) 2330 (H) 2733 (H) 2614 (H) 2502 (H) 2540 (H) 2808 (H)    STUDIES: No results found.  ASSESSMENT: 74 y.o.  [place] woman (1)  with a history of ovarian cancer dating back to July 2002 when she underwent debulking surgery for what proved to be 4/18 lymph node positive disease.   (2)  She received 6 cycles of paclitaxel/carboplatin adjuvantly completed in December 2002.   (3)  She had her first recurrence in December 2005 treated with paclitaxel;   (4)  second recurrence May 2007 treated with [place] and paclitaxel;   (5)  third recurrence January 2010 treated with topotecan and Gemzar;   (6)  fourth recurrence January 2012 treated with single-agent carboplatin (January to August 2012) with initial response, then plateau; with progression January 2013 (less than 6 months after last carbo dose) treated with single-agent gemzar given Q2 weeks (January to April 2013), with evidence of progression  (7) Doxil strartedJuly 2013 6, with evidence of initial response, and fair tolerance. Held between May and August 2014 due to patient's work schedule and scheduling issues.  Resumed treatment as of 08/24/2013, with some delays secondary to the patient's schedule. As of early December 2014, her total cumulative dose of Doxil reached 560 mg per meter square. In addition there was also an upward trend of the CA 125. Doxil was discontinued (last dose 10/30/2013).  Echocardiogram on 11/14/2013 showed a well preserved ejection fraction of 55-60%.  (8)  treated with single agent paclitaxel, given on days one and 8 of each 21 day cycle, first dose on 12/27/2013,with continuing response, but mild to moderate toxicity; 11th and last dose 04/12/2014   (9) carboplatin and cyclophosphamide started on 08/28/14 given every 21 days-- stopped 10/10/2014 after a reaction to carboplatin (also no evidence of response)  (10) cyclophosphamide and bevacizumab to start 12/02/2014, to be repeated every 21 days  PLAN:  Opal Sidles continues to manage treatment well. The labs were reviewed in detail and were stable. The CA 125 orders expired and she has not had this drawn since mid April. Due to her request to not discuss labs at all, despite leaving out specifics, she will simply have them drawn again in 2 weeks with her next treatment. The last level drawn showed an upward trend. She will continue on the cyclophosphamide and bevacizumab until the new level returns and Dr. Jana Hakim has had a chance to review.   Additionally,she has not had scans in 6 months, so I have placed an order to be performed in the next month. Opal Sidles plans to go out of town for the 4th of July and has agreed to have it done when she returns.   I have made Opal Sidles an appointment on 7/13 with Dr. Jana Hakim to discuss the results. She understands and agrees with this plan. She has been encouraged to call with any issues that might arise before her next visit here.   Laurie Panda, NP     05/23/2015

## 2015-05-23 NOTE — Telephone Encounter (Signed)
Appointments made and avs printed for patient °

## 2015-05-29 ENCOUNTER — Other Ambulatory Visit: Payer: Self-pay | Admitting: Oncology

## 2015-05-29 NOTE — Telephone Encounter (Signed)
Last OV 05/23/15.  Next OV 07/02/15.  Chart reviewed.

## 2015-06-06 ENCOUNTER — Other Ambulatory Visit (HOSPITAL_BASED_OUTPATIENT_CLINIC_OR_DEPARTMENT_OTHER): Payer: Medicare Other

## 2015-06-06 ENCOUNTER — Other Ambulatory Visit: Payer: Self-pay | Admitting: Nurse Practitioner

## 2015-06-06 ENCOUNTER — Other Ambulatory Visit: Payer: Medicare Other

## 2015-06-06 ENCOUNTER — Ambulatory Visit: Payer: Medicare Other

## 2015-06-06 ENCOUNTER — Ambulatory Visit (HOSPITAL_BASED_OUTPATIENT_CLINIC_OR_DEPARTMENT_OTHER): Payer: Medicare Other

## 2015-06-06 ENCOUNTER — Other Ambulatory Visit: Payer: Self-pay | Admitting: Oncology

## 2015-06-06 VITALS — BP 154/79 | HR 79 | Temp 97.8°F | Resp 18

## 2015-06-06 DIAGNOSIS — C569 Malignant neoplasm of unspecified ovary: Secondary | ICD-10-CM

## 2015-06-06 DIAGNOSIS — Z5111 Encounter for antineoplastic chemotherapy: Secondary | ICD-10-CM | POA: Diagnosis not present

## 2015-06-06 DIAGNOSIS — Z5112 Encounter for antineoplastic immunotherapy: Secondary | ICD-10-CM | POA: Diagnosis not present

## 2015-06-06 LAB — COMPREHENSIVE METABOLIC PANEL (CC13)
ALT: 14 U/L (ref 0–55)
ANION GAP: 6 meq/L (ref 3–11)
AST: 22 U/L (ref 5–34)
Albumin: 3.6 g/dL (ref 3.5–5.0)
Alkaline Phosphatase: 91 U/L (ref 40–150)
BUN: 22.2 mg/dL (ref 7.0–26.0)
CALCIUM: 9.3 mg/dL (ref 8.4–10.4)
CHLORIDE: 106 meq/L (ref 98–109)
CO2: 28 meq/L (ref 22–29)
CREATININE: 0.9 mg/dL (ref 0.6–1.1)
EGFR: 62 mL/min/{1.73_m2} — ABNORMAL LOW (ref >90)
Glucose: 94 mg/dl (ref 70–140)
Potassium: 3.8 mEq/L (ref 3.5–5.1)
Sodium: 140 mEq/L (ref 136–145)
Total Bilirubin: 0.48 mg/dL (ref 0.20–1.20)
Total Protein: 6.4 g/dL (ref 6.4–8.3)

## 2015-06-06 LAB — CBC WITH DIFFERENTIAL/PLATELET
BASO%: 1 % (ref 0.0–2.0)
Basophils Absolute: 0 10*3/uL (ref 0.0–0.1)
EOS%: 0.9 % (ref 0.0–7.0)
Eosinophils Absolute: 0 10*3/uL (ref 0.0–0.5)
HCT: 36.2 % (ref 34.8–46.6)
HGB: 12 g/dL (ref 11.6–15.9)
LYMPH%: 23.2 % (ref 14.0–49.7)
MCH: 31.9 pg (ref 25.1–34.0)
MCHC: 33.2 g/dL (ref 31.5–36.0)
MCV: 96.1 fL (ref 79.5–101.0)
MONO#: 0.6 10*3/uL (ref 0.1–0.9)
MONO%: 15.8 % — ABNORMAL HIGH (ref 0.0–14.0)
NEUT#: 2.1 10*3/uL (ref 1.5–6.5)
NEUT%: 59.1 % (ref 38.4–76.8)
Platelets: 108 10*3/uL — ABNORMAL LOW (ref 145–400)
RBC: 3.76 10*6/uL (ref 3.70–5.45)
RDW: 15.4 % — ABNORMAL HIGH (ref 11.2–14.5)
WBC: 3.6 10*3/uL — AB (ref 3.9–10.3)
lymph#: 0.8 10*3/uL — ABNORMAL LOW (ref 0.9–3.3)

## 2015-06-06 LAB — UA PROTEIN, DIPSTICK - CHCC: Protein, ur: NEGATIVE mg/dL

## 2015-06-06 MED ORDER — SODIUM CHLORIDE 0.9 % IV SOLN
800.0000 mg | Freq: Once | INTRAVENOUS | Status: AC
  Administered 2015-06-06: 800 mg via INTRAVENOUS
  Filled 2015-06-06: qty 32

## 2015-06-06 MED ORDER — SODIUM CHLORIDE 0.9 % IV SOLN
Freq: Once | INTRAVENOUS | Status: AC
  Administered 2015-06-06: 16:00:00 via INTRAVENOUS
  Filled 2015-06-06: qty 4

## 2015-06-06 MED ORDER — SODIUM CHLORIDE 0.9 % IV SOLN
Freq: Once | INTRAVENOUS | Status: AC
  Administered 2015-06-06: 16:00:00 via INTRAVENOUS

## 2015-06-06 MED ORDER — HEPARIN SOD (PORK) LOCK FLUSH 100 UNIT/ML IV SOLN
500.0000 [IU] | Freq: Once | INTRAVENOUS | Status: AC | PRN
  Administered 2015-06-06: 500 [IU]
  Filled 2015-06-06: qty 5

## 2015-06-06 MED ORDER — SODIUM CHLORIDE 0.9 % IJ SOLN
10.0000 mL | INTRAMUSCULAR | Status: DC | PRN
  Administered 2015-06-06: 10 mL
  Filled 2015-06-06: qty 10

## 2015-06-06 MED ORDER — SODIUM CHLORIDE 0.9 % IV SOLN
600.0000 mg/m2 | Freq: Once | INTRAVENOUS | Status: AC
  Administered 2015-06-06: 920 mg via INTRAVENOUS
  Filled 2015-06-06: qty 46

## 2015-06-06 NOTE — Patient Instructions (Signed)
[facility] Discharge Instructions for Patients Receiving Chemotherapy  Today you received the following chemotherapy agents Avastin/Cytoxan.  To help prevent nausea and vomiting after your treatment, we encourage you to take your nausea medication as directed.   If you develop nausea and vomiting that is not controlled by your nausea medication, call the clinic.   BELOW ARE SYMPTOMS THAT SHOULD BE REPORTED IMMEDIATELY:  *FEVER GREATER THAN 100.5 F  *CHILLS WITH OR WITHOUT FEVER  NAUSEA AND VOMITING THAT IS NOT CONTROLLED WITH YOUR NAUSEA MEDICATION  *UNUSUAL SHORTNESS OF BREATH  *UNUSUAL BRUISING OR BLEEDING  TENDERNESS IN MOUTH AND THROAT WITH OR WITHOUT PRESENCE OF ULCERS  *URINARY PROBLEMS  *BOWEL PROBLEMS  UNUSUAL RASH Items with * indicate a potential emergency and should be followed up as soon as possible.  Feel free to call the clinic you have any questions or concerns. The clinic phone number is [phone].  Please show the [facility] at check-in to the Emergency Department and triage nurse.

## 2015-06-07 LAB — CA 125: CA 125: 3100 U/mL — ABNORMAL HIGH (ref <35)

## 2015-06-11 ENCOUNTER — Other Ambulatory Visit: Payer: Self-pay | Admitting: Oncology

## 2015-06-11 NOTE — Progress Notes (Unsigned)
Cassandra Nichols appears to be rising. She is already scheduled for restaging CT scans and to see me within the next 3 weeks. We will likely change therapy at that time.

## 2015-06-16 ENCOUNTER — Other Ambulatory Visit: Payer: Self-pay | Admitting: Oncology

## 2015-06-19 ENCOUNTER — Encounter (HOSPITAL_COMMUNITY): Payer: Self-pay

## 2015-06-19 ENCOUNTER — Ambulatory Visit (HOSPITAL_COMMUNITY)
Admission: RE | Admit: 2015-06-19 | Discharge: 2015-06-19 | Disposition: A | Discharge status: Home / Self Care | Payer: Medicare Other | Source: Ambulatory Visit | Attending: Nurse Practitioner | Admitting: Nurse Practitioner

## 2015-06-19 DIAGNOSIS — C786 Secondary malignant neoplasm of retroperitoneum and peritoneum: Secondary | ICD-10-CM | POA: Diagnosis not present

## 2015-06-19 DIAGNOSIS — C569 Malignant neoplasm of unspecified ovary: Secondary | ICD-10-CM | POA: Insufficient documentation

## 2015-06-19 MED ORDER — IOHEXOL 300 MG/ML  SOLN
100.0000 mL | Freq: Once | INTRAMUSCULAR | Status: AC | PRN
  Administered 2015-06-19: 100 mL via INTRAVENOUS

## 2015-06-27 ENCOUNTER — Ambulatory Visit (HOSPITAL_COMMUNITY): Payer: Medicare Other

## 2015-06-27 ENCOUNTER — Ambulatory Visit: Payer: Medicare Other

## 2015-06-27 ENCOUNTER — Other Ambulatory Visit: Payer: Medicare Other

## 2015-06-30 ENCOUNTER — Other Ambulatory Visit: Payer: Self-pay | Admitting: Oncology

## 2015-07-02 ENCOUNTER — Other Ambulatory Visit (HOSPITAL_BASED_OUTPATIENT_CLINIC_OR_DEPARTMENT_OTHER): Payer: Medicare Other

## 2015-07-02 ENCOUNTER — Telehealth: Payer: Self-pay | Admitting: Oncology

## 2015-07-02 ENCOUNTER — Ambulatory Visit (HOSPITAL_BASED_OUTPATIENT_CLINIC_OR_DEPARTMENT_OTHER): Payer: Medicare Other | Admitting: Oncology

## 2015-07-02 VITALS — BP 151/87 | HR 79 | Temp 97.9°F | Resp 18 | Ht 59.0 in | Wt 119.4 lb

## 2015-07-02 DIAGNOSIS — C569 Malignant neoplasm of unspecified ovary: Secondary | ICD-10-CM

## 2015-07-02 LAB — COMPREHENSIVE METABOLIC PANEL (CC13)
ALK PHOS: 86 U/L (ref 40–150)
ALT: 18 U/L (ref 0–55)
ANION GAP: 9 meq/L (ref 3–11)
AST: 25 U/L (ref 5–34)
Albumin: 3.8 g/dL (ref 3.5–5.0)
BILIRUBIN TOTAL: 0.65 mg/dL (ref 0.20–1.20)
BUN: 19.4 mg/dL (ref 7.0–26.0)
CALCIUM: 9.7 mg/dL (ref 8.4–10.4)
CO2: 24 mEq/L (ref 22–29)
Chloride: 107 mEq/L (ref 98–109)
Creatinine: 0.9 mg/dL (ref 0.6–1.1)
EGFR: 63 mL/min/{1.73_m2} — ABNORMAL LOW (ref >90)
Glucose: 106 mg/dl (ref 70–140)
Potassium: 4.1 mEq/L (ref 3.5–5.1)
Sodium: 140 mEq/L (ref 136–145)
TOTAL PROTEIN: 6.8 g/dL (ref 6.4–8.3)

## 2015-07-02 LAB — CBC WITH DIFFERENTIAL/PLATELET
BASO%: 1 % (ref 0.0–2.0)
BASOS ABS: 0 10*3/uL (ref 0.0–0.1)
EOS%: 1.1 % (ref 0.0–7.0)
Eosinophils Absolute: 0 10*3/uL (ref 0.0–0.5)
HCT: 36.4 % (ref 34.8–46.6)
HEMOGLOBIN: 12 g/dL (ref 11.6–15.9)
LYMPH#: 0.5 10*3/uL — AB (ref 0.9–3.3)
LYMPH%: 15.3 % (ref 14.0–49.7)
MCH: 32.1 pg (ref 25.1–34.0)
MCHC: 32.9 g/dL (ref 31.5–36.0)
MCV: 97.5 fL (ref 79.5–101.0)
MONO#: 0.3 10*3/uL (ref 0.1–0.9)
MONO%: 9.7 % (ref 0.0–14.0)
NEUT#: 2.2 10*3/uL (ref 1.5–6.5)
NEUT%: 72.9 % (ref 38.4–76.8)
Platelets: 93 10*3/uL — ABNORMAL LOW (ref 145–400)
RBC: 3.74 10*6/uL (ref 3.70–5.45)
RDW: 15.8 % — ABNORMAL HIGH (ref 11.2–14.5)
WBC: 3 10*3/uL — ABNORMAL LOW (ref 3.9–10.3)

## 2015-07-02 LAB — UA PROTEIN, DIPSTICK - CHCC: Protein, ur: NEGATIVE mg/dL

## 2015-07-02 NOTE — Telephone Encounter (Signed)
Gave patient avs report and appointments for July thru October. No availability with GM late October - message to [facility] re f/u date/time - tx 10/28. Patient may need to change 8/26 appointments as she may be out of town and will let us know by 8/19.

## 2015-07-02 NOTE — Progress Notes (Signed)
ID: Cassandra Nichols   DOB: [birth year]  MR#: [id number]  TMH#:[id number]   PCP:  Cassandra Smolder, MD GYN:  Cassandra Laroche, MD OTHER:  Cassandra Peach, MD  CHIEF COMPLAINT:  Recurrent Ovarian Cancer  CURRENT TREATMENT:  cyclophosphamide and carboplatin  NOTE: THIS PATIENT REQUESTS THAT ABSOLUTELY NO NUMERIC INFORMATION BE GIVEN REGARDING ANY LAB AND PARTICULARLY DOES NOT WANT TO KNOW SPECIFIC CA-125 RESULTS  HISTORY OF PRESENT ILLNESS: Cassandra Nichols was initially diagnosed with ovarian cancer in July of 2002. She underwent debulking surgery at that time for what proved to be a 4 of 18 lymph nodes positive disease. She was initially treated with 6 cycles of paclitaxel and carboplatin adjuvantly, completed in December of 2002.  Patient has had multiple recurrences since December of 2002, and has been treated with multiple agents as noted below.  INTERVAL HISTORY: Cassandra Nichols returns today for follow up of her ovarian cancer. She was due for cyclophosphamide and bevacizumab last Friday but "it dropped from my calendar". On the plus side, she has been visiting friends in [place], had her 44 year old grandson Cassandra Nichols help her do yard work, and is planning a trip to [place] in September. 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 However she tells me she is getting nasty calls from billing related to overdue debtst even though she says she makes her monthly payments like she always has   REVIEW OF SYSTEMS: Cassandra Nichols denies abdominal distention or discomfort, denies difficulty with bowel movements or urine, denies jaundice, and also has had no bleeding except for one very minimal episode of epistaxis a few weeks ago. She gets "down" sometimes, but is trying to remain on the positive side. She is going to be going to the pool this afternoon with her grandson and looking for to that. A detailed review of systems today was otherwise stable  PAST  MEDICAL HISTORY: Past Medical History  Diagnosis Date  . Shingles   . 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 Ovarian cancer 11/17/2011  . Cancer     PAST SURGICAL HISTORY: Past Surgical History  Procedure Laterality Date  . Ganglion cyst excision Left     Wrist    FAMILY HISTORY Family History  Problem Relation Age of Onset  . Hypertension Mother   . GER disease Mother   . Cancer Father     Cancer (stomach)    GYNECOLOGIC HISTORY: GXP2  SOCIAL HISTORY: (Updated 03/08/2014) Cassandra Nichols lives alone. She works temporary jobs, sometimes as a Oceanographer, and sometimes grading tests.  As of March 2015, she is working full-time hours at the [employer] center. She's primary caregiver for her and who is currently 88 and lives in [place].  Her daughter Cassandra Nichols lives in [place] and is Mudlogger of the epidemiology and [place] there; she is married with 2 children. Son  Cassandra Nichols lives in [place], [place]. He has 4 children of his own.His wife is a Marine scientist  ADVANCED DIRECTIVES: in place, but the patient has not been able to find the documents. At the 08/15/2014 visit she was given the appropriate living will and health Department to you documents to complete and notarize at her discretion.  HEALTH MAINTENANCE:  (Updated  03/08/2014) History  Substance Use Topics  . Smoking status: Former Research scientist (life sciences)  . Smokeless tobacco: Never Used  . Alcohol Use: No     Comment: Occasionally     Colonoscopy: Not on  file  PAP:   Bone density: Not on file  Lipid panel: Not on file  Mammogram:  Feb 2103, [facility]   Allergies  Allergen Reactions  . Penicillins Rash    Current Outpatient Prescriptions  Medication Sig Dispense Refill  . Alum & Mag Hydroxide-Simeth (MAGIC MOUTHWASH W/LIDOCAINE) SOLN Take 5 mLs by mouth 4 (four) times daily as needed. (Patient not taking: Reported on 02/21/2015) 240 mL 2  . dexamethasone (DECADRON) 4 MG tablet Take 4 mg by mouth as needed.    . hydrochlorothiazide  (MICROZIDE) 12.5 MG capsule TAKE 1 CAPSULE (12.5 MG TOTAL) BY MOUTH DAILY. 30 capsule 0  . lisinopril (PRINIVIL,ZESTRIL) 20 MG tablet TAKE 1 TABLET (20 MG TOTAL) BY MOUTH DAILY. 30 tablet 1  . loratadine (CLARITIN) 10 MG tablet Take 10 mg by mouth daily as needed for allergies.    . Melatonin 3 MG TABS Take 3 mg by mouth at bedtime as needed.    . [DISCONTINUED] prochlorperazine (COMPAZINE) 10 MG tablet Take 1 tablet (10 mg total) by mouth every 6 (six) hours as needed (Nausea or vomiting). 30 tablet 1   No current facility-administered medications for this visit.   Facility-Administered Medications Ordered in Other Visits  Medication Dose Route Frequency Provider Last Rate Last Dose  . sodium chloride 0.9 % injection 10 mL  10 mL Intravenous PRN Cassandra Cruel, MD   10 mL at 05/24/14 1601    OBJECTIVE: Middle-aged white woman who appears stated age 74 Vitals:   07/02/15 1042  BP: 151/87  Pulse: 79  Temp: 97.9 F (36.6 C)  Resp: 18     Body mass index is 24.1 kg/(m^2).    ECOG FS: 0  Sclerae unicteric, EOMs intact Oropharynx clear, dentition in good repair No cervical or supraclavicular adenopathy Lungs no rales or rhonchi Heart regular rate and rhythm Abd soft, nontender, positive bowel sounds MSK no focal spinal tenderness, no upper extremity lymphedema Neuro: nonfocal, well oriented, appropriate affect Breasts: Deferred, no masses palpated   LAB RESULTS:  Lab Results  Component Value Date   WBC 3.0* 07/02/2015   NEUTROABS 2.2 07/02/2015   HGB 12.0 07/02/2015   HCT 36.4 07/02/2015   MCV 97.5 07/02/2015   PLT 93* 07/02/2015        Chemistry      Component Value Date/Time   NA 140 06/06/2015 1443   NA 140 07/04/2012 0832   K 3.8 06/06/2015 1443   K 4.2 07/04/2012 0832   CL 106 05/09/2013 1304   CL 106 07/04/2012 0832   CO2 28 06/06/2015 1443   CO2 26 07/04/2012 0832   BUN 22.2 06/06/2015 1443   BUN 17 07/04/2012 0832   CREATININE 0.9 06/06/2015 1443    CREATININE 0.82 07/04/2012 0832      Component Value Date/Time   CALCIUM 9.3 06/06/2015 1443   CALCIUM 9.2 07/04/2012 0832   ALKPHOS 91 06/06/2015 1443   ALKPHOS 71 07/04/2012 0832   AST 22 06/06/2015 1443   AST 18 07/04/2012 0832   ALT 14 06/06/2015 1443   ALT 12 07/04/2012 0832   BILITOT 0.48 06/06/2015 1443   BILITOT 0.7 07/04/2012 0832     Results for NANETTA, WIEGMAN (MRN [id number]) as of 07/02/2015 10:51  Ref. Range 02/05/2015 13:17 02/21/2015 13:09 03/21/2015 14:41 04/04/2015 15:03 06/06/2015 14:43  CA 125 Latest Ref Range: <35 U/mL 2614 (H) 2502 (H) 2540 (H) 2808 (H) 3100 (H)   STUDIES: Ct Abdomen Pelvis W Contrast  06/19/2015  CLINICAL DATA:  Ovarian cancer with recurrence, chemotherapy and progress.  EXAM: CT ABDOMEN AND PELVIS WITH CONTRAST  TECHNIQUE: Multidetector CT imaging of the abdomen and pelvis was performed using the standard protocol following bolus administration of intravenous contrast.  CONTRAST:  180mL OMNIPAQUE IOHEXOL 300 MG/ML  SOLN  COMPARISON:  11/22/2014.  FINDINGS: Lower chest: Lung bases show no acute findings. Heart size normal. No pericardial or pleural effusion.  Hepatobiliary: Low-attenuation lesion in segment 4 of the liver measures 1.2 x 1.9 cm (series 5, image 6), stable. There is a transient hepatic attenuation difference and mild intrahepatic biliary ductal dilatation distal to the lesion. Liver and gallbladder are otherwise unremarkable. No extrahepatic biliary ductal dilatation.  Pancreas: Negative.  Spleen: Splenule, negative.  Adrenals/Urinary Tract: Adrenal glands are unremarkable. 3 mm low-attenuation lesion in the lower pole right kidney is too small to characterize. Kidneys are otherwise unremarkable. Ureters are decompressed. Bladder is low in volume.  Stomach/Bowel: Stomach, small bowel and colon are unremarkable. Appendectomy.  Vascular/Lymphatic: Atherosclerotic calcification of the arterial vasculature without abdominal aortic aneurysm. Retroaortic  left renal vein. Scattered lymph node dissection clips in the retroperitoneum. Porta hepatis lymph node is heterogeneous, measuring 2.5 cm (series 2, image 14), stable when remeasured on the prior exam. Retroperitoneal lymph nodes measure up to 9 mm in the left periaortic station, stable.  Reproductive: Hysterectomy and bilateral oophorectomy.  Other: Soft tissue deposits are seen in the posterior anatomic pelvis, measuring up to 2.0 cm in the presacral space (series 2, image 48), stable. No free fluid. Mesenteries and peritoneum are otherwise unremarkable.  Musculoskeletal: Sclerotic lesion in the left iliac wing is unchanged and may represent a bone island. Otherwise, no worrisome lytic or sclerotic lesions. Degenerative changes are seen in the spine. L3 compression deformity is unchanged. There may be compression deformity versus prominent Schmorl's node along the inferior endplate of L5, also stable.  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   Electronically Signed   By: Cassandra Nichols M.D.   On: 06/19/2015 08:26     ASSESSMENT: 74 y.o.  [place] woman (1)  with a history of ovarian cancer dating back to July 2002 when she underwent debulking surgery for what proved to be 4/18 lymph node positive disease.   (2)  She received 6 cycles of paclitaxel/carboplatin adjuvantly completed in December 2002.   (3)  She had her first recurrence in December 2005 treated with paclitaxel;   (4)  second recurrence May 2007 treated with [place] and paclitaxel;   (5)  third recurrence January 2010 treated with topotecan and Gemzar;   (6)  fourth recurrence January 2012 treated with single-agent carboplatin (January to August 2012) with initial response, then plateau; with progression January 2013 (less than 6 months after last carbo dose) treated with single-agent gemzar given Q2 weeks (January to April 2013), with evidence of progression  (7) Doxil strartedJuly 2013 6, with evidence of  initial response, and fair tolerance. Held between May and August 2014 due to patient's work schedule and scheduling issues.  Resumed treatment as of 08/24/2013, with some delays secondary to the patient's schedule. As of early December 2014, her total cumulative dose of Doxil reached 560 mg per meter square. In addition there was also an upward trend of the CA 125. Doxil was discontinued (last dose 10/30/2013). Echocardiogram on 11/14/2013 showed a well preserved ejection fraction of 55-60%.  (8)  treated with single agent paclitaxel, given on days one and 8 of each 21 day cycle, first  dose on 12/27/2013,with continuing response, but mild to moderate toxicity; 11th and last dose 04/12/2014   (9) carboplatin and cyclophosphamide started on 08/28/14 given every 21 days-- stopped 10/10/2014 after a reaction to carboplatin (also no evidence of response)  (10) cyclophosphamide and bevacizumab started 12/02/2014, repeated every 21 days  (a) CT scan of the abdomen and pelvis 06/19/2015 shows stable disease  PLAN:  Tymeka Privette is tolerating the treatment well, and it is controlling her tumor. She maintains an excellent functional status for her, and was able to work full-time and even over time this summer. She is planning trips to [place] and has been traveling to visit friends here in [place] as well.  She is distraught because of financial issues. I have asked her to meet with our financial advisor to discuss that further.  Otherwise I am concerned that she has a very irregular schedule and does not get her treatments on time. We are going ahead and setting her up for therapy this Friday, after she missed treatment last Friday, but I suggested we go ahead and schedule her next 6 treatments and then only modify as needed. In the past she has made plans to be out of town, canceled the treatment, not gone out of town, and not rescheduled, and the result is again very irregular treatment pattern, my concern  of course being that if and when we developed disease progression there are not good to me that many great choices.  She will be treated this Friday and then every 21 days. She will see [place] again in 6-9 weeks and then she will see me again in 6-9 weeks after that. If she'll let us know a little bit in advance when she needs to modify a treatment date we will get that accomplished for her   Cassandra Cruel, MD     07/02/2015

## 2015-07-03 LAB — CA 125: CA 125: 3236 U/mL — AB (ref <35)

## 2015-07-04 ENCOUNTER — Ambulatory Visit (HOSPITAL_BASED_OUTPATIENT_CLINIC_OR_DEPARTMENT_OTHER): Payer: Medicare Other

## 2015-07-04 VITALS — BP 152/84 | HR 74 | Temp 97.8°F | Resp 17

## 2015-07-04 DIAGNOSIS — C569 Malignant neoplasm of unspecified ovary: Secondary | ICD-10-CM | POA: Diagnosis not present

## 2015-07-04 DIAGNOSIS — Z5111 Encounter for antineoplastic chemotherapy: Secondary | ICD-10-CM | POA: Diagnosis not present

## 2015-07-04 DIAGNOSIS — Z5112 Encounter for antineoplastic immunotherapy: Secondary | ICD-10-CM

## 2015-07-04 MED ORDER — SODIUM CHLORIDE 0.9 % IV SOLN
Freq: Once | INTRAVENOUS | Status: AC
  Administered 2015-07-04: 15:00:00 via INTRAVENOUS

## 2015-07-04 MED ORDER — HEPARIN SOD (PORK) LOCK FLUSH 100 UNIT/ML IV SOLN
500.0000 [IU] | Freq: Once | INTRAVENOUS | Status: AC | PRN
  Administered 2015-07-04: 500 [IU]
  Filled 2015-07-04: qty 5

## 2015-07-04 MED ORDER — SODIUM CHLORIDE 0.9 % IJ SOLN
10.0000 mL | INTRAMUSCULAR | Status: DC | PRN
  Administered 2015-07-04: 10 mL
  Filled 2015-07-04: qty 10

## 2015-07-04 MED ORDER — SODIUM CHLORIDE 0.9 % IV SOLN
800.0000 mg | Freq: Once | INTRAVENOUS | Status: AC
  Administered 2015-07-04: 800 mg via INTRAVENOUS
  Filled 2015-07-04: qty 32

## 2015-07-04 MED ORDER — SODIUM CHLORIDE 0.9 % IV SOLN
Freq: Once | INTRAVENOUS | Status: AC
  Administered 2015-07-04: 15:00:00 via INTRAVENOUS
  Filled 2015-07-04: qty 4

## 2015-07-04 MED ORDER — SODIUM CHLORIDE 0.9 % IV SOLN
600.0000 mg/m2 | Freq: Once | INTRAVENOUS | Status: AC
  Administered 2015-07-04: 920 mg via INTRAVENOUS
  Filled 2015-07-04: qty 46

## 2015-07-04 NOTE — Patient Instructions (Signed)
[facility] Discharge Instructions for Patients Receiving Chemotherapy  Today you received the following chemotherapy agents Avastin/Cytoxan.  To help prevent nausea and vomiting after your treatment, we encourage you to take your nausea medication as directed.   If you develop nausea and vomiting that is not controlled by your nausea medication, call the clinic.   BELOW ARE SYMPTOMS THAT SHOULD BE REPORTED IMMEDIATELY:  *FEVER GREATER THAN 100.5 F  *CHILLS WITH OR WITHOUT FEVER  NAUSEA AND VOMITING THAT IS NOT CONTROLLED WITH YOUR NAUSEA MEDICATION  *UNUSUAL SHORTNESS OF BREATH  *UNUSUAL BRUISING OR BLEEDING  TENDERNESS IN MOUTH AND THROAT WITH OR WITHOUT PRESENCE OF ULCERS  *URINARY PROBLEMS  *BOWEL PROBLEMS  UNUSUAL RASH Items with * indicate a potential emergency and should be followed up as soon as possible.  Feel free to call the clinic you have any questions or concerns. The clinic phone number is [phone].  Please show the [place] at check-in to the Emergency Department and triage nurse.

## 2015-07-09 ENCOUNTER — Telehealth: Payer: Self-pay | Admitting: Oncology

## 2015-07-09 NOTE — Telephone Encounter (Signed)
Per 7/14 staff message from [place] he will see pt 10/28 @ 9:30 am. Lab/tx 10/28 moved accordingly. Left message on voicemail for patient re change and new time for lab/GM/tx 10/28 @ 9 am. Also confirmed next appointment for 8/5 and patient to get new schedule then.

## 2015-07-23 ENCOUNTER — Telehealth: Payer: Self-pay | Admitting: Oncology

## 2015-07-23 NOTE — Telephone Encounter (Signed)
Called and left a message with 8/8 lab and chemo per patient request to reschedule from 8/5

## 2015-07-25 ENCOUNTER — Ambulatory Visit: Payer: Medicare Other

## 2015-07-25 ENCOUNTER — Other Ambulatory Visit: Payer: Medicare Other

## 2015-07-28 ENCOUNTER — Other Ambulatory Visit (HOSPITAL_BASED_OUTPATIENT_CLINIC_OR_DEPARTMENT_OTHER): Payer: Medicare Other

## 2015-07-28 ENCOUNTER — Ambulatory Visit (HOSPITAL_BASED_OUTPATIENT_CLINIC_OR_DEPARTMENT_OTHER): Payer: Medicare Other

## 2015-07-28 VITALS — BP 148/74 | HR 80 | Temp 98.2°F | Resp 17

## 2015-07-28 DIAGNOSIS — Z5111 Encounter for antineoplastic chemotherapy: Secondary | ICD-10-CM

## 2015-07-28 DIAGNOSIS — Z5112 Encounter for antineoplastic immunotherapy: Secondary | ICD-10-CM | POA: Diagnosis not present

## 2015-07-28 DIAGNOSIS — C569 Malignant neoplasm of unspecified ovary: Secondary | ICD-10-CM | POA: Diagnosis not present

## 2015-07-28 LAB — COMPREHENSIVE METABOLIC PANEL (CC13)
ALBUMIN: 3.4 g/dL — AB (ref 3.5–5.0)
ALK PHOS: 81 U/L (ref 40–150)
ALT: 17 U/L (ref 0–55)
AST: 23 U/L (ref 5–34)
Anion Gap: 7 mEq/L (ref 3–11)
BILIRUBIN TOTAL: 0.46 mg/dL (ref 0.20–1.20)
BUN: 17.1 mg/dL (ref 7.0–26.0)
CO2: 27 meq/L (ref 22–29)
Calcium: 9.5 mg/dL (ref 8.4–10.4)
Chloride: 108 mEq/L (ref 98–109)
Creatinine: 0.9 mg/dL (ref 0.6–1.1)
EGFR: 60 mL/min/{1.73_m2} — ABNORMAL LOW (ref >90)
Glucose: 129 mg/dl (ref 70–140)
Potassium: 4.2 mEq/L (ref 3.5–5.1)
Sodium: 142 mEq/L (ref 136–145)
Total Protein: 6.2 g/dL — ABNORMAL LOW (ref 6.4–8.3)

## 2015-07-28 LAB — CBC WITH DIFFERENTIAL/PLATELET
BASO%: 0.5 % (ref 0.0–2.0)
Basophils Absolute: 0 10*3/uL (ref 0.0–0.1)
EOS%: 0.8 % (ref 0.0–7.0)
Eosinophils Absolute: 0 10*3/uL (ref 0.0–0.5)
HCT: 32.5 % — ABNORMAL LOW (ref 34.8–46.6)
HGB: 11 g/dL — ABNORMAL LOW (ref 11.6–15.9)
LYMPH%: 22.4 % (ref 14.0–49.7)
MCH: 32.6 pg (ref 25.1–34.0)
MCHC: 33.8 g/dL (ref 31.5–36.0)
MCV: 96.4 fL (ref 79.5–101.0)
MONO#: 0.5 10*3/uL (ref 0.1–0.9)
MONO%: 13.4 % (ref 0.0–14.0)
NEUT#: 2.3 10*3/uL (ref 1.5–6.5)
NEUT%: 62.9 % (ref 38.4–76.8)
PLATELETS: 80 10*3/uL — AB (ref 145–400)
RBC: 3.37 10*6/uL — ABNORMAL LOW (ref 3.70–5.45)
RDW: 15.1 % — ABNORMAL HIGH (ref 11.2–14.5)
WBC: 3.7 10*3/uL — AB (ref 3.9–10.3)
lymph#: 0.8 10*3/uL — ABNORMAL LOW (ref 0.9–3.3)

## 2015-07-28 LAB — UA PROTEIN, DIPSTICK - CHCC: Protein, ur: NEGATIVE mg/dL

## 2015-07-28 MED ORDER — HEPARIN SOD (PORK) LOCK FLUSH 100 UNIT/ML IV SOLN
500.0000 [IU] | Freq: Once | INTRAVENOUS | Status: AC | PRN
  Administered 2015-07-28: 500 [IU]
  Filled 2015-07-28: qty 5

## 2015-07-28 MED ORDER — SODIUM CHLORIDE 0.9 % IV SOLN
Freq: Once | INTRAVENOUS | Status: AC
  Administered 2015-07-28: 15:00:00 via INTRAVENOUS

## 2015-07-28 MED ORDER — SODIUM CHLORIDE 0.9 % IJ SOLN
10.0000 mL | INTRAMUSCULAR | Status: DC | PRN
  Administered 2015-07-28: 10 mL
  Filled 2015-07-28: qty 10

## 2015-07-28 MED ORDER — SODIUM CHLORIDE 0.9 % IV SOLN
600.0000 mg/m2 | Freq: Once | INTRAVENOUS | Status: AC
  Administered 2015-07-28: 920 mg via INTRAVENOUS
  Filled 2015-07-28: qty 46

## 2015-07-28 MED ORDER — SODIUM CHLORIDE 0.9 % IV SOLN
Freq: Once | INTRAVENOUS | Status: AC
  Administered 2015-07-28: 16:00:00 via INTRAVENOUS
  Filled 2015-07-28: qty 4

## 2015-07-28 MED ORDER — SODIUM CHLORIDE 0.9 % IV SOLN
14.5000 mg/kg | Freq: Once | INTRAVENOUS | Status: AC
  Administered 2015-07-28: 800 mg via INTRAVENOUS
  Filled 2015-07-28: qty 32

## 2015-07-28 NOTE — Patient Instructions (Signed)
[facility] Discharge Instructions for Patients Receiving Chemotherapy  Today you received the following chemotherapy agents Avastin/Cytoxan.  To help prevent nausea and vomiting after your treatment, we encourage you to take your nausea medication as directed.   If you develop nausea and vomiting that is not controlled by your nausea medication, call the clinic.   BELOW ARE SYMPTOMS THAT SHOULD BE REPORTED IMMEDIATELY:  *FEVER GREATER THAN 100.5 F  *CHILLS WITH OR WITHOUT FEVER  NAUSEA AND VOMITING THAT IS NOT CONTROLLED WITH YOUR NAUSEA MEDICATION  *UNUSUAL SHORTNESS OF BREATH  *UNUSUAL BRUISING OR BLEEDING  TENDERNESS IN MOUTH AND THROAT WITH OR WITHOUT PRESENCE OF ULCERS  *URINARY PROBLEMS  *BOWEL PROBLEMS  UNUSUAL RASH Items with * indicate a potential emergency and should be followed up as soon as possible.  Feel free to call the clinic you have any questions or concerns. The clinic phone number is [phone].  Please show the [facility] at check-in to the Emergency Department and triage nurse.

## 2015-07-28 NOTE — Progress Notes (Signed)
To treat with today's CBC. Dr Irene Limbo

## 2015-07-29 ENCOUNTER — Other Ambulatory Visit: Payer: Self-pay | Admitting: Oncology

## 2015-08-13 ENCOUNTER — Other Ambulatory Visit: Payer: Self-pay | Admitting: Oncology

## 2015-08-13 NOTE — Telephone Encounter (Signed)
Cassandra Nichols left message requesting a refill for her lisinopril.  She states she is currently in [place] visiting daughter ( and grandsons ).  Pt left local [facility] number of [phone]  This RN called prescription to above as well as refilled per EPIC to local pharmacy.  Per VM this RN returned call to Bryann Gentz at given number for her daughter at [phone].  Obtained VM- message left per above for Cassandra Nichols as well as noted appointment for this office on 8/26- and requested return call regarding if that appointment needs to be rescheduled.

## 2015-08-14 ENCOUNTER — Telehealth: Payer: Self-pay | Admitting: *Deleted

## 2015-08-14 NOTE — Telephone Encounter (Signed)
Received phone call off of triage voicemail. Patient request med refill to be send to her daughter's pharmacy [place]. Message given to desk RN.

## 2015-08-15 ENCOUNTER — Other Ambulatory Visit: Payer: Self-pay | Admitting: *Deleted

## 2015-08-15 ENCOUNTER — Other Ambulatory Visit: Payer: Self-pay | Admitting: Nurse Practitioner

## 2015-08-15 ENCOUNTER — Ambulatory Visit: Payer: Medicare Other | Admitting: Nurse Practitioner

## 2015-08-15 ENCOUNTER — Ambulatory Visit: Payer: Medicare Other

## 2015-08-15 ENCOUNTER — Telehealth: Payer: Self-pay | Admitting: Nurse Practitioner

## 2015-08-15 ENCOUNTER — Other Ambulatory Visit: Payer: Medicare Other

## 2015-08-15 NOTE — Telephone Encounter (Signed)
Called and left a message with new appointments per pof

## 2015-08-21 ENCOUNTER — Telehealth: Payer: Self-pay | Admitting: Oncology

## 2015-08-21 ENCOUNTER — Other Ambulatory Visit: Payer: Self-pay

## 2015-08-21 ENCOUNTER — Other Ambulatory Visit: Payer: Self-pay | Admitting: Nurse Practitioner

## 2015-08-21 NOTE — Telephone Encounter (Signed)
Called and left a messagae with new appointment times for 9/9

## 2015-08-22 ENCOUNTER — Other Ambulatory Visit: Payer: Medicare Other

## 2015-08-22 ENCOUNTER — Ambulatory Visit: Payer: Medicare Other

## 2015-08-22 ENCOUNTER — Ambulatory Visit: Payer: Medicare Other | Admitting: Nurse Practitioner

## 2015-08-26 ENCOUNTER — Telehealth: Payer: Self-pay | Admitting: *Deleted

## 2015-08-26 NOTE — Telephone Encounter (Signed)
Patient called reporting she "missed the September 2 appointment because she was in [place] trapped with [facility].  I can come in today at 1:30 or tomorrow at 1:30."   Return number [phone].  Notified APP of this request.  Appointment has already been rescheduled to this Friday 08-29-2015.  Tried to call patient x three and will continue to notify her of appointments.

## 2015-08-27 NOTE — Telephone Encounter (Signed)
Called patient today. Voicemail left with appointment information.  Missed appointment was rescheduled last week.  08-29-2015 beginning at 2:15 pm is information left on voicemail was confirmed with Nira Conn on yesterday.  Unable to reach her yesterday due to [facility] phone trouble calling out.

## 2015-08-29 ENCOUNTER — Other Ambulatory Visit (HOSPITAL_BASED_OUTPATIENT_CLINIC_OR_DEPARTMENT_OTHER): Payer: Medicare Other

## 2015-08-29 ENCOUNTER — Telehealth: Payer: Self-pay | Admitting: Oncology

## 2015-08-29 ENCOUNTER — Encounter: Payer: Self-pay | Admitting: Physician Assistant

## 2015-08-29 ENCOUNTER — Ambulatory Visit (HOSPITAL_BASED_OUTPATIENT_CLINIC_OR_DEPARTMENT_OTHER): Payer: Medicare Other | Admitting: Physician Assistant

## 2015-08-29 ENCOUNTER — Other Ambulatory Visit: Payer: Self-pay | Admitting: *Deleted

## 2015-08-29 ENCOUNTER — Other Ambulatory Visit: Payer: Self-pay | Admitting: Hematology & Oncology

## 2015-08-29 ENCOUNTER — Other Ambulatory Visit: Payer: Self-pay | Admitting: Nurse Practitioner

## 2015-08-29 ENCOUNTER — Ambulatory Visit (HOSPITAL_BASED_OUTPATIENT_CLINIC_OR_DEPARTMENT_OTHER): Payer: Medicare Other

## 2015-08-29 VITALS — BP 152/70 | HR 90 | Temp 98.3°F | Resp 18 | Ht 59.0 in | Wt 119.9 lb

## 2015-08-29 DIAGNOSIS — Z5112 Encounter for antineoplastic immunotherapy: Secondary | ICD-10-CM

## 2015-08-29 DIAGNOSIS — C569 Malignant neoplasm of unspecified ovary: Secondary | ICD-10-CM | POA: Diagnosis not present

## 2015-08-29 LAB — CBC WITH DIFFERENTIAL/PLATELET
BASO%: 1 % (ref 0.0–2.0)
Basophils Absolute: 0 10*3/uL (ref 0.0–0.1)
EOS%: 0.6 % (ref 0.0–7.0)
Eosinophils Absolute: 0 10*3/uL (ref 0.0–0.5)
HEMATOCRIT: 35.7 % (ref 34.8–46.6)
HEMOGLOBIN: 11.7 g/dL (ref 11.6–15.9)
LYMPH#: 0.8 10*3/uL — AB (ref 0.9–3.3)
LYMPH%: 19.5 % (ref 14.0–49.7)
MCH: 32 pg (ref 25.1–34.0)
MCHC: 32.9 g/dL (ref 31.5–36.0)
MCV: 97.5 fL (ref 79.5–101.0)
MONO#: 0.5 10*3/uL (ref 0.1–0.9)
MONO%: 11.3 % (ref 0.0–14.0)
NEUT%: 67.6 % (ref 38.4–76.8)
NEUTROS ABS: 2.9 10*3/uL (ref 1.5–6.5)
Platelets: 102 10*3/uL — ABNORMAL LOW (ref 145–400)
RBC: 3.67 10*6/uL — ABNORMAL LOW (ref 3.70–5.45)
RDW: 16 % — ABNORMAL HIGH (ref 11.2–14.5)
WBC: 4.3 10*3/uL (ref 3.9–10.3)

## 2015-08-29 LAB — UA PROTEIN, DIPSTICK - CHCC: Protein, ur: NEGATIVE mg/dL

## 2015-08-29 LAB — COMPREHENSIVE METABOLIC PANEL (CC13)
ALBUMIN: 3.7 g/dL (ref 3.5–5.0)
ALK PHOS: 88 U/L (ref 40–150)
ALT: 18 U/L (ref 0–55)
AST: 26 U/L (ref 5–34)
Anion Gap: 8 mEq/L (ref 3–11)
BUN: 25.5 mg/dL (ref 7.0–26.0)
CO2: 25 mEq/L (ref 22–29)
CREATININE: 1.1 mg/dL (ref 0.6–1.1)
Calcium: 9.7 mg/dL (ref 8.4–10.4)
Chloride: 106 mEq/L (ref 98–109)
EGFR: 52 mL/min/{1.73_m2} — ABNORMAL LOW (ref >90)
GLUCOSE: 100 mg/dL (ref 70–140)
Potassium: 4.6 mEq/L (ref 3.5–5.1)
SODIUM: 139 meq/L (ref 136–145)
Total Bilirubin: 0.74 mg/dL (ref 0.20–1.20)
Total Protein: 6.6 g/dL (ref 6.4–8.3)

## 2015-08-29 MED ORDER — SODIUM CHLORIDE 0.9 % IV SOLN
600.0000 mg/m2 | Freq: Once | INTRAVENOUS | Status: AC
  Administered 2015-08-29: 920 mg via INTRAVENOUS
  Filled 2015-08-29: qty 46

## 2015-08-29 MED ORDER — SODIUM CHLORIDE 0.9 % IJ SOLN
10.0000 mL | INTRAMUSCULAR | Status: DC | PRN
  Administered 2015-08-29: 10 mL
  Filled 2015-08-29: qty 10

## 2015-08-29 MED ORDER — HYDROCHLOROTHIAZIDE 12.5 MG PO CAPS: ORAL_CAPSULE | ORAL | Status: DC

## 2015-08-29 MED ORDER — SODIUM CHLORIDE 0.9 % IV SOLN
Freq: Once | INTRAVENOUS | Status: AC
  Administered 2015-08-29: 16:00:00 via INTRAVENOUS

## 2015-08-29 MED ORDER — LISINOPRIL 20 MG PO TABS: 20.0000 mg | ORAL_TABLET | Freq: Every day | ORAL | Status: DC

## 2015-08-29 MED ORDER — HEPARIN SOD (PORK) LOCK FLUSH 100 UNIT/ML IV SOLN
500.0000 [IU] | Freq: Once | INTRAVENOUS | Status: AC | PRN
  Administered 2015-08-29: 500 [IU]
  Filled 2015-08-29: qty 5

## 2015-08-29 MED ORDER — SODIUM CHLORIDE 0.9 % IV SOLN
Freq: Once | INTRAVENOUS | Status: AC
  Administered 2015-08-29: 16:00:00 via INTRAVENOUS
  Filled 2015-08-29: qty 4

## 2015-08-29 MED ORDER — SODIUM CHLORIDE 0.9 % IV SOLN
14.7000 mg/kg | Freq: Once | INTRAVENOUS | Status: AC
  Administered 2015-08-29: 800 mg via INTRAVENOUS
  Filled 2015-08-29: qty 32

## 2015-08-29 NOTE — Progress Notes (Signed)
ID: Cassandra Nichols   DOB: 23-Aug-1941  MR#: [id number]  BSJ#:[id number]   PCP:  Cassandra Smolder, MD GYN:  Cassandra Laroche, MD OTHER:  Cassandra Peach, MD  CHIEF COMPLAINT:  Recurrent Ovarian Cancer  CURRENT TREATMENT:  cyclophosphamide and carboplatin  NOTE: THIS PATIENT REQUESTS THAT ABSOLUTELY NO NUMERIC INFORMATION BE GIVEN REGARDING ANY LAB AND PARTICULARLY DOES NOT WANT TO KNOW SPECIFIC CA-125 RESULTS  HISTORY OF PRESENT ILLNESS: Cassandra Nichols was initially diagnosed with ovarian cancer in July of 2002. She underwent debulking surgery at that time for what proved to be a 4 of 18 lymph nodes positive disease. She was initially treated with 6 cycles of paclitaxel and carboplatin adjuvantly, completed in December of 2002.  Patient has had multiple recurrences since December of 2002, and has been treated with multiple agents as noted below.  INTERVAL HISTORY: Cassandra Nichols returns today for follow up of her ovarian cancer. She  Presents for her scheduled cycle of cyclophosphamide and bevacizumab. She ports continued referral are the symptoms affecting her feet which she feels may be a little worse. She reports that she gets laryngitis after her treatments that usually resolves within 48 hours. She voiced no other complaints today.   REVIEW OF SYSTEMS: Cassandra Nichols continues to deny abdominal distention or discomfort, denies difficulty with bowel movements or urine, denies jaundice, and also she has had no bleeding except for one very minimal episode of epistaxis a few weeks ago. She continues to attempt to stay positive. A detailed review of systems today was otherwise stable  PAST MEDICAL HISTORY: Past Medical History  Diagnosis Date  . Shingles   . 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 Ovarian cancer 11/17/2011  . Cancer     PAST SURGICAL HISTORY: Past Surgical History  Procedure Laterality Date  . Ganglion cyst excision Left     Wrist    FAMILY HISTORY Family History  Problem Relation Age of Onset   . Hypertension Mother   . GER disease Mother   . Cancer Father     Cancer (stomach)    GYNECOLOGIC HISTORY: GXP2  SOCIAL HISTORY: (Updated 03/08/2014) Cassandra Nichols lives alone. She works temporary jobs, sometimes as a Oceanographer, and sometimes grading tests.  As of March 2015, she is working full-time hours at the [employer] center. She's primary caregiver for her and who is currently 34 and lives in [place].  Her daughter Cassandra Nichols lives in [place] and is Mudlogger of the epidemiology and Ridge there; she is married with 2 children. Son  Cassandra Nichols lives in [place], [place]. He has 4 children of his own.His wife is a Marine scientist  ADVANCED DIRECTIVES: in place, but the patient has not been able to find the documents. At the 08/15/2014 visit she was given the appropriate living will and health Department to you documents to complete and notarize at her discretion.  HEALTH MAINTENANCE:  (Updated  03/08/2014) Social History  Substance Use Topics  . Smoking status: Former Research scientist (life sciences)  . Smokeless tobacco: Never Used  . Alcohol Use: No     Comment: Occasionally     Colonoscopy: Not on file  PAP:   Bone density: Not on file  Lipid panel: Not on file  Mammogram:  Feb 2103, [facility]   Allergies  Allergen Reactions  . Penicillins Rash    Current Outpatient Prescriptions  Medication Sig Dispense Refill  . Alum & Mag Hydroxide-Simeth (MAGIC MOUTHWASH W/LIDOCAINE) SOLN Take 5 mLs by mouth 4 (four) times daily as needed. (Patient  not taking: Reported on 02/21/2015) 240 mL 2  . dexamethasone (DECADRON) 4 MG tablet Take 4 mg by mouth as needed.    . hydrochlorothiazide (MICROZIDE) 12.5 MG capsule TAKE 1 CAPSULE (12.5 MG TOTAL) BY MOUTH DAILY. 30 capsule 0  . lisinopril (PRINIVIL,ZESTRIL) 20 MG tablet TAKE 1 TABLET BY MOUTH ONCE DAILY 30 tablet 3  . loratadine (CLARITIN) 10 MG tablet Take 10 mg by mouth daily as needed for allergies.    . Melatonin 3 MG TABS Take 3 mg by mouth at bedtime as needed.    .  [DISCONTINUED] prochlorperazine (COMPAZINE) 10 MG tablet Take 1 tablet (10 mg total) by mouth every 6 (six) hours as needed (Nausea or vomiting). 30 tablet 1   No current facility-administered medications for this visit.   Facility-Administered Medications Ordered in Other Visits  Medication Dose Route Frequency Provider Last Rate Last Dose  . sodium chloride 0.9 % injection 10 mL  10 mL Intravenous PRN Cassandra Cruel, MD   10 mL at 05/24/14 1601    OBJECTIVE: Middle-aged white woman who appears stated age 74 Vitals:   08/29/15 1449  BP: 152/70  Pulse: 90  Temp: 98.3 F (36.8 C)  Resp: 18     Body mass index is 24.2 kg/(m^2).    ECOG FS: 0  Sclerae unicteric, EOMs intact Oropharynx clear, dentition in good repair No cervical or supraclavicular adenopathy Lungs no rales or rhonchi Heart regular rate and rhythm Abd soft, nontender, positive bowel sounds MSK no focal spinal tenderness, no upper extremity lymphedema Neuro: nonfocal, well oriented, appropriate affect Breasts: Deferred, no masses palpated   LAB RESULTS:  Lab Results  Component Value Date   WBC 4.3 08/29/2015   NEUTROABS 2.9 08/29/2015   HGB 11.7 08/29/2015   HCT 35.7 08/29/2015   MCV 97.5 08/29/2015   PLT 102* 08/29/2015        Chemistry      Component Value Date/Time   NA 142 07/28/2015 1430   NA 140 07/04/2012 0832   K 4.2 07/28/2015 1430   K 4.2 07/04/2012 0832   CL 106 05/09/2013 1304   CL 106 07/04/2012 0832   CO2 27 07/28/2015 1430   CO2 26 07/04/2012 0832   BUN 17.1 07/28/2015 1430   BUN 17 07/04/2012 0832   CREATININE 0.9 07/28/2015 1430   CREATININE 0.82 07/04/2012 0832      Component Value Date/Time   CALCIUM 9.5 07/28/2015 1430   CALCIUM 9.2 07/04/2012 0832   ALKPHOS 81 07/28/2015 1430   ALKPHOS 71 07/04/2012 0832   AST 23 07/28/2015 1430   AST 18 07/04/2012 0832   ALT 17 07/28/2015 1430   ALT 12 07/04/2012 0832   BILITOT 0.46 07/28/2015 1430   BILITOT 0.7 07/04/2012  0832     Results for Cassandra Nichols (MRN [id number]) as of 07/02/2015 10:51  Ref. Range 02/05/2015 13:17 02/21/2015 13:09 03/21/2015 14:41 04/04/2015 15:03 06/06/2015 14:43  CA 125 Latest Ref Range: <35 U/mL 2614 (H) 2502 (H) 2540 (H) 2808 (H) 3100 (H)   STUDIES: No results found.   ASSESSMENT: 74 y.o.  Cassandra Nichols woman (1)  with a history of ovarian cancer dating back to July 2002 when she underwent debulking surgery for what proved to be 4/18 lymph node positive disease.   (2)  She received 6 cycles of paclitaxel/carboplatin adjuvantly completed in December 2002.   (3)  She had her first recurrence in December 2005 treated with paclitaxel;   (4)  second recurrence May  2007 treated with [place] and paclitaxel;   (5)  third recurrence January 2010 treated with topotecan and Gemzar;   (6)  fourth recurrence January 2012 treated with single-agent carboplatin (January to August 2012) with initial response, then plateau; with progression January 2013 (less than 6 months after last carbo dose) treated with single-agent gemzar given Q2 weeks (January to April 2013), with evidence of progression  (7) Doxil strartedJuly 2013 6, with evidence of initial response, and fair tolerance. Held between May and August 2014 due to patient's work schedule and scheduling issues.  Resumed treatment as of 08/24/2013, with some delays secondary to the patient's schedule. As of early December 2014, her total cumulative dose of Doxil reached 560 mg per meter square. In addition there was also an upward trend of the CA 125. Doxil was discontinued (last dose 10/30/2013). Echocardiogram on 11/14/2013 showed a well preserved ejection fraction of 55-60%.  (8)  treated with single agent paclitaxel, given on days one and 8 of each 21 day cycle, first dose on 12/27/2013,with continuing response, but mild to moderate toxicity; 11th and last dose 04/12/2014   (9) carboplatin and cyclophosphamide started on 08/28/14 given every 21 days--  stopped 10/10/2014 after a reaction to carboplatin (also no evidence of response)  (10) cyclophosphamide and bevacizumab started 12/02/2014, repeated every 21 days  (a) CT scan of the abdomen and pelvis 06/19/2015 shows stable disease  PLAN:  Cassandra Nichols is tolerating the treatment well, and it is controlling her tumor. She maintains an excellent functional status for her, and has been able to work full-time and even over time this summer.   She is distraught because of financial issues. I have asked her to meet with our financial advisor to discuss that further.  She will contiue with her treatments as scheduled. She will follow up with Dr. Jana Hakim in 2 weeks as previously scheduled.     ANNALYNN, CENTANNI, PA-C     08/29/2015

## 2015-08-29 NOTE — Telephone Encounter (Signed)
Gave and printed appt sched and avs for pt fo rSep adn OCT

## 2015-08-29 NOTE — Telephone Encounter (Signed)
Gave and printed appt sched and avs for pt for pt for Sept and OCT

## 2015-08-29 NOTE — Patient Instructions (Signed)
[facility] Discharge Instructions for Patients Receiving Chemotherapy  Today you received the following chemotherapy agents Avastin/Cytoxan.  To help prevent nausea and vomiting after your treatment, we encourage you to take your nausea medication as directed.   If you develop nausea and vomiting that is not controlled by your nausea medication, call the clinic.   BELOW ARE SYMPTOMS THAT SHOULD BE REPORTED IMMEDIATELY:  *FEVER GREATER THAN 100.5 F  *CHILLS WITH OR WITHOUT FEVER  NAUSEA AND VOMITING THAT IS NOT CONTROLLED WITH YOUR NAUSEA MEDICATION  *UNUSUAL SHORTNESS OF BREATH  *UNUSUAL BRUISING OR BLEEDING  TENDERNESS IN MOUTH AND THROAT WITH OR WITHOUT PRESENCE OF ULCERS  *URINARY PROBLEMS  *BOWEL PROBLEMS  UNUSUAL RASH Items with * indicate a potential emergency and should be followed up as soon as possible.  Feel free to call the clinic you have any questions or concerns. The clinic phone number is [phone].  Please show the CHEMO ALERT CARD at check-in to the Emergency Department and triage nurse.    

## 2015-08-30 LAB — CA 125: CA 125: 3215 U/mL — ABNORMAL HIGH (ref <35)

## 2015-09-04 NOTE — Patient Instructions (Signed)
Continue labs and chemotherapy as scheduled Follow up with Dr. Jana Hakim as scheduled in 2 weeks

## 2015-09-05 ENCOUNTER — Other Ambulatory Visit: Payer: Self-pay | Admitting: *Deleted

## 2015-09-05 ENCOUNTER — Other Ambulatory Visit: Payer: Medicare Other

## 2015-09-05 ENCOUNTER — Telehealth: Payer: Self-pay | Admitting: *Deleted

## 2015-09-05 ENCOUNTER — Ambulatory Visit: Payer: Medicare Other

## 2015-09-05 MED ORDER — CLINDAMYCIN HCL 300 MG PO CAPS: 300.0000 mg | ORAL_CAPSULE | Freq: Three times a day (TID) | ORAL | Status: DC

## 2015-09-05 NOTE — Telephone Encounter (Signed)
Cassandra Nichols states she has " swollen gums and jaw "- she sees dental clinic at [facility] but it not able to see them until Monday. Pt is requesting an antibiotic to cover her for the weekend until seen at dental clinic.  Obtained prescription per HB/NP.

## 2015-09-05 NOTE — Telephone Encounter (Signed)
PLEASE CALL PT. CONCERNING THE POSSIBLE NEED FOR AN ANTIBIOTIC.

## 2015-09-10 ENCOUNTER — Other Ambulatory Visit: Payer: Self-pay

## 2015-09-10 DIAGNOSIS — Z1231 Encounter for screening mammogram for malignant neoplasm of breast: Secondary | ICD-10-CM

## 2015-09-11 ENCOUNTER — Telehealth: Payer: Self-pay

## 2015-09-11 NOTE — Telephone Encounter (Signed)
Patient called requesting a call back regarding her scheduled CT scan.  Writer called her back and LVM.

## 2015-09-12 ENCOUNTER — Ambulatory Visit (HOSPITAL_COMMUNITY)
Admission: RE | Admit: 2015-09-12 | Discharge: 2015-09-12 | Disposition: A | Discharge status: Home / Self Care | Payer: Medicare Other | Source: Ambulatory Visit | Attending: Nurse Practitioner | Admitting: Nurse Practitioner

## 2015-09-12 DIAGNOSIS — K449 Diaphragmatic hernia without obstruction or gangrene: Secondary | ICD-10-CM | POA: Diagnosis not present

## 2015-09-12 DIAGNOSIS — M4856XA Collapsed vertebra, not elsewhere classified, lumbar region, initial encounter for fracture: Secondary | ICD-10-CM | POA: Diagnosis not present

## 2015-09-12 DIAGNOSIS — I700 Atherosclerosis of aorta: Secondary | ICD-10-CM | POA: Insufficient documentation

## 2015-09-12 DIAGNOSIS — C569 Malignant neoplasm of unspecified ovary: Secondary | ICD-10-CM | POA: Insufficient documentation

## 2015-09-12 DIAGNOSIS — Z90710 Acquired absence of both cervix and uterus: Secondary | ICD-10-CM | POA: Diagnosis not present

## 2015-09-12 DIAGNOSIS — Z90722 Acquired absence of ovaries, bilateral: Secondary | ICD-10-CM | POA: Insufficient documentation

## 2015-09-12 DIAGNOSIS — M799 Soft tissue disorder, unspecified: Secondary | ICD-10-CM | POA: Insufficient documentation

## 2015-09-12 MED ORDER — IOHEXOL 300 MG/ML  SOLN
100.0000 mL | Freq: Once | INTRAMUSCULAR | Status: AC | PRN
  Administered 2015-09-12: 100 mL via INTRAVENOUS

## 2015-09-15 ENCOUNTER — Ambulatory Visit
Admission: RE | Admit: 2015-09-15 | Discharge: 2015-09-15 | Disposition: A | Discharge status: Home / Self Care | Payer: Medicare Other | Source: Ambulatory Visit

## 2015-09-15 DIAGNOSIS — Z1231 Encounter for screening mammogram for malignant neoplasm of breast: Secondary | ICD-10-CM

## 2015-09-19 ENCOUNTER — Other Ambulatory Visit (HOSPITAL_BASED_OUTPATIENT_CLINIC_OR_DEPARTMENT_OTHER): Payer: Medicare Other

## 2015-09-19 ENCOUNTER — Ambulatory Visit: Payer: Medicare Other

## 2015-09-19 DIAGNOSIS — C569 Malignant neoplasm of unspecified ovary: Secondary | ICD-10-CM | POA: Diagnosis not present

## 2015-09-19 LAB — CBC WITH DIFFERENTIAL/PLATELET
BASO%: 0.4 % (ref 0.0–2.0)
Basophils Absolute: 0 10*3/uL (ref 0.0–0.1)
EOS%: 0.8 % (ref 0.0–7.0)
Eosinophils Absolute: 0 10*3/uL (ref 0.0–0.5)
HEMATOCRIT: 34 % — AB (ref 34.8–46.6)
HGB: 11.3 g/dL — ABNORMAL LOW (ref 11.6–15.9)
LYMPH#: 0.6 10*3/uL — AB (ref 0.9–3.3)
LYMPH%: 22.3 % (ref 14.0–49.7)
MCH: 32.3 pg (ref 25.1–34.0)
MCHC: 33.2 g/dL (ref 31.5–36.0)
MCV: 97.1 fL (ref 79.5–101.0)
MONO#: 0.3 10*3/uL (ref 0.1–0.9)
MONO%: 13 % (ref 0.0–14.0)
NEUT#: 1.6 10*3/uL (ref 1.5–6.5)
NEUT%: 63.5 % (ref 38.4–76.8)
Platelets: 61 10*3/uL — ABNORMAL LOW (ref 145–400)
RBC: 3.5 10*6/uL — AB (ref 3.70–5.45)
RDW: 15.6 % — AB (ref 11.2–14.5)
WBC: 2.5 10*3/uL — ABNORMAL LOW (ref 3.9–10.3)

## 2015-09-19 LAB — COMPREHENSIVE METABOLIC PANEL (CC13)
ALT: 18 U/L (ref 0–55)
AST: 26 U/L (ref 5–34)
Albumin: 3.7 g/dL (ref 3.5–5.0)
Alkaline Phosphatase: 78 U/L (ref 40–150)
Anion Gap: 7 mEq/L (ref 3–11)
BUN: 18.1 mg/dL (ref 7.0–26.0)
CALCIUM: 9.5 mg/dL (ref 8.4–10.4)
CHLORIDE: 106 meq/L (ref 98–109)
CO2: 27 meq/L (ref 22–29)
CREATININE: 0.9 mg/dL (ref 0.6–1.1)
EGFR: 61 mL/min/{1.73_m2} — ABNORMAL LOW (ref >90)
GLUCOSE: 113 mg/dL (ref 70–140)
Potassium: 4.1 mEq/L (ref 3.5–5.1)
SODIUM: 139 meq/L (ref 136–145)
Total Bilirubin: 0.61 mg/dL (ref 0.20–1.20)
Total Protein: 6.5 g/dL (ref 6.4–8.3)

## 2015-09-19 LAB — UA PROTEIN, DIPSTICK - CHCC: Protein, ur: NEGATIVE mg/dL

## 2015-09-19 NOTE — Progress Notes (Signed)
Pt reports that after the last treatment there was some nausea with two episodes of vomiting over the 2 days following treatment, but was resolving by that Sunday (the 2nd day post-treatment). Pt reports that she has had pain in her left hip extending down part of the left leg when walking and that Tylenol helps ease the pain. WBC 2.5 and PLTS 61 Gentry Fitz NP aware and treatment will be held today per [facility] NP. PT aware and educated to call clinic with any concerns, and to monitor hip/leg pain and call if pain worsens. Pt verbalizes understanding.

## 2015-09-26 ENCOUNTER — Ambulatory Visit: Payer: Medicare Other

## 2015-09-26 ENCOUNTER — Other Ambulatory Visit: Payer: Medicare Other

## 2015-10-10 ENCOUNTER — Other Ambulatory Visit (HOSPITAL_BASED_OUTPATIENT_CLINIC_OR_DEPARTMENT_OTHER): Payer: Medicare Other

## 2015-10-10 ENCOUNTER — Telehealth: Payer: Self-pay | Admitting: Oncology

## 2015-10-10 ENCOUNTER — Ambulatory Visit (HOSPITAL_BASED_OUTPATIENT_CLINIC_OR_DEPARTMENT_OTHER): Payer: Medicare Other | Admitting: Oncology

## 2015-10-10 ENCOUNTER — Ambulatory Visit (HOSPITAL_BASED_OUTPATIENT_CLINIC_OR_DEPARTMENT_OTHER): Payer: Medicare Other

## 2015-10-10 VITALS — BP 170/83 | HR 83 | Temp 98.0°F | Resp 20 | Ht 59.0 in | Wt 120.3 lb

## 2015-10-10 VITALS — BP 159/69 | HR 69

## 2015-10-10 DIAGNOSIS — C569 Malignant neoplasm of unspecified ovary: Secondary | ICD-10-CM

## 2015-10-10 DIAGNOSIS — R110 Nausea: Secondary | ICD-10-CM | POA: Diagnosis not present

## 2015-10-10 DIAGNOSIS — I10 Essential (primary) hypertension: Secondary | ICD-10-CM

## 2015-10-10 DIAGNOSIS — Z5111 Encounter for antineoplastic chemotherapy: Secondary | ICD-10-CM

## 2015-10-10 DIAGNOSIS — R490 Dysphonia: Secondary | ICD-10-CM | POA: Diagnosis not present

## 2015-10-10 DIAGNOSIS — Z5112 Encounter for antineoplastic immunotherapy: Secondary | ICD-10-CM

## 2015-10-10 LAB — COMPREHENSIVE METABOLIC PANEL (CC13)
ALT: 13 U/L (ref 0–55)
AST: 21 U/L (ref 5–34)
Albumin: 3.8 g/dL (ref 3.5–5.0)
Alkaline Phosphatase: 67 U/L (ref 40–150)
Anion Gap: 7 mEq/L (ref 3–11)
BILIRUBIN TOTAL: 0.75 mg/dL (ref 0.20–1.20)
BUN: 16.2 mg/dL (ref 7.0–26.0)
CHLORIDE: 103 meq/L (ref 98–109)
CO2: 25 meq/L (ref 22–29)
CREATININE: 0.9 mg/dL (ref 0.6–1.1)
Calcium: 9.6 mg/dL (ref 8.4–10.4)
EGFR: 63 mL/min/{1.73_m2} — ABNORMAL LOW (ref >90)
GLUCOSE: 100 mg/dL (ref 70–140)
Potassium: 4 mEq/L (ref 3.5–5.1)
SODIUM: 134 meq/L — AB (ref 136–145)
TOTAL PROTEIN: 6.6 g/dL (ref 6.4–8.3)

## 2015-10-10 LAB — CBC WITH DIFFERENTIAL/PLATELET
BASO%: 0.6 % (ref 0.0–2.0)
Basophils Absolute: 0 10*3/uL (ref 0.0–0.1)
EOS ABS: 0 10*3/uL (ref 0.0–0.5)
EOS%: 0.4 % (ref 0.0–7.0)
HCT: 35.7 % (ref 34.8–46.6)
HGB: 11.8 g/dL (ref 11.6–15.9)
LYMPH%: 22.1 % (ref 14.0–49.7)
MCH: 32 pg (ref 25.1–34.0)
MCHC: 33.1 g/dL (ref 31.5–36.0)
MCV: 96.6 fL (ref 79.5–101.0)
MONO#: 0.4 10*3/uL (ref 0.1–0.9)
MONO%: 12.5 % (ref 0.0–14.0)
NEUT#: 2.1 10*3/uL (ref 1.5–6.5)
NEUT%: 64.4 % (ref 38.4–76.8)
PLATELETS: 81 10*3/uL — AB (ref 145–400)
RBC: 3.7 10*6/uL (ref 3.70–5.45)
RDW: 15.8 % — ABNORMAL HIGH (ref 11.2–14.5)
WBC: 3.3 10*3/uL — ABNORMAL LOW (ref 3.9–10.3)
lymph#: 0.7 10*3/uL — ABNORMAL LOW (ref 0.9–3.3)

## 2015-10-10 LAB — UA PROTEIN, DIPSTICK - CHCC: PROTEIN: NEGATIVE mg/dL

## 2015-10-10 MED ORDER — SODIUM CHLORIDE 0.9 % IV SOLN
Freq: Once | INTRAVENOUS | Status: AC
  Administered 2015-10-10: 12:00:00 via INTRAVENOUS

## 2015-10-10 MED ORDER — SODIUM CHLORIDE 0.9 % IJ SOLN
10.0000 mL | INTRAMUSCULAR | Status: DC | PRN
  Administered 2015-10-10: 10 mL
  Filled 2015-10-10: qty 10

## 2015-10-10 MED ORDER — HEPARIN SOD (PORK) LOCK FLUSH 100 UNIT/ML IV SOLN
500.0000 [IU] | Freq: Once | INTRAVENOUS | Status: AC | PRN
  Administered 2015-10-10: 500 [IU]
  Filled 2015-10-10: qty 5

## 2015-10-10 MED ORDER — SODIUM CHLORIDE 0.9 % IV SOLN
14.5000 mg/kg | Freq: Once | INTRAVENOUS | Status: AC
  Administered 2015-10-10: 800 mg via INTRAVENOUS
  Filled 2015-10-10: qty 32

## 2015-10-10 MED ORDER — SODIUM CHLORIDE 0.9 % IV SOLN
600.0000 mg/m2 | Freq: Once | INTRAVENOUS | Status: AC
  Administered 2015-10-10: 920 mg via INTRAVENOUS
  Filled 2015-10-10: qty 46

## 2015-10-10 MED ORDER — SODIUM CHLORIDE 0.9 % IV SOLN
Freq: Once | INTRAVENOUS | Status: AC
  Administered 2015-10-10: 13:00:00 via INTRAVENOUS
  Filled 2015-10-10: qty 4

## 2015-10-10 NOTE — Telephone Encounter (Signed)
Appointments made and avs will be printed in chemo  °

## 2015-10-10 NOTE — Progress Notes (Signed)
ID: Cassandra Nichols   DOB: 1941-03-25  MR#: [id number]  EHU#:[id number]   PCP:  Marjorie Smolder, MD GYN:  [name], MD OTHER:  [name], MD  CHIEF COMPLAINT:  Recurrent Ovarian Cancer  CURRENT TREATMENT:  cyclophosphamide and bevacizumab  NOTE: THIS PATIENT REQUESTS THAT ABSOLUTELY NO NUMERIC INFORMATION BE GIVEN REGARDING ANY LAB AND PARTICULARLY DOES NOT WANT TO KNOW SPECIFIC CA-125 RESULTS  HISTORY OF PRESENT ILLNESS: From the earlier summary note:   Cassandra Nichols was initially diagnosed with ovarian cancer in July of 2002. She underwent debulking surgery at that time for what proved to be a 4 of 18 lymph nodes positive disease. She was initially treated with 6 cycles of paclitaxel and carboplatin adjuvantly, completed in December of 2002.  Patient has had multiple recurrences since December of 2002, and has been treated with multiple agents, as detailed below.  INTERVAL HISTORY: Cassandra Nichols returns today for follow up of her ovarian cancer.  She spends a good deal of August in [place] visiting friends in visiting her daughter. She came back and worked until October 7. There was a project that she did not likes that she "dropped out of it". However she will resume work and the same company October 27. -- she generally tolerates treatment well, but she did have an episode of vomiting 2 cycles back , and she missed her most recent treatment because her blood pressure was elevated. She tells me she is taking her [place] and Microzide as ordered although she apparently did not take it yesterday, she says. Also on the day after treatment she feels unusually hoarse. She is not having any other problems related to the therapy,  And specifically there has been no bleeding or bruising.   REVIEW OF SYSTEMS: Cassandra Nichols  Was having pain in the left hip going all the way down to her left foot. She went to acupuncture and it made it better. Then she went to a chiropractor and it got even a little bit better  although it never did go away. Currently she has a little bit of discomfort around her left ankle only.  She has some gum issues for which she took Cleocin but is currently off that medication ; she did not develop any diarrhea or similar problems..Aside from these issues, a detailed review of systems today was benign.  PAST MEDICAL HISTORY: Past Medical History  Diagnosis Date  . Shingles   . 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 Ovarian cancer 11/17/2011  . Cancer     PAST SURGICAL HISTORY: Past Surgical History  Procedure Laterality Date  . Ganglion cyst excision Left     Wrist    FAMILY HISTORY Family History  Problem Relation Age of Onset  . Hypertension Mother   . GER disease Mother   . Cancer Father     Cancer (stomach)    GYNECOLOGIC HISTORY: GXP2  SOCIAL HISTORY: (Updated 03/08/2014) Cassandra Nichols lives alone. She works temporary jobs, sometimes as a Oceanographer, and sometimes grading tests.  As of March 2015, she is working full-time hours at the [employer] center. She's primary caregiver for her and who is currently 17 and lives in [place].  Her daughter Cassandra Nichols lives in [place] and is Mudlogger of the epidemiology and [place] there; she is married with 2 children. Son  Cassandra Nichols lives in [place], [place]. He has 4 children of his own.His wife is a Marine scientist  ADVANCED DIRECTIVES: in place, but the patient has not been  able to find the documents. At the 08/15/2014 visit she was given the appropriate living will and health care power of attorney documents to complete and notarize at her discretion.  HEALTH MAINTENANCE:  (Updated  03/08/2014) Social History  Substance Use Topics  . Smoking status: Former Research scientist (life sciences)  . Smokeless tobacco: Never Used  . Alcohol Use: No     Comment: Occasionally     Colonoscopy:   PAP: s/p hysterectomy  Bone density:   Mammogram:  09/16/2015   Allergies  Allergen Reactions  . Penicillins Rash    Current Outpatient Prescriptions   Medication Sig Dispense Refill  . Alum & Mag Hydroxide-Simeth (MAGIC MOUTHWASH W/LIDOCAINE) SOLN Take 5 mLs by mouth 4 (four) times daily as needed. (Patient not taking: Reported on 02/21/2015) 240 mL 2  . dexamethasone (DECADRON) 4 MG tablet Take 4 mg by mouth as needed.    . hydrochlorothiazide (MICROZIDE) 12.5 MG capsule TAKE 1 CAPSULE (12.5 MG TOTAL) BY MOUTH DAILY. 30 capsule 3  . lisinopril (PRINIVIL,ZESTRIL) 20 MG tablet Take 1 tablet (20 mg total) by mouth daily. 30 tablet 3  . loratadine (CLARITIN) 10 MG tablet Take 10 mg by mouth daily as needed for allergies.    . Melatonin 3 MG TABS Take 3 mg by mouth at bedtime as needed.    . [DISCONTINUED] prochlorperazine (COMPAZINE) 10 MG tablet Take 1 tablet (10 mg total) by mouth every 6 (six) hours as needed (Nausea or vomiting). 30 tablet 1   No current facility-administered medications for this visit.   Facility-Administered Medications Ordered in Other Visits  Medication Dose Route Frequency Provider Last Rate Last Dose  . sodium chloride 0.9 % injection 10 mL  10 mL Intravenous PRN Cassandra Cruel, MD   10 mL at 05/24/14 1601    OBJECTIVE: Middle-aged white woman in no acute distress Filed Vitals:   10/10/15 1154  BP: 170/83  Pulse: 83  Temp: 98 F (36.7 C)  Resp: 20     Body mass index is 24.28 kg/(m^2).    ECOG FS: 1  Sclerae unicteric, pupils round and equal Oropharynx clear and moist-- no thrush or other lesions No cervical or supraclavicular adenopathy Lungs no rales or rhonchi Heart regular rate and rhythm Abd soft, nontender, positive bowel sounds MSK no focal spinal tenderness, no upper extremity lymphedema; straight leg raising was normal to 80 and figures of 4 were benign bilateral Neuro: nonfocal, well oriented, appropriate affect Breasts:  deferred    LAB RESULTS:  Lab Results  Component Value Date   WBC 3.3* 10/10/2015   NEUTROABS 2.1 10/10/2015   HGB 11.8 10/10/2015   HCT 35.7 10/10/2015   MCV 96.6  10/10/2015   PLT 81* 10/10/2015        Chemistry      Component Value Date/Time   NA 134* 10/10/2015 1133   NA 140 07/04/2012 0832   K 4.0 10/10/2015 1133   K 4.2 07/04/2012 0832   CL 106 05/09/2013 1304   CL 106 07/04/2012 0832   CO2 25 10/10/2015 1133   CO2 26 07/04/2012 0832   BUN 16.2 10/10/2015 1133   BUN 17 07/04/2012 0832   CREATININE 0.9 10/10/2015 1133   CREATININE 0.82 07/04/2012 0832      Component Value Date/Time   CALCIUM 9.6 10/10/2015 1133   CALCIUM 9.2 07/04/2012 0832   ALKPHOS 67 10/10/2015 1133   ALKPHOS 71 07/04/2012 0832   AST 21 10/10/2015 1133   AST 18 07/04/2012 8921  ALT 13 10/10/2015 1133   ALT 12 07/04/2012 0832   BILITOT 0.75 10/10/2015 1133   BILITOT 0.7 07/04/2012 0832    Results for HENNESSY, BARTEL (MRN [id number]) as of 10/10/2015 08:27  Ref. Range 03/21/2015 14:41 04/04/2015 15:03 06/06/2015 14:43 07/02/2015 10:26 08/29/2015 14:28  CA 125 Latest Ref Range: <35 U/mL 2540 (H) 2808 (H) 3100 (H) 3236 (H) 3215 (H)    STUDIES: Ct Abdomen Pelvis W Contrast  09/12/2015  CLINICAL DATA:  Restaging ovarian cancer. EXAM: CT ABDOMEN AND PELVIS WITH CONTRAST TECHNIQUE: Multidetector CT imaging of the abdomen and pelvis was performed using the standard protocol following bolus administration of intravenous contrast. CONTRAST:  124mL OMNIPAQUE IOHEXOL 300 MG/ML  SOLN COMPARISON:  06/19/2015 FINDINGS: Lower chest: The lung bases are clear. All pleural effusion and pulmonary nodule. The heart is normal in size. No pericardial effusion. The distal esophagus is grossly normal. There is a small hiatal hernia. Hepatobiliary: There is a stable lesion in the falciform ligament measuring 18 x 18 mm. Previously measured 20 x 17 mm. I do not see any other peritoneal implants involving the liver. There is associated mild biliary dilatation distal to the lesion. The gallbladder is normal. Normal caliber common bile duct. Pancreas: No mass, inflammation or ductal dilatation. Spleen:  Normal size.  No focal lesions. Adrenals/Urinary Tract: The adrenal glands are normal. The kidneys are normal and stable. Stomach/Bowel: The stomach, duodenum, small bowel and colon are unremarkable. No inflammatory changes, mass lesions or obstructive findings. Vascular/Lymphatic: Stable atherosclerotic calcifications involving the aorta. The branch vessels are patent. The major venous structures are patent. Stable ill-defined soft tissue lesion in the celiac axis area measuring 22 x 16 mm. It previously measured 25 x 18 mm. Numerous small retroperitoneal lymph nodes are again noted. A 7 mm node on image 24 previously measured 8 mm a 6.5 mm node on image number 27 is unchanged. 6 mm node on image 30 is unchanged. Other: The uterus and ovaries are surgically absent. Stable surgical clips. Soft tissue density in the left pelvis on image number 50 measures 18.5 x 16 mm. This is stable. A right pelvic soft tissue lesion on image number 51 measures 19.5 x 14.5 mm and is also stable. No new pelvic lesions. No inguinal mass or adenopathy. The bladder appears normal. Musculoskeletal: No significant bony findings. Stable L3 compression fracture. IMPRESSION: Overall stable hepatic, nodal an peritoneal disease as described above. Electronically Signed   By: Marijo Sanes M.D.   On: 09/12/2015 08:39   Mm Digital Screening Bilateral  09/16/2015  CLINICAL DATA:  Screening. EXAM: DIGITAL SCREENING BILATERAL MAMMOGRAM WITH CAD COMPARISON:  Previous exam(s). ACR Breast Density Category d: The breast tissue is extremely dense, which lowers the sensitivity of mammography. FINDINGS: There are no findings suspicious for malignancy. Images were processed with CAD. IMPRESSION: No mammographic evidence of malignancy. A result letter of this screening mammogram will be mailed directly to the patient. RECOMMENDATION: Screening mammogram in one year. (Code:SM-B-01Y) BI-RADS CATEGORY  1: Negative. Electronically Signed   By: Conchita Paris M.D.   On: 09/16/2015 14:08     ASSESSMENT: 74 y.o.  [place] woman (1)  with a history of ovarian cancer dating back to July 2002 when she underwent debulking surgery for what proved to be 4/18 lymph node positive disease.   (2)  She received 6 cycles of paclitaxel/carboplatin adjuvantly completed in December 2002.   (3)  She had her first recurrence in December 2005 treated with paclitaxel;   (  4)  second recurrence May 2007 treated with [place] and paclitaxel;   (5)  third recurrence January 2010 treated with topotecan and Gemzar;   (6)  fourth recurrence January 2012 treated with single-agent carboplatin (January to August 2012) with initial response, then plateau; with progression January 2013 (less than 6 months after last carbo dose) treated with single-agent gemzar given Q2 weeks (January to April 2013), with evidence of progression  (7) Doxil strartedJuly 2013 6, with evidence of initial response, and fair tolerance. Held between May and August 2014 due to patient's work schedule and scheduling issues.  Resumed treatment as of 08/24/2013, with some delays secondary to the patient's schedule. As of early December 2014, her total cumulative dose of Doxil reached 560 mg per meter square. In addition there was also an upward trend of the CA 125. Doxil was discontinued (last dose 10/30/2013). Echocardiogram on 11/14/2013 showed a well preserved ejection fraction of 55-60%.  (8)  treated with single agent paclitaxel, given on days one and 8 of each 21 day cycle, first dose on 12/27/2013,with continuing response, but mild to moderate toxicity; 11th and last dose 04/12/2014   (9) carboplatin and cyclophosphamide started on 08/28/14 given every 21 days-- stopped 10/10/2014 after a reaction to carboplatin (also no evidence of response)  (10) cyclophosphamide and bevacizumab started 12/02/2014, repeated every 21 days  (a) CT scan of the abdomen and pelvis 06/19/2015 shows stable  disease  (b) CT of the chest/abd/pelvis 09/12/2015 stable  PLAN:  Audryanna Zurita is now 14 years out from her initial diagnosis of ovarian cancer and doing remarkably well clinically.  We discussed her scans, which show stable disease. Her CEA 125, which she  refuses to discuss,  Has been creeping up slowly but was stable at the lastreading. The most important thing is that she remains very functional and clinically does not port symptoms suggestive of worsening disease.  She is having some issues with the chemotherapy, which causes her little bit of hoarseness and a little bit of nausea. We made  Mild changes to her medications today to see if we could improve on that.    she it is a bit more hypertensive although repeat blood pressure today when I checked it was lower than the machine reading. We may need to increase her blood pressure medication if this becomes more of an issue.   we are going to treat her today a, then again  [facility] November and early December. After that she will be off treatment until the first Friday in January at because she has a very complicated holiday schedule. I think he would be go out in any case for her to have a slight " vocation" from therapy.   she knows to call for any problems that may develop before her next visit here.   Cassandra Cruel, MD     10/10/2015

## 2015-10-10 NOTE — Progress Notes (Signed)
Okay to treat today with platelets 81,000 per Dr. Jana Hakim.

## 2015-10-10 NOTE — Patient Instructions (Signed)
[facility] Discharge Instructions for Patients Receiving Chemotherapy  Today you received the following chemotherapy agents Avastin/Cytoxan.  To help prevent nausea and vomiting after your treatment, we encourage you to take your nausea medication as directed.   If you develop nausea and vomiting that is not controlled by your nausea medication, call the clinic.   BELOW ARE SYMPTOMS THAT SHOULD BE REPORTED IMMEDIATELY:  *FEVER GREATER THAN 100.5 F  *CHILLS WITH OR WITHOUT FEVER  NAUSEA AND VOMITING THAT IS NOT CONTROLLED WITH YOUR NAUSEA MEDICATION  *UNUSUAL SHORTNESS OF BREATH  *UNUSUAL BRUISING OR BLEEDING  TENDERNESS IN MOUTH AND THROAT WITH OR WITHOUT PRESENCE OF ULCERS  *URINARY PROBLEMS  *BOWEL PROBLEMS  UNUSUAL RASH Items with * indicate a potential emergency and should be followed up as soon as possible.  Feel free to call the clinic you have any questions or concerns. The clinic phone number is [phone].  Please show the CHEMO ALERT CARD at check-in to the Emergency Department and triage nurse.    

## 2015-10-11 LAB — CA 125: CA 125: 3253 U/mL — AB (ref <35)

## 2015-10-17 ENCOUNTER — Ambulatory Visit: Payer: Medicare Other | Admitting: Oncology

## 2015-10-17 ENCOUNTER — Ambulatory Visit: Payer: Medicare Other

## 2015-10-17 ENCOUNTER — Other Ambulatory Visit: Payer: Medicare Other

## 2015-10-30 ENCOUNTER — Other Ambulatory Visit: Payer: Self-pay | Admitting: Neurology

## 2015-10-30 ENCOUNTER — Other Ambulatory Visit: Payer: Self-pay | Admitting: Oncology

## 2015-10-30 ENCOUNTER — Other Ambulatory Visit (HOSPITAL_BASED_OUTPATIENT_CLINIC_OR_DEPARTMENT_OTHER): Payer: Medicare Other

## 2015-10-30 ENCOUNTER — Ambulatory Visit (HOSPITAL_BASED_OUTPATIENT_CLINIC_OR_DEPARTMENT_OTHER): Payer: Medicare Other

## 2015-10-30 ENCOUNTER — Ambulatory Visit: Payer: Medicare Other

## 2015-10-30 VITALS — BP 154/78 | HR 76 | Temp 98.3°F | Resp 16

## 2015-10-30 DIAGNOSIS — C569 Malignant neoplasm of unspecified ovary: Secondary | ICD-10-CM

## 2015-10-30 DIAGNOSIS — Z5111 Encounter for antineoplastic chemotherapy: Secondary | ICD-10-CM

## 2015-10-30 DIAGNOSIS — Z5112 Encounter for antineoplastic immunotherapy: Secondary | ICD-10-CM

## 2015-10-30 LAB — COMPREHENSIVE METABOLIC PANEL (CC13)
ALBUMIN: 3.6 g/dL (ref 3.5–5.0)
ALK PHOS: 90 U/L (ref 40–150)
ALT: 17 U/L (ref 0–55)
AST: 25 U/L (ref 5–34)
Anion Gap: 9 mEq/L (ref 3–11)
BILIRUBIN TOTAL: 0.59 mg/dL (ref 0.20–1.20)
BUN: 14.9 mg/dL (ref 7.0–26.0)
CO2: 27 meq/L (ref 22–29)
Calcium: 9.5 mg/dL (ref 8.4–10.4)
Chloride: 105 mEq/L (ref 98–109)
Creatinine: 0.9 mg/dL (ref 0.6–1.1)
EGFR: 67 mL/min/{1.73_m2} — ABNORMAL LOW (ref >90)
GLUCOSE: 109 mg/dL (ref 70–140)
Potassium: 4 mEq/L (ref 3.5–5.1)
SODIUM: 140 meq/L (ref 136–145)
TOTAL PROTEIN: 6.6 g/dL (ref 6.4–8.3)

## 2015-10-30 LAB — CBC WITH DIFFERENTIAL/PLATELET
BASO%: 0.3 % (ref 0.0–2.0)
Basophils Absolute: 0 10*3/uL (ref 0.0–0.1)
EOS ABS: 0 10*3/uL (ref 0.0–0.5)
EOS%: 0.6 % (ref 0.0–7.0)
HCT: 35.1 % (ref 34.8–46.6)
HEMOGLOBIN: 11.6 g/dL (ref 11.6–15.9)
LYMPH%: 17.7 % (ref 14.0–49.7)
MCH: 32.3 pg (ref 25.1–34.0)
MCHC: 33 g/dL (ref 31.5–36.0)
MCV: 97.8 fL (ref 79.5–101.0)
MONO#: 0.6 10*3/uL (ref 0.1–0.9)
MONO%: 17.4 % — AB (ref 0.0–14.0)
NEUT%: 64 % (ref 38.4–76.8)
NEUTROS ABS: 2.3 10*3/uL (ref 1.5–6.5)
PLATELETS: 57 10*3/uL — AB (ref 145–400)
RBC: 3.59 10*6/uL — AB (ref 3.70–5.45)
RDW: 15.3 % — ABNORMAL HIGH (ref 11.2–14.5)
WBC: 3.6 10*3/uL — AB (ref 3.9–10.3)
lymph#: 0.6 10*3/uL — ABNORMAL LOW (ref 0.9–3.3)

## 2015-10-30 LAB — UA PROTEIN, DIPSTICK - CHCC: Protein, ur: NEGATIVE mg/dL

## 2015-10-30 MED ORDER — HEPARIN SOD (PORK) LOCK FLUSH 100 UNIT/ML IV SOLN
500.0000 [IU] | Freq: Once | INTRAVENOUS | Status: AC | PRN
  Administered 2015-10-30: 500 [IU]
  Filled 2015-10-30: qty 5

## 2015-10-30 MED ORDER — SODIUM CHLORIDE 0.9 % IV SOLN
Freq: Once | INTRAVENOUS | Status: AC
  Administered 2015-10-30: 16:00:00 via INTRAVENOUS

## 2015-10-30 MED ORDER — SODIUM CHLORIDE 0.9 % IV SOLN
Freq: Once | INTRAVENOUS | Status: AC
  Administered 2015-10-30: 16:00:00 via INTRAVENOUS
  Filled 2015-10-30: qty 5

## 2015-10-30 MED ORDER — SODIUM CHLORIDE 0.9 % IJ SOLN
10.0000 mL | INTRAMUSCULAR | Status: DC | PRN
  Administered 2015-10-30: 10 mL
  Filled 2015-10-30: qty 10

## 2015-10-30 MED ORDER — SODIUM CHLORIDE 0.9 % IV SOLN
14.5000 mg/kg | Freq: Once | INTRAVENOUS | Status: AC
  Administered 2015-10-30: 800 mg via INTRAVENOUS
  Filled 2015-10-30: qty 32

## 2015-10-30 MED ORDER — PALONOSETRON HCL INJECTION 0.25 MG/5ML
INTRAVENOUS | Status: AC
  Filled 2015-10-30: qty 5

## 2015-10-30 MED ORDER — PALONOSETRON HCL INJECTION 0.25 MG/5ML
0.2500 mg | Freq: Once | INTRAVENOUS | Status: AC
  Administered 2015-10-30: 0.25 mg via INTRAVENOUS

## 2015-10-30 MED ORDER — SODIUM CHLORIDE 0.9 % IV SOLN
600.0000 mg/m2 | Freq: Once | INTRAVENOUS | Status: AC
  Administered 2015-10-30: 920 mg via INTRAVENOUS
  Filled 2015-10-30: qty 46

## 2015-10-30 NOTE — Progress Notes (Unsigned)
Spoke with Zigmund Daniel RN, Dr. Virgie Dad nurse to make Dr. Jana Hakim aware of her platelets of 57. Per Dr. Jana Hakim okay to treat.

## 2015-10-30 NOTE — Patient Instructions (Signed)
[facility] Discharge Instructions for Patients Receiving Chemotherapy  Today you received the following chemotherapy agents avastin, cytoxan  To help prevent nausea and vomiting after your treatment, we encourage you to take your nausea medication   If you develop nausea and vomiting that is not controlled by your nausea medication, call the clinic.   BELOW ARE SYMPTOMS THAT SHOULD BE REPORTED IMMEDIATELY:  *FEVER GREATER THAN 100.5 F  *CHILLS WITH OR WITHOUT FEVER  NAUSEA AND VOMITING THAT IS NOT CONTROLLED WITH YOUR NAUSEA MEDICATION  *UNUSUAL SHORTNESS OF BREATH  *UNUSUAL BRUISING OR BLEEDING  TENDERNESS IN MOUTH AND THROAT WITH OR WITHOUT PRESENCE OF ULCERS  *URINARY PROBLEMS  *BOWEL PROBLEMS  UNUSUAL RASH Items with * indicate a potential emergency and should be followed up as soon as possible.  Feel free to call the clinic you have any questions or concerns. The clinic phone number is [phone].  Please show the [facility] at check-in to the Emergency Department and triage nurse.

## 2015-10-31 ENCOUNTER — Telehealth: Payer: Self-pay | Admitting: Oncology

## 2015-10-31 NOTE — Telephone Encounter (Signed)
Called and left a message with 11/18 appointments

## 2015-11-07 ENCOUNTER — Other Ambulatory Visit: Payer: Medicare Other

## 2015-11-21 ENCOUNTER — Ambulatory Visit (HOSPITAL_BASED_OUTPATIENT_CLINIC_OR_DEPARTMENT_OTHER): Payer: Medicare Other

## 2015-11-21 ENCOUNTER — Other Ambulatory Visit (HOSPITAL_BASED_OUTPATIENT_CLINIC_OR_DEPARTMENT_OTHER): Payer: Medicare Other

## 2015-11-21 ENCOUNTER — Ambulatory Visit: Payer: Medicare Other

## 2015-11-21 VITALS — BP 149/72 | HR 72 | Temp 98.2°F | Resp 18

## 2015-11-21 DIAGNOSIS — Z5111 Encounter for antineoplastic chemotherapy: Secondary | ICD-10-CM

## 2015-11-21 DIAGNOSIS — Z5112 Encounter for antineoplastic immunotherapy: Secondary | ICD-10-CM | POA: Diagnosis not present

## 2015-11-21 DIAGNOSIS — C569 Malignant neoplasm of unspecified ovary: Secondary | ICD-10-CM

## 2015-11-21 LAB — COMPREHENSIVE METABOLIC PANEL
ALT: 12 U/L (ref 0–55)
ANION GAP: 13 meq/L — AB (ref 3–11)
AST: 21 U/L (ref 5–34)
Albumin: 3.8 g/dL (ref 3.5–5.0)
Alkaline Phosphatase: 81 U/L (ref 40–150)
BILIRUBIN TOTAL: 0.49 mg/dL (ref 0.20–1.20)
BUN: 20.8 mg/dL (ref 7.0–26.0)
CHLORIDE: 106 meq/L (ref 98–109)
CO2: 22 meq/L (ref 22–29)
Calcium: 10 mg/dL (ref 8.4–10.4)
Creatinine: 1.1 mg/dL (ref 0.6–1.1)
EGFR: 52 mL/min/{1.73_m2} — AB (ref >90)
GLUCOSE: 118 mg/dL (ref 70–140)
POTASSIUM: 4.1 meq/L (ref 3.5–5.1)
SODIUM: 141 meq/L (ref 136–145)
TOTAL PROTEIN: 6.8 g/dL (ref 6.4–8.3)

## 2015-11-21 LAB — CBC WITH DIFFERENTIAL/PLATELET
BASO%: 0.8 % (ref 0.0–2.0)
BASOS ABS: 0 10*3/uL (ref 0.0–0.1)
EOS ABS: 0 10*3/uL (ref 0.0–0.5)
EOS%: 0.3 % (ref 0.0–7.0)
HCT: 36.1 % (ref 34.8–46.6)
HGB: 11.8 g/dL (ref 11.6–15.9)
LYMPH%: 16.2 % (ref 14.0–49.7)
MCH: 31.7 pg (ref 25.1–34.0)
MCHC: 32.7 g/dL (ref 31.5–36.0)
MCV: 96.9 fL (ref 79.5–101.0)
MONO#: 0.4 10*3/uL (ref 0.1–0.9)
MONO%: 12.3 % (ref 0.0–14.0)
NEUT#: 2.4 10*3/uL (ref 1.5–6.5)
NEUT%: 70.4 % (ref 38.4–76.8)
Platelets: 94 10*3/uL — ABNORMAL LOW (ref 145–400)
RBC: 3.73 10*6/uL (ref 3.70–5.45)
RDW: 15.7 % — AB (ref 11.2–14.5)
WBC: 3.4 10*3/uL — ABNORMAL LOW (ref 3.9–10.3)
lymph#: 0.6 10*3/uL — ABNORMAL LOW (ref 0.9–3.3)

## 2015-11-21 LAB — UA PROTEIN, DIPSTICK - CHCC

## 2015-11-21 MED ORDER — FOSAPREPITANT DIMEGLUMINE INJECTION 150 MG
Freq: Once | INTRAVENOUS | Status: AC
  Administered 2015-11-21: 16:00:00 via INTRAVENOUS
  Filled 2015-11-21: qty 5

## 2015-11-21 MED ORDER — SODIUM CHLORIDE 0.9 % IV SOLN
14.9000 mg/kg | Freq: Once | INTRAVENOUS | Status: AC
  Administered 2015-11-21: 800 mg via INTRAVENOUS
  Filled 2015-11-21: qty 32

## 2015-11-21 MED ORDER — PALONOSETRON HCL INJECTION 0.25 MG/5ML
INTRAVENOUS | Status: AC
Start: 2015-11-21 — End: 2015-11-21
  Filled 2015-11-21: qty 5

## 2015-11-21 MED ORDER — SODIUM CHLORIDE 0.9 % IV SOLN
Freq: Once | INTRAVENOUS | Status: AC
  Administered 2015-11-21: 15:00:00 via INTRAVENOUS

## 2015-11-21 MED ORDER — PALONOSETRON HCL INJECTION 0.25 MG/5ML
0.2500 mg | Freq: Once | INTRAVENOUS | Status: AC
  Administered 2015-11-21: 0.25 mg via INTRAVENOUS

## 2015-11-21 MED ORDER — SODIUM CHLORIDE 0.9 % IJ SOLN
10.0000 mL | INTRAMUSCULAR | Status: DC | PRN
  Administered 2015-11-21: 10 mL
  Filled 2015-11-21: qty 10

## 2015-11-21 MED ORDER — SODIUM CHLORIDE 0.9 % IV SOLN: 15.0000 mg/kg | Freq: Once | INTRAVENOUS | Status: DC

## 2015-11-21 MED ORDER — HEPARIN SOD (PORK) LOCK FLUSH 100 UNIT/ML IV SOLN
500.0000 [IU] | Freq: Once | INTRAVENOUS | Status: AC | PRN
  Administered 2015-11-21: 500 [IU]
  Filled 2015-11-21: qty 5

## 2015-11-21 MED ORDER — SODIUM CHLORIDE 0.9 % IV SOLN
600.0000 mg/m2 | Freq: Once | INTRAVENOUS | Status: AC
  Administered 2015-11-21: 920 mg via INTRAVENOUS
  Filled 2015-11-21: qty 46

## 2015-11-21 NOTE — Patient Instructions (Signed)
[facility] Discharge Instructions for Patients Receiving Chemotherapy  Today you received the following chemotherapy agents Avastin/Cytoxin  To help prevent nausea and vomiting after your treatment, we encourage you to take your nausea medication     If you develop nausea and vomiting that is not controlled by your nausea medication, call the clinic.   BELOW ARE SYMPTOMS THAT SHOULD BE REPORTED IMMEDIATELY:  *FEVER GREATER THAN 100.5 F  *CHILLS WITH OR WITHOUT FEVER  NAUSEA AND VOMITING THAT IS NOT CONTROLLED WITH YOUR NAUSEA MEDICATION  *UNUSUAL SHORTNESS OF BREATH  *UNUSUAL BRUISING OR BLEEDING  TENDERNESS IN MOUTH AND THROAT WITH OR WITHOUT PRESENCE OF ULCERS  *URINARY PROBLEMS  *BOWEL PROBLEMS  UNUSUAL RASH Items with * indicate a potential emergency and should be followed up as soon as possible.  Feel free to call the clinic you have any questions or concerns. The clinic phone number is [phone].  Please show the [facility] at check-in to the Emergency Department and triage nurse.

## 2015-11-22 LAB — CA 125: CA 125: 4261 U/mL — AB (ref <35)

## 2015-12-26 ENCOUNTER — Ambulatory Visit (HOSPITAL_BASED_OUTPATIENT_CLINIC_OR_DEPARTMENT_OTHER): Payer: Medicare Other | Admitting: Nurse Practitioner

## 2015-12-26 ENCOUNTER — Telehealth: Payer: Self-pay | Admitting: Nurse Practitioner

## 2015-12-26 ENCOUNTER — Ambulatory Visit (HOSPITAL_BASED_OUTPATIENT_CLINIC_OR_DEPARTMENT_OTHER): Payer: Medicare Other

## 2015-12-26 ENCOUNTER — Other Ambulatory Visit (HOSPITAL_BASED_OUTPATIENT_CLINIC_OR_DEPARTMENT_OTHER): Payer: Medicare Other

## 2015-12-26 ENCOUNTER — Encounter: Payer: Self-pay | Admitting: Nurse Practitioner

## 2015-12-26 VITALS — BP 175/83 | HR 87 | Temp 97.5°F | Resp 18 | Ht 59.0 in | Wt 117.7 lb

## 2015-12-26 VITALS — BP 152/63

## 2015-12-26 DIAGNOSIS — R971 Elevated cancer antigen 125 [CA 125]: Secondary | ICD-10-CM

## 2015-12-26 DIAGNOSIS — I158 Other secondary hypertension: Secondary | ICD-10-CM

## 2015-12-26 DIAGNOSIS — Z5112 Encounter for antineoplastic immunotherapy: Secondary | ICD-10-CM

## 2015-12-26 DIAGNOSIS — T50905A Adverse effect of unspecified drugs, medicaments and biological substances, initial encounter: Secondary | ICD-10-CM

## 2015-12-26 DIAGNOSIS — C569 Malignant neoplasm of unspecified ovary: Secondary | ICD-10-CM | POA: Diagnosis not present

## 2015-12-26 DIAGNOSIS — I10 Essential (primary) hypertension: Secondary | ICD-10-CM

## 2015-12-26 DIAGNOSIS — D696 Thrombocytopenia, unspecified: Secondary | ICD-10-CM

## 2015-12-26 DIAGNOSIS — Z5111 Encounter for antineoplastic chemotherapy: Secondary | ICD-10-CM

## 2015-12-26 LAB — CBC WITH DIFFERENTIAL/PLATELET
BASO%: 0.3 % (ref 0.0–2.0)
Basophils Absolute: 0 10*3/uL (ref 0.0–0.1)
EOS%: 0.6 % (ref 0.0–7.0)
Eosinophils Absolute: 0 10*3/uL (ref 0.0–0.5)
HEMATOCRIT: 32.2 % — AB (ref 34.8–46.6)
HGB: 10.6 g/dL — ABNORMAL LOW (ref 11.6–15.9)
LYMPH%: 22 % (ref 14.0–49.7)
MCH: 31.5 pg (ref 25.1–34.0)
MCHC: 32.9 g/dL (ref 31.5–36.0)
MCV: 95.5 fL (ref 79.5–101.0)
MONO#: 0.3 10*3/uL (ref 0.1–0.9)
MONO%: 10.5 % (ref 0.0–14.0)
NEUT%: 66.6 % (ref 38.4–76.8)
NEUTROS ABS: 2.1 10*3/uL (ref 1.5–6.5)
Platelets: 52 10*3/uL — ABNORMAL LOW (ref 145–400)
RBC: 3.37 10*6/uL — AB (ref 3.70–5.45)
RDW: 16.8 % — ABNORMAL HIGH (ref 11.2–14.5)
WBC: 3.1 10*3/uL — AB (ref 3.9–10.3)
lymph#: 0.7 10*3/uL — ABNORMAL LOW (ref 0.9–3.3)
nRBC: 0 % (ref 0–0)

## 2015-12-26 LAB — COMPREHENSIVE METABOLIC PANEL
ALT: 13 U/L (ref 0–55)
AST: 22 U/L (ref 5–34)
Albumin: 3.6 g/dL (ref 3.5–5.0)
Alkaline Phosphatase: 85 U/L (ref 40–150)
Anion Gap: 9 mEq/L (ref 3–11)
BUN: 18.8 mg/dL (ref 7.0–26.0)
CHLORIDE: 106 meq/L (ref 98–109)
CO2: 23 meq/L (ref 22–29)
CREATININE: 0.8 mg/dL (ref 0.6–1.1)
Calcium: 9.3 mg/dL (ref 8.4–10.4)
EGFR: 70 mL/min/{1.73_m2} — ABNORMAL LOW (ref >90)
GLUCOSE: 100 mg/dL (ref 70–140)
Potassium: 4.2 mEq/L (ref 3.5–5.1)
SODIUM: 139 meq/L (ref 136–145)
Total Bilirubin: 0.58 mg/dL (ref 0.20–1.20)
Total Protein: 6.5 g/dL (ref 6.4–8.3)

## 2015-12-26 LAB — UA PROTEIN, DIPSTICK - CHCC: PROTEIN: NEGATIVE mg/dL

## 2015-12-26 MED ORDER — PALONOSETRON HCL INJECTION 0.25 MG/5ML
0.2500 mg | Freq: Once | INTRAVENOUS | Status: AC
  Administered 2015-12-26: 0.25 mg via INTRAVENOUS

## 2015-12-26 MED ORDER — FUROSEMIDE 20 MG PO TABS
20.0000 mg | ORAL_TABLET | Freq: Once | ORAL | Status: AC
  Administered 2015-12-26: 20 mg via ORAL

## 2015-12-26 MED ORDER — SODIUM CHLORIDE 0.9 % IV SOLN
Freq: Once | INTRAVENOUS | Status: AC
  Administered 2015-12-26: 15:00:00 via INTRAVENOUS

## 2015-12-26 MED ORDER — FUROSEMIDE 20 MG PO TABS
ORAL_TABLET | ORAL | Status: AC
  Filled 2015-12-26: qty 1

## 2015-12-26 MED ORDER — SODIUM CHLORIDE 0.9 % IV SOLN
600.0000 mg/m2 | Freq: Once | INTRAVENOUS | Status: AC
  Administered 2015-12-26: 920 mg via INTRAVENOUS
  Filled 2015-12-26: qty 46

## 2015-12-26 MED ORDER — ONDANSETRON HCL 8 MG PO TABS: 8.0000 mg | ORAL_TABLET | Freq: Two times a day (BID) | ORAL | Status: DC

## 2015-12-26 MED ORDER — SODIUM CHLORIDE 0.9 % IV SOLN
14.7000 mg/kg | Freq: Once | INTRAVENOUS | Status: AC
  Administered 2015-12-26: 800 mg via INTRAVENOUS
  Filled 2015-12-26: qty 32

## 2015-12-26 MED ORDER — SODIUM CHLORIDE 0.9 % IV SOLN
Freq: Once | INTRAVENOUS | Status: AC
  Administered 2015-12-26: 15:00:00 via INTRAVENOUS
  Filled 2015-12-26: qty 5

## 2015-12-26 MED ORDER — SODIUM CHLORIDE 0.9 % IJ SOLN
10.0000 mL | INTRAMUSCULAR | Status: DC | PRN
  Administered 2015-12-26: 10 mL
  Filled 2015-12-26: qty 10

## 2015-12-26 MED ORDER — PROCHLORPERAZINE MALEATE 10 MG PO TABS: 10.0000 mg | ORAL_TABLET | Freq: Four times a day (QID) | ORAL | Status: DC | PRN

## 2015-12-26 MED ORDER — HEPARIN SOD (PORK) LOCK FLUSH 100 UNIT/ML IV SOLN
500.0000 [IU] | Freq: Once | INTRAVENOUS | Status: AC | PRN
  Administered 2015-12-26: 500 [IU]
  Filled 2015-12-26: qty 5

## 2015-12-26 MED ORDER — PALONOSETRON HCL INJECTION 0.25 MG/5ML
INTRAVENOUS | Status: AC
  Filled 2015-12-26: qty 5

## 2015-12-26 MED ORDER — OXYCODONE HCL 20 MG PO TABS: 1.0000 | ORAL_TABLET | Freq: Four times a day (QID) | ORAL | Status: DC

## 2015-12-26 NOTE — Progress Notes (Signed)
ID: Cassandra Nichols   DOB: 06/13/1941  MR#: [id number]  [id number]   PCP:  Marjorie Smolder, MD GYN:  [name], MD OTHER:  [name], MD  CHIEF COMPLAINT:  Recurrent Ovarian Cancer  CURRENT TREATMENT:  cyclophosphamide and bevacizumab  NOTE: THIS PATIENT REQUESTS THAT ABSOLUTELY NO NUMERIC INFORMATION BE GIVEN REGARDING ANY LAB AND PARTICULARLY DOES NOT WANT TO KNOW SPECIFIC CA-125 RESULTS  HISTORY OF PRESENT ILLNESS: From the earlier summary note:   Cassandra Nichols was initially diagnosed with ovarian cancer in July of 2002. She underwent debulking surgery at that time for what proved to be a 4 of 18 lymph nodes positive disease. She was initially treated with 6 cycles of paclitaxel and carboplatin adjuvantly, completed in December of 2002.  Patient has had multiple recurrences since December of 2002, and has been treated with multiple agents, as detailed below.  INTERVAL HISTORY: Cassandra Nichols returns today for follow up of her ovarian cancer. She is due for her next cycle of cyclophosphamide and bevacizumab today. Her blood pressure is high, but she has not taken her meds for the day. 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   REVIEW OF SYSTEMS: Cassandra Nichols generally feels well. She is overcoming some sinus congestion and now just has post nasal drainage. She is a bit short of breath with exertion, but denies chest pain, cough, or palpitations. She occasionally has a nerve pain shoot down her left hip into her left foot. She has a "twinge" of pain in her left ankle. But she does not want to make a big deal about this. She continues to have gum issues due to an oral condition. She denies fevers, chills, nausea, vomiting, or changes in bowel or bladder habits. A detailed review of systems is otherwise stable.  PAST MEDICAL HISTORY: Past Medical  History  Diagnosis Date  . Shingles   . 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 Ovarian cancer 11/17/2011  . Cancer     PAST SURGICAL HISTORY: Past Surgical History  Procedure Laterality Date  . Ganglion cyst excision Left     Wrist    FAMILY HISTORY Family History  Problem Relation Age of Onset  . Hypertension Mother   . GER disease Mother   . Cancer Father     Cancer (stomach)    GYNECOLOGIC HISTORY: GXP2  SOCIAL HISTORY: (Updated 03/08/2014) Cassandra Nichols lives alone. She works temporary jobs, sometimes as a Oceanographer, and sometimes grading tests.  As of March 2015, she is working full-time hours at the [employer] center. She's primary caregiver for her and who is currently 23 and lives in [place].  Her daughter Cassandra Nichols lives in [place] and is Mudlogger of the epidemiology and [employer] there; she is married with 2 children. Son  Cassandra Nichols lives in [place], [place]. He has 4 children of his own.His wife is a Marine scientist  ADVANCED DIRECTIVES: in place, but the patient has not been able to find the documents. At the 08/15/2014 visit she was given the appropriate living will and health care power of attorney documents to complete and notarize at her discretion.  HEALTH MAINTENANCE:  (Updated  03/08/2014) Social History  Substance Use Topics  . Smoking status: Former Research scientist (life sciences)  . Smokeless tobacco: Never Used  . Alcohol Use: No     Comment: Occasionally  Colonoscopy:   PAP: s/p hysterectomy  Bone density:   Mammogram:  09/16/2015   Allergies  Allergen Reactions  . Penicillins Rash    Current Outpatient Prescriptions  Medication Sig Dispense Refill  . hydrochlorothiazide (MICROZIDE) 12.5 MG capsule TAKE 1 CAPSULE (12.5 MG TOTAL) BY MOUTH DAILY. 30 capsule 3  . lisinopril (PRINIVIL,ZESTRIL) 20 MG tablet Take 1 tablet (20 mg total) by mouth daily. 30 tablet 3  . loratadine (CLARITIN) 10 MG tablet Take 10 mg by mouth daily as needed for allergies.    . Melatonin 3 MG TABS  Take 3 mg by mouth at bedtime as needed.    . Alum & Mag Hydroxide-Simeth (MAGIC MOUTHWASH W/LIDOCAINE) SOLN Take 5 mLs by mouth 4 (four) times daily as needed. (Patient not taking: Reported on 02/21/2015) 240 mL 2  . dexamethasone (DECADRON) 4 MG tablet Take 4 mg by mouth as needed. Reported on 12/26/2015    . ondansetron (ZOFRAN) 8 MG tablet Take 1 tablet (8 mg total) by mouth 2 (two) times daily. Take for 3 days after chemotherapy then as needed 30 tablet 6  . prochlorperazine (COMPAZINE) 10 MG tablet Take 1 tablet (10 mg total) by mouth every 6 (six) hours as needed for nausea or vomiting. 30 tablet 6   No current facility-administered medications for this visit.   Facility-Administered Medications Ordered in Other Visits  Medication Dose Route Frequency Provider Last Rate Last Dose  . bevacizumab (AVASTIN) 800 mg in sodium chloride 0.9 % 100 mL chemo infusion  14.7 mg/kg (Treatment Plan Actual) Intravenous Once Chauncey Cruel, MD      . cyclophosphamide (CYTOXAN) 920 mg in sodium chloride 0.9 % 250 mL chemo infusion  600 mg/m2 (Treatment Plan Actual) Intravenous Once Chauncey Cruel, MD      . fosaprepitant (EMEND) 150 mg, dexamethasone (DECADRON) 4 mg in sodium chloride 0.9 % 145 mL IVPB   Intravenous Once Laurie Panda, NP 451 mL/hr at 12/26/15 1525    . heparin lock flush 100 unit/mL  500 Units Intracatheter Once PRN Chauncey Cruel, MD      . sodium chloride 0.9 % injection 10 mL  10 mL Intravenous PRN Chauncey Cruel, MD   10 mL at 05/24/14 1601  . sodium chloride 0.9 % injection 10 mL  10 mL Intracatheter PRN Chauncey Cruel, MD   10 mL at 10/30/15 1718  . sodium chloride 0.9 % injection 10 mL  10 mL Intracatheter PRN Chauncey Cruel, MD        OBJECTIVE: Middle-aged white woman in no acute distress Filed Vitals:   12/26/15 1414  BP: 175/83  Pulse: 87  Temp: 97.5 F (36.4 C)  Resp: 18     Body mass index is 23.76 kg/(m^2).    ECOG FS: 1   Skin: warm, dry  HEENT:  sclerae anicteric, conjunctivae pink, oropharynx clear. No thrush or mucositis.  Lymph Nodes: No cervical or supraclavicular lymphadenopathy  Lungs: clear to auscultation bilaterally, no rales, wheezes, or rhonci  Heart: regular rate and rhythm  Abdomen: round, soft, non tender, positive bowel sounds  Musculoskeletal: No focal spinal tenderness, no peripheral edema  Neuro: non focal, well oriented, positive affect  Breasts: deferred  LAB RESULTS:  Lab Results  Component Value Date   WBC 3.1* 12/26/2015   NEUTROABS 2.1 12/26/2015   HGB 10.6* 12/26/2015   HCT 32.2* 12/26/2015   MCV 95.5 12/26/2015   PLT 52* 12/26/2015  Chemistry      Component Value Date/Time   NA 139 12/26/2015 1346   NA 140 07/04/2012 0832   K 4.2 12/26/2015 1346   K 4.2 07/04/2012 0832   CL 106 05/09/2013 1304   CL 106 07/04/2012 0832   CO2 23 12/26/2015 1346   CO2 26 07/04/2012 0832   BUN 18.8 12/26/2015 1346   BUN 17 07/04/2012 0832   CREATININE 0.8 12/26/2015 1346   CREATININE 0.82 07/04/2012 0832      Component Value Date/Time   CALCIUM 9.3 12/26/2015 1346   CALCIUM 9.2 07/04/2012 0832   ALKPHOS 85 12/26/2015 1346   ALKPHOS 71 07/04/2012 0832   AST 22 12/26/2015 1346   AST 18 07/04/2012 0832   ALT 13 12/26/2015 1346   ALT 12 07/04/2012 0832   BILITOT 0.58 12/26/2015 1346   BILITOT 0.7 07/04/2012 0832     Results for JULIETA, SCHLEE (MRN [id number]) as of 12/26/2015 15:37  Ref. Range 06/06/2015 14:43 07/02/2015 10:26 08/29/2015 14:28 10/10/2015 11:33 11/21/2015 14:36  CA 125 Latest Ref Range: <35 U/mL 3100 (H) 3236 (H) 3215 (H) 3253 (H) 4261 (H)    STUDIES: No results found.   ASSESSMENT: 75 y.o.  [place] woman (1)  with a history of ovarian cancer dating back to July 2002 when she underwent debulking surgery for what proved to be 4/18 lymph node positive disease.   (2)  She received 6 cycles of paclitaxel/carboplatin adjuvantly completed in December 2002.   (3)  She had her first  recurrence in December 2005 treated with paclitaxel;   (4)  second recurrence May 2007 treated with [place] and paclitaxel;   (5)  third recurrence January 2010 treated with topotecan and Gemzar;   (6)  fourth recurrence January 2012 treated with single-agent carboplatin (January to August 2012) with initial response, then plateau; with progression January 2013 (less than 6 months after last carbo dose) treated with single-agent gemzar given Q2 weeks (January to April 2013), with evidence of progression  (7) Doxil strartedJuly 2013 6, with evidence of initial response, and fair tolerance. Held between May and August 2014 due to patient's work schedule and scheduling issues.  Resumed treatment as of 08/24/2013, with some delays secondary to the patient's schedule. As of early December 2014, her total cumulative dose of Doxil reached 560 mg per meter square. In addition there was also an upward trend of the CA 125. Doxil was discontinued (last dose 10/30/2013). Echocardiogram on 11/14/2013 showed a well preserved ejection fraction of 55-60%.  (8)  treated with single agent paclitaxel, given on days one and 8 of each 21 day cycle, first dose on 12/27/2013,with continuing response, but mild to moderate toxicity; 11th and last dose 04/12/2014   (9) carboplatin and cyclophosphamide started on 08/28/14 given every 21 days-- stopped 10/10/2014 after a reaction to carboplatin (also no evidence of response)  (10) cyclophosphamide and bevacizumab started 12/02/2014, repeated every 21 days  (a) CT scan of the abdomen and pelvis 06/19/2015 shows stable disease  (b) CT of the chest/abd/pelvis 09/12/2015 stable  PLAN:  Atira Frazzini understands that she will not be able to continue on the avastin, if her blood pressure continues to be this high. She insists this is because she forgot to take her HCTZ and lisinopril this morning. We will administer 20mg  of lasix in the treatment room this afternoon, and she will get back  on her regular schedule tomorrow. I consulted Dr. Jana Hakim about her platelet count of 52, and he advised  that we are to continue with treatment as planned.   Her CA 125 continues to trend up slowly. Her last scans were in September. We will attempt to get a PET, but if this is denied we will try for a CT of the chest, abdomen, and pelvis. Sharnie Misek will have this done prior to her next treatment and visit in 3 weeks. She understands and agrees with this plan. She has been encouraged to call with any issues that might arise before her next visit here.    Laurie Panda, NP     12/26/2015

## 2015-12-26 NOTE — Patient Instructions (Signed)
[facility] Discharge Instructions for Patients Receiving Chemotherapy  Today you received the following chemotherapy agents Avastin/Cytoxan.  To help prevent nausea and vomiting after your treatment, we encourage you to take your nausea medication as prescribed.   If you develop nausea and vomiting that is not controlled by your nausea medication, call the clinic.   BELOW ARE SYMPTOMS THAT SHOULD BE REPORTED IMMEDIATELY:  *FEVER GREATER THAN 100.5 F  *CHILLS WITH OR WITHOUT FEVER  NAUSEA AND VOMITING THAT IS NOT CONTROLLED WITH YOUR NAUSEA MEDICATION  *UNUSUAL SHORTNESS OF BREATH  *UNUSUAL BRUISING OR BLEEDING  TENDERNESS IN MOUTH AND THROAT WITH OR WITHOUT PRESENCE OF ULCERS  *URINARY PROBLEMS  *BOWEL PROBLEMS  UNUSUAL RASH Items with * indicate a potential emergency and should be followed up as soon as possible.  Feel free to call the clinic you have any questions or concerns. The clinic phone number is [phone].  Please show the [facility] at check-in to the Emergency Department and triage nurse.

## 2015-12-26 NOTE — Telephone Encounter (Signed)
Appointments made and avs will be printed in chemo  °

## 2015-12-26 NOTE — Progress Notes (Signed)
Platelets 52 today. OK to treat per Nira Conn, NP and Dr. Jana Hakim. BP elevated at today's visit, patient to get lasix during treatment apt; OK to proceed with Cytoxan and Avastin.

## 2015-12-27 LAB — CA 125: CA 125: 4121 U/mL — ABNORMAL HIGH (ref <35)

## 2015-12-28 ENCOUNTER — Other Ambulatory Visit: Payer: Self-pay | Admitting: Oncology

## 2015-12-30 ENCOUNTER — Other Ambulatory Visit: Payer: Self-pay | Admitting: *Deleted

## 2015-12-30 ENCOUNTER — Encounter: Payer: Self-pay | Admitting: Oncology

## 2015-12-30 NOTE — Progress Notes (Signed)
[employer] faxed prior auth for ondansetron

## 2015-12-31 ENCOUNTER — Encounter: Payer: Self-pay | Admitting: Oncology

## 2015-12-31 NOTE — Progress Notes (Signed)
Per [employer](left message on vmail) ondansetron was approved and letter will be mailed.

## 2016-01-12 ENCOUNTER — Other Ambulatory Visit: Payer: Self-pay | Admitting: Oncology

## 2016-01-15 ENCOUNTER — Other Ambulatory Visit: Payer: Self-pay | Admitting: Oncology

## 2016-01-15 ENCOUNTER — Encounter (HOSPITAL_COMMUNITY)
Admission: RE | Admit: 2016-01-15 | Discharge: 2016-01-15 | Disposition: A | Discharge status: Home / Self Care | Payer: Medicare Other | Source: Ambulatory Visit | Attending: Nurse Practitioner | Admitting: Nurse Practitioner

## 2016-01-15 DIAGNOSIS — C569 Malignant neoplasm of unspecified ovary: Secondary | ICD-10-CM | POA: Diagnosis not present

## 2016-01-15 LAB — GLUCOSE, CAPILLARY: Glucose-Capillary: 92 mg/dL (ref 65–99)

## 2016-01-15 MED ORDER — FLUDEOXYGLUCOSE F - 18 (FDG) INJECTION
5.8000 | Freq: Once | INTRAVENOUS | Status: AC | PRN
  Administered 2016-01-15: 5.8 via INTRAVENOUS

## 2016-01-16 ENCOUNTER — Ambulatory Visit (HOSPITAL_BASED_OUTPATIENT_CLINIC_OR_DEPARTMENT_OTHER): Payer: Medicare Other

## 2016-01-16 ENCOUNTER — Ambulatory Visit (HOSPITAL_BASED_OUTPATIENT_CLINIC_OR_DEPARTMENT_OTHER): Payer: Medicare Other | Admitting: Oncology

## 2016-01-16 ENCOUNTER — Telehealth: Payer: Self-pay | Admitting: *Deleted

## 2016-01-16 ENCOUNTER — Other Ambulatory Visit (HOSPITAL_BASED_OUTPATIENT_CLINIC_OR_DEPARTMENT_OTHER): Payer: Medicare Other

## 2016-01-16 ENCOUNTER — Telehealth: Payer: Self-pay | Admitting: Oncology

## 2016-01-16 ENCOUNTER — Other Ambulatory Visit: Payer: Self-pay | Admitting: Oncology

## 2016-01-16 VITALS — BP 188/80 | HR 84 | Temp 97.8°F | Resp 19 | Ht 59.0 in | Wt 116.9 lb

## 2016-01-16 DIAGNOSIS — C569 Malignant neoplasm of unspecified ovary: Secondary | ICD-10-CM

## 2016-01-16 DIAGNOSIS — Z5111 Encounter for antineoplastic chemotherapy: Secondary | ICD-10-CM

## 2016-01-16 DIAGNOSIS — C787 Secondary malignant neoplasm of liver and intrahepatic bile duct: Secondary | ICD-10-CM

## 2016-01-16 DIAGNOSIS — D696 Thrombocytopenia, unspecified: Secondary | ICD-10-CM

## 2016-01-16 DIAGNOSIS — C772 Secondary and unspecified malignant neoplasm of intra-abdominal lymph nodes: Secondary | ICD-10-CM

## 2016-01-16 DIAGNOSIS — I10 Essential (primary) hypertension: Secondary | ICD-10-CM

## 2016-01-16 LAB — CBC WITH DIFFERENTIAL/PLATELET
BASO%: 0.5 % (ref 0.0–2.0)
Basophils Absolute: 0 10*3/uL (ref 0.0–0.1)
EOS ABS: 0 10*3/uL (ref 0.0–0.5)
EOS%: 0.2 % (ref 0.0–7.0)
HCT: 34.8 % (ref 34.8–46.6)
HEMOGLOBIN: 11.2 g/dL — AB (ref 11.6–15.9)
LYMPH%: 16.1 % (ref 14.0–49.7)
MCH: 31.1 pg (ref 25.1–34.0)
MCHC: 32 g/dL (ref 31.5–36.0)
MCV: 97 fL (ref 79.5–101.0)
MONO#: 0.3 10*3/uL (ref 0.1–0.9)
MONO%: 10.1 % (ref 0.0–14.0)
NEUT%: 73.1 % (ref 38.4–76.8)
NEUTROS ABS: 2 10*3/uL (ref 1.5–6.5)
PLATELETS: 57 10*3/uL — AB (ref 145–400)
RBC: 3.59 10*6/uL — ABNORMAL LOW (ref 3.70–5.45)
RDW: 18.8 % — AB (ref 11.2–14.5)
WBC: 2.8 10*3/uL — AB (ref 3.9–10.3)
lymph#: 0.4 10*3/uL — ABNORMAL LOW (ref 0.9–3.3)

## 2016-01-16 LAB — COMPREHENSIVE METABOLIC PANEL
ALBUMIN: 3.8 g/dL (ref 3.5–5.0)
ALK PHOS: 123 U/L (ref 40–150)
ALT: 26 U/L (ref 0–55)
AST: 29 U/L (ref 5–34)
Anion Gap: 11 mEq/L (ref 3–11)
BILIRUBIN TOTAL: 0.66 mg/dL (ref 0.20–1.20)
BUN: 17.4 mg/dL (ref 7.0–26.0)
CO2: 23 mEq/L (ref 22–29)
CREATININE: 0.9 mg/dL (ref 0.6–1.1)
Calcium: 9.5 mg/dL (ref 8.4–10.4)
Chloride: 104 mEq/L (ref 98–109)
EGFR: 64 mL/min/{1.73_m2} — AB (ref >90)
GLUCOSE: 112 mg/dL (ref 70–140)
Potassium: 3.6 mEq/L (ref 3.5–5.1)
SODIUM: 138 meq/L (ref 136–145)
TOTAL PROTEIN: 7 g/dL (ref 6.4–8.3)

## 2016-01-16 LAB — UA PROTEIN, DIPSTICK - CHCC: Protein, ur: NEGATIVE mg/dL

## 2016-01-16 MED ORDER — HEPARIN SOD (PORK) LOCK FLUSH 100 UNIT/ML IV SOLN
500.0000 [IU] | Freq: Once | INTRAVENOUS | Status: AC | PRN
  Administered 2016-01-16: 500 [IU]
  Filled 2016-01-16: qty 5

## 2016-01-16 MED ORDER — PACLITAXEL PROTEIN-BOUND CHEMO INJECTION 100 MG
100.0000 mg/m2 | Freq: Once | INTRAVENOUS | Status: AC
  Administered 2016-01-16: 150 mg via INTRAVENOUS
  Filled 2016-01-16: qty 30

## 2016-01-16 MED ORDER — SODIUM CHLORIDE 0.9% FLUSH
10.0000 mL | INTRAVENOUS | Status: DC | PRN
  Administered 2016-01-16: 10 mL
  Filled 2016-01-16: qty 10

## 2016-01-16 MED ORDER — SODIUM CHLORIDE 0.9 % IV SOLN
Freq: Once | INTRAVENOUS | Status: AC
Start: 2016-01-16 — End: 2016-01-16
  Administered 2016-01-16: 13:00:00 via INTRAVENOUS

## 2016-01-16 MED ORDER — PROCHLORPERAZINE MALEATE 10 MG PO TABS
ORAL_TABLET | ORAL | Status: AC
  Filled 2016-01-16: qty 1

## 2016-01-16 MED ORDER — PROCHLORPERAZINE MALEATE 10 MG PO TABS
10.0000 mg | ORAL_TABLET | Freq: Once | ORAL | Status: AC
  Administered 2016-01-16: 10 mg via ORAL

## 2016-01-16 NOTE — Telephone Encounter (Signed)
Per MD ok to treat pt today with Abraxane with platelet count of 50,000.  Called to chemo room and informed RN assigned to pt.

## 2016-01-16 NOTE — Progress Notes (Signed)
ID: Cassandra Nichols   DOB: 03/23/1941  MR#: [id number]  [id number]   PCP:  Cassandra Smolder, MD GYN:  Cassandra Laroche, MD OTHER:  Cassandra Peach, MD  CHIEF COMPLAINT:  Recurrent Ovarian Cancer  CURRENT TREATMENT:  Abraxane  NOTE: THIS PATIENT REQUESTS THAT ABSOLUTELY NO NUMERIC INFORMATION BE GIVEN REGARDING ANY LAB AND PARTICULARLY DOES NOT WANT TO KNOW SPECIFIC CA-125 RESULTS  HISTORY OF PRESENT ILLNESS: From the earlier summary note:   Cassandra Nichols was initially diagnosed with ovarian cancer in July of 2002. She underwent debulking surgery at that time for what proved to be a 4 of 18 lymph nodes positive disease. She was initially treated with 6 cycles of paclitaxel and carboplatin adjuvantly, completed in December of 2002.  Patient has had multiple recurrences since December of 2002, and has been treated with multiple agents, as detailed below.  INTERVAL HISTORY: Cassandra Nichols returns today for follow up of her recurrent ovarian cancer. The interval history is generally unremarkable. She enjoyed the holidays, which she spent largely with her son Cassandra Nichols in [place]. She also visited with a close friend of hers. She is going to start work back at  [employer], and will be employed there between February and July.  She has tolerated her Cytoxan and bevacizumab well, except for hypertension, which is doubtless due to the bevacizumab--she did not have this problem previously. Unfortunately restaging scans show significant disease progression, particularly in the liver.   REVIEW OF SYSTEMS: Cassandra Nichols generally feels well. 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 She has some chronic dental problems and she goes to the [employer] to get her teeth cleaned and have cavity is taking care of. She denies loss of appetite, altered taste, nausea, or vomiting. She has mild sinus symptoms. Her weight is stable and she has not noted any abdominal distention. There has not been any change in bowel or bladder  habits. She has mild sinus symptoms including a runny nose and slight hoarseness. She is doing yoga stretches and walking for exercise. A detailed review of systems today was otherwise stable.  PAST MEDICAL HISTORY: Past Medical History  Diagnosis Date  . Shingles   . 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 Ovarian cancer ([place]) 11/17/2011  . Cancer [facility])     PAST SURGICAL HISTORY: Past Surgical History  Procedure Laterality Date  . Ganglion cyst excision Left     Wrist    FAMILY HISTORY Family History  Problem Relation Age of Onset  . Hypertension Mother   . GER disease Mother   . Cancer Father     Cancer (stomach)   the patient's father died at the age of 51, from cancer that may have it started in his abdomen. He had 8 siblings including 2 sisters with breast cancer. The patient's mother had by Dr. cancer, but died from unrelated causes at the age of 75. The patient's maternal grandmother may have had ovarian cancer. The patient is an only child   GYNECOLOGIC HISTORY: Menarche age 25, first live birth age 69. The patient is GX P1. She went through the change of life around age 65. She subsequently had a TAH/BSO was part of her ovarian cancer treatment.  SOCIAL HISTORY: (Updated 03/08/2014) Cassandra Nichols lives alone. She works temporary jobs, primarily Nutritional therapist.  Her (adopted) daughter Cassandra Nichols lives [place] and is Mudlogger of the epidemiology and [place] there; she is married with 2 children. Son  Cassandra Nichols lives in [place], [place].  He has 4 children of his own. He is Tree surgeon for a [employer]. His wife is a Marine scientist   ADVANCED DIRECTIVES: At the 01/16/2016 visit Cassandra Nichols tells me that her son Cassandra Nichols is her healthcare power of attorney. He can be reached at [phone]. [Her daughter Cassandra Nichols is her financial power of attorney.]  HEALTH MAINTENANCE:  (Updated  03/08/2014) Social History  Substance Use Topics  . Smoking status: Former Research scientist (life sciences)  . Smokeless tobacco: Never  Used  . Alcohol Use: No     Comment: Occasionally     Colonoscopy:   PAP: s/p hysterectomy BSO  Bone density:   Mammogram:  09/16/2015   Allergies  Allergen Reactions  . Penicillins Rash    Current Outpatient Prescriptions  Medication Sig Dispense Refill  . Alum & Mag Hydroxide-Simeth (MAGIC MOUTHWASH W/LIDOCAINE) SOLN Take 5 mLs by mouth 4 (four) times daily as needed. (Patient not taking: Reported on 02/21/2015) 240 mL 2  . dexamethasone (DECADRON) 4 MG tablet STARTING THE DAY AFTER CHEMO, TAKE 2 TABLETS BY MOUTH TWICE DAILY FOR 3 DAYS 30 tablet 0  . hydrochlorothiazide (MICROZIDE) 12.5 MG capsule TAKE 1 CAPSULE EVERY DAY 30 capsule 3  . lisinopril (PRINIVIL,ZESTRIL) 20 MG tablet Take 1 tablet (20 mg total) by mouth daily. 30 tablet 3  . loratadine (CLARITIN) 10 MG tablet Take 10 mg by mouth daily as needed for allergies.    . Melatonin 3 MG TABS Take 3 mg by mouth at bedtime as needed.    . ondansetron (ZOFRAN) 8 MG tablet Take 1 tablet (8 mg total) by mouth 2 (two) times daily. Take for 3 days after chemotherapy then as needed 30 tablet 6  . prochlorperazine (COMPAZINE) 10 MG tablet Take 1 tablet (10 mg total) by mouth every 6 (six) hours as needed for nausea or vomiting. 30 tablet 6   No current facility-administered medications for this visit.   Facility-Administered Medications Ordered in Other Visits  Medication Dose Route Frequency Provider Last Rate Last Dose  . sodium chloride 0.9 % injection 10 mL  10 mL Intravenous PRN Cassandra Cruel, MD   10 mL at 05/24/14 1601  . sodium chloride 0.9 % injection 10 mL  10 mL Intracatheter PRN Cassandra Cruel, MD   10 mL at 10/30/15 1718    OBJECTIVE: Middle-aged white woman who appears stated age 13 Vitals:   01/16/16 1112  BP: 188/80  Pulse: 84  Temp: 97.8 F (36.6 C)  Resp: 19     Body mass index is 23.6 kg/(m^2).    ECOG FS: 1  Sclerae unicteric, pupils round and equal Oropharynx clear and moist-- no thrush or  other lesions No cervical or supraclavicular adenopathy Lungs no rales or rhonchi Heart regular rate and rhythm Abd soft, nontender, positive bowel sounds, no masses palpated MSK no focal spinal tenderness, no upper extremity lymphedema Neuro: nonfocal, well oriented, appropriate affect Breasts: Deferred  LAB RESULTS:  Lab Results  Component Value Date   WBC 2.8* 01/16/2016   NEUTROABS 2.0 01/16/2016   HGB 11.2* 01/16/2016   HCT 34.8 01/16/2016   MCV 97.0 01/16/2016   PLT 57* 01/16/2016        Chemistry      Component Value Date/Time   NA 139 12/26/2015 1346   NA 140 07/04/2012 0832   K 4.2 12/26/2015 1346   K 4.2 07/04/2012 0832   CL 106 05/09/2013 1304   CL 106 07/04/2012 0832   CO2 23 12/26/2015 1346  CO2 26 07/04/2012 0832   BUN 18.8 12/26/2015 1346   BUN 17 07/04/2012 0832   CREATININE 0.8 12/26/2015 1346   CREATININE 0.82 07/04/2012 0832      Component Value Date/Time   CALCIUM 9.3 12/26/2015 1346   CALCIUM 9.2 07/04/2012 0832   ALKPHOS 85 12/26/2015 1346   ALKPHOS 71 07/04/2012 0832   AST 22 12/26/2015 1346   AST 18 07/04/2012 0832   ALT 13 12/26/2015 1346   ALT 12 07/04/2012 0832   BILITOT 0.58 12/26/2015 1346   BILITOT 0.7 07/04/2012 0832     Results for TICEY, COTTLE (MRN [id number]) as of 01/16/2016 13:27  Ref. Range 07/02/2015 10:26 08/29/2015 14:28 10/10/2015 11:33 11/21/2015 14:36 12/26/2015 13:46  CA 125 Latest Ref Range: <35 U/mL 3236 (H) 3215 (H) 3253 (H) 4261 (H) 4121 (H)   STUDIES: Nm Pet Image Restag (ps) Skull Base To Thigh  01/15/2016  CLINICAL DATA:  Subsequent treatment strategy for ovarian carcinoma. EXAM: NUCLEAR MEDICINE PET SKULL BASE TO THIGH TECHNIQUE: 5.8 mCi F-18 FDG was injected intravenously. Full-ring PET imaging was performed from the skull base to thigh after the radiotracer. CT data was obtained and used for attenuation correction and anatomic localization. FASTING BLOOD GLUCOSE:  Value: 92 mg/dl COMPARISON:  09/12/2015  FINDINGS: NECK No hypermetabolic lymph nodes identified within the soft tissues of the neck. CHEST No hypermetabolic mediastinal or hilar nodes. Small right internal mammary lymph node measures 6 mm and has an SUV max equal to 2.45. No suspicious pulmonary nodules on the CT scan. ABDOMEN/PELVIS Hypermetabolic tumor within the right lobe of liver measures 3.2 cm and has an SUV max equal to 12.7. No abnormal hypermetabolic activity within the pancreas, adrenal glands, or spleen. Multiple hypermetabolic upper abdominal lymph nodes are identified. Index lymph node within the porta hepatis measures 2 cm and has an SUV max equal to 9.29. New from previous exam. Persistent gastrohepatic ligament lymph nodes are identified. Currently 2.5 cm with an SUV max equal to 8.08. Index left periaortic node measures 7 mm and has an SUV max equal to 5.8. Unchanged in size from previous CT. There is a peritoneal deposit within the posterior pelvis which measures 1.9 cm and has an SUV max equal to 7.07. This is unchanged in size from previous CT. Peritoneal deposit within the left posterior pelvis measures 1.5 cm and has an SUV max equal to 6.4. SKELETON No focal hypermetabolic activity to suggest skeletal metastasis. IMPRESSION: 1. Hypermetabolic liver metastasis identified. The index lesion appears increased in size from previous CT. 2. Persistent upper abdominal adenopathy which exhibits malignant range FDG uptake on the PET-CT. When compared with the previous exam there is a new area of hypermetabolic adenopathy within the porta hepatis region. 3. No significant change in size of currently hypermetabolic peritoneal deposits within the pelvis. 4. There is a tiny right internal mammary lymph node which exhibits mild FDG uptake, nonspecific. Attention on follow-up imaging advise. 5. Electronically Signed   By: Kerby Moors M.D.   On: 01/15/2016 12:29     ASSESSMENT: 75 y.o.  [place] woman (1)  with a history of ovarian cancer  dating back to July 2002 when she underwent debulking surgery for what proved to be 4/18 lymph node positive disease.   (2)  She received 6 cycles of paclitaxel/carboplatin adjuvantly completed in December 2002.   (3)  She had her first recurrence in December 2005 treated with paclitaxel;   (4)  second recurrence May 2007 treated with [place] and  paclitaxel;   (5)  third recurrence January 2010 treated with topotecan and Gemzar;   (6)  fourth recurrence January 2012 treated with single-agent carboplatin (January to August 2012) with initial response, then plateau; with progression January 2013 (less than 6 months after last carbo dose) treated with single-agent gemzar given Q2 weeks (January to April 2013), with evidence of progression  (7) Doxil strartedJuly 2013 6, with evidence of initial response, and fair tolerance. Held between May and August 2014 due to patient's work schedule and scheduling issues.  Resumed treatment as of 08/24/2013, with some delays secondary to the patient's schedule. As of early December 2014, her total cumulative dose of Doxil reached 560 mg per meter square. In addition there was also an upward trend of the CA 125. Doxil was discontinued (last dose 10/30/2013). Echocardiogram on 11/14/2013 showed a well preserved ejection fraction of 55-60%.  (8)  treated with single agent paclitaxel, given on days one and 8 of each 21 day cycle, first dose on 12/27/2013,with continuing response, but mild to moderate toxicity; 11th and last dose 04/12/2014   (9) carboplatin and cyclophosphamide started on 08/28/14 given every 21 days-- stopped 10/10/2014 after a reaction to carboplatin (also no evidence of response)  (10) cyclophosphamide and bevacizumab started 12/02/2014, repeated every 21 days  (a) CT scan of the abdomen and pelvis 06/19/2015 shows stable disease  (b) CT of the chest/abd/pelvis 09/12/2015 stable  (c) PET scan 01/15/2016 shows progression in the liver and upper  abdominal nodes  (11) abraxane started 01/16/2016, to be repeated every 14 days  (12) offered genetics testing at the 01/16/2016 visit  PLAN:  I spent approximately one hour with Cassandra Nichols today going over her situation in detail. This is difficult because she refuses to hear "negative news" and in particular refuses to receive any numeric information. She does this to protect herself because she knows she would worry over much and become emotionally disabled.  Accordingly I reviewed her situation  within those constraints. She understands that the treatment she has been receiving is not working and that there is disease progression. Furthermore the bevacizumab was causing her hypertension. We are stopping those agents.  We are going to use Abraxane. We discussed the possible toxicities, side effects and complications of that agent. She prefers to receive it every 2 weeks instead of days 1 and 8 of each 21 day cycle. She understands that even though I cannot give her actual counts, her "bone marrow is getting a little tired", so that may compromise our ability to treat her on time and also may compromise our doses.   I also updated her database. She definitely qualifies for genetics testing and this was suffered today. She wants to "think about it". She understands this would be particularly important for her son and her grandchildren if she proves to carry a deleterious mutation.  I did ascertain that her son Cassandra Nichols is her healthcare power of attorney and I have his phone number. I also clarified her family history and I urged her to undergo genetic testing.  Aside from the thrombocytopenia, I am concerned that she already has some peripheral neuropathy particularly affecting the feet. We will have to watch this very carefully if we continue with the Abraxane.  Assuming she tolerates this the plan will be for a total of 6 doses after which we will restage.  Markin is in agreement with this plan.  She knows to call for any problems that may develop before her next  visit here.   Cassandra Cruel, MD     01/16/2016

## 2016-01-16 NOTE — Telephone Encounter (Signed)
Appointments made per 1/27 pof

## 2016-01-16 NOTE — Patient Instructions (Signed)
Nanoparticle Albumin-Bound Paclitaxel injection What is this medicine? NANOPARTICLE ALBUMIN-BOUND PACLITAXEL (Na no PAHR ti kuhl al BYOO muhn-bound PAK li TAX el) is a chemotherapy drug. It targets fast dividing cells, like cancer cells, and causes these cells to die. This medicine is used to treat advanced breast cancer and advanced lung cancer. This medicine may be used for other purposes; ask your health care provider or pharmacist if you have questions. What should I tell my health care provider before I take this medicine? They need to know if you have any of these conditions: -kidney disease -liver disease -low blood counts, like low platelets, red blood cells, or white blood cells -recent or ongoing radiation therapy -an unusual or allergic reaction to paclitaxel, albumin, other chemotherapy, other medicines, foods, dyes, or preservatives -pregnant or trying to get pregnant -breast-feeding How should I use this medicine? This drug is given as an infusion into a vein. It is administered in a hospital or clinic by a specially trained health care professional. Talk to your pediatrician regarding the use of this medicine in children. Special care may be needed. Overdosage: If you think you have taken too much of this medicine contact a poison control center or emergency room at once. NOTE: This medicine is only for you. Do not share this medicine with others. What if I miss a dose? It is important not to miss your dose. Call your doctor or health care professional if you are unable to keep an appointment. What may interact with this medicine? -cyclosporine -diazepam -ketoconazole -medicines to increase blood counts like filgrastim, pegfilgrastim, sargramostim -other chemotherapy drugs like cisplatin, doxorubicin, epirubicin, etoposide, teniposide, vincristine -quinidine -testosterone -vaccines -verapamil Talk to your doctor or health care professional before taking any of these  medicines: -acetaminophen -aspirin -ibuprofen -ketoprofen -naproxen This list may not describe all possible interactions. Give your health care provider a list of all the medicines, herbs, non-prescription drugs, or dietary supplements you use. Also tell them if you smoke, drink alcohol, or use illegal drugs. Some items may interact with your medicine. What should I watch for while using this medicine? Your condition will be monitored carefully while you are receiving this medicine. You will need important blood work done while you are taking this medicine. This drug may make you feel generally unwell. This is not uncommon, as chemotherapy can affect healthy cells as well as cancer cells. Report any side effects. Continue your course of treatment even though you feel ill unless your doctor tells you to stop. In some cases, you may be given additional medicines to help with side effects. Follow all directions for their use. Call your doctor or health care professional for advice if you get a fever, chills or sore throat, or other symptoms of a cold or flu. Do not treat yourself. This drug decreases your body's ability to fight infections. Try to avoid being around people who are sick. This medicine may increase your risk to bruise or bleed. Call your doctor or health care professional if you notice any unusual bleeding. Be careful brushing and flossing your teeth or using a toothpick because you may get an infection or bleed more easily. If you have any dental work done, tell your dentist you are receiving this medicine. Avoid taking products that contain aspirin, acetaminophen, ibuprofen, naproxen, or ketoprofen unless instructed by your doctor. These medicines may hide a fever. Do not become pregnant while taking this medicine. Women should inform their doctor if they wish to become pregnant or   think they might be pregnant. There is a potential for serious side effects to an unborn child. Talk to  your health care professional or pharmacist for more information. Do not breast-feed an infant while taking this medicine. Men are advised not to father a child while receiving this medicine. What side effects may I notice from receiving this medicine? Side effects that you should report to your doctor or health care professional as soon as possible: -allergic reactions like skin rash, itching or hives, swelling of the face, lips, or tongue -low blood counts - This drug may decrease the number of white blood cells, red blood cells and platelets. You may be at increased risk for infections and bleeding. -signs of infection - fever or chills, cough, sore throat, pain or difficulty passing urine -signs of decreased platelets or bleeding - bruising, pinpoint red spots on the skin, black, tarry stools, nosebleeds -signs of decreased red blood cells - unusually weak or tired, fainting spells, lightheadedness -breathing problems -changes in vision -chest pain -high or low blood pressure -mouth sores -nausea and vomiting -pain, swelling, redness or irritation at the injection site -pain, tingling, numbness in the hands or feet -slow or irregular heartbeat -swelling of the ankle, feet, hands Side effects that usually do not require medical attention (report to your doctor or health care professional if they continue or are bothersome): -aches, pains -changes in the color of fingernails -diarrhea -hair loss -loss of appetite This list may not describe all possible side effects. Call your doctor for medical advice about side effects. You may report side effects to FDA at 1-800-FDA-1088. Where should I keep my medicine? This drug is given in a hospital or clinic and will not be stored at home. NOTE: This sheet is a summary. It may not cover all possible information. If you have questions about this medicine, talk to your doctor, pharmacist, or health care provider.    2016, Elsevier/Gold Standard.  (2013-01-29 16:48:50) [facility] Discharge Instructions for Patients Receiving Chemotherapy  Today you received the following chemotherapy agents:  Abraxane  To help prevent nausea and vomiting after your treatment, we encourage you to take your nausea medication.  If you develop nausea and vomiting that is not controlled by your nausea medication, call the clinic.   BELOW ARE SYMPTOMS THAT SHOULD BE REPORTED IMMEDIATELY:  *FEVER GREATER THAN 100.5 F  *CHILLS WITH OR WITHOUT FEVER  NAUSEA AND VOMITING THAT IS NOT CONTROLLED WITH YOUR NAUSEA MEDICATION  *UNUSUAL SHORTNESS OF BREATH  *UNUSUAL BRUISING OR BLEEDING  TENDERNESS IN MOUTH AND THROAT WITH OR WITHOUT PRESENCE OF ULCERS  *URINARY PROBLEMS  *BOWEL PROBLEMS  UNUSUAL RASH Items with * indicate a potential emergency and should be followed up as soon as possible.  Feel free to call the clinic you have any questions or concerns. The clinic phone number is [phone].  Please show the [facility] at check-in to the Emergency Department and triage nurse.

## 2016-01-16 NOTE — Telephone Encounter (Signed)
Per MD request this RN attempted to contact ES in managed care per new orders written 1/26 for Abraxane due to noted progression per PET.  MD seeing pt this am and pt is scheduled for therapy today.  Of note pt has requested no results stating " progression or increase in disease or results of tumor marker "  Pt wants verbalization to state " need to change things up at this time "  Or discussion by MD only regarding diagnosis and concerns.  Obtained VM for ES - message left.  This RN then called pharmacy and spoke with Arbie Cookey. Arbie Cookey reviewed chart and noted per ES Abraxane is approved.  This RN then spoke with nurse who will be treating pt - discussed pt's preference of verbalization with RN stating known patient and she is aware of communication concerns.  No further needs at this time.

## 2016-01-17 LAB — CA 125: Cancer Antigen (CA) 125: 3224 U/mL — ABNORMAL HIGH (ref 0.0–38.1)

## 2016-01-18 ENCOUNTER — Other Ambulatory Visit: Payer: Self-pay | Admitting: Oncology

## 2016-01-19 ENCOUNTER — Other Ambulatory Visit: Payer: Self-pay | Admitting: Oncology

## 2016-01-19 LAB — CANCER ANTIGEN 125 (PARALLEL TESTING): CA 125: 5321 U/mL — AB (ref <35)

## 2016-01-20 ENCOUNTER — Telehealth: Payer: Self-pay | Admitting: *Deleted

## 2016-01-20 NOTE — Telephone Encounter (Signed)
Per staff message from MD and scheduler I have scheduled appts. Scheduler advised of appts.  JMW

## 2016-01-30 ENCOUNTER — Other Ambulatory Visit (HOSPITAL_BASED_OUTPATIENT_CLINIC_OR_DEPARTMENT_OTHER): Payer: Medicare Other

## 2016-01-30 ENCOUNTER — Ambulatory Visit (HOSPITAL_BASED_OUTPATIENT_CLINIC_OR_DEPARTMENT_OTHER): Payer: Medicare Other | Admitting: Nurse Practitioner

## 2016-01-30 ENCOUNTER — Encounter: Payer: Self-pay | Admitting: Nurse Practitioner

## 2016-01-30 ENCOUNTER — Encounter: Payer: Self-pay | Admitting: Oncology

## 2016-01-30 ENCOUNTER — Ambulatory Visit (HOSPITAL_BASED_OUTPATIENT_CLINIC_OR_DEPARTMENT_OTHER): Payer: Medicare Other

## 2016-01-30 VITALS — BP 161/76 | HR 79 | Temp 97.5°F | Resp 20 | Ht 59.0 in | Wt 117.6 lb

## 2016-01-30 DIAGNOSIS — C772 Secondary and unspecified malignant neoplasm of intra-abdominal lymph nodes: Secondary | ICD-10-CM

## 2016-01-30 DIAGNOSIS — C787 Secondary malignant neoplasm of liver and intrahepatic bile duct: Secondary | ICD-10-CM | POA: Diagnosis not present

## 2016-01-30 DIAGNOSIS — Z5111 Encounter for antineoplastic chemotherapy: Secondary | ICD-10-CM

## 2016-01-30 DIAGNOSIS — D702 Other drug-induced agranulocytosis: Secondary | ICD-10-CM

## 2016-01-30 DIAGNOSIS — C569 Malignant neoplasm of unspecified ovary: Secondary | ICD-10-CM

## 2016-01-30 DIAGNOSIS — D709 Neutropenia, unspecified: Secondary | ICD-10-CM | POA: Diagnosis not present

## 2016-01-30 DIAGNOSIS — Z599 Problem related to housing and economic circumstances, unspecified: Secondary | ICD-10-CM

## 2016-01-30 DIAGNOSIS — Z598 Other problems related to housing and economic circumstances: Secondary | ICD-10-CM

## 2016-01-30 LAB — CBC WITH DIFFERENTIAL/PLATELET
BASO%: 0.8 % (ref 0.0–2.0)
Basophils Absolute: 0 10*3/uL (ref 0.0–0.1)
EOS%: 0.4 % (ref 0.0–7.0)
Eosinophils Absolute: 0 10*3/uL (ref 0.0–0.5)
HCT: 33.2 % — ABNORMAL LOW (ref 34.8–46.6)
HGB: 10.6 g/dL — ABNORMAL LOW (ref 11.6–15.9)
LYMPH%: 30.3 % (ref 14.0–49.7)
MCH: 31.3 pg (ref 25.1–34.0)
MCHC: 32 g/dL (ref 31.5–36.0)
MCV: 97.8 fL (ref 79.5–101.0)
MONO#: 0.4 10*3/uL (ref 0.1–0.9)
MONO%: 19.6 % — AB (ref 0.0–14.0)
NEUT%: 48.9 % (ref 38.4–76.8)
NEUTROS ABS: 1 10*3/uL — AB (ref 1.5–6.5)
Platelets: 57 10*3/uL — ABNORMAL LOW (ref 145–400)
RBC: 3.4 10*6/uL — AB (ref 3.70–5.45)
RDW: 18.8 % — ABNORMAL HIGH (ref 11.2–14.5)
WBC: 2 10*3/uL — AB (ref 3.9–10.3)
lymph#: 0.6 10*3/uL — ABNORMAL LOW (ref 0.9–3.3)

## 2016-01-30 LAB — COMPREHENSIVE METABOLIC PANEL
ALT: 19 U/L (ref 0–55)
AST: 27 U/L (ref 5–34)
Albumin: 3.8 g/dL (ref 3.5–5.0)
Alkaline Phosphatase: 103 U/L (ref 40–150)
Anion Gap: 10 mEq/L (ref 3–11)
BILIRUBIN TOTAL: 0.73 mg/dL (ref 0.20–1.20)
BUN: 17.4 mg/dL (ref 7.0–26.0)
CHLORIDE: 107 meq/L (ref 98–109)
CO2: 22 meq/L (ref 22–29)
Calcium: 9.2 mg/dL (ref 8.4–10.4)
Creatinine: 0.8 mg/dL (ref 0.6–1.1)
EGFR: 70 mL/min/{1.73_m2} — AB (ref >90)
GLUCOSE: 117 mg/dL (ref 70–140)
Potassium: 3.8 mEq/L (ref 3.5–5.1)
SODIUM: 138 meq/L (ref 136–145)
TOTAL PROTEIN: 6.8 g/dL (ref 6.4–8.3)

## 2016-01-30 MED ORDER — SODIUM CHLORIDE 0.9 % IV SOLN
Freq: Once | INTRAVENOUS | Status: AC
  Administered 2016-01-30: 13:00:00 via INTRAVENOUS

## 2016-01-30 MED ORDER — PROCHLORPERAZINE MALEATE 10 MG PO TABS
ORAL_TABLET | ORAL | Status: AC
Start: 2016-01-30 — End: 2016-01-30
  Filled 2016-01-30: qty 1

## 2016-01-30 MED ORDER — PROCHLORPERAZINE MALEATE 10 MG PO TABS
10.0000 mg | ORAL_TABLET | Freq: Once | ORAL | Status: AC
  Administered 2016-01-30: 10 mg via ORAL

## 2016-01-30 MED ORDER — HEPARIN SOD (PORK) LOCK FLUSH 100 UNIT/ML IV SOLN
500.0000 [IU] | Freq: Once | INTRAVENOUS | Status: AC | PRN
  Administered 2016-01-30: 500 [IU]
  Filled 2016-01-30: qty 5

## 2016-01-30 MED ORDER — SODIUM CHLORIDE 0.9% FLUSH
10.0000 mL | INTRAVENOUS | Status: DC | PRN
  Administered 2016-01-30: 10 mL
  Filled 2016-01-30: qty 10

## 2016-01-30 MED ORDER — PACLITAXEL PROTEIN-BOUND CHEMO INJECTION 100 MG
100.0000 mg/m2 | Freq: Once | INTRAVENOUS | Status: AC
  Administered 2016-01-30: 150 mg via INTRAVENOUS
  Filled 2016-01-30: qty 30

## 2016-01-30 MED ORDER — PEGFILGRASTIM 6 MG/0.6ML ~~LOC~~ PSKT
6.0000 mg | PREFILLED_SYRINGE | Freq: Once | SUBCUTANEOUS | Status: AC
  Administered 2016-01-30: 6 mg via SUBCUTANEOUS
  Filled 2016-01-30: qty 0.6

## 2016-01-30 NOTE — Progress Notes (Signed)
Ok to treat per Verneita Griffes, NP office note.  Pt to receive onpro.

## 2016-01-30 NOTE — Progress Notes (Signed)
Okay to tx per Nira Conn B NP with ANC 1.0 and platelets 57.

## 2016-01-30 NOTE — Progress Notes (Signed)
Enrolled pt in the Patient Cassandra Nichols. Pt is approved for drugs associated w/ her Dx from 01/30/16 to 01/29/17 w/ a 6 month look back period for $3,500.  I will send copy of approval letter and POE to [facility] in billing.

## 2016-01-30 NOTE — Progress Notes (Signed)
ID: Cassandra Nichols   DOB: [birth year]  MR#: [id number]  [id number]   PCP:  Marjorie Smolder, MD GYN:  [name], MD OTHER:  Cassandra Peach, MD  CHIEF COMPLAINT:  Recurrent Ovarian Cancer  CURRENT TREATMENT:  Abraxane  NOTE: THIS PATIENT REQUESTS THAT ABSOLUTELY NO NUMERIC INFORMATION BE GIVEN REGARDING ANY LAB AND PARTICULARLY DOES NOT WANT TO KNOW SPECIFIC CA-125 RESULTS  HISTORY OF PRESENT ILLNESS: From the earlier summary note:   Cassandra Nichols was initially diagnosed with ovarian cancer in July of 2002. She underwent debulking surgery at that time for what proved to be a 4 of 18 lymph nodes positive disease. She was initially treated with 6 cycles of paclitaxel and carboplatin adjuvantly, completed in December of 2002.  Patient has had multiple recurrences since December of 2002, and has been treated with multiple agents, as detailed below.  INTERVAL HISTORY: Cassandra Nichols returns today for follow up of her recurrent ovarian cancer. Today she is due for cycle 2 of abraxane, given every 14 days.  REVIEW OF SYSTEMS: Cassandra Nichols had a good first experience with abraxane. She denies fevers, chills, nausea, or vomiting. She did not need any of her antiemetic meds. She is moving her bowels fairly well with no pain or abdominal distention. She denies mouth sores or thrush. She always has some degree of oral tenderness due to her history of periodontal disease. Her appetite is good. She has no drop in her energy level. A detailed review of systems is otherwise stable.   PAST MEDICAL HISTORY: Past Medical History  Diagnosis Date  . Shingles   . 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 Ovarian cancer ([place]) 11/17/2011  . Cancer [facility])     PAST SURGICAL HISTORY: Past Surgical History  Procedure Laterality Date  . Ganglion cyst excision Left     Wrist    FAMILY HISTORY Family History  Problem Relation Age of Onset  . Hypertension Mother   . GER disease Mother   . Cancer Father     Cancer (stomach)    the patient's father died at the age of 23, from cancer that may have it started in his abdomen. He had 8 siblings including 2 sisters with breast cancer. The patient's mother had by Dr. cancer, but died from unrelated causes at the age of 49. The patient's maternal grandmother may have had ovarian cancer. The patient is an only child   GYNECOLOGIC HISTORY: Menarche age 75, first live birth age 75. The patient is GX P1. She went through the change of life around age 57. She subsequently had a TAH/BSO was part of her ovarian cancer treatment.  SOCIAL HISTORY: (Updated 03/08/2014) Cassandra Nichols lives alone. She works temporary jobs, primarily Nutritional therapist.  Her (adopted) daughter Cassandra Nichols lives [place] and is Mudlogger of the epidemiology and [place] there; she is married with 2 children. Son  Cassandra Nichols lives in [place], [place]. He has 4 children of his own. He is Tree surgeon for a [employer]. His wife is a Marine scientist   ADVANCED DIRECTIVES: At the 01/16/2016 visit Cassandra Nichols tells me that her son Cassandra Nichols is her healthcare power of attorney. He can be reached at [phone]. [Her daughter Cassandra Nichols is her financial power of attorney.]  HEALTH MAINTENANCE:  (Updated  03/08/2014) Social History  Substance Use Topics  . Smoking status: Former Research scientist (life sciences)  . Smokeless tobacco: Never Used  . Alcohol Use: No     Comment: Occasionally     Colonoscopy:  PAP: s/p hysterectomy BSO  Bone density:   Mammogram:  09/16/2015   Allergies  Allergen Reactions  . Penicillins Rash    Current Outpatient Prescriptions  Medication Sig Dispense Refill  . Alum & Mag Hydroxide-Simeth (MAGIC MOUTHWASH W/LIDOCAINE) SOLN Take 5 mLs by mouth 4 (four) times daily as needed. 240 mL 2  . dexamethasone (DECADRON) 4 MG tablet STARTING THE DAY AFTER CHEMO, TAKE 2 TABLETS BY MOUTH TWICE DAILY FOR 3 DAYS 30 tablet 0  . hydrochlorothiazide (MICROZIDE) 12.5 MG capsule TAKE 1 CAPSULE EVERY DAY 30 capsule 3  . lisinopril  (PRINIVIL,ZESTRIL) 20 MG tablet TAKE 1 TABLET BY MOUTH DAILY 30 tablet 0  . loratadine (CLARITIN) 10 MG tablet Take 10 mg by mouth daily as needed for allergies.    . Melatonin 3 MG TABS Take 3 mg by mouth at bedtime as needed.    . ondansetron (ZOFRAN) 8 MG tablet Take 1 tablet (8 mg total) by mouth 2 (two) times daily. Take for 3 days after chemotherapy then as needed 30 tablet 6  . prochlorperazine (COMPAZINE) 10 MG tablet Take 1 tablet (10 mg total) by mouth every 6 (six) hours as needed for nausea or vomiting. 30 tablet 6   No current facility-administered medications for this visit.   Facility-Administered Medications Ordered in Other Visits  Medication Dose Route Frequency Provider Last Rate Last Dose  . sodium chloride 0.9 % injection 10 mL  10 mL Intravenous PRN Chauncey Cruel, MD   10 mL at 05/24/14 1601    OBJECTIVE: Middle-aged white Nichols who appears stated age 75 Vitals:   01/30/16 1055  BP: 161/76  Pulse: 79  Temp: 97.5 F (36.4 C)  Resp: 20     Body mass index is 23.74 kg/(m^2).    ECOG FS: 1  Skin: warm, dry  HEENT: sclerae anicteric, conjunctivae pink, oropharynx clear. No thrush or mucositis.  Lymph Nodes: No cervical or supraclavicular lymphadenopathy  Lungs: clear to auscultation bilaterally, no rales, wheezes, or rhonci  Heart: regular rate and rhythm  Abdomen: round, soft, non tender, positive bowel sounds  Musculoskeletal: No focal spinal tenderness, no peripheral edema  Neuro: non focal, well oriented, positive affect  Breasts: deferred  LAB RESULTS:  Lab Results  Component Value Date   WBC 2.0* 01/30/2016   NEUTROABS 1.0* 01/30/2016   HGB 10.6* 01/30/2016   HCT 33.2* 01/30/2016   MCV 97.8 01/30/2016   PLT 57* 01/30/2016        Chemistry      Component Value Date/Time   NA 138 01/30/2016 1037   NA 140 07/04/2012 0832   K 3.8 01/30/2016 1037   K 4.2 07/04/2012 0832   CL 106 05/09/2013 1304   CL 106 07/04/2012 0832   CO2 22  01/30/2016 1037   CO2 26 07/04/2012 0832   BUN 17.4 01/30/2016 1037   BUN 17 07/04/2012 0832   CREATININE 0.8 01/30/2016 1037   CREATININE 0.82 07/04/2012 0832      Component Value Date/Time   CALCIUM 9.2 01/30/2016 1037   CALCIUM 9.2 07/04/2012 0832   ALKPHOS 103 01/30/2016 1037   ALKPHOS 71 07/04/2012 0832   AST 27 01/30/2016 1037   AST 18 07/04/2012 0832   ALT 19 01/30/2016 1037   ALT 12 07/04/2012 0832   BILITOT 0.73 01/30/2016 1037   BILITOT 0.7 07/04/2012 0832     Results for MAUI, CAUDILLO (MRN [id number]) as of 01/30/2016 12:03  Ref. Range 08/29/2015 14:28 10/10/2015 11:33 11/21/2015 14:36  12/26/2015 13:46 01/16/2016 10:49  CA 125 Latest Ref Range: <35 U/mL 3215 (H) 3253 (H) 4261 (H) 4121 (H) 5321 (H)    STUDIES: Nm Pet Image Restag (ps) Skull Base To Thigh  01/15/2016  CLINICAL DATA:  Subsequent treatment strategy for ovarian carcinoma. EXAM: NUCLEAR MEDICINE PET SKULL BASE TO THIGH TECHNIQUE: 5.8 mCi F-18 FDG was injected intravenously. Full-ring PET imaging was performed from the skull base to thigh after the radiotracer. CT data was obtained and used for attenuation correction and anatomic localization. FASTING BLOOD GLUCOSE:  Value: 92 mg/dl COMPARISON:  09/12/2015 FINDINGS: NECK No hypermetabolic lymph nodes identified within the soft tissues of the neck. CHEST No hypermetabolic mediastinal or hilar nodes. Small right internal mammary lymph node measures 6 mm and has an SUV max equal to 2.45. No suspicious pulmonary nodules on the CT scan. ABDOMEN/PELVIS Hypermetabolic tumor within the right lobe of liver measures 3.2 cm and has an SUV max equal to 12.7. No abnormal hypermetabolic activity within the pancreas, adrenal glands, or spleen. Multiple hypermetabolic upper abdominal lymph nodes are identified. Index lymph node within the porta hepatis measures 2 cm and has an SUV max equal to 9.29. New from previous exam. Persistent gastrohepatic ligament lymph nodes are identified.  Currently 2.5 cm with an SUV max equal to 8.08. Index left periaortic node measures 7 mm and has an SUV max equal to 5.8. Unchanged in size from previous CT. There is a peritoneal deposit within the posterior pelvis which measures 1.9 cm and has an SUV max equal to 7.07. This is unchanged in size from previous CT. Peritoneal deposit within the left posterior pelvis measures 1.5 cm and has an SUV max equal to 6.4. SKELETON No focal hypermetabolic activity to suggest skeletal metastasis. IMPRESSION: 1. Hypermetabolic liver metastasis identified. The index lesion appears increased in size from previous CT. 2. Persistent upper abdominal adenopathy which exhibits malignant range FDG uptake on the PET-CT. When compared with the previous exam there is a new area of hypermetabolic adenopathy within the porta hepatis region. 3. No significant change in size of currently hypermetabolic peritoneal deposits within the pelvis. 4. There is a tiny right internal mammary lymph node which exhibits mild FDG uptake, nonspecific. Attention on follow-up imaging advise. 5. Electronically Signed   By: Kerby Moors M.D.   On: 01/15/2016 12:29     ASSESSMENT: 75 y.o.  Cassandra Nichols (1)  with a history of ovarian cancer dating back to July 2002 when she underwent debulking surgery for what proved to be 4/18 lymph node positive disease.   (2)  She received 6 cycles of paclitaxel/carboplatin adjuvantly completed in December 2002.   (3)  She had her first recurrence in December 2005 treated with paclitaxel;   (4)  second recurrence May 2007 treated with [place] and paclitaxel;   (5)  third recurrence January 2010 treated with topotecan and Gemzar;   (6)  fourth recurrence January 2012 treated with single-agent carboplatin (January to August 2012) with initial response, then plateau; with progression January 2013 (less than 6 months after last carbo dose) treated with single-agent gemzar given Q2 weeks (January to April 2013),  with evidence of progression  (7) Doxil strartedJuly 2013 6, with evidence of initial response, and fair tolerance. Held between May and August 2014 due to patient's work schedule and scheduling issues.  Resumed treatment as of 08/24/2013, with some delays secondary to the patient's schedule. As of early December 2014, her total cumulative dose of Doxil reached 560 mg per  meter square. In addition there was also an upward trend of the CA 125. Doxil was discontinued (last dose 10/30/2013). Echocardiogram on 11/14/2013 showed a well preserved ejection fraction of 55-60%.  (8)  treated with single agent paclitaxel, given on days one and 8 of each 21 day cycle, first dose on 12/27/2013,with continuing response, but mild to moderate toxicity; 11th and last dose 04/12/2014   (9) carboplatin and cyclophosphamide started on 08/28/14 given every 21 days-- stopped 10/10/2014 after a reaction to carboplatin (also no evidence of response)  (10) cyclophosphamide and bevacizumab started 12/02/2014, repeated every 21 days  (a) CT scan of the abdomen and pelvis 06/19/2015 shows stable disease  (b) CT of the chest/abd/pelvis 09/12/2015 stable  (c) PET scan 01/15/2016 shows progression in the liver and upper abdominal nodes  (11) abraxane started 01/16/2016, to be repeated every 14 days  (12) offered genetics testing at the 01/16/2016 visit  PLAN:  Cassandra Nichols tolerated her first cycle of abraxane well, with no issues. The only change I am making to her regimen, is adding neulasta onpro to keep her Cassandra Nichols afloat per Dr. Virgie Dad instruction. Today it is down to 1.0. I explained this addition to Cassandra Nichols in basic form, as she prefers not to speak about the specifics of her lab work. She understands that this will "boost" her immune system and allow her to keep her her [place] at a level where we can treat her on time. She is agreeable to this. She knows the main side effect will be bone pain. She has been advised to take  claritin daily for 3 days after the injection. She may use tylenol for any residual pain.   Cassandra Nichols would also like her follow up schedule to be amended. Due to financial hardships she is unable to afford the co-pay to see Dr. Jana Hakim or myself with every treatment visit as we would have done for other patients. In addition to this, she would have to take off a more hours from work to accommodate an office visit. We were able to negotiate a follow up visit every 6 weeks instead, which ends up begin every 3rd treatment. She understands to alert her nurse of any changes or concern about how she is tolerating this regimen. She of course knows a visit may be added back in at any time if she needs it. We would have also repeated scans after the 6th cycle of abraxane, but she would like this pushed back to after the 8th cycle, landing in May. We can always follow her CA 125 in the meantime.   Tyne Melott will continue abraxane every 2 weeks. Her next follow up visit will be with me on 3/24. She understands and agrees with this plan. She knows the goal of treatment in her case is control. She has been encouraged to call with any issues that might arise before her next visit here.   Laurie Panda, NP    01/30/2016

## 2016-01-30 NOTE — Patient Instructions (Signed)
[facility] Discharge Instructions for Patients Receiving Chemotherapy  Today you received the following chemotherapy agents abraxane  To help prevent nausea and vomiting after your treatment, we encourage you to take your nausea medication as directed   If you develop nausea and vomiting that is not controlled by your nausea medication, call the clinic.   BELOW ARE SYMPTOMS THAT SHOULD BE REPORTED IMMEDIATELY:  *FEVER GREATER THAN 100.5 F  *CHILLS WITH OR WITHOUT FEVER  NAUSEA AND VOMITING THAT IS NOT CONTROLLED WITH YOUR NAUSEA MEDICATION  *UNUSUAL SHORTNESS OF BREATH  *UNUSUAL BRUISING OR BLEEDING  TENDERNESS IN MOUTH AND THROAT WITH OR WITHOUT PRESENCE OF ULCERS  *URINARY PROBLEMS  *BOWEL PROBLEMS  UNUSUAL RASH Items with * indicate a potential emergency and should be followed up as soon as possible.  Feel free to call the clinic you have any questions or concerns. The clinic phone number is [phone].  Please show the [place] at check-in to the Emergency Department and triage nurse.

## 2016-02-02 ENCOUNTER — Telehealth: Payer: Self-pay

## 2016-02-02 NOTE — Telephone Encounter (Signed)
Called and left a message with new appointments per pof °

## 2016-02-03 ENCOUNTER — Encounter: Payer: Self-pay | Admitting: Oncology

## 2016-02-03 NOTE — Progress Notes (Signed)
Pt is approved for the [facility] and [employer] grants.  °

## 2016-02-06 ENCOUNTER — Other Ambulatory Visit: Payer: Medicare Other

## 2016-02-06 ENCOUNTER — Ambulatory Visit: Payer: Medicare Other

## 2016-02-10 ENCOUNTER — Telehealth: Payer: Self-pay | Admitting: *Deleted

## 2016-02-10 NOTE — Telephone Encounter (Signed)
This RN received VM from pt stating she received a denial for payment for services obtained on 01/16/2016.  Pt will be in Friday and can bring denial letter with her at that time.  Per message Mackinze Kopec is hoping this office can clarify above with insurance and render payment appropriately.  This RN returned call per instructions by [name] to leave a message on her answering machine.  This RN left a message on answering machine and informed patient her concern is being forwarded to our managed care department as well as to bring letter of denial with her this Friday.  This note will be forwarded to Darlena in managed care.

## 2016-02-12 ENCOUNTER — Other Ambulatory Visit: Payer: Self-pay | Admitting: Nurse Practitioner

## 2016-02-13 ENCOUNTER — Other Ambulatory Visit (HOSPITAL_BASED_OUTPATIENT_CLINIC_OR_DEPARTMENT_OTHER): Payer: Medicare Other

## 2016-02-13 ENCOUNTER — Other Ambulatory Visit: Payer: Medicare Other

## 2016-02-13 ENCOUNTER — Ambulatory Visit (HOSPITAL_BASED_OUTPATIENT_CLINIC_OR_DEPARTMENT_OTHER): Payer: Medicare Other

## 2016-02-13 ENCOUNTER — Ambulatory Visit: Payer: Medicare Other | Admitting: Nurse Practitioner

## 2016-02-13 ENCOUNTER — Ambulatory Visit: Payer: Medicare Other

## 2016-02-13 VITALS — BP 143/70 | HR 75 | Temp 98.0°F | Resp 18

## 2016-02-13 DIAGNOSIS — C569 Malignant neoplasm of unspecified ovary: Secondary | ICD-10-CM

## 2016-02-13 DIAGNOSIS — Z5111 Encounter for antineoplastic chemotherapy: Secondary | ICD-10-CM | POA: Diagnosis not present

## 2016-02-13 DIAGNOSIS — C772 Secondary and unspecified malignant neoplasm of intra-abdominal lymph nodes: Secondary | ICD-10-CM

## 2016-02-13 DIAGNOSIS — C787 Secondary malignant neoplasm of liver and intrahepatic bile duct: Secondary | ICD-10-CM

## 2016-02-13 LAB — COMPREHENSIVE METABOLIC PANEL
ALBUMIN: 3.6 g/dL (ref 3.5–5.0)
ALK PHOS: 111 U/L (ref 40–150)
ALT: 15 U/L (ref 0–55)
AST: 21 U/L (ref 5–34)
Anion Gap: 8 mEq/L (ref 3–11)
BILIRUBIN TOTAL: 0.58 mg/dL (ref 0.20–1.20)
BUN: 23 mg/dL (ref 7.0–26.0)
CO2: 23 meq/L (ref 22–29)
CREATININE: 0.8 mg/dL (ref 0.6–1.1)
Calcium: 8.9 mg/dL (ref 8.4–10.4)
Chloride: 107 mEq/L (ref 98–109)
EGFR: 70 mL/min/{1.73_m2} — AB (ref >90)
GLUCOSE: 89 mg/dL (ref 70–140)
Potassium: 4.2 mEq/L (ref 3.5–5.1)
SODIUM: 138 meq/L (ref 136–145)
TOTAL PROTEIN: 6.5 g/dL (ref 6.4–8.3)

## 2016-02-13 LAB — CBC WITH DIFFERENTIAL/PLATELET
BASO%: 0.3 % (ref 0.0–2.0)
Basophils Absolute: 0 10*3/uL (ref 0.0–0.1)
EOS ABS: 0 10*3/uL (ref 0.0–0.5)
EOS%: 0.2 % (ref 0.0–7.0)
HCT: 30.4 % — ABNORMAL LOW (ref 34.8–46.6)
HEMOGLOBIN: 9.8 g/dL — AB (ref 11.6–15.9)
LYMPH%: 11.9 % — AB (ref 14.0–49.7)
MCH: 31.4 pg (ref 25.1–34.0)
MCHC: 32.2 g/dL (ref 31.5–36.0)
MCV: 97.8 fL (ref 79.5–101.0)
MONO#: 0.4 10*3/uL (ref 0.1–0.9)
MONO%: 6.4 % (ref 0.0–14.0)
NEUT#: 4.9 10*3/uL (ref 1.5–6.5)
NEUT%: 81.2 % — ABNORMAL HIGH (ref 38.4–76.8)
Platelets: 57 10*3/uL — ABNORMAL LOW (ref 145–400)
RBC: 3.11 10*6/uL — AB (ref 3.70–5.45)
RDW: 19.4 % — AB (ref 11.2–14.5)
WBC: 6.1 10*3/uL (ref 3.9–10.3)
lymph#: 0.7 10*3/uL — ABNORMAL LOW (ref 0.9–3.3)

## 2016-02-13 MED ORDER — SODIUM CHLORIDE 0.9% FLUSH
10.0000 mL | INTRAVENOUS | Status: DC | PRN
  Administered 2016-02-13: 10 mL
  Filled 2016-02-13: qty 10

## 2016-02-13 MED ORDER — HEPARIN SOD (PORK) LOCK FLUSH 100 UNIT/ML IV SOLN
500.0000 [IU] | Freq: Once | INTRAVENOUS | Status: AC | PRN
  Administered 2016-02-13: 500 [IU]
  Filled 2016-02-13: qty 5

## 2016-02-13 MED ORDER — PEGFILGRASTIM 6 MG/0.6ML ~~LOC~~ PSKT
6.0000 mg | PREFILLED_SYRINGE | Freq: Once | SUBCUTANEOUS | Status: AC
  Administered 2016-02-13: 6 mg via SUBCUTANEOUS
  Filled 2016-02-13: qty 0.6

## 2016-02-13 MED ORDER — PROCHLORPERAZINE MALEATE 10 MG PO TABS
10.0000 mg | ORAL_TABLET | Freq: Once | ORAL | Status: AC
  Administered 2016-02-13: 10 mg via ORAL

## 2016-02-13 MED ORDER — PROCHLORPERAZINE MALEATE 10 MG PO TABS
ORAL_TABLET | ORAL | Status: AC
  Filled 2016-02-13: qty 1

## 2016-02-13 MED ORDER — PACLITAXEL PROTEIN-BOUND CHEMO INJECTION 100 MG
100.0000 mg/m2 | Freq: Once | INTRAVENOUS | Status: AC
  Administered 2016-02-13: 150 mg via INTRAVENOUS
  Filled 2016-02-13: qty 30

## 2016-02-13 MED ORDER — SODIUM CHLORIDE 0.9 % IV SOLN
Freq: Once | INTRAVENOUS | Status: AC
  Administered 2016-02-13: 16:00:00 via INTRAVENOUS

## 2016-02-13 NOTE — Progress Notes (Signed)
Per Dr. Jana Hakim- ok to treat with platelets 57.

## 2016-02-14 LAB — CA 125

## 2016-02-16 LAB — CANCER ANTIGEN 125 (PARALLEL TESTING): CA 125: 2381 U/mL — ABNORMAL HIGH (ref <35)

## 2016-02-24 ENCOUNTER — Other Ambulatory Visit: Payer: Self-pay | Admitting: Oncology

## 2016-02-27 ENCOUNTER — Other Ambulatory Visit: Payer: Self-pay | Admitting: *Deleted

## 2016-02-27 ENCOUNTER — Other Ambulatory Visit (HOSPITAL_BASED_OUTPATIENT_CLINIC_OR_DEPARTMENT_OTHER): Payer: Medicare Other

## 2016-02-27 ENCOUNTER — Ambulatory Visit (HOSPITAL_BASED_OUTPATIENT_CLINIC_OR_DEPARTMENT_OTHER): Payer: Medicare Other

## 2016-02-27 VITALS — BP 137/62 | HR 87 | Temp 97.6°F

## 2016-02-27 DIAGNOSIS — C787 Secondary malignant neoplasm of liver and intrahepatic bile duct: Secondary | ICD-10-CM

## 2016-02-27 DIAGNOSIS — C772 Secondary and unspecified malignant neoplasm of intra-abdominal lymph nodes: Secondary | ICD-10-CM

## 2016-02-27 DIAGNOSIS — C569 Malignant neoplasm of unspecified ovary: Secondary | ICD-10-CM

## 2016-02-27 DIAGNOSIS — Z5111 Encounter for antineoplastic chemotherapy: Secondary | ICD-10-CM | POA: Diagnosis not present

## 2016-02-27 LAB — CBC WITH DIFFERENTIAL/PLATELET
BASO%: 0.3 % (ref 0.0–2.0)
Basophils Absolute: 0 10*3/uL (ref 0.0–0.1)
EOS ABS: 0 10*3/uL (ref 0.0–0.5)
EOS%: 0.1 % (ref 0.0–7.0)
HCT: 32.4 % — ABNORMAL LOW (ref 34.8–46.6)
HGB: 10.4 g/dL — ABNORMAL LOW (ref 11.6–15.9)
LYMPH%: 8.3 % — ABNORMAL LOW (ref 14.0–49.7)
MCH: 31.9 pg (ref 25.1–34.0)
MCHC: 32.1 g/dL (ref 31.5–36.0)
MCV: 99.2 fL (ref 79.5–101.0)
MONO#: 0.7 10*3/uL (ref 0.1–0.9)
MONO%: 6.6 % (ref 0.0–14.0)
NEUT#: 8.4 10*3/uL — ABNORMAL HIGH (ref 1.5–6.5)
NEUT%: 84.7 % — AB (ref 38.4–76.8)
PLATELETS: 88 10*3/uL — AB (ref 145–400)
RBC: 3.27 10*6/uL — ABNORMAL LOW (ref 3.70–5.45)
RDW: 19.8 % — ABNORMAL HIGH (ref 11.2–14.5)
WBC: 9.9 10*3/uL (ref 3.9–10.3)
lymph#: 0.8 10*3/uL — ABNORMAL LOW (ref 0.9–3.3)

## 2016-02-27 LAB — COMPREHENSIVE METABOLIC PANEL
ALT: 15 U/L (ref 0–55)
ANION GAP: 11 meq/L (ref 3–11)
AST: 23 U/L (ref 5–34)
Albumin: 3.7 g/dL (ref 3.5–5.0)
Alkaline Phosphatase: 114 U/L (ref 40–150)
BUN: 20.6 mg/dL (ref 7.0–26.0)
CHLORIDE: 106 meq/L (ref 98–109)
CO2: 22 meq/L (ref 22–29)
Calcium: 9.1 mg/dL (ref 8.4–10.4)
Creatinine: 0.9 mg/dL (ref 0.6–1.1)
EGFR: 62 mL/min/{1.73_m2} — AB (ref >90)
Glucose: 114 mg/dl (ref 70–140)
POTASSIUM: 4.6 meq/L (ref 3.5–5.1)
Sodium: 139 mEq/L (ref 136–145)
Total Bilirubin: 0.46 mg/dL (ref 0.20–1.20)
Total Protein: 6.5 g/dL (ref 6.4–8.3)

## 2016-02-27 MED ORDER — PEGFILGRASTIM 6 MG/0.6ML ~~LOC~~ PSKT
6.0000 mg | PREFILLED_SYRINGE | Freq: Once | SUBCUTANEOUS | Status: AC
  Administered 2016-02-27: 6 mg via SUBCUTANEOUS
  Filled 2016-02-27: qty 0.6

## 2016-02-27 MED ORDER — PROCHLORPERAZINE MALEATE 10 MG PO TABS
10.0000 mg | ORAL_TABLET | Freq: Once | ORAL | Status: AC
  Administered 2016-02-27: 10 mg via ORAL

## 2016-02-27 MED ORDER — SODIUM CHLORIDE 0.9% FLUSH
10.0000 mL | INTRAVENOUS | Status: DC | PRN
  Administered 2016-02-27: 10 mL
  Filled 2016-02-27: qty 10

## 2016-02-27 MED ORDER — PROCHLORPERAZINE MALEATE 10 MG PO TABS
ORAL_TABLET | ORAL | Status: AC
  Filled 2016-02-27: qty 1

## 2016-02-27 MED ORDER — HEPARIN SOD (PORK) LOCK FLUSH 100 UNIT/ML IV SOLN
500.0000 [IU] | Freq: Once | INTRAVENOUS | Status: AC | PRN
  Administered 2016-02-27: 500 [IU]
  Filled 2016-02-27: qty 5

## 2016-02-27 MED ORDER — PACLITAXEL PROTEIN-BOUND CHEMO INJECTION 100 MG
100.0000 mg/m2 | Freq: Once | INTRAVENOUS | Status: AC
  Administered 2016-02-27: 150 mg via INTRAVENOUS
  Filled 2016-02-27: qty 30

## 2016-02-27 MED ORDER — SODIUM CHLORIDE 0.9 % IV SOLN
Freq: Once | INTRAVENOUS | Status: AC
  Administered 2016-02-27: 16:00:00 via INTRAVENOUS

## 2016-02-27 NOTE — Patient Instructions (Signed)
[facility] Discharge Instructions for Patients Receiving Chemotherapy  Today you received the following chemotherapy agents Abraxane To help prevent nausea and vomiting after your treatment, we encourage you to take your nausea medication as prescribed.   If you develop nausea and vomiting that is not controlled by your nausea medication, call the clinic.   BELOW ARE SYMPTOMS THAT SHOULD BE REPORTED IMMEDIATELY:  *FEVER GREATER THAN 100.5 F  *CHILLS WITH OR WITHOUT FEVER  NAUSEA AND VOMITING THAT IS NOT CONTROLLED WITH YOUR NAUSEA MEDICATION  *UNUSUAL SHORTNESS OF BREATH  *UNUSUAL BRUISING OR BLEEDING  TENDERNESS IN MOUTH AND THROAT WITH OR WITHOUT PRESENCE OF ULCERS  *URINARY PROBLEMS  *BOWEL PROBLEMS  UNUSUAL RASH Items with * indicate a potential emergency and should be followed up as soon as possible.  Feel free to call the clinic you have any questions or concerns. The clinic phone number is [phone].  Please show the CHEMO ALERT CARD at check-in to the Emergency Department and triage nurse.   

## 2016-02-27 NOTE — Progress Notes (Signed)
Ok to treat per Dr. Magrinat 

## 2016-03-12 ENCOUNTER — Ambulatory Visit (HOSPITAL_BASED_OUTPATIENT_CLINIC_OR_DEPARTMENT_OTHER): Payer: Medicare Other

## 2016-03-12 ENCOUNTER — Telehealth: Payer: Self-pay | Admitting: Nurse Practitioner

## 2016-03-12 ENCOUNTER — Other Ambulatory Visit: Payer: Medicare Other

## 2016-03-12 ENCOUNTER — Encounter: Payer: Self-pay | Admitting: Nurse Practitioner

## 2016-03-12 ENCOUNTER — Ambulatory Visit (HOSPITAL_BASED_OUTPATIENT_CLINIC_OR_DEPARTMENT_OTHER): Payer: Medicare Other | Admitting: Nurse Practitioner

## 2016-03-12 ENCOUNTER — Other Ambulatory Visit: Payer: Self-pay | Admitting: Oncology

## 2016-03-12 ENCOUNTER — Other Ambulatory Visit (HOSPITAL_BASED_OUTPATIENT_CLINIC_OR_DEPARTMENT_OTHER): Payer: Medicare Other

## 2016-03-12 VITALS — BP 145/68 | HR 96 | Temp 97.6°F | Resp 18 | Ht 59.0 in | Wt 116.2 lb

## 2016-03-12 DIAGNOSIS — C569 Malignant neoplasm of unspecified ovary: Secondary | ICD-10-CM

## 2016-03-12 DIAGNOSIS — Z5111 Encounter for antineoplastic chemotherapy: Secondary | ICD-10-CM | POA: Diagnosis not present

## 2016-03-12 DIAGNOSIS — C772 Secondary and unspecified malignant neoplasm of intra-abdominal lymph nodes: Secondary | ICD-10-CM | POA: Diagnosis not present

## 2016-03-12 DIAGNOSIS — C787 Secondary malignant neoplasm of liver and intrahepatic bile duct: Secondary | ICD-10-CM | POA: Diagnosis not present

## 2016-03-12 DIAGNOSIS — K5909 Other constipation: Secondary | ICD-10-CM | POA: Diagnosis not present

## 2016-03-12 DIAGNOSIS — D696 Thrombocytopenia, unspecified: Secondary | ICD-10-CM

## 2016-03-12 LAB — CBC WITH DIFFERENTIAL/PLATELET
BASO%: 0.2 % (ref 0.0–2.0)
BASOS ABS: 0 10*3/uL (ref 0.0–0.1)
EOS ABS: 0 10*3/uL (ref 0.0–0.5)
EOS%: 0.1 % (ref 0.0–7.0)
HEMATOCRIT: 32 % — AB (ref 34.8–46.6)
HEMOGLOBIN: 10.3 g/dL — AB (ref 11.6–15.9)
LYMPH#: 1.1 10*3/uL (ref 0.9–3.3)
LYMPH%: 11.3 % — ABNORMAL LOW (ref 14.0–49.7)
MCH: 32.3 pg (ref 25.1–34.0)
MCHC: 32.1 g/dL (ref 31.5–36.0)
MCV: 100.6 fL (ref 79.5–101.0)
MONO#: 0.7 10*3/uL (ref 0.1–0.9)
MONO%: 6.9 % (ref 0.0–14.0)
NEUT#: 8.2 10*3/uL — ABNORMAL HIGH (ref 1.5–6.5)
NEUT%: 81.5 % — AB (ref 38.4–76.8)
PLATELETS: 92 10*3/uL — AB (ref 145–400)
RBC: 3.18 10*6/uL — ABNORMAL LOW (ref 3.70–5.45)
RDW: 20 % — ABNORMAL HIGH (ref 11.2–14.5)
WBC: 10.1 10*3/uL (ref 3.9–10.3)

## 2016-03-12 LAB — COMPREHENSIVE METABOLIC PANEL
ALBUMIN: 3.8 g/dL (ref 3.5–5.0)
ALK PHOS: 131 U/L (ref 40–150)
ALT: 21 U/L (ref 0–55)
ANION GAP: 9 meq/L (ref 3–11)
AST: 27 U/L (ref 5–34)
BUN: 19.5 mg/dL (ref 7.0–26.0)
CALCIUM: 9.5 mg/dL (ref 8.4–10.4)
CHLORIDE: 107 meq/L (ref 98–109)
CO2: 25 mEq/L (ref 22–29)
Creatinine: 0.9 mg/dL (ref 0.6–1.1)
EGFR: 66 mL/min/{1.73_m2} — ABNORMAL LOW (ref >90)
Glucose: 101 mg/dl (ref 70–140)
POTASSIUM: 4.5 meq/L (ref 3.5–5.1)
Sodium: 141 mEq/L (ref 136–145)
Total Bilirubin: 0.55 mg/dL (ref 0.20–1.20)
Total Protein: 6.7 g/dL (ref 6.4–8.3)

## 2016-03-12 MED ORDER — PEGFILGRASTIM 6 MG/0.6ML ~~LOC~~ PSKT
6.0000 mg | PREFILLED_SYRINGE | Freq: Once | SUBCUTANEOUS | Status: AC
  Administered 2016-03-12: 6 mg via SUBCUTANEOUS
  Filled 2016-03-12: qty 0.6

## 2016-03-12 MED ORDER — PACLITAXEL PROTEIN-BOUND CHEMO INJECTION 100 MG
100.0000 mg/m2 | Freq: Once | INTRAVENOUS | Status: AC
  Administered 2016-03-12: 150 mg via INTRAVENOUS
  Filled 2016-03-12: qty 30

## 2016-03-12 MED ORDER — SODIUM CHLORIDE 0.9 % IV SOLN
Freq: Once | INTRAVENOUS | Status: AC
  Administered 2016-03-12: 16:00:00 via INTRAVENOUS

## 2016-03-12 MED ORDER — HEPARIN SOD (PORK) LOCK FLUSH 100 UNIT/ML IV SOLN
500.0000 [IU] | Freq: Once | INTRAVENOUS | Status: AC | PRN
  Administered 2016-03-12: 500 [IU]
  Filled 2016-03-12: qty 5

## 2016-03-12 MED ORDER — SODIUM CHLORIDE 0.9% FLUSH
10.0000 mL | INTRAVENOUS | Status: DC | PRN
  Administered 2016-03-12: 10 mL
  Filled 2016-03-12: qty 10

## 2016-03-12 MED ORDER — PROCHLORPERAZINE MALEATE 10 MG PO TABS
10.0000 mg | ORAL_TABLET | Freq: Once | ORAL | Status: AC
  Administered 2016-03-12: 10 mg via ORAL

## 2016-03-12 MED ORDER — PROCHLORPERAZINE MALEATE 10 MG PO TABS
ORAL_TABLET | ORAL | Status: AC
  Filled 2016-03-12: qty 1

## 2016-03-12 NOTE — Patient Instructions (Signed)
[facility] Discharge Instructions for Patients Receiving Chemotherapy  Today you received the following chemotherapy agents: Abraxane   To help prevent nausea and vomiting after your treatment, we encourage you to take your nausea medication as directed.    If you develop nausea and vomiting that is not controlled by your nausea medication, call the clinic.   BELOW ARE SYMPTOMS THAT SHOULD BE REPORTED IMMEDIATELY:  *FEVER GREATER THAN 100.5 F  *CHILLS WITH OR WITHOUT FEVER  NAUSEA AND VOMITING THAT IS NOT CONTROLLED WITH YOUR NAUSEA MEDICATION  *UNUSUAL SHORTNESS OF BREATH  *UNUSUAL BRUISING OR BLEEDING  TENDERNESS IN MOUTH AND THROAT WITH OR WITHOUT PRESENCE OF ULCERS  *URINARY PROBLEMS  *BOWEL PROBLEMS  UNUSUAL RASH Items with * indicate a potential emergency and should be followed up as soon as possible.  Feel free to call the clinic you have any questions or concerns. The clinic phone number is [phone].  Please show the CHEMO ALERT CARD at check-in to the Emergency Department and triage nurse.   

## 2016-03-12 NOTE — Telephone Encounter (Signed)
appt made and avs will print in treatment room °

## 2016-03-12 NOTE — Progress Notes (Signed)
Okay to treat with labs 03/12/16 (Platelets 92) per Zigmund Daniel RN per [facility] RN.

## 2016-03-12 NOTE — Progress Notes (Signed)
ID: Cassandra Nichols   DOB: 03/23/1941  MR#: [id number]  MVH#:[id number]   PCP:  Cassandra Smolder, MD GYN:  Cassandra Laroche, MD OTHER:  Cassandra Peach, MD  CHIEF COMPLAINT:  Recurrent Ovarian Cancer  CURRENT TREATMENT:  Abraxane  NOTE: THIS PATIENT REQUESTS THAT ABSOLUTELY NO NUMERIC INFORMATION BE GIVEN REGARDING ANY LAB AND PARTICULARLY DOES NOT WANT TO KNOW SPECIFIC CA-125 RESULTS  HISTORY OF PRESENT ILLNESS: From the earlier summary note:   Cassandra Nichols was initially diagnosed with ovarian cancer in July of 2002. She underwent debulking surgery at that time for what proved to be a 4 of 18 lymph nodes positive disease. She was initially treated with 6 cycles of paclitaxel and carboplatin adjuvantly, completed in December of 2002.  Patient has had multiple recurrences since December of 2002, and has been treated with multiple agents, as detailed below.  INTERVAL HISTORY: Cassandra Nichols returns today for follow up of her recurrent ovarian cancer. Today she is due for cycle 3 of abraxane, given every 14 days. Neulasta is also used for granulocyte support. She tolerates this well with few complaints. She has been passing hard stools lately and saw just a light "speck" of blood on the toilet paper a few times this month. She has no abdominal pain or distention.  REVIEW OF SYSTEMS: Cassandra Nichols denies fevers, chills, nausea, or vomiting. She did not need any of her antiemetic meds. She denies mouth sores or thrush. She always has some degree of oral tenderness due to her history of periodontal disease. Her appetite is good. She has no drop in her energy level. She has some tinging to the bottom of her feet from paclitaxel years ago but this is no worse than her baseline. A detailed review of systems is otherwise stable.   PAST MEDICAL HISTORY: Past Medical History  Diagnosis Date  . Shingles   . 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 Ovarian cancer ([place]) 11/17/2011  . Cancer [facility])     PAST SURGICAL HISTORY: Past  Surgical History  Procedure Laterality Date  . Ganglion cyst excision Left     Wrist    FAMILY HISTORY Family History  Problem Relation Age of Onset  . Hypertension Mother   . GER disease Mother   . Cancer Father     Cancer (stomach)   the patient's father died at the age of 59, from cancer that may have it started in his abdomen. He had 8 siblings including 2 sisters with breast cancer. The patient's mother had by Dr. cancer, but died from unrelated causes at the age of 73. The patient's maternal grandmother may have had ovarian cancer. The patient is an only child   GYNECOLOGIC HISTORY: Menarche age 29, first live birth age 28. The patient is GX P1. She went through the change of life around age 81. She subsequently had a TAH/BSO was part of her ovarian cancer treatment.  SOCIAL HISTORY: (Updated 03/08/2014) Cassandra Nichols lives alone. She works temporary jobs, primarily Nutritional therapist.  Her (adopted) daughter Cassandra Nichols lives [place] and is Mudlogger of the epidemiology and [place] there; she is married with 2 children. Son  Cassandra Nichols lives in [place], [place]. He has 4 children of his own. He is Tree surgeon for a [employer]. His wife is a Marine scientist   ADVANCED DIRECTIVES: At the 01/16/2016 visit Cassandra Nichols tells me that her son Cassandra Nichols is her healthcare power of attorney. He can be reached at [phone]. [Her daughter Cassandra Nichols is  her financial power of attorney.]  HEALTH MAINTENANCE:  (Updated  03/08/2014) Social History  Substance Use Topics  . Smoking status: Former Research scientist (life sciences)  . Smokeless tobacco: Never Used  . Alcohol Use: No     Comment: Occasionally     Colonoscopy:   PAP: s/p hysterectomy BSO  Bone density:   Mammogram:  09/16/2015   Allergies  Allergen Reactions  . Penicillins Rash    Current Outpatient Prescriptions  Medication Sig Dispense Refill  . dexamethasone (DECADRON) 4 MG tablet STARTING THE DAY AFTER CHEMO, TAKE 2 TABLETS BY MOUTH TWICE DAILY FOR 3 DAYS 30  tablet 0  . fluticasone (FLONASE) 50 MCG/ACT nasal spray Place 1 spray into both nostrils as needed for allergies or rhinitis.    . hydrochlorothiazide (MICROZIDE) 12.5 MG capsule TAKE 1 CAPSULE EVERY DAY 30 capsule 3  . lisinopril (PRINIVIL,ZESTRIL) 20 MG tablet TAKE 1 TABLET BY MOUTH EVERY DAY 90 tablet 0  . loratadine (CLARITIN) 10 MG tablet Take 10 mg by mouth daily as needed for allergies.    Marland Kitchen ondansetron (ZOFRAN) 8 MG tablet Take 1 tablet (8 mg total) by mouth 2 (two) times daily. Take for 3 days after chemotherapy then as needed 30 tablet 6  . prochlorperazine (COMPAZINE) 10 MG tablet Take 1 tablet (10 mg total) by mouth every 6 (six) hours as needed for nausea or vomiting. 30 tablet 6  . Alum & Mag Hydroxide-Simeth (MAGIC MOUTHWASH W/LIDOCAINE) SOLN Take 5 mLs by mouth 4 (four) times daily as needed. (Patient not taking: Reported on 03/12/2016) 240 mL 2  . Melatonin 3 MG TABS Take 3 mg by mouth at bedtime as needed. Reported on 03/12/2016     No current facility-administered medications for this visit.   Facility-Administered Medications Ordered in Other Visits  Medication Dose Route Frequency Provider Last Rate Last Dose  . heparin lock flush 100 unit/mL  500 Units Intracatheter Once PRN Chauncey Cruel, MD      . pegfilgrastim (NEULASTA ONPRO KIT) injection 6 mg  6 mg Subcutaneous Once Chauncey Cruel, MD      . sodium chloride 0.9 % injection 10 mL  10 mL Intravenous PRN Chauncey Cruel, MD   10 mL at 05/24/14 1601  . sodium chloride flush (NS) 0.9 % injection 10 mL  10 mL Intracatheter PRN Chauncey Cruel, MD        OBJECTIVE: Middle-aged white woman who appears stated age 5 Vitals:   03/12/16 1451  BP: 145/68  Pulse: 96  Temp: 97.6 F (36.4 C)  Resp: 18     Body mass index is 23.46 kg/(m^2).    ECOG FS: 1  Skin: warm, dry  HEENT: sclerae anicteric, conjunctivae pink, oropharynx clear. No thrush or mucositis.  Lymph Nodes: No cervical or supraclavicular  lymphadenopathy  Lungs: clear to auscultation bilaterally, no rales, wheezes, or rhonci  Heart: regular rate and rhythm  Abdomen: round, soft, non tender, positive bowel sounds  Musculoskeletal: No focal spinal tenderness, no peripheral edema  Neuro: non focal, well oriented, positive affect  Breasts: deferred  LAB RESULTS:  Lab Results  Component Value Date   WBC 10.1 03/12/2016   NEUTROABS 8.2* 03/12/2016   HGB 10.3* 03/12/2016   HCT 32.0* 03/12/2016   MCV 100.6 03/12/2016   PLT 92* 03/12/2016        Chemistry      Component Value Date/Time   NA 141 03/12/2016 1425   NA 140 07/04/2012 0832   K 4.5 03/12/2016  1425   K 4.2 07/04/2012 0832   CL 106 05/09/2013 1304   CL 106 07/04/2012 0832   CO2 25 03/12/2016 1425   CO2 26 07/04/2012 0832   BUN 19.5 03/12/2016 1425   BUN 17 07/04/2012 0832   CREATININE 0.9 03/12/2016 1425   CREATININE 0.82 07/04/2012 0832      Component Value Date/Time   CALCIUM 9.5 03/12/2016 1425   CALCIUM 9.2 07/04/2012 0832   ALKPHOS 131 03/12/2016 1425   ALKPHOS 71 07/04/2012 0832   AST 27 03/12/2016 1425   AST 18 07/04/2012 0832   ALT 21 03/12/2016 1425   ALT 12 07/04/2012 0832   BILITOT 0.55 03/12/2016 1425   BILITOT 0.7 07/04/2012 0832     Results for Cassandra Nichols, Cassandra Nichols (MRN [id number]) as of 01/30/2016 12:03  Ref. Range 08/29/2015 14:28 10/10/2015 11:33 11/21/2015 14:36 12/26/2015 13:46 01/16/2016 10:49  CA 125 Latest Ref Range: <35 U/mL 3215 (H) 3253 (H) 4261 (H) 4121 (H) 5321 (H)    STUDIES: No results found.   ASSESSMENT: 75 y.o.  Cassandra Nichols woman (1)  with a history of ovarian cancer dating back to July 2002 when she underwent debulking surgery for what proved to be 4/18 lymph node positive disease.   (2)  She received 6 cycles of paclitaxel/carboplatin adjuvantly completed in December 2002.   (3)  She had her first recurrence in December 2005 treated with paclitaxel;   (4)  second recurrence May 2007 treated with [place] and paclitaxel;    (5)  third recurrence January 2010 treated with topotecan and Gemzar;   (6)  fourth recurrence January 2012 treated with single-agent carboplatin (January to August 2012) with initial response, then plateau; with progression January 2013 (less than 6 months after last carbo dose) treated with single-agent gemzar given Q2 weeks (January to April 2013), with evidence of progression  (7) Doxil strartedJuly 2013 6, with evidence of initial response, and fair tolerance. Held between May and August 2014 due to patient's work schedule and scheduling issues.  Resumed treatment as of 08/24/2013, with some delays secondary to the patient's schedule. As of early December 2014, her total cumulative dose of Doxil reached 560 mg per meter square. In addition there was also an upward trend of the CA 125. Doxil was discontinued (last dose 10/30/2013). Echocardiogram on 11/14/2013 showed a well preserved ejection fraction of 55-60%.  (8)  treated with single agent paclitaxel, given on days one and 8 of each 21 day cycle, first dose on 12/27/2013,with continuing response, but mild to moderate toxicity; 11th and last dose 04/12/2014   (9) carboplatin and cyclophosphamide started on 08/28/14 given every 21 days-- stopped 10/10/2014 after a reaction to carboplatin (also no evidence of response)  (10) cyclophosphamide and bevacizumab started 12/02/2014, repeated every 21 days  (a) CT scan of the abdomen and pelvis 06/19/2015 shows stable disease  (b) CT of the chest/abd/pelvis 09/12/2015 stable  (c) PET scan 01/15/2016 shows progression in the liver and upper abdominal nodes  (11) abraxane started 01/16/2016, to be repeated every 14 days  (12) offered genetics testing at the 01/16/2016 visit  PLAN:  Cassandra Nichols looks and feels well today. Her CA 125 has been trending downwards, so hopefully we have been making progress. The rest of the labs were reviewed and were sufficient for treatment. Her platelets are 92, but she  has no abnormal bruising or bleeding.   I advised her to begin a stool softener at least daily to help pass stools easier.  Cassandra Nichols will continue treatments every other week. Her next visit will be in late May with Dr. Jana Hakim. Prior to this visit she will have a repeat PET scan. She understands and agrees with this plan. She has been encouraged to call with any issues that might arise before her next visit here.   Laurie Panda, NP    03/12/2016

## 2016-03-13 LAB — CA 125

## 2016-03-24 ENCOUNTER — Telehealth: Payer: Self-pay

## 2016-03-24 NOTE — Telephone Encounter (Signed)
Patient called stating that she had some rectal bleeding and also some bleeding from one of her nostrils for several days.  She states that she's feeling well outside of "this little problem".

## 2016-03-25 ENCOUNTER — Other Ambulatory Visit: Payer: Self-pay | Admitting: Oncology

## 2016-03-25 NOTE — Progress Notes (Unsigned)
Dear Cassandra Nichols is having some epistaxis and rectal bleeding. I'm going to see her tomorrow before treatment to make sure we can continue with the bevacizumab

## 2016-03-26 ENCOUNTER — Ambulatory Visit (HOSPITAL_BASED_OUTPATIENT_CLINIC_OR_DEPARTMENT_OTHER): Payer: Medicare Other

## 2016-03-26 ENCOUNTER — Ambulatory Visit (HOSPITAL_BASED_OUTPATIENT_CLINIC_OR_DEPARTMENT_OTHER): Payer: Medicare Other | Admitting: Oncology

## 2016-03-26 ENCOUNTER — Other Ambulatory Visit (HOSPITAL_BASED_OUTPATIENT_CLINIC_OR_DEPARTMENT_OTHER): Payer: Medicare Other

## 2016-03-26 VITALS — BP 158/77 | HR 105 | Temp 98.0°F | Resp 18 | Ht 59.0 in | Wt 115.2 lb

## 2016-03-26 DIAGNOSIS — C772 Secondary and unspecified malignant neoplasm of intra-abdominal lymph nodes: Secondary | ICD-10-CM

## 2016-03-26 DIAGNOSIS — C569 Malignant neoplasm of unspecified ovary: Secondary | ICD-10-CM | POA: Diagnosis not present

## 2016-03-26 DIAGNOSIS — G629 Polyneuropathy, unspecified: Secondary | ICD-10-CM | POA: Diagnosis not present

## 2016-03-26 DIAGNOSIS — C787 Secondary malignant neoplasm of liver and intrahepatic bile duct: Secondary | ICD-10-CM

## 2016-03-26 DIAGNOSIS — Z5111 Encounter for antineoplastic chemotherapy: Secondary | ICD-10-CM

## 2016-03-26 DIAGNOSIS — N393 Stress incontinence (female) (male): Secondary | ICD-10-CM

## 2016-03-26 LAB — COMPREHENSIVE METABOLIC PANEL
AST: 18 U/L (ref 5–34)
Albumin: 3.8 g/dL (ref 3.5–5.0)
Alkaline Phosphatase: 109 U/L (ref 40–150)
Anion Gap: 11 mEq/L (ref 3–11)
BUN: 18.1 mg/dL (ref 7.0–26.0)
CALCIUM: 9.5 mg/dL (ref 8.4–10.4)
CHLORIDE: 107 meq/L (ref 98–109)
CO2: 24 mEq/L (ref 22–29)
CREATININE: 0.9 mg/dL (ref 0.6–1.1)
EGFR: 66 mL/min/{1.73_m2} — ABNORMAL LOW (ref >90)
GLUCOSE: 81 mg/dL (ref 70–140)
Potassium: 4.3 mEq/L (ref 3.5–5.1)
SODIUM: 142 meq/L (ref 136–145)
Total Bilirubin: 0.46 mg/dL (ref 0.20–1.20)
Total Protein: 6.9 g/dL (ref 6.4–8.3)

## 2016-03-26 LAB — CBC WITH DIFFERENTIAL/PLATELET
BASO%: 0.5 % (ref 0.0–2.0)
Basophils Absolute: 0.1 10*3/uL (ref 0.0–0.1)
EOS%: 0.3 % (ref 0.0–7.0)
Eosinophils Absolute: 0 10*3/uL (ref 0.0–0.5)
HEMATOCRIT: 33 % — AB (ref 34.8–46.6)
HEMOGLOBIN: 10.5 g/dL — AB (ref 11.6–15.9)
LYMPH#: 1.4 10*3/uL (ref 0.9–3.3)
LYMPH%: 12.9 % — ABNORMAL LOW (ref 14.0–49.7)
MCH: 32.3 pg (ref 25.1–34.0)
MCHC: 31.7 g/dL (ref 31.5–36.0)
MCV: 101.7 fL — ABNORMAL HIGH (ref 79.5–101.0)
MONO#: 0.6 10*3/uL (ref 0.1–0.9)
MONO%: 5.9 % (ref 0.0–14.0)
NEUT#: 8.9 10*3/uL — ABNORMAL HIGH (ref 1.5–6.5)
NEUT%: 80.4 % — ABNORMAL HIGH (ref 38.4–76.8)
Platelets: 94 10*3/uL — ABNORMAL LOW (ref 145–400)
RBC: 3.25 10*6/uL — ABNORMAL LOW (ref 3.70–5.45)
RDW: 19.8 % — AB (ref 11.2–14.5)
WBC: 11 10*3/uL — ABNORMAL HIGH (ref 3.9–10.3)

## 2016-03-26 MED ORDER — PEGFILGRASTIM 6 MG/0.6ML ~~LOC~~ PSKT
6.0000 mg | PREFILLED_SYRINGE | Freq: Once | SUBCUTANEOUS | Status: AC
  Administered 2016-03-26: 6 mg via SUBCUTANEOUS
  Filled 2016-03-26: qty 0.6

## 2016-03-26 MED ORDER — PROCHLORPERAZINE MALEATE 10 MG PO TABS
10.0000 mg | ORAL_TABLET | Freq: Once | ORAL | Status: AC
  Administered 2016-03-26: 10 mg via ORAL

## 2016-03-26 MED ORDER — PROCHLORPERAZINE MALEATE 10 MG PO TABS
ORAL_TABLET | ORAL | Status: AC
  Filled 2016-03-26: qty 1

## 2016-03-26 MED ORDER — HEPARIN SOD (PORK) LOCK FLUSH 100 UNIT/ML IV SOLN
500.0000 [IU] | Freq: Once | INTRAVENOUS | Status: AC | PRN
  Administered 2016-03-26: 500 [IU]
  Filled 2016-03-26: qty 5

## 2016-03-26 MED ORDER — PACLITAXEL PROTEIN-BOUND CHEMO INJECTION 100 MG
100.0000 mg/m2 | Freq: Once | INTRAVENOUS | Status: AC
  Administered 2016-03-26: 150 mg via INTRAVENOUS
  Filled 2016-03-26: qty 30

## 2016-03-26 MED ORDER — SODIUM CHLORIDE 0.9 % IV SOLN
Freq: Once | INTRAVENOUS | Status: AC
  Administered 2016-03-26: 16:00:00 via INTRAVENOUS

## 2016-03-26 MED ORDER — SODIUM CHLORIDE 0.9% FLUSH
10.0000 mL | INTRAVENOUS | Status: DC | PRN
  Administered 2016-03-26: 10 mL
  Filled 2016-03-26: qty 10

## 2016-03-26 NOTE — Patient Instructions (Signed)
[facility] Discharge Instructions for Patients Receiving Chemotherapy  Today you received the following chemotherapy agents Abraxane To help prevent nausea and vomiting after your treatment, we encourage you to take your nausea medication as prescribed.   If you develop nausea and vomiting that is not controlled by your nausea medication, call the clinic.   BELOW ARE SYMPTOMS THAT SHOULD BE REPORTED IMMEDIATELY:  *FEVER GREATER THAN 100.5 F  *CHILLS WITH OR WITHOUT FEVER  NAUSEA AND VOMITING THAT IS NOT CONTROLLED WITH YOUR NAUSEA MEDICATION  *UNUSUAL SHORTNESS OF BREATH  *UNUSUAL BRUISING OR BLEEDING  TENDERNESS IN MOUTH AND THROAT WITH OR WITHOUT PRESENCE OF ULCERS  *URINARY PROBLEMS  *BOWEL PROBLEMS  UNUSUAL RASH Items with * indicate a potential emergency and should be followed up as soon as possible.  Feel free to call the clinic you have any questions or concerns. The clinic phone number is [phone].  Please show the CHEMO ALERT CARD at check-in to the Emergency Department and triage nurse.   

## 2016-03-26 NOTE — Progress Notes (Signed)
ID: Cassandra Nichols   DOB: 01-22-1941  MR#: [id number]  [id number]   PCP:  Marjorie Smolder, MD GYN:  [name], MD OTHER:  [name], MD  CHIEF COMPLAINT:  Recurrent Ovarian Cancer  CURRENT TREATMENT:  Abraxane  NOTE: THIS PATIENT REQUESTS THAT ABSOLUTELY NO NUMERIC INFORMATION BE GIVEN REGARDING ANY LAB AND PARTICULARLY DOES NOT WANT TO KNOW SPECIFIC CA-125 RESULTS  HISTORY OF PRESENT ILLNESS: From the earlier summary note:   Cassandra Nichols was initially diagnosed with ovarian cancer in July of 2002. She underwent debulking surgery at that time for what proved to be a 4 of 18 lymph nodes positive disease. She was initially treated with 6 cycles of paclitaxel and carboplatin adjuvantly, completed in December of 2002.  Patient has had multiple recurrences since December of 2002, and has been treated with multiple agents, as detailed below.  INTERVAL HISTORY: Cassandra Nichols returns today for follow up of her ovarian cancer. Today she is due for  Day 8, cycle 3cycle 3 of abraxane,  which she receives days 1 and 15 of each 28 day cycle. So far she is tolerating the treatments remarkably well. She has had no nausea or vomiting. She has neuropathy involving chiefly the feet, but she tells me this is stable. He walks everywhere and has had no problems with balance. She knows where her feet are. She does not have pain in her feet at night.  REVIEW OF SYSTEMS:  She has mild epistaxis for a couple of days and also some hemorrhoidal bleeding. Aside from this a detailed review of systems today was significant only for stress urinary incontinence which is not new. She is working full-time and enjoys her job. She likes the people she works with. Unfortunately her cousin grade almond is now under hospice care and this is causing her some grief. On the plus side her daughter in [place] has [employer] ticket so Abryana Korzeniewski can go see her grandson play in the school band. A detailed review of systems today was otherwise  noncontributory  PAST MEDICAL HISTORY: Past Medical History  Diagnosis Date  . Shingles   . 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 Ovarian cancer ([place]) 11/17/2011  . Cancer [facility])     PAST SURGICAL HISTORY: Past Surgical History  Procedure Laterality Date  . Ganglion cyst excision Left     Wrist    FAMILY HISTORY Family History  Problem Relation Age of Onset  . Hypertension Mother   . GER disease Mother   . Cancer Father     Cancer (stomach)   the patient's father died at the age of 63, from cancer that may have it started in his abdomen. He had 8 siblings including 2 sisters with breast cancer. The patient's mother had by Dr. cancer, but died from unrelated causes at the age of 23. The patient's maternal grandmother may have had ovarian cancer. The patient is an only child   GYNECOLOGIC HISTORY: Menarche age 65, first live birth age 9. The patient is GX P1. She went through the change of life around age 58. She subsequently had a TAH/BSO was part of her ovarian cancer treatment.  SOCIAL HISTORY: (Updated 03/08/2014) Cassandra Nichols lives alone. She works temporary jobs, primarily Nutritional therapist.  Her (adopted) daughter Cassandra Nichols lives [place] and is Mudlogger of the epidemiology and [place] there; she is married with 2 children. Son  Cassandra Nichols lives in [place], [place]. He has 4 children of his own. He is Tree surgeon  for a cell phone company. His wife is a Marine scientist   ADVANCED DIRECTIVES: At the 01/16/2016 visit [name] tells me that her son Cassandra Nichols is her healthcare power of attorney. He can be reached at [phone]. [Her daughter Cassandra Nichols is her financial power of attorney.]  HEALTH MAINTENANCE:  (Updated  03/08/2014) Social History  Substance Use Topics  . Smoking status: Former Research scientist (life sciences)  . Smokeless tobacco: Never Used  . Alcohol Use: No     Comment: Occasionally     Colonoscopy:   PAP: s/p hysterectomy BSO  Bone density:   Mammogram:  09/16/2015   Allergies  Allergen  Reactions  . Penicillins Rash    Current Outpatient Prescriptions  Medication Sig Dispense Refill  . Alum & Mag Hydroxide-Simeth (MAGIC MOUTHWASH W/LIDOCAINE) SOLN Take 5 mLs by mouth 4 (four) times daily as needed. (Patient not taking: Reported on 03/12/2016) 240 mL 2  . dexamethasone (DECADRON) 4 MG tablet STARTING THE DAY AFTER CHEMO, TAKE 2 TABLETS BY MOUTH TWICE DAILY FOR 3 DAYS 30 tablet 0  . fluticasone (FLONASE) 50 MCG/ACT nasal spray Place 1 spray into both nostrils as needed for allergies or rhinitis.    . hydrochlorothiazide (MICROZIDE) 12.5 MG capsule TAKE 1 CAPSULE EVERY DAY 30 capsule 3  . lisinopril (PRINIVIL,ZESTRIL) 20 MG tablet TAKE 1 TABLET BY MOUTH EVERY DAY 90 tablet 0  . loratadine (CLARITIN) 10 MG tablet Take 10 mg by mouth daily as needed for allergies.    . Melatonin 3 MG TABS Take 3 mg by mouth at bedtime as needed. Reported on 03/12/2016    . ondansetron (ZOFRAN) 8 MG tablet Take 1 tablet (8 mg total) by mouth 2 (two) times daily. Take for 3 days after chemotherapy then as needed 30 tablet 6  . prochlorperazine (COMPAZINE) 10 MG tablet Take 1 tablet (10 mg total) by mouth every 6 (six) hours as needed for nausea or vomiting. 30 tablet 6   No current facility-administered medications for this visit.   Facility-Administered Medications Ordered in Other Visits  Medication Dose Route Frequency Provider Last Rate Last Dose  . sodium chloride 0.9 % injection 10 mL  10 mL Intravenous PRN Chauncey Cruel, MD   10 mL at 05/24/14 1601    OBJECTIVE: Middle-aged white woman  In no acute distress Filed Vitals:   03/26/16 1456  BP: 158/77  Pulse: 105  Temp: 98 F (36.7 C)  Resp: 18     Body mass index is 23.25 kg/(m^2).    ECOG FS: 1  Sclerae unicteric, EOMs intact Oropharynx clear, dentition in good repair,  No significant erythema or bleeding either nostril No cervical or supraclavicular adenopathy Lungs no rales or rhonchi Heart regular rate and rhythm Abd soft,  nontender, nondistended, positive bowel sounds MSK no focal spinal tenderness, no upper extremity lymphedema Neuro: nonfocal, well oriented, appropriate affect Breasts:  deferred   LAB RESULTS:  Lab Results  Component Value Date   WBC 11.0* 03/26/2016   NEUTROABS 8.9* 03/26/2016   HGB 10.5* 03/26/2016   HCT 33.0* 03/26/2016   MCV 101.7* 03/26/2016   PLT 94* 03/26/2016        Chemistry      Component Value Date/Time   NA 141 03/12/2016 1425   NA 140 07/04/2012 0832   K 4.5 03/12/2016 1425   K 4.2 07/04/2012 0832   CL 106 05/09/2013 1304   CL 106 07/04/2012 0832   CO2 25 03/12/2016 1425   CO2 26 07/04/2012 0832   BUN  19.5 03/12/2016 1425   BUN 17 07/04/2012 0832   CREATININE 0.9 03/12/2016 1425   CREATININE 0.82 07/04/2012 0832      Component Value Date/Time   CALCIUM 9.5 03/12/2016 1425   CALCIUM 9.2 07/04/2012 0832   ALKPHOS 131 03/12/2016 1425   ALKPHOS 71 07/04/2012 0832   AST 27 03/12/2016 1425   AST 18 07/04/2012 0832   ALT 21 03/12/2016 1425   ALT 12 07/04/2012 0832   BILITOT 0.55 03/12/2016 1425   BILITOT 0.7 07/04/2012 0832     125  Status: Finalresult Visible to patient:  Not Released Nextappt: 04/09/2016 at 02:30 PM in Oncology ([facility] ONLY) Dx:  Ovarian cancer, unspecified lateralit.Marland KitchenMarland Kitchen              Ref Range 2wk ago  22mo ago  67mo ago     Cancer Antigen (CA) 125 0.0 - 38.1 U/mL 1,201.0 (H) 1,637.0 (H)CM 3,224.0 (H)CM   Comments: Roche ECLIA methodology   Resulting Agency             STUDIES: No results found.   ASSESSMENT: 75 y.o.  [place] woman (1)  with a history of ovarian cancer dating back to July 2002 when she underwent debulking surgery for what proved to be 4/18 lymph node positive disease.   (2)  She received 6 cycles of paclitaxel/carboplatin adjuvantly completed in December 2002.   (3)  She had her first recurrence in December 2005 treated with paclitaxel;   (4)  second recurrence May 2007  treated with [place] and paclitaxel;   (5)  third recurrence January 2010 treated with topotecan and Gemzar;   (6)  fourth recurrence January 2012 treated with single-agent carboplatin (January to August 2012) with initial response, then plateau; with progression January 2013 (less than 6 months after last carbo dose) treated with single-agent gemzar given Q2 weeks (January to April 2013), with evidence of progression  (7) Doxil strartedJuly 2013 6, with evidence of initial response, and fair tolerance. Held between May and August 2014 due to patient's work schedule and scheduling issues.  Resumed treatment as of 08/24/2013, with some delays secondary to the patient's schedule. As of early December 2014, her total cumulative dose of Doxil reached 560 mg per meter square. In addition there was also an upward trend of the CA 125. Doxil was discontinued (last dose 10/30/2013). Echocardiogram on 11/14/2013 showed a well preserved ejection fraction of 55-60%.  (8)  treated with single agent paclitaxel, given on days one and 8 of each 21 day cycle, first dose on 12/27/2013,with continuing response, but mild to moderate toxicity; 11th and last dose 04/12/2014   (9) carboplatin and cyclophosphamide started on 08/28/14 given every 21 days-- stopped 10/10/2014 after a reaction to carboplatin (also no evidence of response)  (10) cyclophosphamide and bevacizumab started 12/02/2014, stopped 12/26/2015 with progression  (a) CT scan of the abdomen and pelvis 06/19/2015 shows stable disease  (b) CT of the chest/abd/pelvis 09/12/2015 stable  (c) PET scan 01/15/2016 shows progression in the liver and upper abdominal nodes  (11) abraxane started 01/16/2016,  repeated every 14 days  (12) offered genetics testing at the 01/16/2016 visit  PLAN:  Yamily Vollmer  Is tolerating the Abraxane remarkably well and she is having a significant reduction in her CA 125. This is very favorable.  She does have mild neuropathy chiefly  involving the feet. However she tells me this is stable and it does not affect her activities of daily living.  She had mild epistaxis  and a little bit of blood per rectum associated with hemorrhoids. These were not either persistent or very intense problems. I suggested next time she has a nosebleed she uses a little Afrin or intervention. She understands if he uses it more than 3 days able backfire.  I don't expect she will use it very frequently. As far as his stool problem it does seem hemorrhoidal. I do suggest that she start stool softeners.  Otherwise the plan is to continue the current treatment until there is evidence of progression or intolerance. She will return to see me in approximately 3 months. Chauncey Cruel, MD    03/26/2016

## 2016-03-26 NOTE — Progress Notes (Signed)
Ok to treat with platelets 94 per Dr. Jana Hakim.

## 2016-04-09 ENCOUNTER — Ambulatory Visit (HOSPITAL_BASED_OUTPATIENT_CLINIC_OR_DEPARTMENT_OTHER): Payer: Medicare Other

## 2016-04-09 ENCOUNTER — Other Ambulatory Visit (HOSPITAL_BASED_OUTPATIENT_CLINIC_OR_DEPARTMENT_OTHER): Payer: Medicare Other

## 2016-04-09 VITALS — BP 121/60 | HR 78 | Temp 98.2°F | Resp 18

## 2016-04-09 DIAGNOSIS — C569 Malignant neoplasm of unspecified ovary: Secondary | ICD-10-CM | POA: Diagnosis not present

## 2016-04-09 DIAGNOSIS — Z5111 Encounter for antineoplastic chemotherapy: Secondary | ICD-10-CM | POA: Diagnosis not present

## 2016-04-09 LAB — CBC WITH DIFFERENTIAL/PLATELET
BASO%: 0.4 % (ref 0.0–2.0)
Basophils Absolute: 0 10*3/uL (ref 0.0–0.1)
EOS%: 0.2 % (ref 0.0–7.0)
Eosinophils Absolute: 0 10*3/uL (ref 0.0–0.5)
HCT: 30.1 % — ABNORMAL LOW (ref 34.8–46.6)
HEMOGLOBIN: 9.6 g/dL — AB (ref 11.6–15.9)
LYMPH%: 9.5 % — ABNORMAL LOW (ref 14.0–49.7)
MCH: 31.9 pg (ref 25.1–34.0)
MCHC: 31.7 g/dL (ref 31.5–36.0)
MCV: 100.7 fL (ref 79.5–101.0)
MONO#: 0.5 10*3/uL (ref 0.1–0.9)
MONO%: 4.9 % (ref 0.0–14.0)
NEUT%: 85 % — ABNORMAL HIGH (ref 38.4–76.8)
NEUTROS ABS: 9.2 10*3/uL — AB (ref 1.5–6.5)
Platelets: 94 10*3/uL — ABNORMAL LOW (ref 145–400)
RBC: 2.99 10*6/uL — ABNORMAL LOW (ref 3.70–5.45)
RDW: 19 % — AB (ref 11.2–14.5)
WBC: 10.9 10*3/uL — AB (ref 3.9–10.3)
lymph#: 1 10*3/uL (ref 0.9–3.3)

## 2016-04-09 LAB — COMPREHENSIVE METABOLIC PANEL
ALBUMIN: 3.5 g/dL (ref 3.5–5.0)
ALK PHOS: 100 U/L (ref 40–150)
ALT: 10 U/L (ref 0–55)
AST: 21 U/L (ref 5–34)
Anion Gap: 7 mEq/L (ref 3–11)
BUN: 26.4 mg/dL — AB (ref 7.0–26.0)
CO2: 23 mEq/L (ref 22–29)
Calcium: 9.3 mg/dL (ref 8.4–10.4)
Chloride: 109 mEq/L (ref 98–109)
Creatinine: 0.9 mg/dL (ref 0.6–1.1)
EGFR: 62 mL/min/{1.73_m2} — ABNORMAL LOW (ref >90)
GLUCOSE: 95 mg/dL (ref 70–140)
POTASSIUM: 4.6 meq/L (ref 3.5–5.1)
SODIUM: 140 meq/L (ref 136–145)
TOTAL PROTEIN: 6.2 g/dL — AB (ref 6.4–8.3)
Total Bilirubin: 0.56 mg/dL (ref 0.20–1.20)

## 2016-04-09 MED ORDER — PROCHLORPERAZINE MALEATE 10 MG PO TABS
ORAL_TABLET | ORAL | Status: AC
  Filled 2016-04-09: qty 1

## 2016-04-09 MED ORDER — SODIUM CHLORIDE 0.9 % IV SOLN
Freq: Once | INTRAVENOUS | Status: AC
  Administered 2016-04-09: 16:00:00 via INTRAVENOUS

## 2016-04-09 MED ORDER — PROCHLORPERAZINE MALEATE 10 MG PO TABS
10.0000 mg | ORAL_TABLET | Freq: Once | ORAL | Status: AC
  Administered 2016-04-09: 10 mg via ORAL

## 2016-04-09 MED ORDER — SODIUM CHLORIDE 0.9% FLUSH
10.0000 mL | INTRAVENOUS | Status: DC | PRN
  Administered 2016-04-09: 10 mL
  Filled 2016-04-09: qty 10

## 2016-04-09 MED ORDER — HEPARIN SOD (PORK) LOCK FLUSH 100 UNIT/ML IV SOLN
500.0000 [IU] | Freq: Once | INTRAVENOUS | Status: AC | PRN
  Administered 2016-04-09: 500 [IU]
  Filled 2016-04-09: qty 5

## 2016-04-09 MED ORDER — PEGFILGRASTIM 6 MG/0.6ML ~~LOC~~ PSKT
6.0000 mg | PREFILLED_SYRINGE | Freq: Once | SUBCUTANEOUS | Status: AC
  Administered 2016-04-09: 6 mg via SUBCUTANEOUS
  Filled 2016-04-09: qty 0.6

## 2016-04-09 MED ORDER — PACLITAXEL PROTEIN-BOUND CHEMO INJECTION 100 MG
100.0000 mg/m2 | Freq: Once | INTRAVENOUS | Status: AC
  Administered 2016-04-09: 150 mg via INTRAVENOUS
  Filled 2016-04-09: qty 30

## 2016-04-09 NOTE — Progress Notes (Signed)
Okay to treat with platelet count of 94 today per Dr. Jana Hakim.

## 2016-04-09 NOTE — Patient Instructions (Signed)
[facility] Discharge Instructions for Patients Receiving Chemotherapy  Today you received the following chemotherapy agents Abraxane.  To help prevent nausea and vomiting after your treatment, we encourage you to take your nausea medication as prescribed.   If you develop nausea and vomiting that is not controlled by your nausea medication, call the clinic.   BELOW ARE SYMPTOMS THAT SHOULD BE REPORTED IMMEDIATELY:  *FEVER GREATER THAN 100.5 F  *CHILLS WITH OR WITHOUT FEVER  NAUSEA AND VOMITING THAT IS NOT CONTROLLED WITH YOUR NAUSEA MEDICATION  *UNUSUAL SHORTNESS OF BREATH  *UNUSUAL BRUISING OR BLEEDING  TENDERNESS IN MOUTH AND THROAT WITH OR WITHOUT PRESENCE OF ULCERS  *URINARY PROBLEMS  *BOWEL PROBLEMS  UNUSUAL RASH Items with * indicate a potential emergency and should be followed up as soon as possible.  Feel free to call the clinic you have any questions or concerns. The clinic phone number is [phone].  Please show the [facility] at check-in to the Emergency Department and triage nurse.  Pegfilgrastim injection What is this medicine? PEGFILGRASTIM (PEG fil gra stim) is a long-acting granulocyte colony-stimulating factor that stimulates the growth of neutrophils, a type of white blood cell important in the body's fight against infection. It is used to reduce the incidence of fever and infection in patients with certain types of cancer who are receiving chemotherapy that affects the bone marrow, and to increase survival after being exposed to high doses of radiation. This medicine may be used for other purposes; ask your health care provider or pharmacist if you have questions. What should I tell my health care provider before I take this medicine? They need to know if you have any of these conditions: -kidney disease -latex allergy -ongoing radiation therapy -sickle cell disease -skin reactions to acrylic adhesives (On-Body Injector  only) -an unusual or allergic reaction to pegfilgrastim, filgrastim, other medicines, foods, dyes, or preservatives -pregnant or trying to get pregnant -breast-feeding How should I use this medicine? This medicine is for injection under the skin. If you get this medicine at home, you will be taught how to prepare and give the pre-filled syringe or how to use the On-body Injector. Refer to the patient Instructions for Use for detailed instructions. Use exactly as directed. Take your medicine at regular intervals. Do not take your medicine more often than directed. It is important that you put your used needles and syringes in a special sharps container. Do not put them in a trash can. If you do not have a sharps container, call your pharmacist or healthcare provider to get one. Talk to your pediatrician regarding the use of this medicine in children. While this drug may be prescribed for selected conditions, precautions do apply. Overdosage: If you think you have taken too much of this medicine contact a poison control center or emergency room at once. NOTE: This medicine is only for you. Do not share this medicine with others. What if I miss a dose? It is important not to miss your dose. Call your doctor or health care professional if you miss your dose. If you miss a dose due to an On-body Injector failure or leakage, a new dose should be administered as soon as possible using a single prefilled syringe for manual use. What may interact with this medicine? Interactions have not been studied. Give your health care provider a list of all the medicines, herbs, non-prescription drugs, or dietary supplements you use. Also tell them if you smoke, drink alcohol, or  use illegal drugs. Some items may interact with your medicine. This list may not describe all possible interactions. Give your health care provider a list of all the medicines, herbs, non-prescription drugs, or dietary supplements you use. Also  tell them if you smoke, drink alcohol, or use illegal drugs. Some items may interact with your medicine. What should I watch for while using this medicine? You may need blood work done while you are taking this medicine. If you are going to need a MRI, CT scan, or other procedure, tell your doctor that you are using this medicine (On-Body Injector only). What side effects may I notice from receiving this medicine? Side effects that you should report to your doctor or health care professional as soon as possible: -allergic reactions like skin rash, itching or hives, swelling of the face, lips, or tongue -dizziness -fever -pain, redness, or irritation at site where injected -pinpoint red spots on the skin -red or dark-brown urine -shortness of breath or breathing problems -stomach or side pain, or pain at the shoulder -swelling -tiredness -trouble passing urine or change in the amount of urine Side effects that usually do not require medical attention (report to your doctor or health care professional if they continue or are bothersome): -bone pain -muscle pain This list may not describe all possible side effects. Call your doctor for medical advice about side effects. You may report side effects to FDA at 1-800-FDA-1088. Where should I keep my medicine? Keep out of the reach of children. Store pre-filled syringes in a refrigerator between 2 and 8 degrees C (36 and 46 degrees F). Do not freeze. Keep in carton to protect from light. Throw away this medicine if it is left out of the refrigerator for more than 48 hours. Throw away any unused medicine after the expiration date. NOTE: This sheet is a summary. It may not cover all possible information. If you have questions about this medicine, talk to your doctor, pharmacist, or health care provider.    2016, Elsevier/Gold Standard. (2014-12-26 14:30:14)

## 2016-04-10 LAB — CA 125: Cancer Antigen (CA) 125: 740.9 U/mL — ABNORMAL HIGH (ref 0.0–38.1)

## 2016-04-18 ENCOUNTER — Other Ambulatory Visit: Payer: Self-pay | Admitting: Oncology

## 2016-04-23 ENCOUNTER — Ambulatory Visit (HOSPITAL_BASED_OUTPATIENT_CLINIC_OR_DEPARTMENT_OTHER): Payer: Medicare Other

## 2016-04-23 ENCOUNTER — Other Ambulatory Visit (HOSPITAL_BASED_OUTPATIENT_CLINIC_OR_DEPARTMENT_OTHER): Payer: Medicare Other

## 2016-04-23 VITALS — BP 140/74 | HR 80 | Temp 98.7°F | Resp 18

## 2016-04-23 DIAGNOSIS — Z5111 Encounter for antineoplastic chemotherapy: Secondary | ICD-10-CM

## 2016-04-23 DIAGNOSIS — C569 Malignant neoplasm of unspecified ovary: Secondary | ICD-10-CM

## 2016-04-23 LAB — COMPREHENSIVE METABOLIC PANEL
ALT: 13 U/L (ref 0–55)
ANION GAP: 9 meq/L (ref 3–11)
AST: 18 U/L (ref 5–34)
Albumin: 3.8 g/dL (ref 3.5–5.0)
Alkaline Phosphatase: 111 U/L (ref 40–150)
BILIRUBIN TOTAL: 0.54 mg/dL (ref 0.20–1.20)
BUN: 14.8 mg/dL (ref 7.0–26.0)
CALCIUM: 9.3 mg/dL (ref 8.4–10.4)
CO2: 24 meq/L (ref 22–29)
CREATININE: 0.9 mg/dL (ref 0.6–1.1)
Chloride: 108 mEq/L (ref 98–109)
EGFR: 67 mL/min/{1.73_m2} — ABNORMAL LOW (ref >90)
Glucose: 93 mg/dl (ref 70–140)
Potassium: 4.3 mEq/L (ref 3.5–5.1)
Sodium: 140 mEq/L (ref 136–145)
TOTAL PROTEIN: 6.4 g/dL (ref 6.4–8.3)

## 2016-04-23 LAB — CBC WITH DIFFERENTIAL/PLATELET
BASO%: 0.3 % (ref 0.0–2.0)
Basophils Absolute: 0 10*3/uL (ref 0.0–0.1)
EOS%: 0.1 % (ref 0.0–7.0)
Eosinophils Absolute: 0 10*3/uL (ref 0.0–0.5)
HEMATOCRIT: 30.6 % — AB (ref 34.8–46.6)
HGB: 9.8 g/dL — ABNORMAL LOW (ref 11.6–15.9)
LYMPH#: 1.2 10*3/uL (ref 0.9–3.3)
LYMPH%: 12.2 % — ABNORMAL LOW (ref 14.0–49.7)
MCH: 32.6 pg (ref 25.1–34.0)
MCHC: 31.9 g/dL (ref 31.5–36.0)
MCV: 102.1 fL — ABNORMAL HIGH (ref 79.5–101.0)
MONO#: 0.6 10*3/uL (ref 0.1–0.9)
MONO%: 6.1 % (ref 0.0–14.0)
NEUT%: 81.3 % — ABNORMAL HIGH (ref 38.4–76.8)
NEUTROS ABS: 7.8 10*3/uL — AB (ref 1.5–6.5)
PLATELETS: 96 10*3/uL — AB (ref 145–400)
RBC: 3 10*6/uL — ABNORMAL LOW (ref 3.70–5.45)
RDW: 18.3 % — AB (ref 11.2–14.5)
WBC: 9.6 10*3/uL (ref 3.9–10.3)

## 2016-04-23 MED ORDER — SODIUM CHLORIDE 0.9% FLUSH
10.0000 mL | INTRAVENOUS | Status: DC | PRN
  Filled 2016-04-23: qty 10

## 2016-04-23 MED ORDER — PEGFILGRASTIM 6 MG/0.6ML ~~LOC~~ PSKT
6.0000 mg | PREFILLED_SYRINGE | Freq: Once | SUBCUTANEOUS | Status: AC
  Administered 2016-04-23: 6 mg via SUBCUTANEOUS
  Filled 2016-04-23: qty 0.6

## 2016-04-23 MED ORDER — PACLITAXEL PROTEIN-BOUND CHEMO INJECTION 100 MG
100.0000 mg/m2 | Freq: Once | INTRAVENOUS | Status: AC
  Administered 2016-04-23: 150 mg via INTRAVENOUS
  Filled 2016-04-23: qty 30

## 2016-04-23 MED ORDER — HEPARIN SOD (PORK) LOCK FLUSH 100 UNIT/ML IV SOLN
500.0000 [IU] | Freq: Once | INTRAVENOUS | Status: DC | PRN
  Filled 2016-04-23: qty 5

## 2016-04-23 MED ORDER — SODIUM CHLORIDE 0.9 % IV SOLN
Freq: Once | INTRAVENOUS | Status: AC
  Administered 2016-04-23: 16:00:00 via INTRAVENOUS

## 2016-04-23 MED ORDER — PROCHLORPERAZINE MALEATE 10 MG PO TABS
ORAL_TABLET | ORAL | Status: AC
  Filled 2016-04-23: qty 1

## 2016-04-23 MED ORDER — PROCHLORPERAZINE MALEATE 10 MG PO TABS
10.0000 mg | ORAL_TABLET | Freq: Once | ORAL | Status: AC
  Administered 2016-04-23: 10 mg via ORAL

## 2016-04-23 NOTE — Patient Instructions (Signed)

## 2016-05-14 ENCOUNTER — Ambulatory Visit (HOSPITAL_BASED_OUTPATIENT_CLINIC_OR_DEPARTMENT_OTHER): Payer: Medicare Other | Admitting: Oncology

## 2016-05-14 ENCOUNTER — Ambulatory Visit (HOSPITAL_BASED_OUTPATIENT_CLINIC_OR_DEPARTMENT_OTHER): Payer: Medicare Other

## 2016-05-14 ENCOUNTER — Telehealth: Payer: Self-pay | Admitting: Oncology

## 2016-05-14 ENCOUNTER — Other Ambulatory Visit (HOSPITAL_BASED_OUTPATIENT_CLINIC_OR_DEPARTMENT_OTHER): Payer: Medicare Other

## 2016-05-14 VITALS — BP 149/63 | HR 89 | Temp 98.3°F | Resp 18 | Ht 59.0 in | Wt 115.7 lb

## 2016-05-14 DIAGNOSIS — K5900 Constipation, unspecified: Secondary | ICD-10-CM

## 2016-05-14 DIAGNOSIS — C569 Malignant neoplasm of unspecified ovary: Secondary | ICD-10-CM

## 2016-05-14 DIAGNOSIS — C772 Secondary and unspecified malignant neoplasm of intra-abdominal lymph nodes: Secondary | ICD-10-CM | POA: Diagnosis not present

## 2016-05-14 DIAGNOSIS — C787 Secondary malignant neoplasm of liver and intrahepatic bile duct: Secondary | ICD-10-CM

## 2016-05-14 DIAGNOSIS — G629 Polyneuropathy, unspecified: Secondary | ICD-10-CM

## 2016-05-14 DIAGNOSIS — Z5111 Encounter for antineoplastic chemotherapy: Secondary | ICD-10-CM | POA: Diagnosis not present

## 2016-05-14 LAB — COMPREHENSIVE METABOLIC PANEL
ALBUMIN: 3.7 g/dL (ref 3.5–5.0)
ALK PHOS: 88 U/L (ref 40–150)
ALT: 14 U/L (ref 0–55)
ANION GAP: 9 meq/L (ref 3–11)
AST: 20 U/L (ref 5–34)
BILIRUBIN TOTAL: 0.56 mg/dL (ref 0.20–1.20)
BUN: 21.7 mg/dL (ref 7.0–26.0)
CO2: 24 meq/L (ref 22–29)
Calcium: 9.2 mg/dL (ref 8.4–10.4)
Chloride: 107 mEq/L (ref 98–109)
Creatinine: 1 mg/dL (ref 0.6–1.1)
EGFR: 54 mL/min/{1.73_m2} — AB (ref >90)
GLUCOSE: 99 mg/dL (ref 70–140)
POTASSIUM: 4.2 meq/L (ref 3.5–5.1)
SODIUM: 140 meq/L (ref 136–145)
Total Protein: 6.5 g/dL (ref 6.4–8.3)

## 2016-05-14 LAB — CBC WITH DIFFERENTIAL/PLATELET
BASO%: 2.2 % — ABNORMAL HIGH (ref 0.0–2.0)
BASOS ABS: 0.1 10*3/uL (ref 0.0–0.1)
EOS ABS: 0.1 10*3/uL (ref 0.0–0.5)
EOS%: 1.2 % (ref 0.0–7.0)
HCT: 31.9 % — ABNORMAL LOW (ref 34.8–46.6)
HGB: 10.2 g/dL — ABNORMAL LOW (ref 11.6–15.9)
LYMPH%: 21.8 % (ref 14.0–49.7)
MCH: 32.4 pg (ref 25.1–34.0)
MCHC: 31.9 g/dL (ref 31.5–36.0)
MCV: 101.6 fL — AB (ref 79.5–101.0)
MONO#: 0.4 10*3/uL (ref 0.1–0.9)
MONO%: 7.2 % (ref 0.0–14.0)
NEUT#: 3.9 10*3/uL (ref 1.5–6.5)
NEUT%: 67.6 % (ref 38.4–76.8)
PLATELETS: 96 10*3/uL — AB (ref 145–400)
RBC: 3.14 10*6/uL — AB (ref 3.70–5.45)
RDW: 17.4 % — AB (ref 11.2–14.5)
WBC: 5.7 10*3/uL (ref 3.9–10.3)
lymph#: 1.2 10*3/uL (ref 0.9–3.3)

## 2016-05-14 MED ORDER — PROCHLORPERAZINE MALEATE 10 MG PO TABS
ORAL_TABLET | ORAL | Status: AC
  Filled 2016-05-14: qty 1

## 2016-05-14 MED ORDER — PEGFILGRASTIM 6 MG/0.6ML ~~LOC~~ PSKT
6.0000 mg | PREFILLED_SYRINGE | Freq: Once | SUBCUTANEOUS | Status: AC
  Administered 2016-05-14: 6 mg via SUBCUTANEOUS
  Filled 2016-05-14: qty 0.6

## 2016-05-14 MED ORDER — PROCHLORPERAZINE MALEATE 10 MG PO TABS
10.0000 mg | ORAL_TABLET | Freq: Once | ORAL | Status: AC
  Administered 2016-05-14: 10 mg via ORAL

## 2016-05-14 MED ORDER — PACLITAXEL PROTEIN-BOUND CHEMO INJECTION 100 MG
100.0000 mg/m2 | Freq: Once | INTRAVENOUS | Status: AC
  Administered 2016-05-14: 150 mg via INTRAVENOUS
  Filled 2016-05-14: qty 30

## 2016-05-14 MED ORDER — HEPARIN SOD (PORK) LOCK FLUSH 100 UNIT/ML IV SOLN
500.0000 [IU] | Freq: Once | INTRAVENOUS | Status: AC | PRN
  Administered 2016-05-14: 500 [IU]
  Filled 2016-05-14: qty 5

## 2016-05-14 MED ORDER — SODIUM CHLORIDE 0.9 % IV SOLN
Freq: Once | INTRAVENOUS | Status: AC
  Administered 2016-05-14: 15:00:00 via INTRAVENOUS

## 2016-05-14 MED ORDER — SODIUM CHLORIDE 0.9% FLUSH
10.0000 mL | INTRAVENOUS | Status: DC | PRN
  Administered 2016-05-14: 10 mL
  Filled 2016-05-14: qty 10

## 2016-05-14 NOTE — Telephone Encounter (Signed)
appt made and avs printed °

## 2016-05-14 NOTE — Progress Notes (Signed)
ID: [name]   DOB: August 12, 1941  MR#: [id number]  GYI#:[id number]   PCP:  Marjorie Smolder, MD GYN:  [name], MD OTHER:  [name], MD  CHIEF COMPLAINT:  Recurrent Ovarian Cancer  CURRENT TREATMENT:  Abraxane  NOTE: THIS PATIENT REQUESTS THAT ABSOLUTELY NO NUMERIC INFORMATION BE GIVEN REGARDING ANY LAB AND PARTICULARLY DOES NOT WANT TO KNOW SPECIFIC CA-125 RESULTS  HISTORY OF PRESENT ILLNESS: From the earlier summary note:   Cassandra Nichols was initially diagnosed with ovarian cancer in July of 2002. She underwent debulking surgery at that time for what proved to be a 4 of 18 lymph nodes positive disease. She was initially treated with 6 cycles of paclitaxel and carboplatin adjuvantly, completed in December of 2002.  Patient has had multiple recurrences since December of 2002, and has been treated with multiple agents, as detailed below.  INTERVAL HISTORY: Cassandra Nichols returns today for follow up of her recurrent ovarian cancer.  Today Is Day 1 Cycle 5 of Abraxane, Which She Receives Day 1 and Day 15 of Each 28 Day Cycle.  She Is Tolerating the Abraxane Remarkably Well. She Continues to Work [employer]. She Has Minimal Nausea, for Which She Takes Compazine on Day 2 Only.  When Asked She Says She Does Have a Little Bit of Numbness in Her Toes and a Little Bit in the Front of the Feet, but This Is Nonprogressive. 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 We Just Wanted to Get Any Worse Than It Is Already  REVIEW OF SYSTEMS:  She has  Been Mildly Constipated and Started Herself onDocusate. This Is Working. Otherwise She Just Had a Wonderful Trip to [employer] She Saw HER-2 Grandchildren Play in the [place]. One Is Lead Viola and the Other Lead by [place]. The Older One, Who Is 29,  Has Been Ad[facility].  Due Again Is Also Working Full Time Right Now.A Detailed Review of Systems Today Was  Otherwise Stable.  PAST  MEDICAL HISTORY: Past Medical History  Diagnosis Date  . Shingles   . 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 Ovarian cancer ([place]) 11/17/2011  . Cancer [place])     PAST SURGICAL HISTORY: Past Surgical History  Procedure Laterality Date  . Ganglion cyst excision Left     Wrist    FAMILY HISTORY Family History  Problem Relation Age of Onset  . Hypertension Mother   . GER disease Mother   . Cancer Father     Cancer (stomach)   the patient's father died at the age of 77, from cancer that may have it started in his abdomen. He had 8 siblings including 2 sisters with breast cancer. The patient's mother had by Dr. cancer, but died from unrelated causes at the age of 69. The patient's maternal grandmother may have had ovarian cancer. The patient is an only child   GYNECOLOGIC HISTORY: Menarche age 53, first live birth age 42. The patient is GX P1. She went through the change of life around age 40. She subsequently had a TAH/BSO was part of her ovarian cancer treatment.  SOCIAL HISTORY: (Updated 03/08/2014) Cassandra Nichols lives alone. She works temporary jobs, primarily Nutritional therapist.  Her (adopted) daughter [name] lives [place] and is Mudlogger of the epidemiology and [place] there; she is married with 2 children. Son  [name] lives in [place], [place]. He has 4 children of his own. He is Tree surgeon  for a cell phone company. His wife is a Marine scientist   ADVANCED DIRECTIVES: At the 01/16/2016 visit Cassandra Nichols tells me that her son [name] is her healthcare power of attorney. He can be reached at [phone]. [Her daughter [name] is her financial power of attorney.]  HEALTH MAINTENANCE:  (Updated  03/08/2014) Social History  Substance Use Topics  . Smoking status: Former Research scientist (life sciences)  . Smokeless tobacco: Never Used  . Alcohol Use: No     Comment: Occasionally     Colonoscopy:   PAP: s/p hysterectomy BSO  Bone density:   Mammogram:  09/16/2015   Allergies  Allergen Reactions  . Penicillins Rash     Current Outpatient Prescriptions  Medication Sig Dispense Refill  . fluticasone (FLONASE) 50 MCG/ACT nasal spray Place 1 spray into both nostrils as needed for allergies or rhinitis.    . hydrochlorothiazide (MICROZIDE) 12.5 MG capsule TAKE 1 CAPSULE BY MOUTH DAILY 90 capsule 3  . lisinopril (PRINIVIL,ZESTRIL) 20 MG tablet TAKE 1 TABLET BY MOUTH EVERY DAY 90 tablet 0  . loratadine (CLARITIN) 10 MG tablet Take 10 mg by mouth daily as needed for allergies.    . Melatonin 3 MG TABS Take 3 mg by mouth at bedtime as needed. Reported on 03/12/2016    . ondansetron (ZOFRAN) 8 MG tablet Take 1 tablet (8 mg total) by mouth 2 (two) times daily. Take for 3 days after chemotherapy then as needed 30 tablet 6  . prochlorperazine (COMPAZINE) 10 MG tablet Take 1 tablet (10 mg total) by mouth every 6 (six) hours as needed for nausea or vomiting. 30 tablet 6   No current facility-administered medications for this visit.    OBJECTIVE: Middle-aged white woman   Filed Vitals:   05/14/16 1419  BP: 149/63  Pulse: 89  Temp: 98.3 F (36.8 C)  Resp: 18     Body mass index is 23.36 kg/(m^2).    ECOG FS: 1  Sclerae unicteric, pupils round and equal Oropharynx clear and moist-- no thrush or other lesions No cervical or supraclavicular adenopathy Lungs no rales or rhonchi Heart regular rate and rhythm Abd soft, nontender, positive bowel sounds, No masses palpated MSK no focal spinal tenderness, no upper extremity lymphedema Neuro: nonfocal, well oriented, appropriate affect Breasts: deferred    LAB RESULTS:  Lab Results  Component Value Date   WBC 5.7 05/14/2016   NEUTROABS 3.9 05/14/2016   HGB 10.2* 05/14/2016   HCT 31.9* 05/14/2016   MCV 101.6* 05/14/2016   PLT 96* 05/14/2016        Chemistry      Component Value Date/Time   NA 140 05/14/2016 1412   NA 140 07/04/2012 0832   K 4.2 05/14/2016 1412   K 4.2 07/04/2012 0832   CL 106 05/09/2013 1304   CL 106 07/04/2012 0832   CO2 24  05/14/2016 1412   CO2 26 07/04/2012 0832   BUN 21.7 05/14/2016 1412   BUN 17 07/04/2012 0832   CREATININE 1.0 05/14/2016 1412   CREATININE 0.82 07/04/2012 0832      Component Value Date/Time   CALCIUM 9.2 05/14/2016 1412   CALCIUM 9.2 07/04/2012 0832   ALKPHOS 88 05/14/2016 1412   ALKPHOS 71 07/04/2012 0832   AST 20 05/14/2016 1412   AST 18 07/04/2012 0832   ALT 14 05/14/2016 1412   ALT 12 07/04/2012 0832   BILITOT 0.56 05/14/2016 1412   BILITOT 0.7 07/04/2012 1601  STUDIES: No results found.   ASSESSMENT: 75 y.o.  [place] woman (1)  with a history of ovarian cancer dating back to July 2002 when she underwent debulking surgery for what proved to be 4/18 lymph node positive disease.   (2)  She received 6 cycles of paclitaxel/carboplatin adjuvantly completed in December 2002.   (3)  She had her first recurrence in December 2005 treated with paclitaxel;   (4)  second recurrence May 2007 treated with [place] and paclitaxel;   (5)  third recurrence January 2010 treated with topotecan and Gemzar;   (6)  fourth recurrence January 2012 treated with single-agent carboplatin (January to August 2012) with initial response, then plateau; with progression January 2013 (less than 6 months after last carbo dose) treated with single-agent gemzar given Q2 weeks (January to April 2013), with evidence of progression  (7) Doxil strartedJuly 2013 6, with evidence of initial response, and fair tolerance. Held between May and August 2014 due to patient's work schedule and scheduling issues.  Resumed treatment as of 08/24/2013, with some delays secondary to the patient's schedule. As of early December 2014, her total cumulative dose of Doxil reached 560 mg per meter square. In addition there was also an upward trend of the CA 125. Doxil was discontinued (last dose 10/30/2013). Echocardiogram on 11/14/2013 showed a well preserved ejection fraction of 55-60%.  (8)  treated with single  agent paclitaxel, given on days one and 8 of each 21 day cycle, first dose on 12/27/2013,with continuing response, but mild to moderate toxicity; 11th and last dose 04/12/2014   (9) carboplatin and cyclophosphamide started on 08/28/14 given every 21 days-- stopped 10/10/2014 after a reaction to carboplatin (also no evidence of response)  (10) cyclophosphamide and bevacizumab started 12/02/2014, stopped 12/26/2015 with progression  (a) CT scan of the abdomen and pelvis 06/19/2015 shows stable disease  (b) CT of the chest/abd/pelvis 09/12/2015 stable  (c) PET scan 01/15/2016 shows progression in the liver and upper abdominal nodes  (11) abraxane started 01/16/2016,  repeated every 14 days  (12) offered genetics testing at the 01/16/2016 visit, but declined  PLAN:  Cassandra Nichols  Continues to Do Remarkably Wellon Abraxane.We Again Reviewed Issues regarding Neuropathy. She Does Have Some NumbnessinHer Toes and a Little Bit at [employer] of Her Feet but She Assures Me This Has Not Changed. She Understands We Do Need to Keep an Eye on This As That Can Bea Permanent Problem.  Otherwise the Plan Is to Continue Abraxane Every 2 Weeks. I Had Written for a PET Scan but She Decided Not to Do That Because She Does Not Have Time Right Now She Says. We Will Reconsider the PET Scan Sometime before the End of the Year.  Her Hair Is Thinning Although She Has Not Lost More Than about a Third of It. She Is Looking at Ways and Today I Wrote Her a Prescription That She Can Use Eitherin Our Own [employer] or Shop Which Is Free or at [employer] She Is Actually Working with Where Of Course She Will Have More Variety  I Am Delighted That She Is Doing so Well. I Will See Her Again Late July. We Will Continue to Follow Her CA 125 Closely.She Knows to Call for Any Problems That May Develop before Her Next Visit Chauncey Cruel, MD    05/14/2016

## 2016-05-14 NOTE — Patient Instructions (Signed)

## 2016-05-15 LAB — CA 125: CANCER ANTIGEN (CA) 125: 552.2 U/mL — AB (ref 0.0–38.1)

## 2016-05-16 ENCOUNTER — Other Ambulatory Visit: Payer: Self-pay | Admitting: Oncology

## 2016-05-31 ENCOUNTER — Other Ambulatory Visit: Payer: Self-pay | Admitting: Oncology

## 2016-06-03 ENCOUNTER — Other Ambulatory Visit: Payer: Self-pay | Admitting: *Deleted

## 2016-06-03 DIAGNOSIS — C569 Malignant neoplasm of unspecified ovary: Secondary | ICD-10-CM

## 2016-06-04 ENCOUNTER — Ambulatory Visit: Payer: Medicare Other

## 2016-06-04 ENCOUNTER — Other Ambulatory Visit: Payer: Medicare Other

## 2016-06-25 ENCOUNTER — Ambulatory Visit (HOSPITAL_BASED_OUTPATIENT_CLINIC_OR_DEPARTMENT_OTHER): Payer: Medicare Other

## 2016-06-25 ENCOUNTER — Other Ambulatory Visit (HOSPITAL_BASED_OUTPATIENT_CLINIC_OR_DEPARTMENT_OTHER): Payer: Medicare Other

## 2016-06-25 DIAGNOSIS — C772 Secondary and unspecified malignant neoplasm of intra-abdominal lymph nodes: Secondary | ICD-10-CM | POA: Diagnosis not present

## 2016-06-25 DIAGNOSIS — C787 Secondary malignant neoplasm of liver and intrahepatic bile duct: Secondary | ICD-10-CM | POA: Diagnosis not present

## 2016-06-25 DIAGNOSIS — Z5111 Encounter for antineoplastic chemotherapy: Secondary | ICD-10-CM

## 2016-06-25 DIAGNOSIS — C569 Malignant neoplasm of unspecified ovary: Secondary | ICD-10-CM

## 2016-06-25 LAB — CBC WITH DIFFERENTIAL/PLATELET
BASO%: 0.5 % (ref 0.0–2.0)
Basophils Absolute: 0 10*3/uL (ref 0.0–0.1)
EOS ABS: 0.1 10*3/uL (ref 0.0–0.5)
EOS%: 1 % (ref 0.0–7.0)
HCT: 35 % (ref 34.8–46.6)
HGB: 11.3 g/dL — ABNORMAL LOW (ref 11.6–15.9)
LYMPH%: 32.7 % (ref 14.0–49.7)
MCH: 31.1 pg (ref 25.1–34.0)
MCHC: 32.2 g/dL (ref 31.5–36.0)
MCV: 96.6 fL (ref 79.5–101.0)
MONO#: 0.6 10*3/uL (ref 0.1–0.9)
MONO%: 10 % (ref 0.0–14.0)
NEUT%: 55.8 % (ref 38.4–76.8)
NEUTROS ABS: 3.1 10*3/uL (ref 1.5–6.5)
Platelets: 106 10*3/uL — ABNORMAL LOW (ref 145–400)
RBC: 3.62 10*6/uL — AB (ref 3.70–5.45)
RDW: 16 % — ABNORMAL HIGH (ref 11.2–14.5)
WBC: 5.6 10*3/uL (ref 3.9–10.3)
lymph#: 1.8 10*3/uL (ref 0.9–3.3)

## 2016-06-25 LAB — COMPREHENSIVE METABOLIC PANEL
ALT: 11 U/L (ref 0–55)
AST: 19 U/L (ref 5–34)
Albumin: 3.9 g/dL (ref 3.5–5.0)
Alkaline Phosphatase: 69 U/L (ref 40–150)
Anion Gap: 10 mEq/L (ref 3–11)
BUN: 24.2 mg/dL (ref 7.0–26.0)
CO2: 26 meq/L (ref 22–29)
Calcium: 9.6 mg/dL (ref 8.4–10.4)
Chloride: 104 mEq/L (ref 98–109)
Creatinine: 1.1 mg/dL (ref 0.6–1.1)
EGFR: 50 mL/min/{1.73_m2} — AB (ref >90)
GLUCOSE: 86 mg/dL (ref 70–140)
POTASSIUM: 4.4 meq/L (ref 3.5–5.1)
SODIUM: 139 meq/L (ref 136–145)
TOTAL PROTEIN: 6.9 g/dL (ref 6.4–8.3)
Total Bilirubin: 0.59 mg/dL (ref 0.20–1.20)

## 2016-06-25 MED ORDER — SODIUM CHLORIDE 0.9 % IV SOLN
Freq: Once | INTRAVENOUS | Status: AC
  Administered 2016-06-25: 16:00:00 via INTRAVENOUS

## 2016-06-25 MED ORDER — HEPARIN SOD (PORK) LOCK FLUSH 100 UNIT/ML IV SOLN
500.0000 [IU] | Freq: Once | INTRAVENOUS | Status: AC | PRN
  Administered 2016-06-25: 500 [IU]
  Filled 2016-06-25: qty 5

## 2016-06-25 MED ORDER — SODIUM CHLORIDE 0.9% FLUSH
10.0000 mL | INTRAVENOUS | Status: DC | PRN
  Administered 2016-06-25: 10 mL
  Filled 2016-06-25: qty 10

## 2016-06-25 MED ORDER — PROCHLORPERAZINE MALEATE 10 MG PO TABS
10.0000 mg | ORAL_TABLET | Freq: Once | ORAL | Status: AC
  Administered 2016-06-25: 10 mg via ORAL

## 2016-06-25 MED ORDER — PROCHLORPERAZINE MALEATE 10 MG PO TABS
ORAL_TABLET | ORAL | Status: AC
  Filled 2016-06-25: qty 1

## 2016-06-25 MED ORDER — PACLITAXEL PROTEIN-BOUND CHEMO INJECTION 100 MG
100.0000 mg/m2 | Freq: Once | INTRAVENOUS | Status: AC
  Administered 2016-06-25: 150 mg via INTRAVENOUS
  Filled 2016-06-25: qty 30

## 2016-06-25 MED ORDER — PEGFILGRASTIM 6 MG/0.6ML ~~LOC~~ PSKT
6.0000 mg | PREFILLED_SYRINGE | Freq: Once | SUBCUTANEOUS | Status: AC
  Administered 2016-06-25: 6 mg via SUBCUTANEOUS
  Filled 2016-06-25: qty 0.6

## 2016-06-25 NOTE — Patient Instructions (Signed)

## 2016-07-15 ENCOUNTER — Other Ambulatory Visit: Payer: Self-pay | Admitting: *Deleted

## 2016-07-15 ENCOUNTER — Telehealth: Payer: Self-pay | Admitting: Oncology

## 2016-07-15 ENCOUNTER — Ambulatory Visit (HOSPITAL_BASED_OUTPATIENT_CLINIC_OR_DEPARTMENT_OTHER): Payer: Medicare Other | Admitting: Oncology

## 2016-07-15 VITALS — BP 140/76 | HR 87 | Temp 98.4°F | Resp 18 | Wt 114.6 lb

## 2016-07-15 DIAGNOSIS — C772 Secondary and unspecified malignant neoplasm of intra-abdominal lymph nodes: Secondary | ICD-10-CM

## 2016-07-15 DIAGNOSIS — C787 Secondary malignant neoplasm of liver and intrahepatic bile duct: Secondary | ICD-10-CM | POA: Diagnosis not present

## 2016-07-15 DIAGNOSIS — C569 Malignant neoplasm of unspecified ovary: Secondary | ICD-10-CM

## 2016-07-15 DIAGNOSIS — G629 Polyneuropathy, unspecified: Secondary | ICD-10-CM | POA: Diagnosis not present

## 2016-07-15 NOTE — Telephone Encounter (Signed)
appt made and avs printed °

## 2016-07-15 NOTE — Progress Notes (Signed)
ID: Cassandra Nichols   DOB: 1941/12/02  MR#: [id number]  [id number]   PCP:  Marjorie Smolder, MD GYN:  [name], MD OTHER:  [name], MD  CHIEF COMPLAINT:  Recurrent Ovarian Cancer  CURRENT TREATMENT:  [Abraxane]  NOTE: THIS PATIENT REQUESTS THAT ABSOLUTELY NO NUMERIC INFORMATION BE GIVEN REGARDING ANY LAB AND PARTICULARLY DOES NOT WANT TO KNOW SPECIFIC CA-125 RESULTS  HISTORY OF PRESENT ILLNESS: From the earlier summary note:   Cassandra Nichols was initially diagnosed with ovarian cancer in July of 2002. She underwent debulking surgery at that time for what proved to be a 4 of 18 lymph nodes positive disease. She was initially treated with 6 cycles of paclitaxel and carboplatin adjuvantly, completed in December of 2002.  Patient has had multiple recurrences since December of 2002, and has been treated with multiple agents, as detailed below.  INTERVAL HISTORY: Cassandra Nichols returns today for follow up of her ovarian cancer. Today is day 21 cycle 6 of Abraxane, of which she has received a total of 10 doses. They have been some irregularities in how she receives this drug because of family trips, but she was supposed to be treated again tomorrow. However we are going to discontinue this treatment because of neuropathy concerns.  REVIEW OF SYSTEMS: She feels like her feet are wrapped in parchment paper. She actually has good sensation on her toes despite that. However she is having more balance problems. This is referral to not quite knowing were her feet are. Aside from that, she is eating well, has a good sense of taste, and fair energy. She just travel or to [place] for 2 weekends in a row to watch her grandchildren graduate and play in a band. She is planning to visit her son in [place]. She is currently between jobs as her employment is very seasonal. A detailed review of systems today was otherwise stable  PAST MEDICAL HISTORY: Past Medical History:  Diagnosis Date  . Cancer ([place])   .  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 Ovarian cancer ([place]) 11/17/2011  . Shingles     PAST SURGICAL HISTORY: Past Surgical History:  Procedure Laterality Date  . GANGLION CYST EXCISION Left    Wrist    FAMILY HISTORY Family History  Problem Relation Age of Onset  . Hypertension Mother   . GER disease Mother   . Cancer Father     Cancer (stomach)   the patient's father died at the age of 14, from cancer that may have it started in his abdomen. He had 8 siblings including 2 sisters with breast cancer. The patient's mother had by Dr. cancer, but died from unrelated causes at the age of 87. The patient's maternal grandmother may have had ovarian cancer. The patient is an only child   GYNECOLOGIC HISTORY: Menarche age 34, first live birth age 65. The patient is GX P1. She went through the change of life around age 31. She subsequently had a TAH/BSO was part of her ovarian cancer treatment.  SOCIAL HISTORY: (Updated 03/08/2014) Cassandra Nichols lives alone. She works temporary jobs, primarily Nutritional therapist.  Her (adopted) daughter Cassandra Nichols lives [place] and is Mudlogger of the epidemiology and [place] there; she is married with 2 children. Son  Cassandra Nichols lives in [place], [place]. He has 4 children of his own. He is Tree surgeon for a [employer]. His wife is a Marine scientist   ADVANCED DIRECTIVES: At the 01/16/2016 visit Cassandra Nichols tells me that her  son Cassandra Nichols is her healthcare power of attorney. He can be reached at [phone]. [Her daughter Cassandra Nichols is her financial power of attorney.]  HEALTH MAINTENANCE:  (Updated  03/08/2014) Social History  Substance Use Topics  . Smoking status: Former Research scientist (life sciences)  . Smokeless tobacco: Never Used  . Alcohol use No     Comment: Occasionally     Colonoscopy:   PAP: s/p hysterectomy BSO  Bone density:   Mammogram:  09/16/2015   Allergies  Allergen Reactions  . Penicillins Rash    Current Outpatient Prescriptions  Medication Sig Dispense Refill  .  docusate sodium (COLACE) 100 MG capsule Take 100 mg by mouth 2 (two) times daily.    . fluticasone (FLONASE) 50 MCG/ACT nasal spray Place 1 spray into both nostrils as needed for allergies or rhinitis.    . hydrochlorothiazide (MICROZIDE) 12.5 MG capsule TAKE 1 CAPSULE BY MOUTH DAILY 30 capsule 0  . lisinopril (PRINIVIL,ZESTRIL) 20 MG tablet TAKE 1 TABLET BY MOUTH EVERY DAY 90 tablet 0  . loratadine (CLARITIN) 10 MG tablet Take 10 mg by mouth daily as needed for allergies.    . Melatonin 3 MG TABS Take 3 mg by mouth at bedtime as needed. Reported on 03/12/2016    . prochlorperazine (COMPAZINE) 10 MG tablet Take 1 tablet (10 mg total) by mouth every 6 (six) hours as needed for nausea or vomiting. 30 tablet 6   No current facility-administered medications for this visit.     OBJECTIVE: Middle-aged white woman In no acute distress Vitals:   07/15/16 1604  BP: 140/76  Pulse: 87  Resp: 18  Temp: 98.4 F (36.9 C)     Body mass index is 23.15 kg/m.    ECOG FS: 1  Sclerae unicteric, EOMs intact Oropharynx clear and moist No cervical or supraclavicular adenopathy Lungs no rales or rhonchi Heart regular rate and rhythm Abd soft, nontender, positive bowel sounds, no masses palpated MSK no focal spinal tenderness, no upper extremity lymphedema Neuro: nonfocal, well oriented, appropriate affect; she can feel light touch in the pads of her toes Breasts: Deferred  LAB RESULTS:  Results for Cassandra Nichols, Cassandra Nichols (MRN [id number]) as of 07/15/2016 17:31  Ref. Range 01/16/2016 10:49 02/13/2016 15:02 03/12/2016 14:25 04/09/2016 14:55 05/14/2016 14:12  Cancer Antigen (CA) 125 Latest Ref Range: 0.0 - 38.1 U/mL 3,224.0 (H) 1,637.0 (H) 1,201.0 (H) 740.9 (H) 552.2 (H)   Lab Results  Component Value Date   WBC 5.6 06/25/2016   NEUTROABS 3.1 06/25/2016   HGB 11.3 (L) 06/25/2016   HCT 35.0 06/25/2016   MCV 96.6 06/25/2016   PLT 106 (L) 06/25/2016        Chemistry      Component Value Date/Time   NA 139  06/25/2016 1439   K 4.4 06/25/2016 1439   CL 106 05/09/2013 1304   CO2 26 06/25/2016 1439   BUN 24.2 06/25/2016 1439   CREATININE 1.1 06/25/2016 1439      Component Value Date/Time   CALCIUM 9.6 06/25/2016 1439   ALKPHOS 69 06/25/2016 1439   AST 19 06/25/2016 1439   ALT 11 06/25/2016 1439   BILITOT 0.59 06/25/2016 1439               STUDIES: No results found.   ASSESSMENT: 75 y.o.  [place] woman (1)  with a history of ovarian cancer dating back to July 2002 when she underwent debulking surgery for what proved to be 4/18 lymph node positive disease.   (2)  She received  6 cycles of paclitaxel/carboplatin adjuvantly completed in December 2002.   (3)  She had her first recurrence in December 2005 treated with paclitaxel;   (4)  second recurrence May 2007 treated with [place] and paclitaxel;   (5)  third recurrence January 2010 treated with topotecan and Gemzar;   (6)  fourth recurrence January 2012 treated with single-agent carboplatin (January to August 2012) with initial response, then plateau; with progression January 2013 (less than 6 months after last carbo dose) treated with single-agent gemzar given Q2 weeks (January to April 2013), with evidence of progression  (7) Doxil strartedJuly 2013 6, with evidence of initial response, and fair tolerance. Held between May and August 2014 due to patient's work schedule and scheduling issues.  Resumed treatment as of 08/24/2013, with some delays secondary to the patient's schedule. As of early December 2014, her total cumulative dose of Doxil reached 560 mg per meter square. In addition there was also an upward trend of the CA 125. Doxil was discontinued (last dose 10/30/2013). Echocardiogram on 11/14/2013 showed a well preserved ejection fraction of 55-60%.  (8)  treated with single agent paclitaxel, given on days one and 8 of each 21 day cycle, first dose on 12/27/2013,with continuing response, but mild to moderate toxicity; 11th  and last dose 04/12/2014   (9) carboplatin and cyclophosphamide started on 08/28/14 given every 21 days-- stopped 10/10/2014 after a reaction to carboplatin (also no evidence of response)  (10) cyclophosphamide and bevacizumab started 12/02/2014, stopped 12/26/2015 with progression  (a) CT scan of the abdomen and pelvis 06/19/2015 shows stable disease  (b) CT of the chest/abd/pelvis 09/12/2015 stable  (c) PET scan 01/15/2016 shows progression in the liver and upper abdominal nodes  (11) abraxane started 01/16/2016,  repeated every 14 days with evidence of response  (a) last dose 06/25/2016, discontinued because of neuropathy  (12) offered genetics testing at the 01/16/2016 visit, but declined  PLAN:  Cassandra Nichols has had a wonderful drop in her CA 125 with the Abraxane treatment. Unfortunately she has also developed some neuropathy. She tells me the neuropathy came on fairly quickly and has not progressed. Nevertheless I am concerned as she tends to minimize symptoms, so many of her patient's 2, because they are motivated to receive treatment.  At a very minimum I think she needs a treatment break. She in addition has some family trips that she wants to take so she will see me again in August 25. At that time we will consider whether to continue her Abraxane perhaps at broader intervals, or to switch to a different agent.  Alternatively we could consider giving her a longer treatment break and resuming therapy when her CEA 125 starts to climb again. We will discuss this as well as other options when she returns to see me at the end of August. Cassandra Cruel, MD    07/15/2016

## 2016-07-16 ENCOUNTER — Ambulatory Visit: Payer: Medicare Other

## 2016-07-16 ENCOUNTER — Other Ambulatory Visit: Payer: Medicare Other

## 2016-07-30 ENCOUNTER — Ambulatory Visit: Payer: Medicare Other

## 2016-07-30 ENCOUNTER — Other Ambulatory Visit: Payer: Medicare Other

## 2016-07-30 ENCOUNTER — Other Ambulatory Visit: Payer: Self-pay | Admitting: *Deleted

## 2016-08-02 ENCOUNTER — Other Ambulatory Visit: Payer: Self-pay | Admitting: *Deleted

## 2016-08-02 DIAGNOSIS — M79673 Pain in unspecified foot: Secondary | ICD-10-CM

## 2016-08-03 ENCOUNTER — Encounter: Payer: Self-pay | Admitting: Podiatry

## 2016-08-03 ENCOUNTER — Ambulatory Visit (INDEPENDENT_AMBULATORY_CARE_PROVIDER_SITE_OTHER): Payer: Medicare Other | Admitting: Podiatry

## 2016-08-03 DIAGNOSIS — Q828 Other specified congenital malformations of skin: Secondary | ICD-10-CM | POA: Diagnosis not present

## 2016-08-03 NOTE — Progress Notes (Signed)
   Subjective:    Patient ID: Cassandra Nichols, female    DOB: [birth year], 75 y.o.   MRN: [id number]  HPI: She has a small callused area on the lateral anterolateral aspect of the left foot. She states has been there for the past several months and seems to get be getting more tender if she steps on it. She's tried over-the-counter callus removers to help soften it but to know about. She tries to trim it herself.    Review of Systems  All other systems reviewed and are negative.      Objective:   Physical Exam: Vital signs are stable she is alert and oriented 3. Pulses are palpable. Neurologic sensorium is intact H and reflection intact muscle strength is normal. Orthopedic evaluation demonstrates all joints distal ankle full range of motion without crepitus mild hammertoe deformity second bilateral resulting in's reactive hyperkeratosis plantar aspect of the forefoot bilaterally. She has solitary porokeratotic lesion non-complicated to the plantar aspect of the left foot beneath the fourth metatarsal head. This measures approximately 2 mm in diameter. I was able to do a nucleated lesion today no bleeding        Assessment & Plan:  Assessment: Painful porokeratosis left.  Plan: The nucleated lesion today manually and then placed Cantharone under occlusion which we washed off to more morning for further debridement. Follow up with her in 6 weeks or as needed.

## 2016-08-08 IMAGING — CT CT ABD-PELV W/ CM
1 of 3 series · 13 of 32 positions shown, 18 images · IV contrast (OMNIPAQUE)
Comparison: 06/19/2015

CLINICAL DATA: Restaging ovarian cancer.

EXAM:
CT ABDOMEN AND PELVIS WITH CONTRAST
TECHNIQUE: Multidetector CT imaging of the abdomen and pelvis was performed
using the standard protocol following bolus administration of
intravenous contrast.
CONTRAST:  100mL OMNIPAQUE IOHEXOL 300 MG/ML  SOLN

[Series 2: rtn ap with st · axial · 0.74mm/px · z∈[+807,+1157]mm · 13 of 80 slices shown, 18 images]
[im 5/80  soft-tissue]
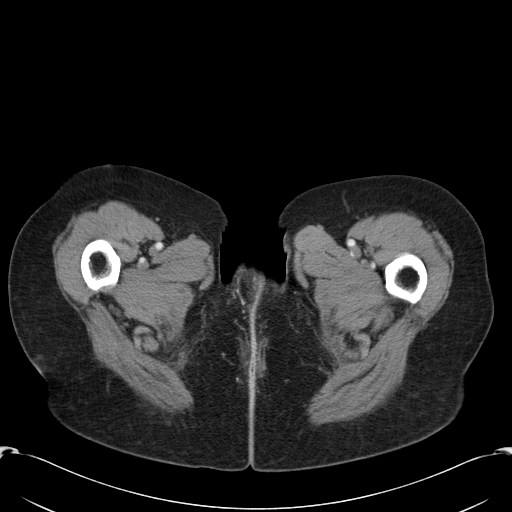
[im 5/80  bone]
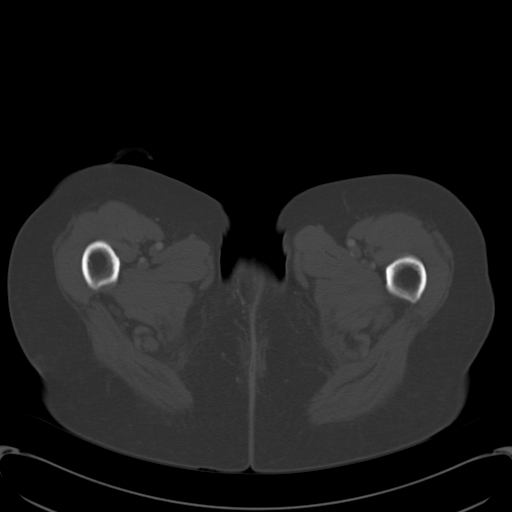
[im 13/80  soft-tissue]
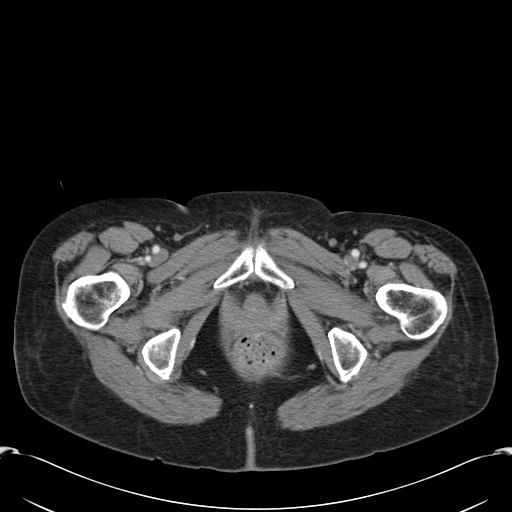
[im 17/80  soft-tissue]
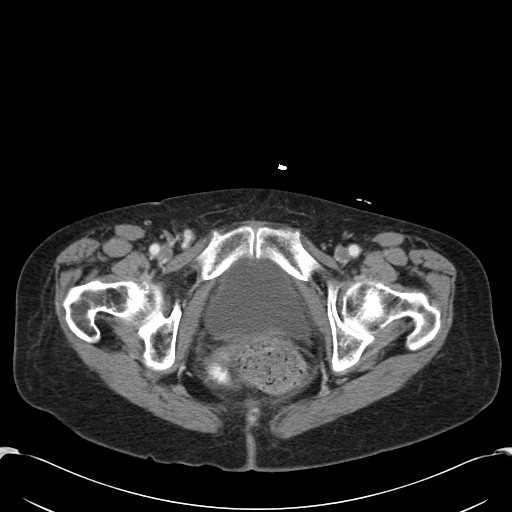
[im 25/80  soft-tissue]
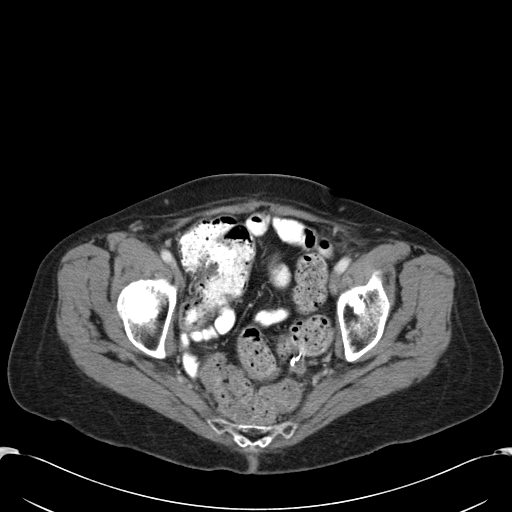
[im 30/80  soft-tissue]
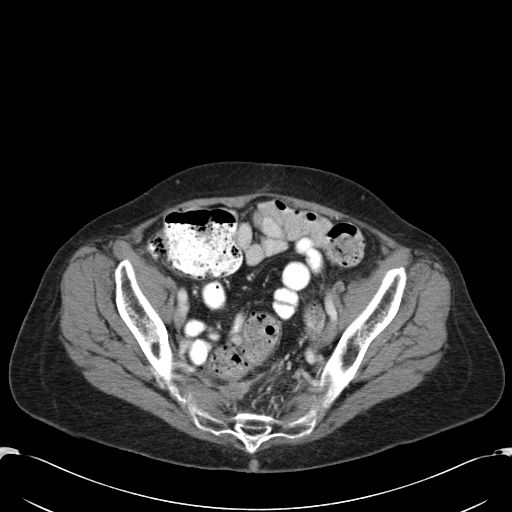
[im 38/80  soft-tissue]
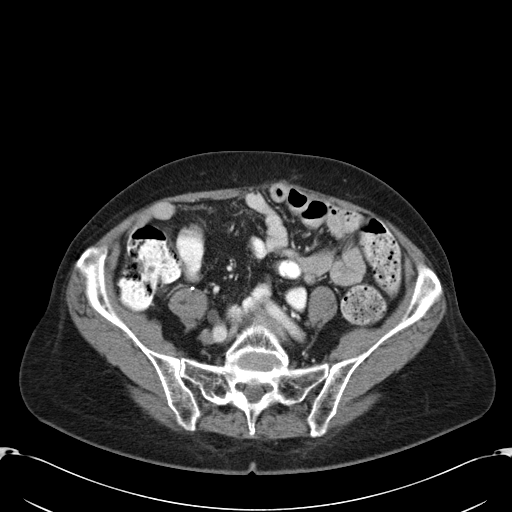
[im 42/80  soft-tissue]
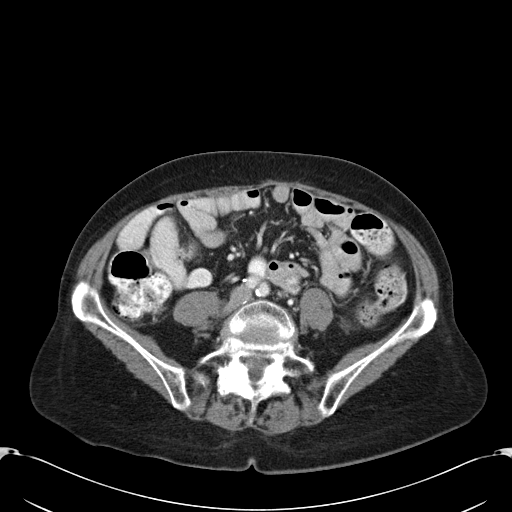
[im 50/80  soft-tissue]
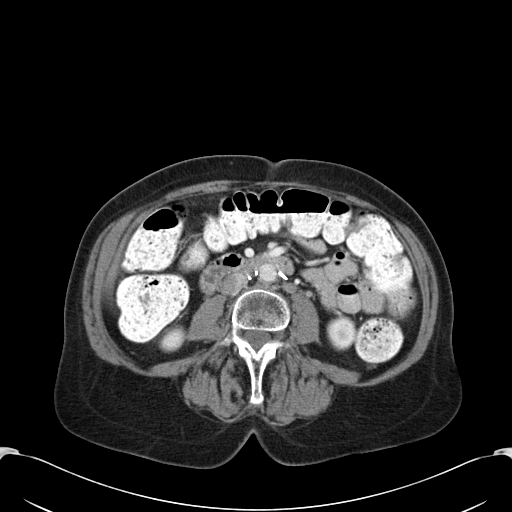
[im 55/80  soft-tissue]
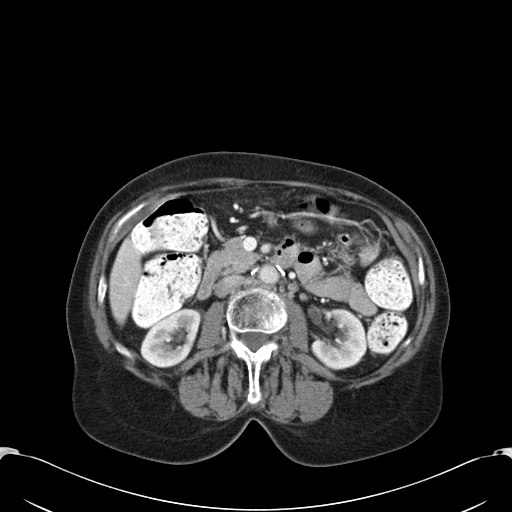
[im 55/80  bone]
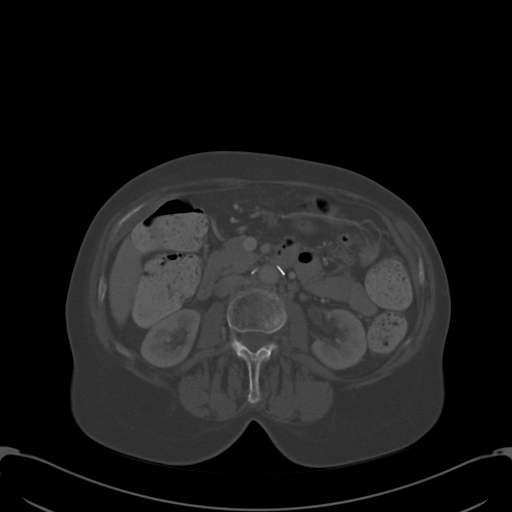
[im 63/80  soft-tissue]
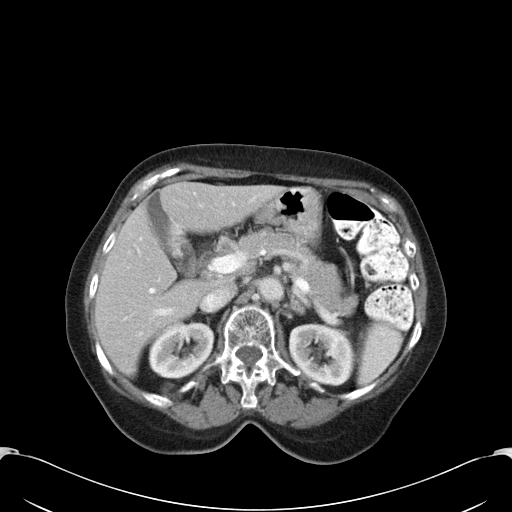
[im 63/80  lung]
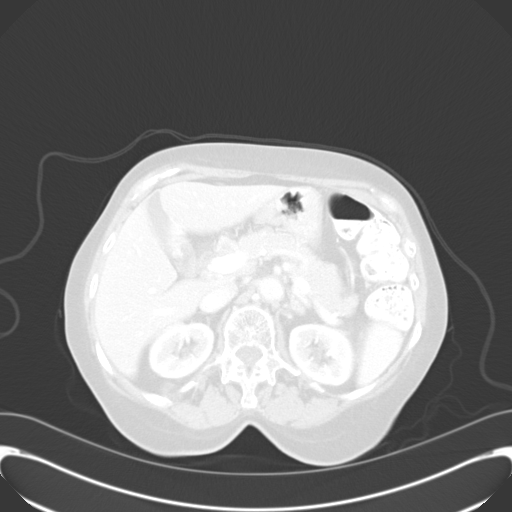
[im 67/80  soft-tissue]
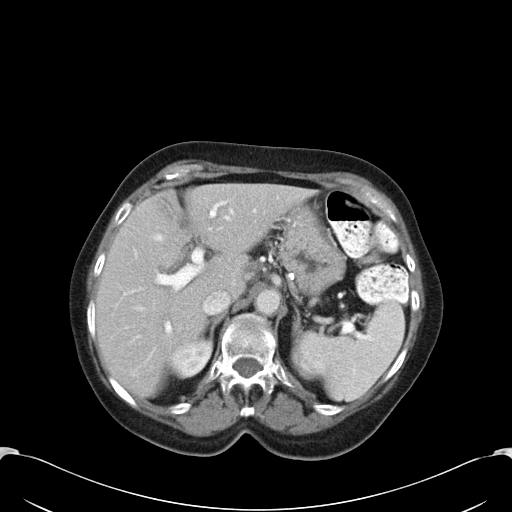
[im 67/80  lung]
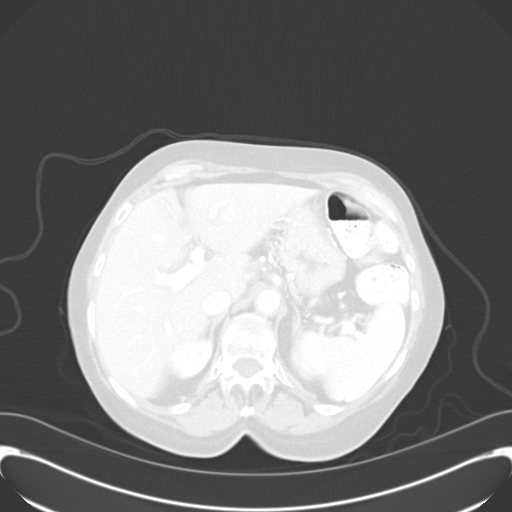
[im 71/80  lung]
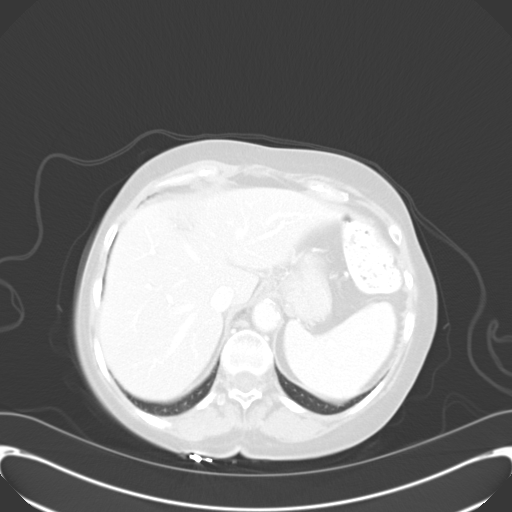
[im 75/80  soft-tissue]
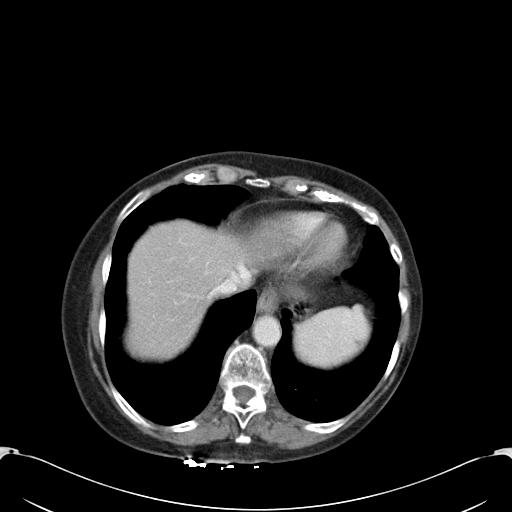
[im 75/80  lung]
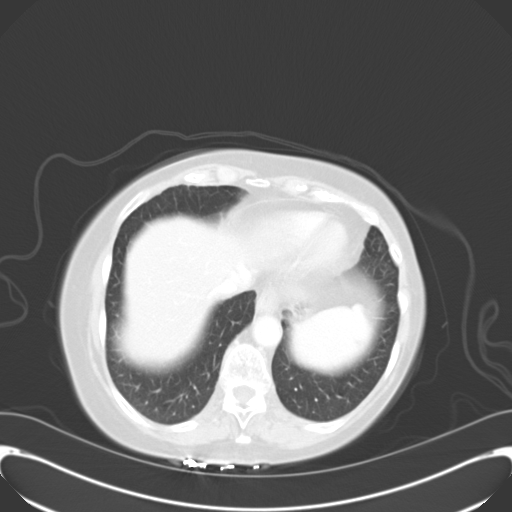

[13 of 32 positions shown; findings below may reference images not displayed]

FINDINGS: Lower chest: The lung bases are clear. All pleural effusion and
pulmonary nodule. The heart is normal in size. No pericardial
effusion. The distal esophagus is grossly normal. There is a small
hiatal hernia.

Hepatobiliary: There is a stable lesion in the falciform ligament
measuring 18 x 18 mm. Previously measured 20 x 17 mm. I do not see
any other peritoneal implants involving the liver. There is
associated mild biliary dilatation distal to the lesion. The
gallbladder is normal. Normal caliber common bile duct.

Pancreas: No mass, inflammation or ductal dilatation.

Spleen: Normal size.  No focal lesions.

Adrenals/Urinary Tract: The adrenal glands are normal.

The kidneys are normal and stable.

Stomach/Bowel: The stomach, duodenum, small bowel and colon are
unremarkable. No inflammatory changes, mass lesions or obstructive
findings.

Vascular/Lymphatic: Stable atherosclerotic calcifications involving
the aorta. The branch vessels are patent. The major venous
structures are patent.

Stable ill-defined soft tissue lesion in the celiac axis area
measuring 22 x 16 mm. It previously measured 25 x 18 mm.

Numerous small retroperitoneal lymph nodes are again noted. A 7 mm
node on image 24 previously measured 8 mm a 6.5 mm node on image
number 27 is unchanged. 6 mm node on image 30 is unchanged.

Other: The uterus and ovaries are surgically absent. Stable surgical
clips. Soft tissue density in the left pelvis on image number 50
measures 18.5 x 16 mm. This is stable. A right pelvic soft tissue
lesion on image number 51 measures 19.5 x 14.5 mm and is also
stable. No new pelvic lesions. No inguinal mass or adenopathy. The
bladder appears normal.

Musculoskeletal: No significant bony findings. Stable L3 compression
fracture.
IMPRESSION: Overall stable hepatic, nodal an peritoneal disease as described
above.

## 2016-08-12 ENCOUNTER — Other Ambulatory Visit: Payer: Self-pay | Admitting: *Deleted

## 2016-08-12 DIAGNOSIS — C569 Malignant neoplasm of unspecified ovary: Secondary | ICD-10-CM

## 2016-08-13 ENCOUNTER — Ambulatory Visit: Payer: Medicare Other

## 2016-08-13 ENCOUNTER — Telehealth: Payer: Self-pay | Admitting: Oncology

## 2016-08-13 ENCOUNTER — Other Ambulatory Visit (HOSPITAL_BASED_OUTPATIENT_CLINIC_OR_DEPARTMENT_OTHER): Payer: Medicare Other

## 2016-08-13 ENCOUNTER — Ambulatory Visit (HOSPITAL_BASED_OUTPATIENT_CLINIC_OR_DEPARTMENT_OTHER): Payer: Medicare Other | Admitting: Oncology

## 2016-08-13 VITALS — BP 137/65 | HR 69 | Temp 98.3°F | Resp 18 | Wt 114.3 lb

## 2016-08-13 DIAGNOSIS — C787 Secondary malignant neoplasm of liver and intrahepatic bile duct: Secondary | ICD-10-CM

## 2016-08-13 DIAGNOSIS — C772 Secondary and unspecified malignant neoplasm of intra-abdominal lymph nodes: Secondary | ICD-10-CM

## 2016-08-13 DIAGNOSIS — C569 Malignant neoplasm of unspecified ovary: Secondary | ICD-10-CM

## 2016-08-13 LAB — COMPREHENSIVE METABOLIC PANEL WITH GFR
ALT: 11 U/L (ref 0–55)
AST: 20 U/L (ref 5–34)
Albumin: 3.7 g/dL (ref 3.5–5.0)
Alkaline Phosphatase: 70 U/L (ref 40–150)
Anion Gap: 8 meq/L (ref 3–11)
BUN: 20.3 mg/dL (ref 7.0–26.0)
CO2: 25 meq/L (ref 22–29)
Calcium: 9.8 mg/dL (ref 8.4–10.4)
Chloride: 106 meq/L (ref 98–109)
Creatinine: 0.9 mg/dL (ref 0.6–1.1)
EGFR: 65 ml/min/1.73 m2 — ABNORMAL LOW
Glucose: 73 mg/dL (ref 70–140)
Potassium: 4.8 meq/L (ref 3.5–5.1)
Sodium: 140 meq/L (ref 136–145)
Total Bilirubin: 0.68 mg/dL (ref 0.20–1.20)
Total Protein: 6.8 g/dL (ref 6.4–8.3)

## 2016-08-13 LAB — CBC WITH DIFFERENTIAL/PLATELET
BASO%: 0.5 % (ref 0.0–2.0)
BASOS ABS: 0 10*3/uL (ref 0.0–0.1)
EOS%: 0.7 % (ref 0.0–7.0)
Eosinophils Absolute: 0 10*3/uL (ref 0.0–0.5)
HEMATOCRIT: 34.7 % — AB (ref 34.8–46.6)
HGB: 11.3 g/dL — ABNORMAL LOW (ref 11.6–15.9)
LYMPH#: 1.2 10*3/uL (ref 0.9–3.3)
LYMPH%: 21.5 % (ref 14.0–49.7)
MCH: 30.9 pg (ref 25.1–34.0)
MCHC: 32.6 g/dL (ref 31.5–36.0)
MCV: 94.8 fL (ref 79.5–101.0)
MONO#: 0.4 10*3/uL (ref 0.1–0.9)
MONO%: 7.3 % (ref 0.0–14.0)
NEUT#: 4 10*3/uL (ref 1.5–6.5)
NEUT%: 70 % (ref 38.4–76.8)
PLATELETS: 99 10*3/uL — AB (ref 145–400)
RBC: 3.66 10*6/uL — ABNORMAL LOW (ref 3.70–5.45)
RDW: 15.9 % — ABNORMAL HIGH (ref 11.2–14.5)
WBC: 5.7 10*3/uL (ref 3.9–10.3)

## 2016-08-13 MED ORDER — LISINOPRIL 20 MG PO TABS: 20.0000 mg | ORAL_TABLET | Freq: Every day | ORAL | 0 refills | Status: DC

## 2016-08-13 MED ORDER — DEXAMETHASONE 4 MG PO TABS: ORAL_TABLET | ORAL | 4 refills | Status: AC

## 2016-08-13 MED ORDER — FOLIC ACID 1 MG PO TABS: 1.0000 mg | ORAL_TABLET | Freq: Every day | ORAL | 4 refills | Status: AC

## 2016-08-13 MED ORDER — VITAMIN B-12 1000 MCG PO TABS: 1000.0000 ug | ORAL_TABLET | Freq: Every day | ORAL | 4 refills | Status: AC

## 2016-08-13 MED ORDER — HYDROCHLOROTHIAZIDE 12.5 MG PO CAPS: 12.5000 mg | ORAL_CAPSULE | Freq: Every day | ORAL | 0 refills | Status: DC

## 2016-08-13 NOTE — Telephone Encounter (Signed)
appt made and calendar printed

## 2016-08-13 NOTE — Progress Notes (Signed)
ID: Cassandra Nichols   DOB: 01/31/1941  MR#: [id number]  GLO#:[id number]   PCP:  Marjorie Smolder, MD GYN:  [name], MD OTHER:  [name], MD  CHIEF COMPLAINT:  Recurrent Ovarian Cancer  CURRENT TREATMENT:  [Abraxane]  NOTE: THIS PATIENT REQUESTS THAT ABSOLUTELY NO NUMERIC INFORMATION BE GIVEN REGARDING ANY LAB AND PARTICULARLY DOES NOT WANT TO KNOW SPECIFIC CA-125 RESULTS  HISTORY OF PRESENT ILLNESS: From the earlier summary note:   [name] was initially diagnosed with ovarian cancer in July of 2002. She underwent debulking surgery at that time for what proved to be a 4 of 18 lymph nodes positive disease. She was initially treated with 6 cycles of paclitaxel and carboplatin adjuvantly, completed in December of 2002.  Patient has had multiple recurrences since December of 2002, and has been treated with multiple agents, as detailed below.  INTERVAL HISTORY: [name] returns today for follow up of her ovarian cancer. Today is day 21 cycle 6 of Abraxane, of which she has received a total of 10 doses. They have been some irregularities in how she receives this drug because of family trips, but she was supposed to be treated again tomorrow. However we are going to discontinue this treatment because of neuropathy concerns.  REVIEW OF SYSTEMS: She feels like her feet are wrapped in parchment paper. She actually has good sensation on her toes despite that. However she is having more balance problems. This is referral to not quite knowing were her feet are. Aside from that, she is eating well, has a good sense of taste, and fair energy. She just travel or to [place] for 2 weekends in a row to watch her grandchildren graduate and play in a band. She is planning to visit her son in [place]. She is currently between jobs as her employment is very seasonal. A detailed review of systems today was otherwise stable  PAST MEDICAL HISTORY: Past Medical History:  Diagnosis Date  . Cancer ([place])   .  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 Ovarian cancer ([place]) 11/17/2011  . Shingles     PAST SURGICAL HISTORY: Past Surgical History:  Procedure Laterality Date  . GANGLION CYST EXCISION Left    Wrist    FAMILY HISTORY Family History  Problem Relation Age of Onset  . Hypertension Mother   . GER disease Mother   . Cancer Father     Cancer (stomach)   the patient's father died at the age of 91, from cancer that may have it started in his abdomen. He had 8 siblings including 2 sisters with breast cancer. The patient's mother had by Dr. cancer, but died from unrelated causes at the age of 52. The patient's maternal grandmother may have had ovarian cancer. The patient is an only child   GYNECOLOGIC HISTORY: Menarche age 89, first live birth age 24. The patient is GX P1. She went through the change of life around age 12. She subsequently had a TAH/BSO was part of her ovarian cancer treatment.  SOCIAL HISTORY: (Updated 03/08/2014) [name] lives alone. She works temporary jobs, primarily Nutritional therapist.  Her (adopted) daughter [name] lives [place] and is Mudlogger of the epidemiology and [employer] there; she is married with 2 children. Son  [name] lives in [place], [place]. He has 4 children of his own. He is Tree surgeon for a [employer]. His wife is a Marine scientist   ADVANCED DIRECTIVES: At the 01/16/2016 visit [name] tells me that her  son [name] is her healthcare power of attorney. He can be reached at [phone]. [Her daughter [name] is her financial power of attorney.]  HEALTH MAINTENANCE:  (Updated  03/08/2014) Social History  Substance Use Topics  . Smoking status: Former Research scientist (life sciences)  . Smokeless tobacco: Never Used  . Alcohol use No     Comment: Occasionally     Colonoscopy:   PAP: s/p hysterectomy BSO  Bone density:   Mammogram:  09/16/2015   Allergies  Allergen Reactions  . Penicillins Rash    Current Outpatient Prescriptions  Medication Sig Dispense Refill  .  docusate sodium (COLACE) 100 MG capsule Take 100 mg by mouth 2 (two) times daily.    . fluticasone (FLONASE) 50 MCG/ACT nasal spray Place 1 spray into both nostrils as needed for allergies or rhinitis.    . hydrochlorothiazide (MICROZIDE) 12.5 MG capsule TAKE 1 CAPSULE BY MOUTH DAILY 30 capsule 0  . lisinopril (PRINIVIL,ZESTRIL) 20 MG tablet TAKE 1 TABLET BY MOUTH EVERY DAY 90 tablet 0  . loratadine (CLARITIN) 10 MG tablet Take 10 mg by mouth daily as needed for allergies.    . Melatonin 3 MG TABS Take 3 mg by mouth at bedtime as needed. Reported on 03/12/2016    . prochlorperazine (COMPAZINE) 10 MG tablet Take 1 tablet (10 mg total) by mouth every 6 (six) hours as needed for nausea or vomiting. 30 tablet 6   No current facility-administered medications for this visit.     OBJECTIVE: Middle-aged white woman In no acute distress Vitals:   08/13/16 0944  BP: 137/65  Pulse: 69  Resp: 18  Temp: 98.3 F (36.8 C)     Body mass index is 23.09 kg/m.    ECOG FS: 1  Sclerae unicteric, EOMs intact Oropharynx clear and moist No cervical or supraclavicular adenopathy Lungs no rales or rhonchi Heart regular rate and rhythm Abd soft, nontender, positive bowel sounds, no masses palpated MSK no focal spinal tenderness, no upper extremity lymphedema Neuro: nonfocal, well oriented, appropriate affect; she can feel light touch in the pads of her toes Breasts: Deferred  LAB RESULTS:  Results for TERRIANNE, CAVNESS (MRN [id number]) as of 07/15/2016 17:31  Ref. Range 01/16/2016 10:49 02/13/2016 15:02 03/12/2016 14:25 04/09/2016 14:55 05/14/2016 14:12  Cancer Antigen (CA) 125 Latest Ref Range: 0.0 - 38.1 U/mL 3,224.0 (H) 1,637.0 (H) 1,201.0 (H) 740.9 (H) 552.2 (H)   Lab Results  Component Value Date   WBC 5.7 08/13/2016   NEUTROABS 4.0 08/13/2016   HGB 11.3 (L) 08/13/2016   HCT 34.7 (L) 08/13/2016   MCV 94.8 08/13/2016   PLT 99 (L) 08/13/2016        Chemistry      Component Value Date/Time   NA 139  06/25/2016 1439   K 4.4 06/25/2016 1439   CL 106 05/09/2013 1304   CO2 26 06/25/2016 1439   BUN 24.2 06/25/2016 1439   CREATININE 1.1 06/25/2016 1439      Component Value Date/Time   CALCIUM 9.6 06/25/2016 1439   ALKPHOS 69 06/25/2016 1439   AST 19 06/25/2016 1439   ALT 11 06/25/2016 1439   BILITOT 0.59 06/25/2016 1439               STUDIES: No results found.   ASSESSMENT: 75 y.o.  [place] woman (1)  with a history of ovarian cancer dating back to July 2002 when she underwent debulking surgery for what proved to be 4/18 lymph node positive disease.   (2)  She  received 6 cycles of paclitaxel/carboplatin adjuvantly completed in December 2002.   (3)  She had her first recurrence in December 2005 treated with paclitaxel;   (4)  second recurrence May 2007 treated with [place] and paclitaxel;   (5)  third recurrence January 2010 treated with topotecan and Gemzar;   (6)  fourth recurrence January 2012 treated with single-agent carboplatin (January to August 2012) with initial response, then plateau; with progression January 2013 (less than 6 months after last carbo dose) treated with single-agent gemzar given Q2 weeks (January to April 2013), with evidence of progression  (7) Doxil strartedJuly 2013 6, with evidence of initial response, and fair tolerance. Held between May and August 2014 due to patient's work schedule and scheduling issues.  Resumed treatment as of 08/24/2013, with some delays secondary to the patient's schedule. As of early December 2014, her total cumulative dose of Doxil reached 560 mg per meter square. In addition there was also an upward trend of the CA 125. Doxil was discontinued (last dose 10/30/2013). Echocardiogram on 11/14/2013 showed a well preserved ejection fraction of 55-60%.  (8)  treated with single agent paclitaxel, given on days one and 8 of each 21 day cycle, first dose on 12/27/2013,with continuing response, but mild to moderate toxicity; 11th  and last dose 04/12/2014   (9) carboplatin and cyclophosphamide started on 08/28/14 given every 21 days-- stopped 10/10/2014 after a reaction to carboplatin (also no evidence of response)  (10) cyclophosphamide and bevacizumab started 12/02/2014, stopped 12/26/2015 with progression  (a) CT scan of the abdomen and pelvis 06/19/2015 shows stable disease  (b) CT of the chest/abd/pelvis 09/12/2015 stable  (c) PET scan 01/15/2016 shows progression in the liver and upper abdominal nodes  (11) abraxane started 01/16/2016,  repeated every 14 days with evidence of response  (a) last dose 06/25/2016, discontinued because of neuropathy  (12) offered genetics testing at the 01/16/2016 visit, but declined  PLAN:  Blenda Wisecup has benefited greatly from the break in treatment that she has had. She has more energy, has gained some weight, I spent time with her family, and is now ready to resume therapy.  We discussed the fact that she did very well with the Abraxane. Certainly we can come back to the drug in the future with expectation of response. 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 This is not disabling at present, but could become so. Accordingly we are not continuing the Abraxane at this point.   We discussed 2 options which she has not had so far. One of them is overlap.. There was recently approved for patients would do not carry a BRCA mutation. The other one is pemetrexed, which has been shown to be effective in these situations. It is difficult to know which one would be easier on her but I suggest the pemetrexed would be, and it also has the advantage that we will have a record of compliance.  We discussed the possible toxicities, side effects and complications of pemetrexed. I have asked her to start folate 1 mg a day today and B12 thousand milligrams daily today as well. These are over-the-counter medicines but I went ahead and called him in any way or her.  She has a  complicated work and travel schedule him a but we should be able to start treating her September 8 and then again September 29. In between she will be mostly out of town.  She will then return to see me on 10/16/2016, with  her third dose. Of course we will be following her CA-125 on a regular basis. She knows to call for any problems that may develop before the next visit  If we do not get a good response to the Alimta, we will switch to olaparib   Chauncey Cruel, MD    08/13/2016

## 2016-08-18 ENCOUNTER — Telehealth: Payer: Self-pay | Admitting: Oncology

## 2016-08-18 NOTE — Telephone Encounter (Signed)
Dr. [name] request to stop receiving office notes for this pt due to no longer seeing this pt.

## 2016-08-25 ENCOUNTER — Other Ambulatory Visit: Payer: Self-pay | Admitting: Oncology

## 2016-08-25 ENCOUNTER — Telehealth: Payer: Self-pay | Admitting: *Deleted

## 2016-08-25 NOTE — Telephone Encounter (Signed)
Per patietn request I have moved appts from 9/8 to tomorrow. Patient given new appts

## 2016-08-25 NOTE — Telephone Encounter (Signed)
Pt has not had alimta yet.  This will be new drug for her.  She will need to start & not wait until 09/17/16.  Message given to pt & pt was r/s.

## 2016-08-25 NOTE — Telephone Encounter (Signed)
Received call from pt stating that she needs to change her appt from fri @ 2:15 to Thurs AM.  She thinks she is getting a new drug.  Reviewed Dr Magrinat's OV note & he states that if she doesn't have a good response to the alimta he will switch to olaparib.  Informed pt that I would discuss with Dr Jana Hakim.  She states that if he is not switching, could she wait until the 29th to start back treatment.  Message to Dr Jana Hakim.

## 2016-08-26 ENCOUNTER — Ambulatory Visit: Payer: Medicare Other

## 2016-08-26 ENCOUNTER — Other Ambulatory Visit (HOSPITAL_BASED_OUTPATIENT_CLINIC_OR_DEPARTMENT_OTHER): Payer: Medicare Other

## 2016-08-26 DIAGNOSIS — C569 Malignant neoplasm of unspecified ovary: Secondary | ICD-10-CM | POA: Diagnosis not present

## 2016-08-26 LAB — CBC WITH DIFFERENTIAL/PLATELET
BASO%: 0 % (ref 0.0–2.0)
BASOS ABS: 0 10*3/uL (ref 0.0–0.1)
EOS ABS: 0 10*3/uL (ref 0.0–0.5)
EOS%: 0 % (ref 0.0–7.0)
HEMATOCRIT: 31.1 % — AB (ref 34.8–46.6)
HEMOGLOBIN: 10.4 g/dL — AB (ref 11.6–15.9)
LYMPH#: 0.9 10*3/uL (ref 0.9–3.3)
LYMPH%: 14 % (ref 14.0–49.7)
MCH: 30.9 pg (ref 25.1–34.0)
MCHC: 33.4 g/dL (ref 31.5–36.0)
MCV: 92.3 fL (ref 79.5–101.0)
MONO#: 0.1 10*3/uL (ref 0.1–0.9)
MONO%: 2.1 % (ref 0.0–14.0)
NEUT#: 5.3 10*3/uL (ref 1.5–6.5)
NEUT%: 83.9 % — AB (ref 38.4–76.8)
PLATELETS: 94 10*3/uL — AB (ref 145–400)
RBC: 3.37 10*6/uL — ABNORMAL LOW (ref 3.70–5.45)
RDW: 15.5 % — ABNORMAL HIGH (ref 11.2–14.5)
WBC: 6.3 10*3/uL (ref 3.9–10.3)
nRBC: 0 % (ref 0–0)

## 2016-08-26 LAB — COMPREHENSIVE METABOLIC PANEL
ALBUMIN: 3.6 g/dL (ref 3.5–5.0)
ALK PHOS: 63 U/L (ref 40–150)
ALT: 10 U/L (ref 0–55)
ANION GAP: 8 meq/L (ref 3–11)
AST: 16 U/L (ref 5–34)
BUN: 30.5 mg/dL — ABNORMAL HIGH (ref 7.0–26.0)
CALCIUM: 9.4 mg/dL (ref 8.4–10.4)
CO2: 23 mEq/L (ref 22–29)
Chloride: 106 mEq/L (ref 98–109)
Creatinine: 0.9 mg/dL (ref 0.6–1.1)
EGFR: 64 mL/min/{1.73_m2} — AB (ref >90)
Glucose: 142 mg/dl — ABNORMAL HIGH (ref 70–140)
POTASSIUM: 4.5 meq/L (ref 3.5–5.1)
SODIUM: 138 meq/L (ref 136–145)
Total Bilirubin: 0.62 mg/dL (ref 0.20–1.20)
Total Protein: 6.7 g/dL (ref 6.4–8.3)

## 2016-08-26 NOTE — Progress Notes (Signed)
Platelets 94. Dr. Jana Hakim notified. Ok to treat. Prior authorization has not been obtained. Patient will return tomorrow (08/27/2016) for treatment.

## 2016-08-27 ENCOUNTER — Ambulatory Visit: Payer: Medicare Other

## 2016-08-27 ENCOUNTER — Other Ambulatory Visit: Payer: Medicare Other

## 2016-08-27 ENCOUNTER — Ambulatory Visit (HOSPITAL_BASED_OUTPATIENT_CLINIC_OR_DEPARTMENT_OTHER): Payer: Medicare Other

## 2016-08-27 VITALS — BP 103/52 | HR 67 | Temp 98.6°F | Resp 17

## 2016-08-27 DIAGNOSIS — Z5111 Encounter for antineoplastic chemotherapy: Secondary | ICD-10-CM | POA: Diagnosis not present

## 2016-08-27 DIAGNOSIS — C772 Secondary and unspecified malignant neoplasm of intra-abdominal lymph nodes: Secondary | ICD-10-CM

## 2016-08-27 DIAGNOSIS — C787 Secondary malignant neoplasm of liver and intrahepatic bile duct: Secondary | ICD-10-CM

## 2016-08-27 DIAGNOSIS — C569 Malignant neoplasm of unspecified ovary: Secondary | ICD-10-CM | POA: Diagnosis not present

## 2016-08-27 LAB — CA 125

## 2016-08-27 MED ORDER — PROCHLORPERAZINE MALEATE 10 MG PO TABS
ORAL_TABLET | ORAL | Status: AC
  Filled 2016-08-27: qty 1

## 2016-08-27 MED ORDER — SODIUM CHLORIDE 0.9% FLUSH
10.0000 mL | INTRAVENOUS | Status: DC | PRN
  Administered 2016-08-27: 10 mL
  Filled 2016-08-27: qty 10

## 2016-08-27 MED ORDER — PROCHLORPERAZINE MALEATE 10 MG PO TABS
10.0000 mg | ORAL_TABLET | Freq: Once | ORAL | Status: AC
  Administered 2016-08-27: 10 mg via ORAL

## 2016-08-27 MED ORDER — SODIUM CHLORIDE 0.9 % IV SOLN
480.0000 mg/m2 | Freq: Once | INTRAVENOUS | Status: AC
  Administered 2016-08-27: 700 mg via INTRAVENOUS
  Filled 2016-08-27: qty 20

## 2016-08-27 MED ORDER — SODIUM CHLORIDE 0.9 % IV SOLN
Freq: Once | INTRAVENOUS | Status: AC
  Administered 2016-08-27: 15:00:00 via INTRAVENOUS

## 2016-08-27 MED ORDER — HEPARIN SOD (PORK) LOCK FLUSH 100 UNIT/ML IV SOLN
500.0000 [IU] | Freq: Once | INTRAVENOUS | Status: AC | PRN
  Administered 2016-08-27: 500 [IU]
  Filled 2016-08-27: qty 5

## 2016-08-27 NOTE — Patient Instructions (Signed)
[facility] Discharge Instructions for Patients Receiving Chemotherapy  Today you received the following chemotherapy agents:  Alimta (pemetrexed)  To help prevent nausea and vomiting after your treatment, we encourage you to take your nausea medication as prescribed.   If you develop nausea and vomiting that is not controlled by your nausea medication, call the clinic.   BELOW ARE SYMPTOMS THAT SHOULD BE REPORTED IMMEDIATELY:  *FEVER GREATER THAN 100.5 F  *CHILLS WITH OR WITHOUT FEVER  NAUSEA AND VOMITING THAT IS NOT CONTROLLED WITH YOUR NAUSEA MEDICATION  *UNUSUAL SHORTNESS OF BREATH  *UNUSUAL BRUISING OR BLEEDING  TENDERNESS IN MOUTH AND THROAT WITH OR WITHOUT PRESENCE OF ULCERS  *URINARY PROBLEMS  *BOWEL PROBLEMS  UNUSUAL RASH Items with * indicate a potential emergency and should be followed up as soon as possible.  Feel free to call the clinic you have any questions or concerns. The clinic phone number is [phone].  Please show the [facility] at check-in to the Emergency Department and triage nurse.  Pemetrexed injection What is this medicine? PEMETREXED (PEM e TREX ed) is a chemotherapy drug. This medicine affects cells that are rapidly growing, such as cancer cells and cells in your mouth and stomach. It is usually used to treat lung cancers like non-small cell lung cancer and mesothelioma. It may also be used to treat other cancers. This medicine may be used for other purposes; ask your health care provider or pharmacist if you have questions. What should I tell my health care provider before I take this medicine? They need to know if you have any of these conditions: -if you frequently drink alcohol containing beverages -infection (especially a virus infection such as chickenpox, cold sores, or herpes) -kidney disease -liver disease -low blood counts, like low platelets, red bloods, or white blood cells -an unusual or allergic reaction  to pemetrexed, mannitol, other medicines, foods, dyes, or preservatives -pregnant or trying to get pregnant -breast-feeding How should I use this medicine? This drug is given as an infusion into a vein. It is administered in a hospital or clinic by a specially trained health care professional. Talk to your pediatrician regarding the use of this medicine in children. Special care may be needed. Overdosage: If you think you have taken too much of this medicine contact a poison control center or emergency room at once. NOTE: This medicine is only for you. Do not share this medicine with others. What if I miss a dose? It is important not to miss your dose. Call your doctor or health care professional if you are unable to keep an appointment. What may interact with this medicine? -aspirin and aspirin-like medicines -medicines to increase blood counts like filgrastim, pegfilgrastim, sargramostim -methotrexate -NSAIDS, medicines for pain and inflammation, like ibuprofen or naproxen -probenecid -pyrimethamine -vaccines Talk to your doctor or health care professional before taking any of these medicines: -acetaminophen -aspirin -ibuprofen -ketoprofen -naproxen This list may not describe all possible interactions. Give your health care provider a list of all the medicines, herbs, non-prescription drugs, or dietary supplements you use. Also tell them if you smoke, drink alcohol, or use illegal drugs. Some items may interact with your medicine. What should I watch for while using this medicine? Visit your doctor for checks on your progress. This drug may make you feel generally unwell. This is not uncommon, as chemotherapy can affect healthy cells as well as cancer cells. Report any side effects. Continue your course of treatment even though you feel  ill unless your doctor tells you to stop. In some cases, you may be given additional medicines to help with side effects. Follow all directions for their  use. Call your doctor or health care professional for advice if you get a fever, chills or sore throat, or other symptoms of a cold or flu. Do not treat yourself. This drug decreases your body's ability to fight infections. Try to avoid being around people who are sick. This medicine may increase your risk to bruise or bleed. Call your doctor or health care professional if you notice any unusual bleeding. Be careful brushing and flossing your teeth or using a toothpick because you may get an infection or bleed more easily. If you have any dental work done, tell your dentist you are receiving this medicine. Avoid taking products that contain aspirin, acetaminophen, ibuprofen, naproxen, or ketoprofen unless instructed by your doctor. These medicines may hide a fever. Call your doctor or health care professional if you get diarrhea or mouth sores. Do not treat yourself. To protect your kidneys, drink water or other fluids as directed while you are taking this medicine. Men and women must use effective birth control while taking this medicine. You may also need to continue using effective birth control for a time after stopping this medicine. Do not become pregnant while taking this medicine. Tell your doctor right away if you think that you or your partner might be pregnant. There is a potential for serious side effects to an unborn child. Talk to your health care professional or pharmacist for more information. Do not breast-feed an infant while taking this medicine. This medicine may lower sperm counts. What side effects may I notice from receiving this medicine? Side effects that you should report to your doctor or health care professional as soon as possible: -allergic reactions like skin rash, itching or hives, swelling of the face, lips, or tongue -low blood counts - this medicine may decrease the number of white blood cells, red blood cells and platelets. You may be at increased risk for infections  and bleeding. -signs of infection - fever or chills, cough, sore throat, pain or difficulty passing urine -signs of decreased platelets or bleeding - bruising, pinpoint red spots on the skin, black, tarry stools, blood in the urine -signs of decreased red blood cells - unusually weak or tired, fainting spells, lightheadedness -breathing problems, like a dry cough -changes in emotions or moods -chest pain -confusion -diarrhea -high blood pressure -mouth or throat sores or ulcers -pain, swelling, warmth in the leg -pain on swallowing -swelling of the ankles, feet, hands -trouble passing urine or change in the amount of urine -vomiting -yellowing of the eyes or skin Side effects that usually do not require medical attention (report to your doctor or health care professional if they continue or are bothersome): -hair loss -loss of appetite -nausea -stomach upset This list may not describe all possible side effects. Call your doctor for medical advice about side effects. You may report side effects to FDA at 1-800-FDA-1088. Where should I keep my medicine? This drug is given in a hospital or clinic and will not be stored at home. NOTE: This sheet is a summary. It may not cover all possible information. If you have questions about this medicine, talk to your doctor, pharmacist, or health care provider.    2016, [employer]. (2008-07-09 13:24:03)

## 2016-09-17 ENCOUNTER — Other Ambulatory Visit: Payer: Medicare Other

## 2016-09-17 ENCOUNTER — Ambulatory Visit: Payer: Medicare Other

## 2016-09-24 ENCOUNTER — Other Ambulatory Visit: Payer: Medicare Other

## 2016-09-24 ENCOUNTER — Ambulatory Visit: Payer: Medicare Other

## 2016-09-24 ENCOUNTER — Ambulatory Visit: Payer: Medicare Other | Admitting: Oncology

## 2016-09-29 ENCOUNTER — Other Ambulatory Visit (HOSPITAL_COMMUNITY)
Admission: RE | Admit: 2016-09-29 | Discharge: 2016-09-29 | Disposition: A | Discharge status: Home / Self Care | Payer: Medicare Other | Source: Ambulatory Visit | Attending: Oncology | Admitting: Oncology

## 2016-09-29 ENCOUNTER — Other Ambulatory Visit (HOSPITAL_BASED_OUTPATIENT_CLINIC_OR_DEPARTMENT_OTHER): Payer: Medicare Other

## 2016-09-29 ENCOUNTER — Ambulatory Visit: Payer: Medicare Other

## 2016-09-29 ENCOUNTER — Ambulatory Visit (HOSPITAL_BASED_OUTPATIENT_CLINIC_OR_DEPARTMENT_OTHER): Payer: Medicare Other

## 2016-09-29 VITALS — BP 127/62 | HR 73 | Temp 98.2°F | Resp 16

## 2016-09-29 DIAGNOSIS — C569 Malignant neoplasm of unspecified ovary: Secondary | ICD-10-CM

## 2016-09-29 DIAGNOSIS — C787 Secondary malignant neoplasm of liver and intrahepatic bile duct: Secondary | ICD-10-CM

## 2016-09-29 DIAGNOSIS — C772 Secondary and unspecified malignant neoplasm of intra-abdominal lymph nodes: Secondary | ICD-10-CM | POA: Diagnosis not present

## 2016-09-29 DIAGNOSIS — Z5111 Encounter for antineoplastic chemotherapy: Secondary | ICD-10-CM | POA: Diagnosis not present

## 2016-09-29 LAB — COMPREHENSIVE METABOLIC PANEL
ALBUMIN: 4 g/dL (ref 3.5–5.0)
ALT: 14 U/L (ref 14–54)
ANION GAP: 9 (ref 5–15)
AST: 24 U/L (ref 15–41)
Alkaline Phosphatase: 67 U/L (ref 38–126)
BUN: 30 mg/dL — ABNORMAL HIGH (ref 6–20)
CHLORIDE: 106 mmol/L (ref 101–111)
CO2: 23 mmol/L (ref 22–32)
Calcium: 9.2 mg/dL (ref 8.9–10.3)
Creatinine, Ser: 1.1 mg/dL — ABNORMAL HIGH (ref 0.44–1.00)
GFR calc non Af Amer: 48 mL/min — ABNORMAL LOW (ref >60)
GFR, EST AFRICAN AMERICAN: 56 mL/min — AB (ref >60)
Glucose, Bld: 102 mg/dL — ABNORMAL HIGH (ref 65–99)
Potassium: 4.6 mmol/L (ref 3.5–5.1)
SODIUM: 138 mmol/L (ref 135–145)
Total Bilirubin: 0.7 mg/dL (ref 0.3–1.2)
Total Protein: 6.8 g/dL (ref 6.5–8.1)

## 2016-09-29 LAB — CBC WITH DIFFERENTIAL/PLATELET
BASO%: 0 % (ref 0.0–2.0)
BASOS ABS: 0 10*3/uL (ref 0.0–0.1)
EOS%: 0 % (ref 0.0–7.0)
Eosinophils Absolute: 0 10*3/uL (ref 0.0–0.5)
HEMATOCRIT: 30.5 % — AB (ref 34.8–46.6)
HEMOGLOBIN: 10.2 g/dL — AB (ref 11.6–15.9)
LYMPH%: 10.1 % — ABNORMAL LOW (ref 14.0–49.7)
MCH: 31.7 pg (ref 25.1–34.0)
MCHC: 33.4 g/dL (ref 31.5–36.0)
MCV: 94.7 fL (ref 79.5–101.0)
MONO#: 0.2 10*3/uL (ref 0.1–0.9)
MONO%: 2.7 % (ref 0.0–14.0)
NEUT#: 7.3 10*3/uL — ABNORMAL HIGH (ref 1.5–6.5)
NEUT%: 87.2 % — ABNORMAL HIGH (ref 38.4–76.8)
Platelets: 132 10*3/uL — ABNORMAL LOW (ref 145–400)
RBC: 3.22 10*6/uL — ABNORMAL LOW (ref 3.70–5.45)
RDW: 17.7 % — ABNORMAL HIGH (ref 11.2–14.5)
WBC: 8.4 10*3/uL (ref 3.9–10.3)
lymph#: 0.9 10*3/uL (ref 0.9–3.3)

## 2016-09-29 MED ORDER — SODIUM CHLORIDE 0.9 % IV SOLN: Freq: Once | INTRAVENOUS | Status: DC

## 2016-09-29 MED ORDER — SODIUM CHLORIDE 0.9 % IV SOLN
480.0000 mg/m2 | Freq: Once | INTRAVENOUS | Status: AC
  Administered 2016-09-29: 700 mg via INTRAVENOUS
  Filled 2016-09-29: qty 8

## 2016-09-29 MED ORDER — PROCHLORPERAZINE MALEATE 10 MG PO TABS
ORAL_TABLET | ORAL | Status: AC
  Filled 2016-09-29: qty 1

## 2016-09-29 MED ORDER — SODIUM CHLORIDE 0.9% FLUSH
10.0000 mL | INTRAVENOUS | Status: DC | PRN
  Administered 2016-09-29: 10 mL
  Filled 2016-09-29: qty 10

## 2016-09-29 MED ORDER — PROCHLORPERAZINE MALEATE 10 MG PO TABS
10.0000 mg | ORAL_TABLET | Freq: Once | ORAL | Status: AC
  Administered 2016-09-29: 10 mg via ORAL

## 2016-09-29 MED ORDER — HEPARIN SOD (PORK) LOCK FLUSH 100 UNIT/ML IV SOLN
500.0000 [IU] | Freq: Once | INTRAVENOUS | Status: AC | PRN
  Administered 2016-09-29: 500 [IU]
  Filled 2016-09-29: qty 5

## 2016-09-29 NOTE — Progress Notes (Signed)
Writer passed message along to patient about her next appointment/treatment being due 10/20/2017, pt stated that she only wanted to come in on Friday. Informed Dr Magrinat's nurse, and pt will need to change appointment date.

## 2016-09-30 ENCOUNTER — Telehealth: Payer: Self-pay | Admitting: Oncology

## 2016-09-30 LAB — CA 125

## 2016-09-30 NOTE — Telephone Encounter (Signed)
lvm to inform pt of r/s appts per LOS starting 11/1

## 2016-10-08 ENCOUNTER — Other Ambulatory Visit: Payer: Medicare Other

## 2016-10-08 ENCOUNTER — Ambulatory Visit: Payer: Medicare Other | Admitting: Oncology

## 2016-10-08 ENCOUNTER — Ambulatory Visit: Payer: Medicare Other

## 2016-10-14 ENCOUNTER — Telehealth: Payer: Self-pay | Admitting: Oncology

## 2016-10-14 NOTE — Telephone Encounter (Signed)
Returned call to patient regarding her 11/3 appointments. LVM

## 2016-10-20 ENCOUNTER — Ambulatory Visit: Payer: Medicare Other | Admitting: Oncology

## 2016-10-20 ENCOUNTER — Other Ambulatory Visit: Payer: Medicare Other

## 2016-10-20 ENCOUNTER — Ambulatory Visit: Payer: Medicare Other

## 2016-10-22 ENCOUNTER — Ambulatory Visit (HOSPITAL_BASED_OUTPATIENT_CLINIC_OR_DEPARTMENT_OTHER): Payer: Medicare Other

## 2016-10-22 ENCOUNTER — Other Ambulatory Visit (HOSPITAL_BASED_OUTPATIENT_CLINIC_OR_DEPARTMENT_OTHER): Payer: Medicare Other

## 2016-10-22 ENCOUNTER — Ambulatory Visit (HOSPITAL_BASED_OUTPATIENT_CLINIC_OR_DEPARTMENT_OTHER): Payer: Medicare Other | Admitting: Oncology

## 2016-10-22 VITALS — BP 156/69 | HR 87 | Temp 98.1°F | Resp 18 | Ht 59.0 in | Wt 117.2 lb

## 2016-10-22 DIAGNOSIS — Z5111 Encounter for antineoplastic chemotherapy: Secondary | ICD-10-CM | POA: Diagnosis not present

## 2016-10-22 DIAGNOSIS — C772 Secondary and unspecified malignant neoplasm of intra-abdominal lymph nodes: Secondary | ICD-10-CM

## 2016-10-22 DIAGNOSIS — C787 Secondary malignant neoplasm of liver and intrahepatic bile duct: Secondary | ICD-10-CM | POA: Diagnosis not present

## 2016-10-22 DIAGNOSIS — C569 Malignant neoplasm of unspecified ovary: Secondary | ICD-10-CM

## 2016-10-22 DIAGNOSIS — R970 Elevated carcinoembryonic antigen [CEA]: Secondary | ICD-10-CM | POA: Diagnosis not present

## 2016-10-22 LAB — COMPREHENSIVE METABOLIC PANEL
ALBUMIN: 3.4 g/dL — AB (ref 3.5–5.0)
ALK PHOS: 88 U/L (ref 40–150)
ALT: 14 U/L (ref 0–55)
ANION GAP: 10 meq/L (ref 3–11)
AST: 25 U/L (ref 5–34)
BILIRUBIN TOTAL: 0.51 mg/dL (ref 0.20–1.20)
BUN: 21.1 mg/dL (ref 7.0–26.0)
CO2: 22 meq/L (ref 22–29)
Calcium: 9.3 mg/dL (ref 8.4–10.4)
Chloride: 107 mEq/L (ref 98–109)
Creatinine: 0.9 mg/dL (ref 0.6–1.1)
EGFR: 63 mL/min/{1.73_m2} — AB (ref >90)
GLUCOSE: 119 mg/dL (ref 70–140)
POTASSIUM: 4.1 meq/L (ref 3.5–5.1)
SODIUM: 138 meq/L (ref 136–145)
Total Protein: 6.9 g/dL (ref 6.4–8.3)

## 2016-10-22 LAB — CBC WITH DIFFERENTIAL/PLATELET
BASO%: 0.2 % (ref 0.0–2.0)
BASOS ABS: 0 10*3/uL (ref 0.0–0.1)
EOS ABS: 0 10*3/uL (ref 0.0–0.5)
EOS%: 0 % (ref 0.0–7.0)
HCT: 30.9 % — ABNORMAL LOW (ref 34.8–46.6)
HGB: 10.2 g/dL — ABNORMAL LOW (ref 11.6–15.9)
LYMPH%: 11.6 % — AB (ref 14.0–49.7)
MCH: 31.8 pg (ref 25.1–34.0)
MCHC: 32.9 g/dL (ref 31.5–36.0)
MCV: 96.6 fL (ref 79.5–101.0)
MONO#: 0.1 10*3/uL (ref 0.1–0.9)
MONO%: 2.1 % (ref 0.0–14.0)
NEUT#: 5.6 10*3/uL (ref 1.5–6.5)
NEUT%: 86.1 % — ABNORMAL HIGH (ref 38.4–76.8)
PLATELETS: 151 10*3/uL (ref 145–400)
RBC: 3.2 10*6/uL — AB (ref 3.70–5.45)
RDW: 19.4 % — ABNORMAL HIGH (ref 11.2–14.5)
WBC: 6.6 10*3/uL (ref 3.9–10.3)
lymph#: 0.8 10*3/uL — ABNORMAL LOW (ref 0.9–3.3)

## 2016-10-22 MED ORDER — SODIUM CHLORIDE 0.9 % IV SOLN
480.0000 mg/m2 | Freq: Once | INTRAVENOUS | Status: AC
  Administered 2016-10-22: 700 mg via INTRAVENOUS
  Filled 2016-10-22: qty 20

## 2016-10-22 MED ORDER — CYANOCOBALAMIN 1000 MCG/ML IJ SOLN
1000.0000 ug | Freq: Once | INTRAMUSCULAR | Status: AC
  Administered 2016-10-22: 1000 ug via INTRAMUSCULAR

## 2016-10-22 MED ORDER — HEPARIN SOD (PORK) LOCK FLUSH 100 UNIT/ML IV SOLN
500.0000 [IU] | Freq: Once | INTRAVENOUS | Status: AC | PRN
  Administered 2016-10-22: 500 [IU]
  Filled 2016-10-22: qty 5

## 2016-10-22 MED ORDER — CYANOCOBALAMIN 1000 MCG/ML IJ SOLN
INTRAMUSCULAR | Status: AC
  Filled 2016-10-22: qty 1

## 2016-10-22 MED ORDER — PROCHLORPERAZINE MALEATE 10 MG PO TABS
10.0000 mg | ORAL_TABLET | Freq: Once | ORAL | Status: AC
  Administered 2016-10-22: 10 mg via ORAL

## 2016-10-22 MED ORDER — SODIUM CHLORIDE 0.9 % IV SOLN
Freq: Once | INTRAVENOUS | Status: AC
  Administered 2016-10-22: 14:00:00 via INTRAVENOUS

## 2016-10-22 MED ORDER — PROCHLORPERAZINE MALEATE 10 MG PO TABS
ORAL_TABLET | ORAL | Status: AC
  Filled 2016-10-22: qty 1

## 2016-10-22 MED ORDER — SODIUM CHLORIDE 0.9% FLUSH
10.0000 mL | INTRAVENOUS | Status: DC | PRN
  Administered 2016-10-22: 10 mL
  Filled 2016-10-22: qty 10

## 2016-10-22 NOTE — Patient Instructions (Signed)
Pemetrexed injection What is this medicine? PEMETREXED (PEM e TREX ed) is a chemotherapy drug. This medicine affects cells that are rapidly growing, such as cancer cells and cells in your mouth and stomach. It is usually used to treat lung cancers like non-small cell lung cancer and mesothelioma. It may also be used to treat other cancers. This medicine may be used for other purposes; ask your health care provider or pharmacist if you have questions. What should I tell my health care provider before I take this medicine? They need to know if you have any of these conditions: -if you frequently drink alcohol containing beverages -infection (especially a virus infection such as chickenpox, cold sores, or herpes) -kidney disease -liver disease -low blood counts, like low platelets, red bloods, or white blood cells -an unusual or allergic reaction to pemetrexed, mannitol, other medicines, foods, dyes, or preservatives -pregnant or trying to get pregnant -breast-feeding How should I use this medicine? This drug is given as an infusion into a vein. It is administered in a hospital or clinic by a specially trained health care professional. Talk to your pediatrician regarding the use of this medicine in children. Special care may be needed. Overdosage: If you think you have taken too much of this medicine contact a poison control center or emergency room at once. NOTE: This medicine is only for you. Do not share this medicine with others. What if I miss a dose? It is important not to miss your dose. Call your doctor or health care professional if you are unable to keep an appointment. What may interact with this medicine? -aspirin and aspirin-like medicines -medicines to increase blood counts like filgrastim, pegfilgrastim, sargramostim -methotrexate -NSAIDS, medicines for pain and inflammation, like ibuprofen or naproxen -probenecid -pyrimethamine -vaccines Talk to your doctor or health care  professional before taking any of these medicines: -acetaminophen -aspirin -ibuprofen -ketoprofen -naproxen This list may not describe all possible interactions. Give your health care provider a list of all the medicines, herbs, non-prescription drugs, or dietary supplements you use. Also tell them if you smoke, drink alcohol, or use illegal drugs. Some items may interact with your medicine. What should I watch for while using this medicine? Visit your doctor for checks on your progress. This drug may make you feel generally unwell. This is not uncommon, as chemotherapy can affect healthy cells as well as cancer cells. Report any side effects. Continue your course of treatment even though you feel ill unless your doctor tells you to stop. In some cases, you may be given additional medicines to help with side effects. Follow all directions for their use. Call your doctor or health care professional for advice if you get a fever, chills or sore throat, or other symptoms of a cold or flu. Do not treat yourself. This drug decreases your body's ability to fight infections. Try to avoid being around people who are sick. This medicine may increase your risk to bruise or bleed. Call your doctor or health care professional if you notice any unusual bleeding. Be careful brushing and flossing your teeth or using a toothpick because you may get an infection or bleed more easily. If you have any dental work done, tell your dentist you are receiving this medicine. Avoid taking products that contain aspirin, acetaminophen, ibuprofen, naproxen, or ketoprofen unless instructed by your doctor. These medicines may hide a fever. Call your doctor or health care professional if you get diarrhea or mouth sores. Do not treat yourself. To protect your   kidneys, drink water or other fluids as directed while you are taking this medicine. Men and women must use effective birth control while taking this medicine. You may also  need to continue using effective birth control for a time after stopping this medicine. Do not become pregnant while taking this medicine. Tell your doctor right away if you think that you or your partner might be pregnant. There is a potential for serious side effects to an unborn child. Talk to your health care professional or pharmacist for more information. Do not breast-feed an infant while taking this medicine. This medicine may lower sperm counts. What side effects may I notice from receiving this medicine? Side effects that you should report to your doctor or health care professional as soon as possible: -allergic reactions like skin rash, itching or hives, swelling of the face, lips, or tongue -low blood counts - this medicine may decrease the number of white blood cells, red blood cells and platelets. You may be at increased risk for infections and bleeding. -signs of infection - fever or chills, cough, sore throat, pain or difficulty passing urine -signs of decreased platelets or bleeding - bruising, pinpoint red spots on the skin, black, tarry stools, blood in the urine -signs of decreased red blood cells - unusually weak or tired, fainting spells, lightheadedness -breathing problems, like a dry cough -changes in emotions or moods -chest pain -confusion -diarrhea -high blood pressure -mouth or throat sores or ulcers -pain, swelling, warmth in the leg -pain on swallowing -swelling of the ankles, feet, hands -trouble passing urine or change in the amount of urine -vomiting -yellowing of the eyes or skin Side effects that usually do not require medical attention (report to your doctor or health care professional if they continue or are bothersome): -hair loss -loss of appetite -nausea -stomach upset This list may not describe all possible side effects. Call your doctor for medical advice about side effects. You may report side effects to FDA at 1-800-FDA-1088. Where should I keep  my medicine? This drug is given in a hospital or clinic and will not be stored at home. NOTE: This sheet is a summary. It may not cover all possible information. If you have questions about this medicine, talk to your doctor, pharmacist, or health care provider.    2016, Elsevier/[employer] Standard. (2008-07-09 13:24:03)  

## 2016-10-22 NOTE — Progress Notes (Signed)
ID: Cassandra Nichols   DOB: 01-30-1941  MR#: [id number]  [id number]   PCP:  Marjorie Smolder, MD GYN:  [name], MD OTHER:  [name], MD  CHIEF COMPLAINT:  Recurrent Ovarian Cancer  CURRENT TREATMENT:  [Abraxane]  NOTE: THIS PATIENT REQUESTS THAT ABSOLUTELY NO NUMERIC INFORMATION BE GIVEN REGARDING ANY LAB AND PARTICULARLY DOES NOT WANT TO KNOW SPECIFIC CA-125 RESULTS  INTERVAL HISTORY: [name] returns today for follow up of her ovarian cancer. Today is day 1 cycle 3 of  pemetrexed, which she receives every 21 days. Cycle 2 was delayed approximately 2 weeks because of personal issues  REVIEW OF SYSTEMS: Summer Roccia feels generally well. She is working as a Mining engineer. She is having some sinus symptoms and taking Claritin for this. She has not had significant shortness of breath, cough or pleurisy or any fever associated with this. She has stress urinary incontinence which is not a new problem. There has been no dysuria or hematuria. She is tolerating the pemetrexed with no side effects that she is aware of. She is also on steroid premeds which she tolerates well. A detailed review of systems was otherwise stable.   HISTORY OF PRESENT ILLNESS: From the earlier summary note:   Cassandra Nichols was initially diagnosed with ovarian cancer in July of 2002. She underwent debulking surgery at that time for what proved to be a 4 of 18 lymph nodes positive disease. She was initially treated with 6 cycles of paclitaxel and carboplatin adjuvantly, completed in December of 2002.  Patient has had multiple recurrences since December of 2002, and has been treated with multiple agents, as detailed below.   PAST MEDICAL HISTORY: Past Medical History:  Diagnosis Date  . Cancer ([facility] [facility])   . 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 Ovarian cancer ([place]) 11/17/2011  . Shingles     PAST SURGICAL HISTORY: Past Surgical History:  Procedure Laterality Date  . GANGLION CYST EXCISION Left    Wrist     FAMILY HISTORY Family History  Problem Relation Age of Onset  . Hypertension Mother   . GER disease Mother   . Cancer Father     Cancer (stomach)   the patient's father died at the age of 63, from cancer that may have it started in his abdomen. He had 8 siblings including 2 sisters with breast cancer. The patient's mother had by Dr. cancer, but died from unrelated causes at the age of 20. The patient's maternal grandmother may have had ovarian cancer. The patient is an only child   GYNECOLOGIC HISTORY: Menarche age 47, first live birth age 46. The patient is GX P1. She went through the change of life around age 64. She subsequently had a TAH/BSO was part of her ovarian cancer treatment.  SOCIAL HISTORY: (Updated 03/08/2014) Cassandra Nichols lives alone. She works temporary jobs, primarily Nutritional therapist.  Her (adopted) daughter [name] lives [place] and is Mudlogger of the epidemiology and [employer] there; she is married with 2 children. Son  [name] lives in [place], [place]. He has 4 children of his own. He is Tree surgeon for a [employer]. His wife is a Marine scientist   ADVANCED DIRECTIVES: At the 01/16/2016 visit [name] tells me that her son [name] is her healthcare power of attorney. He can be reached at [phone]. [Her daughter [name] is her financial power of attorney.]  HEALTH MAINTENANCE:  (Updated  03/08/2014) Social History  Substance Use Topics  . Smoking status:  Former Smoker  . Smokeless tobacco: Never Used  . Alcohol use No     Comment: Occasionally     Colonoscopy:   PAP: s/p hysterectomy BSO  Bone density:   Mammogram:  09/16/2015   Allergies  Allergen Reactions  . Penicillins Rash    Current Outpatient Prescriptions  Medication Sig Dispense Refill  . dexamethasone (DECADRON) 4 MG tablet Take 2 times a day with food the day before chemo, the day of chemo, and the day after chemo 30 tablet 4  . docusate sodium (COLACE) 100 MG capsule Take 100 mg by mouth 2  (two) times daily.    . fluticasone (FLONASE) 50 MCG/ACT nasal spray Place 1 spray into both nostrils as needed for allergies or rhinitis.    . folic acid (FOLVITE) 1 MG tablet Take 1 tablet (1 mg total) by mouth daily. 90 tablet 4  . hydrochlorothiazide (MICROZIDE) 12.5 MG capsule Take 1 capsule (12.5 mg total) by mouth daily. 30 capsule 0  . lisinopril (PRINIVIL,ZESTRIL) 20 MG tablet Take 1 tablet (20 mg total) by mouth daily. 90 tablet 0  . loratadine (CLARITIN) 10 MG tablet Take 10 mg by mouth daily as needed for allergies.    . Melatonin 3 MG TABS Take 3 mg by mouth at bedtime as needed. Reported on 03/12/2016    . prochlorperazine (COMPAZINE) 10 MG tablet Take 1 tablet (10 mg total) by mouth every 6 (six) hours as needed for nausea or vomiting. 30 tablet 6  . vitamin B-12 (CYANOCOBALAMIN) 1000 MCG tablet Take 1 tablet (1,000 mcg total) by mouth daily. 90 tablet 4   No current facility-administered medications for this visit.     OBJECTIVE: Middle-aged white woman Who appears stated age 74:   10/22/16 1123  BP: (!) 156/69  Pulse: 87  Resp: 18  Temp: 98.1 F (36.7 C)     Body mass index is 23.67 kg/m.    ECOG FS: 1  Sclerae unicteric, pupils round and equal Oropharynx clear and moist-- no thrush or other lesions No cervical or supraclavicular adenopathy Lungs no rales or rhonchi Heart regular rate and rhythm Abd soft, nondistended, nontender, positive bowel sounds, no masses palpated MSK no focal spinal tenderness Neuro: nonfocal, well oriented, appropriate affect Breasts: Deferred    LAB RESULTS:   Lab Results  Component Value Date   WBC 6.6 10/22/2016   NEUTROABS 5.6 10/22/2016   HGB 10.2 (L) 10/22/2016   HCT 30.9 (L) 10/22/2016   MCV 96.6 10/22/2016   PLT 151 10/22/2016        Chemistry      Component Value Date/Time   NA 138 10/22/2016 1106   K 4.1 10/22/2016 1106   CL 106 09/29/2016 1343   CL 106 05/09/2013 1304   CO2 22 10/22/2016 1106   BUN  21.1 10/22/2016 1106   CREATININE 0.9 10/22/2016 1106      Component Value Date/Time   CALCIUM 9.3 10/22/2016 1106   ALKPHOS 88 10/22/2016 1106   AST 25 10/22/2016 1106   ALT 14 10/22/2016 1106   BILITOT 0.51 10/22/2016 1106     Results for KIELYNN, GOIN (MRN [id number]) as of 10/22/2016 07:52  Ref. Range 03/12/2016 14:25 04/09/2016 14:55 05/14/2016 14:12 08/26/2016 14:38 09/29/2016 13:33  Cancer Antigen (CA) 125 Latest Ref Range: 0.0 - 38.1 U/mL 1,201.0 (H) 740.9 (H) 552.2 (H) 1,333.0 (H) 4,383.0 (H)    STUDIES: No results found.   ASSESSMENT: 75 y.o.  [place] woman (1)  with a history  of ovarian cancer dating back to July 2002 when she underwent debulking surgery for what proved to be 4/18 lymph node positive disease.   (2)  She received 6 cycles of paclitaxel/carboplatin adjuvantly completed in December 2002.   (3)  She had her first recurrence in December 2005 treated with paclitaxel;   (4)  second recurrence May 2007 treated with [place] and paclitaxel;   (5)  third recurrence January 2010 treated with topotecan and Gemzar;   (6)  fourth recurrence January 2012 treated with single-agent carboplatin (January to August 2012) with initial response, then plateau; with progression January 2013 (less than 6 months after last carbo dose) treated with single-agent gemzar given Q2 weeks (January to April 2013), with evidence of progression  (7) Doxil strartedJuly 2013 6, with evidence of initial response, and fair tolerance. Held between May and August 2014 due to patient's work schedule and scheduling issues.  Resumed treatment as of 08/24/2013, with some delays secondary to the patient's schedule. As of early December 2014, her total cumulative dose of Doxil reached 560 mg per meter square. In addition there was also an upward trend of the CA 125. Doxil was discontinued (last dose 10/30/2013). Echocardiogram on 11/14/2013 showed a well preserved ejection fraction of 55-60%.  (8)  treated  with single agent paclitaxel, given on days one and 8 of each 21 day cycle, first dose on 12/27/2013,with continuing response, but mild to moderate toxicity; 11th and last dose 04/12/2014   (9) carboplatin and cyclophosphamide started on 08/28/14 given every 21 days-- stopped 10/10/2014 after a reaction to carboplatin (also no evidence of response)  (10) cyclophosphamide and bevacizumab started 12/02/2014, stopped 12/26/2015 with progression  (a) CT scan of the abdomen and pelvis 06/19/2015 shows stable disease  (b) CT of the chest/abd/pelvis 09/12/2015 stable  (c) PET scan 01/15/2016 shows progression in the liver and upper abdominal nodes  (11) abraxane started 01/16/2016,  repeated every 14 days with evidence of response  (a) last dose 06/25/2016, discontinued because of neuropathy  (12) offered genetics testing at the 01/16/2016 visit, but declined  (13) started pemetrexed 08/27/2016  (a) cycle 2 delayed 2 weeks because of the patient's travel schedule   PLAN:  Mizuki Miles is tolerating the pemetrexed well. She is very careful to take her B12 and folate supplementation. She is not having any problems from her steroid premeds that she is aware of.  I'm concerned about the rise in her CA-125 I suggested we obtain a restaging CT scan at this point or PET scan. However her financial situation remains precarious. She tells me she has already been turned over to a collecting agency and she simply cannot afford any other tests at this point.  Accordingly we are going to continue treatments every 3 weeks and follow clinically. I am hoping to be able to get 3 more doses of pemetrexed and. If there has been no turn around on her CA-125 or if any new symptoms develop, we will consider switching back to taxanes, despite the fact that she still has some neuropathy involving her feet.  I feel somewhat handicapped at not being able to discuss this openly with Cassandra Nichols except in very general terms since she  feels it would be devastating for her to know "numbers", whether good or bad.  I'm going to see her with her next treatment anticipate close follow-up because of the concern regarding disease progression. She knows to call for any new symptoms that may develop before that visit.  Chauncey Cruel, MD    10/23/2016

## 2016-10-23 LAB — CA 125: Cancer Antigen (CA) 125: 6822 U/mL — ABNORMAL HIGH (ref 0.0–38.1)

## 2016-10-29 ENCOUNTER — Ambulatory Visit: Payer: Medicare Other

## 2016-10-29 ENCOUNTER — Ambulatory Visit: Payer: Medicare Other | Admitting: Oncology

## 2016-10-29 ENCOUNTER — Other Ambulatory Visit: Payer: Medicare Other

## 2016-11-10 ENCOUNTER — Other Ambulatory Visit: Payer: Medicare Other

## 2016-11-10 ENCOUNTER — Ambulatory Visit: Payer: Medicare Other

## 2016-11-12 ENCOUNTER — Other Ambulatory Visit (HOSPITAL_BASED_OUTPATIENT_CLINIC_OR_DEPARTMENT_OTHER): Payer: Medicare Other

## 2016-11-12 ENCOUNTER — Other Ambulatory Visit: Payer: Self-pay | Admitting: *Deleted

## 2016-11-12 ENCOUNTER — Ambulatory Visit (HOSPITAL_BASED_OUTPATIENT_CLINIC_OR_DEPARTMENT_OTHER): Payer: Medicare Other

## 2016-11-12 DIAGNOSIS — C569 Malignant neoplasm of unspecified ovary: Secondary | ICD-10-CM

## 2016-11-12 DIAGNOSIS — C787 Secondary malignant neoplasm of liver and intrahepatic bile duct: Secondary | ICD-10-CM

## 2016-11-12 DIAGNOSIS — C772 Secondary and unspecified malignant neoplasm of intra-abdominal lymph nodes: Secondary | ICD-10-CM

## 2016-11-12 DIAGNOSIS — Z5111 Encounter for antineoplastic chemotherapy: Secondary | ICD-10-CM | POA: Diagnosis not present

## 2016-11-12 LAB — CBC & DIFF AND RETIC
BASO%: 0 % (ref 0.0–2.0)
Basophils Absolute: 0 10*3/uL (ref 0.0–0.1)
EOS%: 0 % (ref 0.0–7.0)
Eosinophils Absolute: 0 10*3/uL (ref 0.0–0.5)
HEMATOCRIT: 28.5 % — AB (ref 34.8–46.6)
HEMOGLOBIN: 9.3 g/dL — AB (ref 11.6–15.9)
IMMATURE RETIC FRACT: 15.7 % — AB (ref 1.60–10.00)
LYMPH%: 12.7 % — AB (ref 14.0–49.7)
MCH: 32 pg (ref 25.1–34.0)
MCHC: 32.6 g/dL (ref 31.5–36.0)
MCV: 97.9 fL (ref 79.5–101.0)
MONO#: 0.5 10*3/uL (ref 0.1–0.9)
MONO%: 6.7 % (ref 0.0–14.0)
NEUT#: 6 10*3/uL (ref 1.5–6.5)
NEUT%: 80.6 % — AB (ref 38.4–76.8)
PLATELETS: 152 10*3/uL (ref 145–400)
RBC: 2.91 10*6/uL — ABNORMAL LOW (ref 3.70–5.45)
RDW: 19 % — ABNORMAL HIGH (ref 11.2–14.5)
Retic %: 3.49 % — ABNORMAL HIGH (ref 0.70–2.10)
Retic Ct Abs: 101.56 10*3/uL — ABNORMAL HIGH (ref 33.70–90.70)
WBC: 7.4 10*3/uL (ref 3.9–10.3)
lymph#: 0.9 10*3/uL (ref 0.9–3.3)

## 2016-11-12 LAB — COMPREHENSIVE METABOLIC PANEL
ALBUMIN: 3.5 g/dL (ref 3.5–5.0)
ALK PHOS: 86 U/L (ref 40–150)
ALT: 11 U/L (ref 0–55)
ANION GAP: 11 meq/L (ref 3–11)
AST: 25 U/L (ref 5–34)
BUN: 21.3 mg/dL (ref 7.0–26.0)
CALCIUM: 9.4 mg/dL (ref 8.4–10.4)
CHLORIDE: 106 meq/L (ref 98–109)
CO2: 21 mEq/L — ABNORMAL LOW (ref 22–29)
Creatinine: 0.9 mg/dL (ref 0.6–1.1)
EGFR: 65 mL/min/{1.73_m2} — AB (ref >90)
Glucose: 119 mg/dl (ref 70–140)
POTASSIUM: 3.7 meq/L (ref 3.5–5.1)
Sodium: 138 mEq/L (ref 136–145)
Total Bilirubin: 0.47 mg/dL (ref 0.20–1.20)
Total Protein: 6.9 g/dL (ref 6.4–8.3)

## 2016-11-12 MED ORDER — SODIUM CHLORIDE 0.9 % IV SOLN
Freq: Once | INTRAVENOUS | Status: AC
  Administered 2016-11-12: 16:00:00 via INTRAVENOUS

## 2016-11-12 MED ORDER — PROCHLORPERAZINE MALEATE 10 MG PO TABS
10.0000 mg | ORAL_TABLET | Freq: Once | ORAL | Status: AC
  Administered 2016-11-12: 10 mg via ORAL

## 2016-11-12 MED ORDER — HEPARIN SOD (PORK) LOCK FLUSH 100 UNIT/ML IV SOLN
500.0000 [IU] | Freq: Once | INTRAVENOUS | Status: AC | PRN
  Administered 2016-11-12: 500 [IU]
  Filled 2016-11-12: qty 5

## 2016-11-12 MED ORDER — SODIUM CHLORIDE 0.9% FLUSH
10.0000 mL | INTRAVENOUS | Status: DC | PRN
  Administered 2016-11-12: 10 mL
  Filled 2016-11-12: qty 10

## 2016-11-12 MED ORDER — PROCHLORPERAZINE MALEATE 10 MG PO TABS
ORAL_TABLET | ORAL | Status: AC
  Filled 2016-11-12: qty 1

## 2016-11-12 MED ORDER — SODIUM CHLORIDE 0.9 % IV SOLN
480.0000 mg/m2 | Freq: Once | INTRAVENOUS | Status: AC
  Administered 2016-11-12: 700 mg via INTRAVENOUS
  Filled 2016-11-12: qty 20

## 2016-11-12 NOTE — Patient Instructions (Signed)
[facility] Discharge Instructions for Patients Receiving Chemotherapy  Today you received the following chemotherapy agents; Alimta.   To help prevent nausea and vomiting after your treatment, we encourage you to take your nausea medication as directed.    If you develop nausea and vomiting that is not controlled by your nausea medication, call the clinic.   BELOW ARE SYMPTOMS THAT SHOULD BE REPORTED IMMEDIATELY:  *FEVER GREATER THAN 100.5 F  *CHILLS WITH OR WITHOUT FEVER  NAUSEA AND VOMITING THAT IS NOT CONTROLLED WITH YOUR NAUSEA MEDICATION  *UNUSUAL SHORTNESS OF BREATH  *UNUSUAL BRUISING OR BLEEDING  TENDERNESS IN MOUTH AND THROAT WITH OR WITHOUT PRESENCE OF ULCERS  *URINARY PROBLEMS  *BOWEL PROBLEMS  UNUSUAL RASH Items with * indicate a potential emergency and should be followed up as soon as possible.  Feel free to call the clinic you have any questions or concerns. The clinic phone number is [phone].  Please show the CHEMO ALERT CARD at check-in to the Emergency Department and triage nurse.   

## 2016-11-19 ENCOUNTER — Other Ambulatory Visit: Payer: Medicare Other

## 2016-11-19 ENCOUNTER — Ambulatory Visit: Payer: Medicare Other

## 2016-12-03 ENCOUNTER — Ambulatory Visit (HOSPITAL_BASED_OUTPATIENT_CLINIC_OR_DEPARTMENT_OTHER): Payer: Medicare Other | Admitting: Oncology

## 2016-12-03 ENCOUNTER — Other Ambulatory Visit (HOSPITAL_BASED_OUTPATIENT_CLINIC_OR_DEPARTMENT_OTHER): Payer: Medicare Other

## 2016-12-03 ENCOUNTER — Ambulatory Visit (HOSPITAL_BASED_OUTPATIENT_CLINIC_OR_DEPARTMENT_OTHER): Payer: Medicare Other

## 2016-12-03 VITALS — BP 145/85 | HR 89 | Temp 97.6°F | Resp 16 | Ht 59.0 in | Wt 118.2 lb

## 2016-12-03 DIAGNOSIS — C772 Secondary and unspecified malignant neoplasm of intra-abdominal lymph nodes: Secondary | ICD-10-CM | POA: Diagnosis not present

## 2016-12-03 DIAGNOSIS — C787 Secondary malignant neoplasm of liver and intrahepatic bile duct: Secondary | ICD-10-CM

## 2016-12-03 DIAGNOSIS — G629 Polyneuropathy, unspecified: Secondary | ICD-10-CM | POA: Diagnosis not present

## 2016-12-03 DIAGNOSIS — C569 Malignant neoplasm of unspecified ovary: Secondary | ICD-10-CM

## 2016-12-03 DIAGNOSIS — Z5111 Encounter for antineoplastic chemotherapy: Secondary | ICD-10-CM

## 2016-12-03 LAB — CBC WITH DIFFERENTIAL/PLATELET
BASO%: 0.3 % (ref 0.0–2.0)
Basophils Absolute: 0 10*3/uL (ref 0.0–0.1)
EOS%: 0 % (ref 0.0–7.0)
Eosinophils Absolute: 0 10*3/uL (ref 0.0–0.5)
HCT: 30.5 % — ABNORMAL LOW (ref 34.8–46.6)
HGB: 9.8 g/dL — ABNORMAL LOW (ref 11.6–15.9)
LYMPH%: 10.1 % — AB (ref 14.0–49.7)
MCH: 32.5 pg (ref 25.1–34.0)
MCHC: 32.3 g/dL (ref 31.5–36.0)
MCV: 100.9 fL (ref 79.5–101.0)
MONO#: 0.2 10*3/uL (ref 0.1–0.9)
MONO%: 3.7 % (ref 0.0–14.0)
NEUT#: 4 10*3/uL (ref 1.5–6.5)
NEUT%: 85.9 % — AB (ref 38.4–76.8)
Platelets: 148 10*3/uL (ref 145–400)
RBC: 3.02 10*6/uL — AB (ref 3.70–5.45)
RDW: 19.5 % — ABNORMAL HIGH (ref 11.2–14.5)
WBC: 4.6 10*3/uL (ref 3.9–10.3)
lymph#: 0.5 10*3/uL — ABNORMAL LOW (ref 0.9–3.3)

## 2016-12-03 LAB — COMPREHENSIVE METABOLIC PANEL
ALT: 16 U/L (ref 0–55)
AST: 34 U/L (ref 5–34)
Albumin: 3.4 g/dL — ABNORMAL LOW (ref 3.5–5.0)
Alkaline Phosphatase: 93 U/L (ref 40–150)
Anion Gap: 11 mEq/L (ref 3–11)
BUN: 19.3 mg/dL (ref 7.0–26.0)
CHLORIDE: 106 meq/L (ref 98–109)
CO2: 22 mEq/L (ref 22–29)
CREATININE: 1 mg/dL (ref 0.6–1.1)
Calcium: 9.7 mg/dL (ref 8.4–10.4)
EGFR: 58 mL/min/{1.73_m2} — ABNORMAL LOW (ref >90)
Glucose: 146 mg/dl — ABNORMAL HIGH (ref 70–140)
Potassium: 4.2 mEq/L (ref 3.5–5.1)
Sodium: 138 mEq/L (ref 136–145)
Total Bilirubin: 0.58 mg/dL (ref 0.20–1.20)
Total Protein: 7.1 g/dL (ref 6.4–8.3)

## 2016-12-03 MED ORDER — SODIUM CHLORIDE 0.9% FLUSH
10.0000 mL | INTRAVENOUS | Status: DC | PRN
  Administered 2016-12-03: 10 mL
  Filled 2016-12-03: qty 10

## 2016-12-03 MED ORDER — SODIUM CHLORIDE 0.9 % IV SOLN
480.0000 mg/m2 | Freq: Once | INTRAVENOUS | Status: AC
  Administered 2016-12-03: 700 mg via INTRAVENOUS
  Filled 2016-12-03: qty 20

## 2016-12-03 MED ORDER — SODIUM CHLORIDE 0.9 % IV SOLN
Freq: Once | INTRAVENOUS | Status: AC
  Administered 2016-12-03: 12:00:00 via INTRAVENOUS

## 2016-12-03 MED ORDER — PROCHLORPERAZINE MALEATE 10 MG PO TABS
10.0000 mg | ORAL_TABLET | Freq: Once | ORAL | Status: AC
Start: 2016-12-03 — End: 2016-12-03
  Administered 2016-12-03: 10 mg via ORAL

## 2016-12-03 MED ORDER — PROCHLORPERAZINE MALEATE 10 MG PO TABS
ORAL_TABLET | ORAL | Status: AC
Start: 2016-12-03 — End: 2016-12-03
  Filled 2016-12-03: qty 1

## 2016-12-03 MED ORDER — HEPARIN SOD (PORK) LOCK FLUSH 100 UNIT/ML IV SOLN
500.0000 [IU] | Freq: Once | INTRAVENOUS | Status: AC | PRN
  Administered 2016-12-03: 500 [IU]
  Filled 2016-12-03: qty 5

## 2016-12-03 NOTE — Patient Instructions (Signed)
[facility] Discharge Instructions for Patients Receiving Chemotherapy  Today you received the following chemotherapy agents; Alimta.   To help prevent nausea and vomiting after your treatment, we encourage you to take your nausea medication as directed.    If you develop nausea and vomiting that is not controlled by your nausea medication, call the clinic.   BELOW ARE SYMPTOMS THAT SHOULD BE REPORTED IMMEDIATELY:  *FEVER GREATER THAN 100.5 F  *CHILLS WITH OR WITHOUT FEVER  NAUSEA AND VOMITING THAT IS NOT CONTROLLED WITH YOUR NAUSEA MEDICATION  *UNUSUAL SHORTNESS OF BREATH  *UNUSUAL BRUISING OR BLEEDING  TENDERNESS IN MOUTH AND THROAT WITH OR WITHOUT PRESENCE OF ULCERS  *URINARY PROBLEMS  *BOWEL PROBLEMS  UNUSUAL RASH Items with * indicate a potential emergency and should be followed up as soon as possible.  Feel free to call the clinic you have any questions or concerns. The clinic phone number is [phone].  Please show the CHEMO ALERT CARD at check-in to the Emergency Department and triage nurse.   

## 2016-12-03 NOTE — Progress Notes (Signed)
ID: [name]   DOB: September 25, 1941  MR#: [id number]  [id number]   PCP:  Marjorie Smolder, MD GYN:  [name], MD OTHER:  [name], MD  CHIEF COMPLAINT:  Recurrent Ovarian Cancer  CURRENT TREATMENT:  [Abraxane]  NOTE: THIS PATIENT REQUESTS THAT ABSOLUTELY NO NUMERIC INFORMATION BE GIVEN REGARDING ANY LAB AND PARTICULARLY DOES NOT WANT TO KNOW SPECIFIC CA-125 RESULTS  INTERVAL HISTORY: Destyn Waddy returns today for follow up of her ovarian cancer. Today is day 1 cycle 5 of  pemetrexed, which she receives every 21 days. Cycle 2 was delayed approximately 2 weeks because of personal issues. She has otherwise tolerated it well, and has been very compliant with her vitamin supplementation regimen.  REVIEW OF SYSTEMS: Marlissa Arkwright is doing "fine". She spent an early Christmas with her son in [place] and is going to be in [place] between 12/05/2016 and 12/18/2016. She will be visiting her daughter there. She has been having a bit of a nasal drip which is clear not associated with cough for fever. She has been using loratadine and Flonase and currently is using bedtime Benadryl. She has gained a little bit of weight. Bowel movements are normal. She has urinary frequency but no dysuria. This is not a new symptom. 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 The feet feel to her like there is an extra layer of skin. She actually has sensation, not numbness or pain. She thinks that at times this may make her a little imbalance. A detailed review of systems today was otherwise stable   HISTORY OF PRESENT ILLNESS: From the earlier summary note:   Areya Haddow was initially diagnosed with ovarian cancer in July of 2002. She underwent debulking surgery at that time for what proved to be a 4 of 18 lymph nodes positive disease. She was initially treated with 6 cycles of paclitaxel and carboplatin adjuvantly, completed in December of 2002.  Patient has had multiple recurrences since December of  2002, and has been treated with multiple agents, as detailed below.   PAST MEDICAL HISTORY: Past Medical History:  Diagnosis Date  . Cancer ([facility])   . 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 Ovarian cancer ([facility]) 11/17/2011  . Shingles     PAST SURGICAL HISTORY: Past Surgical History:  Procedure Laterality Date  . GANGLION CYST EXCISION Left    Wrist    FAMILY HISTORY Family History  Problem Relation Age of Onset  . Hypertension Mother   . GER disease Mother   . Cancer Father     Cancer (stomach)   the patient's father died at the age of 54, from cancer that may have it started in his abdomen. He had 8 siblings including 2 sisters with breast cancer. The patient's mother had by Dr. cancer, but died from unrelated causes at the age of 88. The patient's maternal grandmother may have had ovarian cancer. The patient is an only child   GYNECOLOGIC HISTORY: Menarche age 14, first live birth age 65. The patient is GX P1. She went through the change of life around age 70. She subsequently had a TAH/BSO was part of her ovarian cancer treatment.  SOCIAL HISTORY: (Updated 03/08/2014) [name] lives alone. She works temporary jobs, primarily Nutritional therapist.  Her (adopted) daughter [name] lives [place] and is Mudlogger of the epidemiology and [place] there; she is married with 2 children. Son  [name] lives in [place], [place]. He has 4 children of his own.  He is Tree surgeon for a [employer]. His wife is a Marine scientist   ADVANCED DIRECTIVES: At the 01/16/2016 visit [name] tells me that her son [name] is her healthcare power of attorney. He can be reached at [phone]. [Her daughter [name] is her financial power of attorney.]  HEALTH MAINTENANCE:  (Updated  03/08/2014) Social History  Substance Use Topics  . Smoking status: Former Research scientist (life sciences)  . Smokeless tobacco: Never Used  . Alcohol use No     Comment: Occasionally     Colonoscopy:   PAP: s/p hysterectomy BSO  Bone density:    Mammogram:  09/16/2015   Allergies  Allergen Reactions  . Penicillins Rash    Current Outpatient Prescriptions  Medication Sig Dispense Refill  . dexamethasone (DECADRON) 4 MG tablet Take 2 times a day with food the day before chemo, the day of chemo, and the day after chemo 30 tablet 4  . docusate sodium (COLACE) 100 MG capsule Take 100 mg by mouth 2 (two) times daily.    . fluticasone (FLONASE) 50 MCG/ACT nasal spray Place 1 spray into both nostrils as needed for allergies or rhinitis.    . folic acid (FOLVITE) 1 MG tablet Take 1 tablet (1 mg total) by mouth daily. 90 tablet 4  . hydrochlorothiazide (MICROZIDE) 12.5 MG capsule Take 1 capsule (12.5 mg total) by mouth daily. 30 capsule 0  . lisinopril (PRINIVIL,ZESTRIL) 20 MG tablet Take 1 tablet (20 mg total) by mouth daily. 90 tablet 0  . loratadine (CLARITIN) 10 MG tablet Take 10 mg by mouth daily as needed for allergies.    . Melatonin 3 MG TABS Take 3 mg by mouth at bedtime as needed. Reported on 03/12/2016    . prochlorperazine (COMPAZINE) 10 MG tablet Take 1 tablet (10 mg total) by mouth every 6 (six) hours as needed for nausea or vomiting. 30 tablet 6  . vitamin B-12 (CYANOCOBALAMIN) 1000 MCG tablet Take 1 tablet (1,000 mcg total) by mouth daily. 90 tablet 4   No current facility-administered medications for this visit.     OBJECTIVE: Middle-aged white woman In no acute distress Vitals:   12/03/16 1107  BP: (!) 145/85  Pulse: 89  Resp: 16  Temp: 97.6 F (36.4 C)     Body mass index is 23.87 kg/m.    ECOG FS: 1 Filed Weights   12/03/16 1107  Weight: 118 lb 3.2 oz (53.6 kg)    Sclerae unicteric, EOMs intact Oropharynx clear and moist No cervical or supraclavicular adenopathy Lungs no rales or rhonchi Heart regular rate and rhythm Abd soft, nontender, not distended, positive bowel sounds MSK no focal spinal tenderness, no upper extremity lymphedema Neuro: nonfocal, well oriented, appropriate affect Breasts:  Deferred    LAB RESULTS:   Lab Results  Component Value Date   WBC 4.6 12/03/2016   NEUTROABS 4.0 12/03/2016   HGB 9.8 (L) 12/03/2016   HCT 30.5 (L) 12/03/2016   MCV 100.9 12/03/2016   PLT 148 12/03/2016        Chemistry      Component Value Date/Time   NA 138 12/03/2016 1023   K 4.2 12/03/2016 1023   CL 106 09/29/2016 1343   CL 106 05/09/2013 1304   CO2 22 12/03/2016 1023   BUN 19.3 12/03/2016 1023   CREATININE 1.0 12/03/2016 1023      Component Value Date/Time   CALCIUM 9.7 12/03/2016 1023   ALKPHOS 93 12/03/2016 1023   AST 34 12/03/2016 1023   ALT  16 12/03/2016 1023   BILITOT 0.58 12/03/2016 1023     Results for NECIE, GRIDLEY (MRN [id number]) as of 12/04/2016 16:49  Ref. Range 05/14/2016 14:12 08/26/2016 14:38 09/29/2016 13:33 10/22/2016 11:06 12/03/2016 10:23  Cancer Antigen (CA) 125 Latest Ref Range: 0.0 - 38.1 U/mL 552.2 (H) 1,333.0 (H) 4,383.0 (H) 6,822.0 (H) 10,661.0 (H)    STUDIES: No results found.   ASSESSMENT: 75 y.o.  [place] woman (1)  with a history of ovarian cancer dating back to July 2002 when she underwent debulking surgery for what proved to be 4/18 lymph node positive disease.   (2)  She received 6 cycles of paclitaxel/carboplatin adjuvantly completed in December 2002.   (3)  She had her first recurrence in December 2005 treated with paclitaxel;   (4)  second recurrence May 2007 treated with [place] and paclitaxel;   (5)  third recurrence January 2010 treated with topotecan and Gemzar;   (6)  fourth recurrence January 2012 treated with single-agent carboplatin (January to August 2012) with initial response, then plateau; with progression January 2013 (less than 6 months after last carbo dose) treated with single-agent gemzar given Q2 weeks (January to April 2013), with evidence of progression  (7) Doxil strartedJuly 2013 6, with evidence of initial response, and fair tolerance. Held between May and August 2014 due to patient's work schedule  and scheduling issues.  Resumed treatment as of 08/24/2013, with some delays secondary to the patient's schedule. As of early December 2014, her total cumulative dose of Doxil reached 560 mg per meter square. In addition there was also an upward trend of the CA 125. Doxil was discontinued (last dose 10/30/2013). Echocardiogram on 11/14/2013 showed a well preserved ejection fraction of 55-60%.  (8)  treated with single agent paclitaxel, given on days one and 8 of each 21 day cycle, first dose on 12/27/2013,with continuing response, but mild to moderate toxicity; 11th and last dose 04/12/2014   (9) carboplatin and cyclophosphamide started on 08/28/14 given every 21 days-- stopped 10/10/2014 after a reaction to carboplatin (also no evidence of response)  (10) cyclophosphamide and bevacizumab started 12/02/2014, stopped 12/26/2015 with progression  (a) CT scan of the abdomen and pelvis 06/19/2015 shows stable disease  (b) CT of the chest/abd/pelvis 09/12/2015 stable  (c) PET scan 01/15/2016 shows progression in the liver and upper abdominal nodes  (11) abraxane started 01/16/2016,  repeated every 14 days with evidence of response  (a) last dose 06/25/2016, discontinued because of neuropathy  (12) offered genetics testing at the 01/16/2016 visit, but declined  (13) started pemetrexed 08/27/2016  (a) cycle 2 delayed 2 weeks because of the patient's travel schedule   (b) stopped after cycle 5, given 12/03/2016, because of a continuing rise in the CA-125  (14) to start topotecan given days 1 and 8 of each 21 day cycle, with bevacizumab given stay 1 of each cycle 12/24/2016    PLAN:  Merianne Almaraz is tolerating the pemetrexed well and hopes to continue on it. However I am concerned about the rise in her CEA 125. The results today are pending. Normally we would obtain scans at this point to restage, but for financial and insurance reasons she does not want to do that. Accordingly we are relying on the tumor  marker and if that shows a further rise, we will change her methotrexate to a different agent.  What that agent should be is the question. She is very heavily pretreated. She did extremely well with the Abraxane which she tolerated  with no side effects other than neuropathy. The problem is that the neuropathy does persist, involving her feet. It could be a problem for her if it becomes any worse.  She received topotecan and Gemzar for her third recurrence in 2010. That has not been repeated. It may be reasonable to return to that at this point.  Normally I would see her with the next cycle to discuss this but the co-pays with visits are very difficult for her. We left it that I would see her briefly in the treatment area to discuss any change of treatment that might be necessary at that time.  Chauncey Cruel, MD    12/04/2016    Addendum: The CA 125 continues to rise. I am changing her next chemotherapy to topotecan plus bevacizumab. I will evaluate the patient in the treatment area to discuss with her whether she is willing to do day 8 and day 15 as well or day 8 only, as it is more likely given her schedule. She will receive bevacizumab every 21 days.

## 2016-12-04 LAB — CA 125

## 2016-12-04 NOTE — Progress Notes (Signed)
START ON PATHWAY REGIMEN - Ovarian  [id number]: Topotecan 4 mg/m2 IV D1, 8, 15 q28 Days; Re-evaluate Every 3 Cycles, Treat until Complete Response, Unacceptable Toxicity, or Disease Progression   A cycle is every 28 days:     Topotecan (Hycamtin(R)) 4 mg/m2 in 100 mL NS IV over 30 minutes days 1, 8, and 15 Dose Mod: None Additional Orders: Re-evaluate after every 2 cycles.  Treat to maximum response or toxicity.  **Always confirm dose/schedule in your pharmacy ordering system**    Patient Characteristics: Fourth Line and Beyond, BRCA Mutation Absent/Unknown Stage Grouping: IV AJCC T Stage: X AJCC N Stage: X AJCC M Stage: 1 Line of Therapy: Fourth Line and Beyond BRCA Mutation Status: Did Not Order Test Would you be surprised if this patient died  in the next year? I would be surprised if this patient died in the next year  Intent of Therapy: Non-Curative / Palliative Intent, Discussed with Patient

## 2016-12-22 ENCOUNTER — Telehealth: Payer: Self-pay | Admitting: *Deleted

## 2016-12-22 NOTE — Telephone Encounter (Signed)
Pt left a VM stating wanting to give Dr Jannifer Rodney an update " on neuropathy he is monitoring ".  Cassandra Nichols stated " slight increase in the neuropathy in the bottom of my feet but now beginning to have some in my fingertips and hands"  Pt wanted MD to know due to next scheduled treatment 12/24/2016.

## 2016-12-23 ENCOUNTER — Other Ambulatory Visit: Payer: Self-pay

## 2016-12-23 DIAGNOSIS — C569 Malignant neoplasm of unspecified ovary: Secondary | ICD-10-CM

## 2016-12-24 ENCOUNTER — Ambulatory Visit (HOSPITAL_BASED_OUTPATIENT_CLINIC_OR_DEPARTMENT_OTHER): Payer: Medicare Other

## 2016-12-24 ENCOUNTER — Other Ambulatory Visit: Payer: Self-pay | Admitting: *Deleted

## 2016-12-24 ENCOUNTER — Other Ambulatory Visit: Payer: Self-pay | Admitting: Oncology

## 2016-12-24 ENCOUNTER — Other Ambulatory Visit (HOSPITAL_BASED_OUTPATIENT_CLINIC_OR_DEPARTMENT_OTHER): Payer: Medicare Other

## 2016-12-24 VITALS — BP 150/72 | HR 88 | Temp 97.6°F | Resp 22

## 2016-12-24 DIAGNOSIS — Z5111 Encounter for antineoplastic chemotherapy: Secondary | ICD-10-CM | POA: Diagnosis not present

## 2016-12-24 DIAGNOSIS — C569 Malignant neoplasm of unspecified ovary: Secondary | ICD-10-CM

## 2016-12-24 DIAGNOSIS — C772 Secondary and unspecified malignant neoplasm of intra-abdominal lymph nodes: Secondary | ICD-10-CM

## 2016-12-24 DIAGNOSIS — C787 Secondary malignant neoplasm of liver and intrahepatic bile duct: Secondary | ICD-10-CM

## 2016-12-24 DIAGNOSIS — Z5112 Encounter for antineoplastic immunotherapy: Secondary | ICD-10-CM | POA: Diagnosis not present

## 2016-12-24 LAB — COMPREHENSIVE METABOLIC PANEL
ALT: 10 U/L (ref 0–55)
AST: 26 U/L (ref 5–34)
Albumin: 3.5 g/dL (ref 3.5–5.0)
Alkaline Phosphatase: 90 U/L (ref 40–150)
Anion Gap: 12 mEq/L — ABNORMAL HIGH (ref 3–11)
BILIRUBIN TOTAL: 0.35 mg/dL (ref 0.20–1.20)
BUN: 21.5 mg/dL (ref 7.0–26.0)
CO2: 22 meq/L (ref 22–29)
Calcium: 9.5 mg/dL (ref 8.4–10.4)
Chloride: 103 mEq/L (ref 98–109)
Creatinine: 1.1 mg/dL (ref 0.6–1.1)
EGFR: 49 mL/min/{1.73_m2} — ABNORMAL LOW (ref >90)
GLUCOSE: 105 mg/dL (ref 70–140)
Potassium: 4.4 mEq/L (ref 3.5–5.1)
Sodium: 137 mEq/L (ref 136–145)
TOTAL PROTEIN: 7 g/dL (ref 6.4–8.3)

## 2016-12-24 LAB — CBC WITH DIFFERENTIAL/PLATELET
BASO%: 0 % (ref 0.0–2.0)
Basophils Absolute: 0 10*3/uL (ref 0.0–0.1)
EOS%: 0 % (ref 0.0–7.0)
Eosinophils Absolute: 0 10*3/uL (ref 0.0–0.5)
HEMATOCRIT: 26.2 % — AB (ref 34.8–46.6)
HGB: 8.5 g/dL — ABNORMAL LOW (ref 11.6–15.9)
LYMPH%: 12.2 % — AB (ref 14.0–49.7)
MCH: 32.2 pg (ref 25.1–34.0)
MCHC: 32.4 g/dL (ref 31.5–36.0)
MCV: 99.2 fL (ref 79.5–101.0)
MONO#: 0.4 10*3/uL (ref 0.1–0.9)
MONO%: 4.7 % (ref 0.0–14.0)
NEUT%: 83.1 % — ABNORMAL HIGH (ref 38.4–76.8)
NEUTROS ABS: 6.7 10*3/uL — AB (ref 1.5–6.5)
Platelets: 188 10*3/uL (ref 145–400)
RBC: 2.64 10*6/uL — ABNORMAL LOW (ref 3.70–5.45)
RDW: 18.9 % — ABNORMAL HIGH (ref 11.2–14.5)
WBC: 8.1 10*3/uL (ref 3.9–10.3)
lymph#: 1 10*3/uL (ref 0.9–3.3)

## 2016-12-24 LAB — UA PROTEIN, DIPSTICK - CHCC: Protein, ur: <30 mg/dL

## 2016-12-24 MED ORDER — ONDANSETRON 8 MG PO TBDP
8.0000 mg | ORAL_TABLET | Freq: Once | ORAL | Status: AC
  Administered 2016-12-24: 8 mg via ORAL

## 2016-12-24 MED ORDER — PROCHLORPERAZINE MALEATE 10 MG PO TABS
ORAL_TABLET | ORAL | Status: AC
  Filled 2016-12-24: qty 1

## 2016-12-24 MED ORDER — SODIUM CHLORIDE 0.9 % IV SOLN
15.0000 mg/kg | Freq: Once | INTRAVENOUS | Status: AC
  Administered 2016-12-24: 800 mg via INTRAVENOUS
  Filled 2016-12-24: qty 32

## 2016-12-24 MED ORDER — HEPARIN SOD (PORK) LOCK FLUSH 100 UNIT/ML IV SOLN
500.0000 [IU] | Freq: Once | INTRAVENOUS | Status: AC | PRN
  Administered 2016-12-24: 500 [IU]
  Filled 2016-12-24: qty 5

## 2016-12-24 MED ORDER — SODIUM CHLORIDE 0.9% FLUSH
10.0000 mL | INTRAVENOUS | Status: DC | PRN
  Administered 2016-12-24: 10 mL
  Filled 2016-12-24: qty 10

## 2016-12-24 MED ORDER — SODIUM CHLORIDE 0.9 % IV SOLN
Freq: Once | INTRAVENOUS | Status: AC
  Administered 2016-12-24: 16:00:00 via INTRAVENOUS

## 2016-12-24 MED ORDER — PROCHLORPERAZINE MALEATE 10 MG PO TABS
10.0000 mg | ORAL_TABLET | Freq: Once | ORAL | Status: AC
  Administered 2016-12-24: 10 mg via ORAL

## 2016-12-24 MED ORDER — TOPOTECAN HCL CHEMO INJECTION 4 MG
4.0000 mg/m2 | Freq: Once | INTRAVENOUS | Status: AC
  Administered 2016-12-24: 6 mg via INTRAVENOUS
  Filled 2016-12-24: qty 6

## 2016-12-24 NOTE — Progress Notes (Signed)
Pt infusing topotecan for the first 10 minutes and complained of nausea. Pt received compazine po at 1615. Pt states that she does not feel like vomiting, but has a "queezy feeling." notified Dr.Magrinat's nurse and obtained order for zofran odt, since pt is driving herself home tonight. Topotecan still infusing and pt symptoms has not worsened at this time. Pt has not reported any other complaints.  1800- Pt states that she felt better after a few minutes of taking zofran odt. Pt states that she gets very anxious and sometimes that makes her nausea worse. Pt instructed to take decadron at home tonight to help with nausea. And tomorrow after chemo. Pt verbalized understanding. F/u chemo call placed for Monday. Pt instructed to go to emergency room if she develops cp,sob, uncontrolled n/v and dehydration. Pt voiced understanding.

## 2016-12-24 NOTE — Progress Notes (Signed)
1720-Patient complaining of a "wave of nausea".  Dr. Virgie Dad RN, Val notified and order received per Dr. Jana Hakim to give Zofran 8 mg ODT now.  VS stable.

## 2016-12-24 NOTE — Patient Instructions (Signed)
[facility] [facility] Discharge Instructions for Patients Receiving Chemotherapy  Today you received the following chemotherapy agents: Topotecan, Avastin   To help prevent nausea and vomiting after your treatment, we encourage you to take your nausea medication as prescribed.    If you develop nausea and vomiting that is not controlled by your nausea medication, call the clinic.   BELOW ARE SYMPTOMS THAT SHOULD BE REPORTED IMMEDIATELY:  *FEVER GREATER THAN 100.5 F  *CHILLS WITH OR WITHOUT FEVER  NAUSEA AND VOMITING THAT IS NOT CONTROLLED WITH YOUR NAUSEA MEDICATION  *UNUSUAL SHORTNESS OF BREATH  *UNUSUAL BRUISING OR BLEEDING  TENDERNESS IN MOUTH AND THROAT WITH OR WITHOUT PRESENCE OF ULCERS  *URINARY PROBLEMS  *BOWEL PROBLEMS  UNUSUAL RASH Items with * indicate a potential emergency and should be followed up as soon as possible.  Feel free to call the clinic you have any questions or concerns. The clinic phone number is [phone].  Please show the [facility] at check-in to the Emergency Department and triage nurse.  Bevacizumab injection What is this medicine? BEVACIZUMAB (be va SIZ yoo mab) is a monoclonal antibody. It is used to treat cervical cancer, colorectal cancer, glioblastoma multiforme, non-small cell lung cancer (NSCLC), ovarian cancer, and renal cell cancer. This medicine may be used for other purposes; ask your health care provider or pharmacist if you have questions. COMMON BRAND NAME(S): Avastin What should I tell my health care provider before I take this medicine? They need to know if you have any of these conditions: -blood clots -heart disease, including heart failure, heart attack, or chest pain (angina) -high blood pressure -infection (especially a virus infection such as chickenpox, cold sores, or herpes) -kidney disease -lung disease -prior chemotherapy with doxorubicin, daunorubicin, epirubicin, or other anthracycline type  chemotherapy agents -recent or ongoing radiation therapy -recent surgery -stroke -an unusual or allergic reaction to bevacizumab, hamster proteins, mouse proteins, other medicines, foods, dyes, or preservatives -pregnant or trying to get pregnant -breast-feeding How should I use this medicine? This medicine is for infusion into a vein. It is given by a health care professional in a hospital or clinic setting. Talk to your pediatrician regarding the use of this medicine in children. Special care may be needed. Overdosage: If you think you have taken too much of this medicine contact a poison control center or emergency room at once. NOTE: This medicine is only for you. Do not share this medicine with others. What if I miss a dose? It is important not to miss your dose. Call your doctor or health care professional if you are unable to keep an appointment. What may interact with this medicine? Interactions are not expected. This list may not describe all possible interactions. Give your health care provider a list of all the medicines, herbs, non-prescription drugs, or dietary supplements you use. Also tell them if you smoke, drink alcohol, or use illegal drugs. Some items may interact with your medicine. What should I watch for while using this medicine? Your condition will be monitored carefully while you are receiving this medicine. You will need important blood work and urine testing done while you are taking this medicine. During your treatment, let your health care professional know if you have any unusual symptoms, such as difficulty breathing. This medicine may rarely cause 'gastrointestinal perforation' (holes in the stomach, intestines or colon), a serious side effect requiring surgery to repair. This medicine should be started at least 28 days following major surgery and the site  of the surgery should be totally healed. Check with your doctor before scheduling dental work or surgery  while you are receiving this treatment. Talk to your doctor if you have recently had surgery or if you have a wound that has not healed. Do not become pregnant while taking this medicine or for 6 months after stopping it. Women should inform their doctor if they wish to become pregnant or think they might be pregnant. There is a potential for serious side effects to an unborn child. Talk to your health care professional or pharmacist for more information. Do not breast-feed an infant while taking this medicine. This medicine has caused ovarian failure in some women. This medicine may interfere with the ability to have a child. You should talk to your doctor or health care professional if you are concerned about your fertility. What side effects may I notice from receiving this medicine? Side effects that you should report to your doctor or health care professional as soon as possible: -allergic reactions like skin rash, itching or hives, swelling of the face, lips, or tongue -breathing problems -changes in vision -chest pain -confusion -jaw pain, especially after dental work -mouth sores -seizures -severe abdominal pain -severe headache -signs of decreased platelets or bleeding - bruising, pinpoint red spots on the skin, black, tarry stools, nosebleeds, blood in the urine -signs of infection - fever or chills, cough, sore throat, pain or trouble passing urine -sudden numbness or weakness of the face, arm or leg -swelling of legs or ankles -symptoms of a stroke: change in mental awareness, inability to talk or move one side of the body (especially in patients with lung cancer) -trouble passing urine or change in the amount of urine -trouble speaking or understanding -trouble walking, dizziness, loss of balance or coordination Side effects that usually do not require medical attention (report to your doctor or health care professional if they continue or are  bothersome): -constipation -diarrhea -dry skin -headache -loss of appetite -nausea, vomiting This list may not describe all possible side effects. Call your doctor for medical advice about side effects. You may report side effects to FDA at 1-800-FDA-1088. Where should I keep my medicine? This drug is given in a hospital or clinic and will not be stored at home. NOTE: This sheet is a summary. It may not cover all possible information. If you have questions about this medicine, talk to your doctor, pharmacist, or health care provider.  2017 Elsevier/Gold Standard (2015-11-28 15:28:53) Topotecan injection What is this medicine? TOPOTECAN (TOE poe TEE kan) is a chemotherapy drug. It is used to treat lung cancer, ovarian cancer, and cervical cancer. This medicine may be used for other purposes; ask your health care provider or pharmacist if you have questions. COMMON BRAND NAME(S): Hycamtin What should I tell my health care provider before I take this medicine? They need to know if you have any of these conditions: -blood disorders -dehydration -diarrhea -immune system problems -infection (especially a virus infection such as chickenpox, cold sores, or herpes) -kidney disease -low blood counts, like low white cell, platelet, or red cell counts -recent or ongoing radiation therapy -an unusual or allergic reaction to topotecan, other medicines, foods, dyes, or preservatives -pregnant or trying to get pregnant -breast-feeding How should I use this medicine? This medicine is for infusion into a vein. It is usually given by a health care professional in a hospital or clinic setting. In rare cases, you might get this medicine at home. You will be taught  how to give this medicine. Use exactly as directed. Take your medicine at regular intervals. Do not take your medicine more often than directed. It is important that you put your used needles and syringes in a special sharps container. Do not  put them in a trash can. If you do not have a sharps container, call your pharmacist or healthcare provider to get one. Talk to your pediatrician regarding the use of this medicine in children. Special care may be needed. Overdosage: If you think you have taken too much of this medicine contact a poison control center or emergency room at once. NOTE: This medicine is only for you. Do not share this medicine with others. What if I miss a dose? It is important not to miss your dose. Call your doctor or health care professional if you are unable to keep an appointment. What may interact with this medicine? -amiodarone -azithromycin -captopril -carvedilol -certain medications for fungal infections like ketoconazole and itraconazole -clarithromycin -conivaptan -cyclosporine -diltiazem -dronedarone -eltrombopag -erythromycin -felodipine -grapefruit juice -lopinavir -quercetin -quinidine -ranolazine -ritonavir -ticagrelor -verapamil This list may not describe all possible interactions. Give your health care provider a list of all the medicines, herbs, non-prescription drugs, or dietary supplements you use. Also tell them if you smoke, drink alcohol, or use illegal drugs. Some items may interact with your medicine. What should I watch for while using this medicine? This drug may make you feel generally unwell. This is not uncommon, as chemotherapy can affect healthy cells as well as cancer cells. Report any side effects. Continue your course of treatment even though you feel ill unless your doctor tells you to stop. Call your doctor or health care professional for advice if you get a fever, chills or sore throat, or other symptoms of a cold or flu. Do not treat yourself. This drug decreases your body's ability to fight infections. Try to avoid being around people who are sick. This medicine may increase your risk to bruise or bleed. Call your doctor or health care professional if you notice  any unusual bleeding. Be careful brushing and flossing your teeth or using a toothpick because you may get an infection or bleed more easily. If you have any dental work done, tell your dentist you are receiving this medicine. Avoid taking products that contain aspirin, acetaminophen, ibuprofen, naproxen, or ketoprofen unless instructed by your doctor. These medicines may hide a fever. Do not become pregnant while taking this medicine or within 1 month of stopping it. Women should inform their doctor if they wish to become pregnant or think they might be pregnant. There is a potential for serious side effects to an unborn child. Talk to your health care professional or pharmacist for more information. Do not breast-feed an infant while taking this medicine. Men must use a latex condom during sexual contact with a woman while taking this medicine and for 3 months after you stop taking this medicine. A latex condom is needed even if you have had a vasectomy. Contact your doctor right away if your partner becomes pregnant. Do not donate sperm while taking this medicine and for 3 months after you stop taking this medicine. What side effects may I notice from receiving this medicine? Side effects that you should report to your doctor or health care professional as soon as possible: -allergic reactions like skin rash, itching or hives, swelling of the face, lips, or tongue -breathing difficulties -diarrhea -dizziness -fever or chills, sore throat -mouth sores or pain -pain, tingling,  numbness in the hands or feet -unusual bleeding or bruising -unusually weak or tired -yellowing of the eyes or skin Side effects that usually do not require medical attention (report to your doctor or health care professional if they continue or are bothersome): -hair loss -headache -loss of appetite -nausea, vomiting -stomach pain This list may not describe all possible side effects. Call your doctor for medical advice  about side effects. You may report side effects to FDA at 1-800-FDA-1088. Where should I keep my medicine? Keep out of the reach of children. This drug is usually given in a hospital or clinic and will not be stored at home. In rare cases, this medicine may be given at home. If you are using this medicine at home, you will be instructed on how to store this medicine. Throw away any unused medicine after the expiration date on the label. NOTE: This sheet is a summary. It may not cover all possible information. If you have questions about this medicine, talk to your doctor, pharmacist, or health care provider.  2017 Elsevier/Gold Standard (2014-06-05 11:14:18)

## 2016-12-25 LAB — CA 125

## 2017-01-14 ENCOUNTER — Other Ambulatory Visit: Payer: Self-pay | Admitting: Oncology

## 2017-01-14 ENCOUNTER — Other Ambulatory Visit (HOSPITAL_BASED_OUTPATIENT_CLINIC_OR_DEPARTMENT_OTHER): Payer: Medicare Other

## 2017-01-14 ENCOUNTER — Ambulatory Visit (HOSPITAL_BASED_OUTPATIENT_CLINIC_OR_DEPARTMENT_OTHER): Payer: Medicare Other

## 2017-01-14 VITALS — BP 143/75 | HR 87 | Temp 98.4°F | Resp 19

## 2017-01-14 DIAGNOSIS — C772 Secondary and unspecified malignant neoplasm of intra-abdominal lymph nodes: Secondary | ICD-10-CM

## 2017-01-14 DIAGNOSIS — C569 Malignant neoplasm of unspecified ovary: Secondary | ICD-10-CM

## 2017-01-14 DIAGNOSIS — Z5111 Encounter for antineoplastic chemotherapy: Secondary | ICD-10-CM

## 2017-01-14 DIAGNOSIS — C787 Secondary malignant neoplasm of liver and intrahepatic bile duct: Secondary | ICD-10-CM

## 2017-01-14 LAB — CBC WITH DIFFERENTIAL/PLATELET
BASO%: 0.1 % (ref 0.0–2.0)
Basophils Absolute: 0 10*3/uL (ref 0.0–0.1)
EOS ABS: 0 10*3/uL (ref 0.0–0.5)
EOS%: 0.1 % (ref 0.0–7.0)
HCT: 26.8 % — ABNORMAL LOW (ref 34.8–46.6)
HEMOGLOBIN: 8.8 g/dL — AB (ref 11.6–15.9)
LYMPH%: 8.3 % — ABNORMAL LOW (ref 14.0–49.7)
MCH: 31.2 pg (ref 25.1–34.0)
MCHC: 32.7 g/dL (ref 31.5–36.0)
MCV: 95.6 fL (ref 79.5–101.0)
MONO#: 0.1 10*3/uL (ref 0.1–0.9)
MONO%: 1.7 % (ref 0.0–14.0)
NEUT%: 89.8 % — ABNORMAL HIGH (ref 38.4–76.8)
NEUTROS ABS: 5.6 10*3/uL (ref 1.5–6.5)
PLATELETS: 174 10*3/uL (ref 145–400)
RBC: 2.8 10*6/uL — AB (ref 3.70–5.45)
RDW: 19.1 % — ABNORMAL HIGH (ref 11.2–14.5)
WBC: 6.3 10*3/uL (ref 3.9–10.3)
lymph#: 0.5 10*3/uL — ABNORMAL LOW (ref 0.9–3.3)

## 2017-01-14 LAB — COMPREHENSIVE METABOLIC PANEL
ALBUMIN: 3.4 g/dL — AB (ref 3.5–5.0)
ALK PHOS: 85 U/L (ref 40–150)
ALT: 8 U/L (ref 0–55)
ANION GAP: 13 meq/L — AB (ref 3–11)
AST: 22 U/L (ref 5–34)
BILIRUBIN TOTAL: 0.45 mg/dL (ref 0.20–1.20)
BUN: 22.4 mg/dL (ref 7.0–26.0)
CO2: 21 mEq/L — ABNORMAL LOW (ref 22–29)
Calcium: 9.5 mg/dL (ref 8.4–10.4)
Chloride: 106 mEq/L (ref 98–109)
Creatinine: 1 mg/dL (ref 0.6–1.1)
EGFR: 58 mL/min/{1.73_m2} — AB (ref >90)
Glucose: 120 mg/dl (ref 70–140)
Potassium: 4 mEq/L (ref 3.5–5.1)
Sodium: 140 mEq/L (ref 136–145)
TOTAL PROTEIN: 6.8 g/dL (ref 6.4–8.3)

## 2017-01-14 MED ORDER — PROCHLORPERAZINE MALEATE 10 MG PO TABS
ORAL_TABLET | ORAL | Status: AC
  Filled 2017-01-14: qty 1

## 2017-01-14 MED ORDER — SODIUM CHLORIDE 0.9% FLUSH
10.0000 mL | INTRAVENOUS | Status: DC | PRN
  Administered 2017-01-14: 10 mL
  Filled 2017-01-14: qty 10

## 2017-01-14 MED ORDER — PROCHLORPERAZINE MALEATE 10 MG PO TABS
10.0000 mg | ORAL_TABLET | Freq: Once | ORAL | Status: AC
  Administered 2017-01-14: 10 mg via ORAL

## 2017-01-14 MED ORDER — TOPOTECAN HCL CHEMO INJECTION 4 MG
4.0000 mg/m2 | Freq: Once | INTRAVENOUS | Status: AC
  Administered 2017-01-14: 6 mg via INTRAVENOUS
  Filled 2017-01-14: qty 6

## 2017-01-14 MED ORDER — SODIUM CHLORIDE 0.9 % IV SOLN
Freq: Once | INTRAVENOUS | Status: AC
  Administered 2017-01-14: 17:00:00 via INTRAVENOUS

## 2017-01-14 MED ORDER — HEPARIN SOD (PORK) LOCK FLUSH 100 UNIT/ML IV SOLN
500.0000 [IU] | Freq: Once | INTRAVENOUS | Status: AC | PRN
  Administered 2017-01-14: 500 [IU]
  Filled 2017-01-14: qty 5

## 2017-01-14 NOTE — Patient Instructions (Signed)
[facility] Discharge Instructions for Patients Receiving Chemotherapy  Today you received the following chemotherapy agents Topotecan.  To help prevent nausea and vomiting after your treatment, we encourage you to take your nausea medication as prescribed.   If you develop nausea and vomiting that is not controlled by your nausea medication, call the clinic.   BELOW ARE SYMPTOMS THAT SHOULD BE REPORTED IMMEDIATELY:  *FEVER GREATER THAN 100.5 F  *CHILLS WITH OR WITHOUT FEVER  NAUSEA AND VOMITING THAT IS NOT CONTROLLED WITH YOUR NAUSEA MEDICATION  *UNUSUAL SHORTNESS OF BREATH  *UNUSUAL BRUISING OR BLEEDING  TENDERNESS IN MOUTH AND THROAT WITH OR WITHOUT PRESENCE OF ULCERS  *URINARY PROBLEMS  *BOWEL PROBLEMS  UNUSUAL RASH Items with * indicate a potential emergency and should be followed up as soon as possible.  Feel free to call the clinic you have any questions or concerns. The clinic phone number is [phone].  Please show the CHEMO ALERT CARD at check-in to the Emergency Department and triage nurse.   

## 2017-01-15 LAB — CA 125

## 2017-01-16 ENCOUNTER — Other Ambulatory Visit: Payer: Self-pay | Admitting: Oncology

## 2017-02-04 ENCOUNTER — Other Ambulatory Visit: Payer: Medicare Other

## 2017-02-04 ENCOUNTER — Ambulatory Visit: Payer: Medicare Other | Admitting: Oncology

## 2017-02-04 ENCOUNTER — Ambulatory Visit: Payer: Medicare Other

## 2017-02-05 NOTE — Progress Notes (Signed)
No show

## 2017-02-07 ENCOUNTER — Telehealth: Payer: Self-pay | Admitting: Oncology

## 2017-02-07 NOTE — Telephone Encounter (Signed)
lvm to inform pt of 2/23 appts at 3 pm per LOS

## 2017-02-10 ENCOUNTER — Other Ambulatory Visit: Payer: Self-pay

## 2017-02-10 DIAGNOSIS — C569 Malignant neoplasm of unspecified ovary: Secondary | ICD-10-CM

## 2017-02-11 ENCOUNTER — Other Ambulatory Visit (HOSPITAL_BASED_OUTPATIENT_CLINIC_OR_DEPARTMENT_OTHER): Payer: Medicare Other

## 2017-02-11 ENCOUNTER — Other Ambulatory Visit: Payer: Self-pay | Admitting: Oncology

## 2017-02-11 ENCOUNTER — Ambulatory Visit (HOSPITAL_BASED_OUTPATIENT_CLINIC_OR_DEPARTMENT_OTHER): Payer: Medicare Other

## 2017-02-11 VITALS — BP 156/68 | HR 85 | Temp 98.0°F | Resp 20

## 2017-02-11 DIAGNOSIS — C787 Secondary malignant neoplasm of liver and intrahepatic bile duct: Secondary | ICD-10-CM | POA: Diagnosis not present

## 2017-02-11 DIAGNOSIS — Z5111 Encounter for antineoplastic chemotherapy: Secondary | ICD-10-CM

## 2017-02-11 DIAGNOSIS — Z5112 Encounter for antineoplastic immunotherapy: Secondary | ICD-10-CM | POA: Diagnosis not present

## 2017-02-11 DIAGNOSIS — C569 Malignant neoplasm of unspecified ovary: Secondary | ICD-10-CM

## 2017-02-11 DIAGNOSIS — C772 Secondary and unspecified malignant neoplasm of intra-abdominal lymph nodes: Secondary | ICD-10-CM

## 2017-02-11 LAB — COMPREHENSIVE METABOLIC PANEL
ALBUMIN: 3.6 g/dL (ref 3.5–5.0)
ALK PHOS: 79 U/L (ref 40–150)
ALT: 8 U/L (ref 0–55)
ANION GAP: 11 meq/L (ref 3–11)
AST: 22 U/L (ref 5–34)
BILIRUBIN TOTAL: 0.52 mg/dL (ref 0.20–1.20)
BUN: 25.9 mg/dL (ref 7.0–26.0)
CALCIUM: 9.4 mg/dL (ref 8.4–10.4)
CO2: 23 mEq/L (ref 22–29)
CREATININE: 0.8 mg/dL (ref 0.6–1.1)
Chloride: 105 mEq/L (ref 98–109)
EGFR: 70 mL/min/{1.73_m2} — ABNORMAL LOW (ref >90)
Glucose: 104 mg/dl (ref 70–140)
Potassium: 4.1 mEq/L (ref 3.5–5.1)
Sodium: 139 mEq/L (ref 136–145)
Total Protein: 6.9 g/dL (ref 6.4–8.3)

## 2017-02-11 LAB — CBC WITH DIFFERENTIAL/PLATELET
BASO%: 0 % (ref 0.0–2.0)
BASOS ABS: 0 10*3/uL (ref 0.0–0.1)
EOS%: 0 % (ref 0.0–7.0)
Eosinophils Absolute: 0 10*3/uL (ref 0.0–0.5)
HCT: 28.5 % — ABNORMAL LOW (ref 34.8–46.6)
HEMOGLOBIN: 8.7 g/dL — AB (ref 11.6–15.9)
LYMPH#: 0.7 10*3/uL — AB (ref 0.9–3.3)
LYMPH%: 8.7 % — ABNORMAL LOW (ref 14.0–49.7)
MCH: 28.3 pg (ref 25.1–34.0)
MCHC: 30.5 g/dL — AB (ref 31.5–36.0)
MCV: 92.8 fL (ref 79.5–101.0)
MONO#: 0.2 10*3/uL (ref 0.1–0.9)
MONO%: 2.2 % (ref 0.0–14.0)
NEUT#: 7 10*3/uL — ABNORMAL HIGH (ref 1.5–6.5)
NEUT%: 89.1 % — AB (ref 38.4–76.8)
PLATELETS: 155 10*3/uL (ref 145–400)
RBC: 3.07 10*6/uL — ABNORMAL LOW (ref 3.70–5.45)
RDW: 20.1 % — AB (ref 11.2–14.5)
WBC: 7.8 10*3/uL (ref 3.9–10.3)

## 2017-02-11 LAB — UA PROTEIN, DIPSTICK - CHCC: PROTEIN: NEGATIVE mg/dL

## 2017-02-11 MED ORDER — SODIUM CHLORIDE 0.9% FLUSH
10.0000 mL | INTRAVENOUS | Status: DC | PRN
Start: 2017-02-11 — End: 2017-02-11
  Administered 2017-02-11: 10 mL
  Filled 2017-02-11: qty 10

## 2017-02-11 MED ORDER — PROCHLORPERAZINE MALEATE 10 MG PO TABS
ORAL_TABLET | ORAL | Status: AC
  Filled 2017-02-11: qty 1

## 2017-02-11 MED ORDER — HEPARIN SOD (PORK) LOCK FLUSH 100 UNIT/ML IV SOLN
500.0000 [IU] | Freq: Once | INTRAVENOUS | Status: AC | PRN
  Administered 2017-02-11: 500 [IU]
  Filled 2017-02-11: qty 5

## 2017-02-11 MED ORDER — SODIUM CHLORIDE 0.9 % IV SOLN
Freq: Once | INTRAVENOUS | Status: AC
  Administered 2017-02-11: 15:00:00 via INTRAVENOUS

## 2017-02-11 MED ORDER — PROCHLORPERAZINE MALEATE 10 MG PO TABS
10.0000 mg | ORAL_TABLET | Freq: Once | ORAL | Status: AC
Start: 2017-02-11 — End: 2017-02-11
  Administered 2017-02-11: 10 mg via ORAL

## 2017-02-11 MED ORDER — TOPOTECAN HCL CHEMO INJECTION 4 MG
4.0000 mg/m2 | Freq: Once | INTRAVENOUS | Status: AC
  Administered 2017-02-11: 6 mg via INTRAVENOUS
  Filled 2017-02-11: qty 6

## 2017-02-11 MED ORDER — BEVACIZUMAB CHEMO INJECTION 400 MG/16ML
15.0000 mg/kg | Freq: Once | INTRAVENOUS | Status: AC
  Administered 2017-02-11: 800 mg via INTRAVENOUS
  Filled 2017-02-11: qty 32

## 2017-02-11 NOTE — Patient Instructions (Signed)
[facility] Discharge Instructions for Patients Receiving Chemotherapy  Today you received the following chemotherapy agents: Topotecan, Avastin   To help prevent nausea and vomiting after your treatment, we encourage you to take your nausea medication as prescribed.    If you develop nausea and vomiting that is not controlled by your nausea medication, call the clinic.   BELOW ARE SYMPTOMS THAT SHOULD BE REPORTED IMMEDIATELY:  *FEVER GREATER THAN 100.5 F  *CHILLS WITH OR WITHOUT FEVER  NAUSEA AND VOMITING THAT IS NOT CONTROLLED WITH YOUR NAUSEA MEDICATION  *UNUSUAL SHORTNESS OF BREATH  *UNUSUAL BRUISING OR BLEEDING  TENDERNESS IN MOUTH AND THROAT WITH OR WITHOUT PRESENCE OF ULCERS  *URINARY PROBLEMS  *BOWEL PROBLEMS  UNUSUAL RASH Items with * indicate a potential emergency and should be followed up as soon as possible.  Feel free to call the clinic you have any questions or concerns. The clinic phone number is [phone].  Please show the [place] at check-in to the Emergency Department and triage nurse.  Bevacizumab injection What is this medicine? BEVACIZUMAB (be va SIZ yoo mab) is a monoclonal antibody. It is used to treat cervical cancer, colorectal cancer, glioblastoma multiforme, non-small cell lung cancer (NSCLC), ovarian cancer, and renal cell cancer. This medicine may be used for other purposes; ask your health care provider or pharmacist if you have questions. COMMON BRAND NAME(S): Avastin What should I tell my health care provider before I take this medicine? They need to know if you have any of these conditions: -blood clots -heart disease, including heart failure, heart attack, or chest pain (angina) -high blood pressure -infection (especially a virus infection such as chickenpox, cold sores, or herpes) -kidney disease -lung disease -prior chemotherapy with doxorubicin, daunorubicin, epirubicin, or other anthracycline type  chemotherapy agents -recent or ongoing radiation therapy -recent surgery -stroke -an unusual or allergic reaction to bevacizumab, hamster proteins, mouse proteins, other medicines, foods, dyes, or preservatives -pregnant or trying to get pregnant -breast-feeding How should I use this medicine? This medicine is for infusion into a vein. It is given by a health care professional in a hospital or clinic setting. Talk to your pediatrician regarding the use of this medicine in children. Special care may be needed. Overdosage: If you think you have taken too much of this medicine contact a poison control center or emergency room at once. NOTE: This medicine is only for you. Do not share this medicine with others. What if I miss a dose? It is important not to miss your dose. Call your doctor or health care professional if you are unable to keep an appointment. What may interact with this medicine? Interactions are not expected. This list may not describe all possible interactions. Give your health care provider a list of all the medicines, herbs, non-prescription drugs, or dietary supplements you use. Also tell them if you smoke, drink alcohol, or use illegal drugs. Some items may interact with your medicine. What should I watch for while using this medicine? Your condition will be monitored carefully while you are receiving this medicine. You will need important blood work and urine testing done while you are taking this medicine. During your treatment, let your health care professional know if you have any unusual symptoms, such as difficulty breathing. This medicine may rarely cause 'gastrointestinal perforation' (holes in the stomach, intestines or colon), a serious side effect requiring surgery to repair. This medicine should be started at least 28 days following major surgery and the site  of the surgery should be totally healed. Check with your doctor before scheduling dental work or surgery  while you are receiving this treatment. Talk to your doctor if you have recently had surgery or if you have a wound that has not healed. Do not become pregnant while taking this medicine or for 6 months after stopping it. Women should inform their doctor if they wish to become pregnant or think they might be pregnant. There is a potential for serious side effects to an unborn child. Talk to your health care professional or pharmacist for more information. Do not breast-feed an infant while taking this medicine. This medicine has caused ovarian failure in some women. This medicine may interfere with the ability to have a child. You should talk to your doctor or health care professional if you are concerned about your fertility. What side effects may I notice from receiving this medicine? Side effects that you should report to your doctor or health care professional as soon as possible: -allergic reactions like skin rash, itching or hives, swelling of the face, lips, or tongue -breathing problems -changes in vision -chest pain -confusion -jaw pain, especially after dental work -mouth sores -seizures -severe abdominal pain -severe headache -signs of decreased platelets or bleeding - bruising, pinpoint red spots on the skin, black, tarry stools, nosebleeds, blood in the urine -signs of infection - fever or chills, cough, sore throat, pain or trouble passing urine -sudden numbness or weakness of the face, arm or leg -swelling of legs or ankles -symptoms of a stroke: change in mental awareness, inability to talk or move one side of the body (especially in patients with lung cancer) -trouble passing urine or change in the amount of urine -trouble speaking or understanding -trouble walking, dizziness, loss of balance or coordination Side effects that usually do not require medical attention (report to your doctor or health care professional if they continue or are  bothersome): -constipation -diarrhea -dry skin -headache -loss of appetite -nausea, vomiting This list may not describe all possible side effects. Call your doctor for medical advice about side effects. You may report side effects to FDA at 1-800-FDA-1088. Where should I keep my medicine? This drug is given in a hospital or clinic and will not be stored at home. NOTE: This sheet is a summary. It may not cover all possible information. If you have questions about this medicine, talk to your doctor, pharmacist, or health care provider.  2017 Elsevier/Gold Standard (2015-11-28 15:28:53) Topotecan injection What is this medicine? TOPOTECAN (TOE poe TEE kan) is a chemotherapy drug. It is used to treat lung cancer, ovarian cancer, and cervical cancer. This medicine may be used for other purposes; ask your health care provider or pharmacist if you have questions. COMMON BRAND NAME(S): Hycamtin What should I tell my health care provider before I take this medicine? They need to know if you have any of these conditions: -blood disorders -dehydration -diarrhea -immune system problems -infection (especially a virus infection such as chickenpox, cold sores, or herpes) -kidney disease -low blood counts, like low white cell, platelet, or red cell counts -recent or ongoing radiation therapy -an unusual or allergic reaction to topotecan, other medicines, foods, dyes, or preservatives -pregnant or trying to get pregnant -breast-feeding How should I use this medicine? This medicine is for infusion into a vein. It is usually given by a health care professional in a hospital or clinic setting. In rare cases, you might get this medicine at home. You will be taught  how to give this medicine. Use exactly as directed. Take your medicine at regular intervals. Do not take your medicine more often than directed. It is important that you put your used needles and syringes in a special sharps container. Do not  put them in a trash can. If you do not have a sharps container, call your pharmacist or healthcare provider to get one. Talk to your pediatrician regarding the use of this medicine in children. Special care may be needed. Overdosage: If you think you have taken too much of this medicine contact a poison control center or emergency room at once. NOTE: This medicine is only for you. Do not share this medicine with others. What if I miss a dose? It is important not to miss your dose. Call your doctor or health care professional if you are unable to keep an appointment. What may interact with this medicine? -amiodarone -azithromycin -captopril -carvedilol -certain medications for fungal infections like ketoconazole and itraconazole -clarithromycin -conivaptan -cyclosporine -diltiazem -dronedarone -eltrombopag -erythromycin -felodipine -grapefruit juice -lopinavir -quercetin -quinidine -ranolazine -ritonavir -ticagrelor -verapamil This list may not describe all possible interactions. Give your health care provider a list of all the medicines, herbs, non-prescription drugs, or dietary supplements you use. Also tell them if you smoke, drink alcohol, or use illegal drugs. Some items may interact with your medicine. What should I watch for while using this medicine? This drug may make you feel generally unwell. This is not uncommon, as chemotherapy can affect healthy cells as well as cancer cells. Report any side effects. Continue your course of treatment even though you feel ill unless your doctor tells you to stop. Call your doctor or health care professional for advice if you get a fever, chills or sore throat, or other symptoms of a cold or flu. Do not treat yourself. This drug decreases your body's ability to fight infections. Try to avoid being around people who are sick. This medicine may increase your risk to bruise or bleed. Call your doctor or health care professional if you notice  any unusual bleeding. Be careful brushing and flossing your teeth or using a toothpick because you may get an infection or bleed more easily. If you have any dental work done, tell your dentist you are receiving this medicine. Avoid taking products that contain aspirin, acetaminophen, ibuprofen, naproxen, or ketoprofen unless instructed by your doctor. These medicines may hide a fever. Do not become pregnant while taking this medicine or within 1 month of stopping it. Women should inform their doctor if they wish to become pregnant or think they might be pregnant. There is a potential for serious side effects to an unborn child. Talk to your health care professional or pharmacist for more information. Do not breast-feed an infant while taking this medicine. Men must use a latex condom during sexual contact with a woman while taking this medicine and for 3 months after you stop taking this medicine. A latex condom is needed even if you have had a vasectomy. Contact your doctor right away if your partner becomes pregnant. Do not donate sperm while taking this medicine and for 3 months after you stop taking this medicine. What side effects may I notice from receiving this medicine? Side effects that you should report to your doctor or health care professional as soon as possible: -allergic reactions like skin rash, itching or hives, swelling of the face, lips, or tongue -breathing difficulties -diarrhea -dizziness -fever or chills, sore throat -mouth sores or pain -pain, tingling,  numbness in the hands or feet -unusual bleeding or bruising -unusually weak or tired -yellowing of the eyes or skin Side effects that usually do not require medical attention (report to your doctor or health care professional if they continue or are bothersome): -hair loss -headache -loss of appetite -nausea, vomiting -stomach pain This list may not describe all possible side effects. Call your doctor for medical advice  about side effects. You may report side effects to FDA at 1-800-FDA-1088. Where should I keep my medicine? Keep out of the reach of children. This drug is usually given in a hospital or clinic and will not be stored at home. In rare cases, this medicine may be given at home. If you are using this medicine at home, you will be instructed on how to store this medicine. Throw away any unused medicine after the expiration date on the label. NOTE: This sheet is a summary. It may not cover all possible information. If you have questions about this medicine, talk to your doctor, pharmacist, or health care provider.  2017 Elsevier/Gold Standard (2014-06-05 11:14:18)

## 2017-02-12 LAB — CA 125

## 2017-02-18 ENCOUNTER — Other Ambulatory Visit: Payer: Medicare Other

## 2017-02-18 ENCOUNTER — Ambulatory Visit: Payer: Medicare Other

## 2017-02-25 ENCOUNTER — Other Ambulatory Visit (HOSPITAL_BASED_OUTPATIENT_CLINIC_OR_DEPARTMENT_OTHER): Payer: Medicare Other

## 2017-02-25 ENCOUNTER — Ambulatory Visit: Payer: Medicare Other

## 2017-02-25 ENCOUNTER — Encounter: Payer: Self-pay | Admitting: Nurse Practitioner

## 2017-02-25 ENCOUNTER — Ambulatory Visit (HOSPITAL_BASED_OUTPATIENT_CLINIC_OR_DEPARTMENT_OTHER): Payer: Medicare Other | Admitting: Nurse Practitioner

## 2017-02-25 DIAGNOSIS — C569 Malignant neoplasm of unspecified ovary: Secondary | ICD-10-CM

## 2017-02-25 DIAGNOSIS — K0889 Other specified disorders of teeth and supporting structures: Secondary | ICD-10-CM

## 2017-02-25 LAB — COMPREHENSIVE METABOLIC PANEL
ALT: 18 U/L (ref 0–55)
ANION GAP: 8 meq/L (ref 3–11)
AST: 28 U/L (ref 5–34)
Albumin: 3.4 g/dL — ABNORMAL LOW (ref 3.5–5.0)
Alkaline Phosphatase: 120 U/L (ref 40–150)
BUN: 13.7 mg/dL (ref 7.0–26.0)
CHLORIDE: 107 meq/L (ref 98–109)
CO2: 25 mEq/L (ref 22–29)
CREATININE: 0.8 mg/dL (ref 0.6–1.1)
Calcium: 9.2 mg/dL (ref 8.4–10.4)
EGFR: 74 mL/min/{1.73_m2} — ABNORMAL LOW (ref >90)
Glucose: 99 mg/dl (ref 70–140)
POTASSIUM: 4.2 meq/L (ref 3.5–5.1)
Sodium: 140 mEq/L (ref 136–145)
Total Bilirubin: 0.49 mg/dL (ref 0.20–1.20)
Total Protein: 6.5 g/dL (ref 6.4–8.3)

## 2017-02-25 LAB — CBC WITH DIFFERENTIAL/PLATELET
BASO%: 0.4 % (ref 0.0–2.0)
Basophils Absolute: 0 10*3/uL (ref 0.0–0.1)
EOS%: 1.1 % (ref 0.0–7.0)
Eosinophils Absolute: 0 10*3/uL (ref 0.0–0.5)
HCT: 28 % — ABNORMAL LOW (ref 34.8–46.6)
HGB: 8.7 g/dL — ABNORMAL LOW (ref 11.6–15.9)
LYMPH%: 54.2 % — AB (ref 14.0–49.7)
MCH: 28 pg (ref 25.1–34.0)
MCHC: 31.1 g/dL — AB (ref 31.5–36.0)
MCV: 90 fL (ref 79.5–101.0)
MONO#: 0.3 10*3/uL (ref 0.1–0.9)
MONO%: 10.6 % (ref 0.0–14.0)
NEUT%: 33.7 % — ABNORMAL LOW (ref 38.4–76.8)
NEUTROS ABS: 0.9 10*3/uL — AB (ref 1.5–6.5)
Platelets: 59 10*3/uL — ABNORMAL LOW (ref 145–400)
RBC: 3.11 10*6/uL — ABNORMAL LOW (ref 3.70–5.45)
RDW: 20.8 % — ABNORMAL HIGH (ref 11.2–14.5)
WBC: 2.6 10*3/uL — AB (ref 3.9–10.3)
lymph#: 1.4 10*3/uL (ref 0.9–3.3)
nRBC: 0 % (ref 0–0)

## 2017-02-25 MED ORDER — CLINDAMYCIN HCL 300 MG PO CAPS: 300.0000 mg | ORAL_CAPSULE | Freq: Three times a day (TID) | ORAL | 0 refills | Status: DC

## 2017-02-25 NOTE — Progress Notes (Signed)
Per Dr. Benay Spice, no treatment today due to [facility] of 0.9. Retta Mac to infusion to assess patient due to complaints of slow-healing tooth extraction and lesions/swelling to corners of mouth. Education provided and patient voiced understanding.    Wylene Simmer, BSN, RN 02/25/2017 4:24 PM

## 2017-02-25 NOTE — Assessment & Plan Note (Signed)
Patient presented to the [facility] today to receive cycle 2, day 8 of the Topetecan chemotherapy.  She received Avastin on 02/11/2017.  Patient states that she went to [employer] on Monday, 02/21/2017 for a dental extraction.  She states that she has 3 sutures at the extraction site in the site is slowly improving.  However, she is concerned regarding the possibility of infection developing since her counts are low today.  She denies any drainage from the site.  Chills denies any recent fevers or chills.  On exam today, left lower back tooth extraction site appears to be healing well with sutures intact.  There is no excessive edema, erythema, or drainage noted.  The site is nontender.  Patient does have some mild swelling to the corners of her mouth.  The patient states is secondary to the dental work.  She just had done.  There are no other lesions in the mouth.  Patient's blood counts obtained today revealed a CBC.  2.6, ANC 0.9, hemoglobin 8.7, platelet count is 59.  Patient was afebrile today.  Patient states she's been prescribed clindamycin in the past for dental issues.  Will prescribe clindamycin 300 mg 3 times per day for a total of one week prophylactically secondary to patient's chemotherapy-induced neutropenia.  Also, will need to reschedule patient for labs, visit, and possible chemotherapy again next week since she missed chemotherapy this week.  Patient was advised to call [facility] dental school.  She has any worsening symptoms with her dental extraction site whatsoever.

## 2017-02-25 NOTE — Progress Notes (Signed)
SYMPTOM MANAGEMENT CLINIC    Chief Complaint: Neutropenia, dental extraction  HPI:  Cassandra Nichols 76 y.o. female diagnosed with ovarian cancer.  Currently undergoing Topetecan / avastin chemo regimen.     Ovarian cancer ([place])   11/17/2011 Initial Diagnosis    Ovarian cancer       Review of Systems  Constitutional: Positive for malaise/fatigue.  HENT:       Left lower back tooth.  Dental extraction site.  All other systems reviewed and are negative.   Past Medical History:  Diagnosis Date  . Cancer ([place])   . 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 Ovarian cancer ([employer]) 11/17/2011  . Shingles     Past Surgical History:  Procedure Laterality Date  . 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    is allergic to penicillins.  Allergies as of 02/25/2017      Reactions   Penicillins Rash      Medication List       Accurate as of 02/25/17  4:38 PM. Always use your most recent med list.          dexamethasone 4 MG tablet Commonly known as:  DECADRON Take 2 times a day with food the day before chemo, the day of chemo, and the day after chemo   docusate sodium 100 MG capsule Commonly known as:  COLACE Take 100 mg by mouth 2 (two) times daily.   fluticasone 50 MCG/ACT nasal spray Commonly known as:  FLONASE Place 1 spray into both nostrils as needed for allergies or rhinitis.   folic acid 1 MG tablet Commonly known as:  FOLVITE Take 1 tablet (1 mg total) by mouth daily.   hydrochlorothiazide 12.5 MG capsule Commonly known as:  MICROZIDE Take 1 capsule (12.5 mg total) by mouth daily.   lisinopril 20 MG tablet Commonly known as:  PRINIVIL,ZESTRIL Take 1 tablet (20 mg total) by mouth daily.   loratadine 10 MG tablet Commonly known as:  CLARITIN Take 10 mg by mouth daily as needed for allergies.   Melatonin 3 MG Tabs Take 3 mg by mouth  at bedtime as needed. Reported on 03/12/2016   prochlorperazine 10 MG tablet Commonly known as:  COMPAZINE Take 1 tablet (10 mg total) by mouth every 6 (six) hours as needed for nausea or vomiting.   vitamin B-12 1000 MCG tablet Commonly known as:  [employer] VITAMIN B12 Take 1 tablet (1,000 mcg total) by mouth daily.        PHYSICAL EXAMINATION  Oncology Vitals 02/11/2017 02/11/2017  Height - -  Weight - -  Weight (lbs) - -  BMI (kg/m2) - -  Temp - 98  Pulse 85 84  Resp - 20  Resp (Historical as of 07/20/12) - -  SpO2 - 100  BSA (m2) - -   BP Readings from Last 2 Encounters:  02/11/17 (!) 156/68  01/14/17 (!) 143/75    Physical Exam  Constitutional: She is oriented to person, place, and time and well-developed, well-nourished, and in no distress.  HENT:  Head: Normocephalic and atraumatic.  On exam today, left lower back tooth extraction site appears to be healing well with sutures intact.  There is no excessive edema, erythema, or drainage noted.  The site is nontender.  Patient does have some mild swelling to the corners of her mouth.  The patient states  is secondary to the dental work.  She just had done.  There are no other lesions in the mouth.       Eyes: Conjunctivae and EOM are normal. Pupils are equal, round, and reactive to light.  Neck: Normal range of motion.  Pulmonary/Chest: Effort normal. No respiratory distress.  Musculoskeletal: Normal range of motion.  Neurological: She is alert and oriented to person, place, and time.  Skin: Skin is warm and dry.  Psychiatric: Affect normal.  Nursing note and vitals reviewed.   LABORATORY DATA:. Appointment on 02/25/2017  Component Date Value Ref Range Status  . WBC 02/25/2017 2.6* 3.9 - 10.3 10e3/uL Final  . NEUT# 02/25/2017 0.9* 1.5 - 6.5 10e3/uL Final  . HGB 02/25/2017 8.7* 11.6 - 15.9 g/dL Final  . HCT 02/25/2017 28.0* 34.8 - 46.6 % Final  . Platelets 02/25/2017 59* 145 - 400 10e3/uL Final  . MCV 02/25/2017 90.0   79.5 - 101.0 fL Final  . MCH 02/25/2017 28.0  25.1 - 34.0 pg Final  . MCHC 02/25/2017 31.1* 31.5 - 36.0 g/dL Final  . RBC 02/25/2017 3.11* 3.70 - 5.45 10e6/uL Final  . RDW 02/25/2017 20.8* 11.2 - 14.5 % Final  . lymph# 02/25/2017 1.4  0.9 - 3.3 10e3/uL Final  . MONO# 02/25/2017 0.3  0.1 - 0.9 10e3/uL Final  . Eosinophils Absolute 02/25/2017 0.0  0.0 - 0.5 10e3/uL Final  . Basophils Absolute 02/25/2017 0.0  0.0 - 0.1 10e3/uL Final  . NEUT% 02/25/2017 33.7* 38.4 - 76.8 % Final  . LYMPH% 02/25/2017 54.2* 14.0 - 49.7 % Final  . MONO% 02/25/2017 10.6  0.0 - 14.0 % Final  . EOS% 02/25/2017 1.1  0.0 - 7.0 % Final  . BASO% 02/25/2017 0.4  0.0 - 2.0 % Final  . nRBC 02/25/2017 0  0 - 0 % Final  . Sodium 02/25/2017 140  136 - 145 mEq/L Final  . Potassium 02/25/2017 4.2  3.5 - 5.1 mEq/L Final  . Chloride 02/25/2017 107  98 - 109 mEq/L Final  . CO2 02/25/2017 25  22 - 29 mEq/L Final  . Glucose 02/25/2017 99  70 - 140 mg/dl Final  . BUN 02/25/2017 13.7  7.0 - 26.0 mg/dL Final  . Creatinine 02/25/2017 0.8  0.6 - 1.1 mg/dL Final  . Total Bilirubin 02/25/2017 0.49  0.20 - 1.20 mg/dL Final  . Alkaline Phosphatase 02/25/2017 120  40 - 150 U/L Final  . AST 02/25/2017 28  5 - 34 U/L Final  . ALT 02/25/2017 18  0 - 55 U/L Final  . Total Protein 02/25/2017 6.5  6.4 - 8.3 g/dL Final  . Albumin 02/25/2017 3.4* 3.5 - 5.0 g/dL Final  . Calcium 02/25/2017 9.2  8.4 - 10.4 mg/dL Final  . Anion Gap 02/25/2017 8  3 - 11 mEq/L Final  . EGFR 02/25/2017 74* >90 ml/min/1.73 m2 Final    RADIOGRAPHIC STUDIES: No results found.  ASSESSMENT/PLAN:    Ovarian cancer [facility]) Patient presented to the [facility] today to receive cycle 2, day 8 of the Topetecan chemotherapy.  She received Avastin on 02/11/2017.  Patient states that she went to [employer] school on Monday, 02/21/2017 for a dental extraction.  She states that she has 3 sutures at the extraction site in the site is slowly improving.  However, she is  concerned regarding the possibility of infection developing since her counts are low today.  She denies any drainage from the site.  Chills denies any recent fevers or chills.  On exam today, left lower back tooth extraction site appears to be healing well with sutures intact.  There is no excessive edema, erythema, or drainage noted.  The site is nontender.  Patient does have some mild swelling to the corners of her mouth.  The patient states is secondary to the dental work.  She just had done.  There are no other lesions in the mouth.  Patient's blood counts obtained today revealed a CBC.  2.6, ANC 0.9, hemoglobin 8.7, platelet count is 59.  Patient was afebrile today.  Patient states she's been prescribed clindamycin in the past for dental issues.  Will prescribe clindamycin 300 mg 3 times per day for a total of one week prophylactically secondary to patient's chemotherapy-induced neutropenia.  Also, will need to reschedule patient for labs, visit, and possible chemotherapy again next week since she missed chemotherapy this week.  Patient was advised to call [facility] dental school.  She has any worsening symptoms with her dental extraction site whatsoever.     Patient stated understanding of all instructions; and was in agreement with this plan of care. The patient knows to call the clinic with any problems, questions or concerns.   Total time spent with patient was 15 minutes;  with greater than 75 percent of that time spent in face to face counseling regarding patient's symptoms,  and coordination of care and follow up.  Disclaimer:This dictation was prepared with Dragon/digital dictation along with [employer]. Any transcriptional errors that result from this process are unintentional.  Drue Second, NP 02/25/2017

## 2017-02-28 ENCOUNTER — Telehealth: Payer: Self-pay | Admitting: Oncology

## 2017-02-28 NOTE — Telephone Encounter (Signed)
lvm to inform pt of r/s appt 3/16 at 2 pm per LOS

## 2017-03-03 ENCOUNTER — Telehealth: Payer: Self-pay | Admitting: Oncology

## 2017-03-03 NOTE — Telephone Encounter (Signed)
Called and left message for patient about schedule change from 3/16 to 3/23 per patient request and 3/14 sch msg.

## 2017-03-04 ENCOUNTER — Ambulatory Visit: Payer: Medicare Other

## 2017-03-04 ENCOUNTER — Other Ambulatory Visit: Payer: Medicare Other

## 2017-03-04 ENCOUNTER — Ambulatory Visit: Payer: Medicare Other | Admitting: Adult Health

## 2017-03-10 ENCOUNTER — Other Ambulatory Visit: Payer: Self-pay | Admitting: Oncology

## 2017-03-11 ENCOUNTER — Other Ambulatory Visit (HOSPITAL_BASED_OUTPATIENT_CLINIC_OR_DEPARTMENT_OTHER): Payer: Medicare Other

## 2017-03-11 ENCOUNTER — Ambulatory Visit (HOSPITAL_BASED_OUTPATIENT_CLINIC_OR_DEPARTMENT_OTHER): Payer: Medicare Other | Admitting: Oncology

## 2017-03-11 ENCOUNTER — Ambulatory Visit (HOSPITAL_BASED_OUTPATIENT_CLINIC_OR_DEPARTMENT_OTHER): Payer: Medicare Other

## 2017-03-11 ENCOUNTER — Ambulatory Visit: Payer: Medicare Other

## 2017-03-11 ENCOUNTER — Other Ambulatory Visit: Payer: Medicare Other

## 2017-03-11 VITALS — BP 186/78 | HR 84 | Temp 98.0°F | Resp 17

## 2017-03-11 DIAGNOSIS — C772 Secondary and unspecified malignant neoplasm of intra-abdominal lymph nodes: Secondary | ICD-10-CM | POA: Diagnosis not present

## 2017-03-11 DIAGNOSIS — I10 Essential (primary) hypertension: Secondary | ICD-10-CM

## 2017-03-11 DIAGNOSIS — D649 Anemia, unspecified: Secondary | ICD-10-CM

## 2017-03-11 DIAGNOSIS — C569 Malignant neoplasm of unspecified ovary: Secondary | ICD-10-CM

## 2017-03-11 DIAGNOSIS — C787 Secondary malignant neoplasm of liver and intrahepatic bile duct: Secondary | ICD-10-CM

## 2017-03-11 DIAGNOSIS — Z5112 Encounter for antineoplastic immunotherapy: Secondary | ICD-10-CM | POA: Diagnosis not present

## 2017-03-11 DIAGNOSIS — Z5111 Encounter for antineoplastic chemotherapy: Secondary | ICD-10-CM | POA: Diagnosis not present

## 2017-03-11 LAB — COMPREHENSIVE METABOLIC PANEL
ALBUMIN: 3.8 g/dL (ref 3.5–5.0)
ALK PHOS: 102 U/L (ref 40–150)
ALT: 14 U/L (ref 0–55)
ANION GAP: 10 meq/L (ref 3–11)
AST: 27 U/L (ref 5–34)
BUN: 15.7 mg/dL (ref 7.0–26.0)
CALCIUM: 10 mg/dL (ref 8.4–10.4)
CHLORIDE: 107 meq/L (ref 98–109)
CO2: 25 mEq/L (ref 22–29)
CREATININE: 0.8 mg/dL (ref 0.6–1.1)
EGFR: 68 mL/min/{1.73_m2} — ABNORMAL LOW (ref >90)
Glucose: 91 mg/dl (ref 70–140)
POTASSIUM: 4.4 meq/L (ref 3.5–5.1)
Sodium: 142 mEq/L (ref 136–145)
Total Bilirubin: 0.45 mg/dL (ref 0.20–1.20)
Total Protein: 7.2 g/dL (ref 6.4–8.3)

## 2017-03-11 LAB — CBC WITH DIFFERENTIAL/PLATELET
BASO%: 0.6 % (ref 0.0–2.0)
BASOS ABS: 0 10*3/uL (ref 0.0–0.1)
EOS%: 0.2 % (ref 0.0–7.0)
Eosinophils Absolute: 0 10*3/uL (ref 0.0–0.5)
HEMATOCRIT: 31.8 % — AB (ref 34.8–46.6)
HEMOGLOBIN: 9.8 g/dL — AB (ref 11.6–15.9)
LYMPH#: 1.7 10*3/uL (ref 0.9–3.3)
LYMPH%: 31.4 % (ref 14.0–49.7)
MCH: 28 pg (ref 25.1–34.0)
MCHC: 30.8 g/dL — AB (ref 31.5–36.0)
MCV: 90.9 fL (ref 79.5–101.0)
MONO#: 0.6 10*3/uL (ref 0.1–0.9)
MONO%: 10.6 % (ref 0.0–14.0)
NEUT#: 3 10*3/uL (ref 1.5–6.5)
NEUT%: 57.2 % (ref 38.4–76.8)
Platelets: 153 10*3/uL (ref 145–400)
RBC: 3.5 10*6/uL — ABNORMAL LOW (ref 3.70–5.45)
RDW: 21 % — AB (ref 11.2–14.5)
WBC: 5.3 10*3/uL (ref 3.9–10.3)

## 2017-03-11 MED ORDER — SODIUM CHLORIDE 0.9% FLUSH
10.0000 mL | INTRAVENOUS | Status: DC | PRN
  Administered 2017-03-11: 10 mL
  Filled 2017-03-11: qty 10

## 2017-03-11 MED ORDER — SODIUM CHLORIDE 0.9 % IV SOLN
Freq: Once | INTRAVENOUS | Status: AC
  Administered 2017-03-11: 16:00:00 via INTRAVENOUS

## 2017-03-11 MED ORDER — SODIUM CHLORIDE 0.9 % IV SOLN
500.0000 mg | Freq: Once | INTRAVENOUS | Status: AC
  Administered 2017-03-11: 500 mg via INTRAVENOUS
  Filled 2017-03-11: qty 20

## 2017-03-11 MED ORDER — HYDROCHLOROTHIAZIDE 12.5 MG PO CAPS: 25.0000 mg | ORAL_CAPSULE | Freq: Every day | ORAL | 6 refills | Status: DC

## 2017-03-11 MED ORDER — PROCHLORPERAZINE MALEATE 10 MG PO TABS
10.0000 mg | ORAL_TABLET | Freq: Once | ORAL | Status: AC
  Administered 2017-03-11: 10 mg via ORAL

## 2017-03-11 MED ORDER — PROCHLORPERAZINE MALEATE 10 MG PO TABS
ORAL_TABLET | ORAL | Status: AC
  Filled 2017-03-11: qty 1

## 2017-03-11 MED ORDER — LISINOPRIL 20 MG PO TABS: 20.0000 mg | ORAL_TABLET | Freq: Every day | ORAL | 0 refills | Status: DC

## 2017-03-11 MED ORDER — TOPOTECAN HCL CHEMO INJECTION 4 MG
4.0000 mg/m2 | Freq: Once | INTRAVENOUS | Status: AC
  Administered 2017-03-11: 6 mg via INTRAVENOUS
  Filled 2017-03-11: qty 6

## 2017-03-11 MED ORDER — HEPARIN SOD (PORK) LOCK FLUSH 100 UNIT/ML IV SOLN
500.0000 [IU] | Freq: Once | INTRAVENOUS | Status: AC | PRN
  Administered 2017-03-11: 500 [IU]
  Filled 2017-03-11: qty 5

## 2017-03-11 NOTE — Progress Notes (Signed)
ID: Cassandra Nichols   DOB: [birth year]  MR#: [id number]  NWG#:[id number]   PCP:  Marjorie Smolder, MD GYN:  [name], MD OTHER:  [name], MD  CHIEF COMPLAINT:  Recurrent Ovarian Cancer  CURRENT TREATMENT:  Topotecan, bevacizumab  NOTE: THIS PATIENT REQUESTS THAT ABSOLUTELY NO NUMERIC INFORMATION BE GIVEN REGARDING ANY LAB AND PARTICULARLY DOES NOT WANT TO KNOW SPECIFIC CA-125 RESULTS  INTERVAL HISTORY: Cassandra Nichols returns today for follow-up of her ovarian cancer. She is being treated currently with topotecan and bevacizumab. The intent was for her to be treated on days 1 and 8 of each 21 day cycle, but there have been many issues related to her work, transportation, finances, and family, and the result is that she received 2 doses in January, 14 days apart, then one treatment in February and month later, and now a fourth dose a month after the third dose.  As far as side effects are concerned she tolerates the treatment well, and she has had no problems with mouth sores, nausea, vomiting, or peripheral neuropathy. She continues to work irregularly as before.  REVIEW OF SYSTEMS: Cassandra Nichols tells me there are wonderful things going on with her family, particularly the grandchildren. She did not have time to tell me all about it today, she says. She does feel fatigued. She has no pain, no fever, no rash, no bleeding, and no problems with bowel or bladder habits, which are unchanged and normal for her. There's been no abdominal bloating cramping pain or masses. A detailed review of systems today was otherwise stable   HISTORY OF PRESENT ILLNESS: From the earlier summary note:   Cassandra Nichols was initially diagnosed with ovarian cancer in July of 2002. She underwent debulking surgery at that time for what proved to be a 4 of 18 lymph nodes positive disease. She was initially treated with 6 cycles of paclitaxel and carboplatin adjuvantly, completed in December of 2002.  Patient has had multiple recurrences  since December of 2002, and has been treated with multiple agents, as detailed below.   PAST MEDICAL HISTORY: Past Medical History:  Diagnosis Date  . Cancer ([place])   . 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 Ovarian cancer ([place]) 11/17/2011  . Shingles     PAST SURGICAL HISTORY: Past Surgical History:  Procedure Laterality Date  . GANGLION CYST EXCISION Left    Wrist    FAMILY HISTORY Family History  Problem Relation Age of Onset  . Hypertension Mother   . GER disease Mother   . Cancer Father     Cancer (stomach)   the patient's father died at the age of 62, from cancer that may have it started in his abdomen. He had 8 siblings including 2 sisters with breast cancer. The patient's mother had by Dr. cancer, but died from unrelated causes at the age of 43. The patient's maternal grandmother may have had ovarian cancer. The patient is an only child   GYNECOLOGIC HISTORY: Menarche age 39, first live birth age 41. The patient is GX P1. She went through the change of life around age 14. She subsequently had a TAH/BSO was part of her ovarian cancer treatment.  SOCIAL HISTORY: (Updated 03/08/2014) Cassandra Nichols lives alone. She works temporary jobs, primarily Nutritional therapist.  Her (adopted) daughter Cassandra Nichols lives [place] and is Mudlogger of the epidemiology and [place] there; she is married with 2 children. Son  Cassandra Nichols lives in [place], [place]. He has 4 children of his own.  He is Tree surgeon for a [employer]. His wife is a Marine scientist   ADVANCED DIRECTIVES: At the 01/16/2016 visit Cassandra Nichols tells me that her son Cassandra Nichols is her healthcare power of attorney. He can be reached at [phone]. [Her daughter Cassandra Nichols is her financial power of attorney.]  HEALTH MAINTENANCE:  (Updated  03/08/2014) Social History  Substance Use Topics  . Smoking status: Former Research scientist (life sciences)  . Smokeless tobacco: Never Used  . Alcohol use No     Comment: Occasionally     Colonoscopy:   PAP: s/p hysterectomy  BSO  Bone density:   Mammogram:  09/16/2015   Allergies  Allergen Reactions  . Penicillins Rash    Current Outpatient Prescriptions  Medication Sig Dispense Refill  . dexamethasone (DECADRON) 4 MG tablet Take 2 times a day with food the day before chemo, the day of chemo, and the day after chemo 30 tablet 4  . docusate sodium (COLACE) 100 MG capsule Take 100 mg by mouth 2 (two) times daily.    . fluticasone (FLONASE) 50 MCG/ACT nasal spray Place 1 spray into both nostrils as needed for allergies or rhinitis.    . folic acid (FOLVITE) 1 MG tablet Take 1 tablet (1 mg total) by mouth daily. 90 tablet 4  . hydrochlorothiazide (MICROZIDE) 12.5 MG capsule Take 2 capsules (25 mg total) by mouth daily. 30 capsule 6  . lisinopril (PRINIVIL,ZESTRIL) 20 MG tablet Take 1 tablet (20 mg total) by mouth daily. 90 tablet 0  . loratadine (CLARITIN) 10 MG tablet Take 10 mg by mouth daily as needed for allergies.    . Melatonin 3 MG TABS Take 3 mg by mouth at bedtime as needed. Reported on 03/12/2016    . prochlorperazine (COMPAZINE) 10 MG tablet Take 1 tablet (10 mg total) by mouth every 6 (six) hours as needed for nausea or vomiting. 30 tablet 6  . vitamin B-12 (CYANOCOBALAMIN) 1000 MCG tablet Take 1 tablet (1,000 mcg total) by mouth daily. 90 tablet 4   No current facility-administered medications for this visit.     OBJECTIVE: Middle-aged white woman In no acute distress There were no vitals filed for this visit.   There is no height or weight on file to calculate BMI.    ECOG FS: 1 There were no vitals filed for this visit.   Temperature on 03/11/2017 was 97.6, pulse 89, respiratory rate 16, and blood pressure was 145/85, which is high for her. Room air saturation was 98%. Her weight is slightly up at 118 pounds.  Sclerae unicteric, pupils round and equal Oropharynx clear and moist No cervical or supraclavicular adenopathy Lungs no rales or rhonchi Heart regular rate and rhythm Abd soft,  nontender, positive bowel sounds, no masses palpated MSK no focal spinal tenderness, no upper extremity lymphedema Neuro: nonfocal, well oriented, appropriate affect Breasts: Deferred   LAB RESULTS:   Lab Results  Component Value Date   WBC 5.3 03/11/2017   NEUTROABS 3.0 03/11/2017   HGB 9.8 (L) 03/11/2017   HCT 31.8 (L) 03/11/2017   MCV 90.9 03/11/2017   PLT 153 03/11/2017        Chemistry      Component Value Date/Time   NA 142 03/11/2017 1456   K 4.4 03/11/2017 1456   CL 106 09/29/2016 1343   CL 106 05/09/2013 1304   CO2 25 03/11/2017 1456   BUN 15.7 03/11/2017 1456   CREATININE 0.8 03/11/2017 1456      Component Value Date/Time  CALCIUM 10.0 03/11/2017 1456   ALKPHOS 102 03/11/2017 1456   AST 27 03/11/2017 1456   ALT 14 03/11/2017 1456   BILITOT 0.45 03/11/2017 1456      Ref Range & Units 4wk ago 2mo ago 61mo ago 76mo ago    Cancer Antigen (CA) 125 0.0 - 38.1 U/mL 8,321.0   8,106.0CM   9,329.0CM   10,661.0CM    Comments: Results confirmed on     STUDIES: No results found.   ASSESSMENT: 76 y.o.  [place] woman (1)  with a history of ovarian cancer dating back to July 2002 when she underwent debulking surgery for what proved to be 4/18 lymph node positive disease.   (2)  She received 6 cycles of paclitaxel/carboplatin adjuvantly completed in December 2002.   (3)  She had her first recurrence in December 2005 treated with paclitaxel;   (4)  second recurrence May 2007 treated with [place] and paclitaxel;   (5)  third recurrence January 2010 treated with topotecan and Gemzar;   (6)  fourth recurrence January 2012 treated with single-agent carboplatin (January to August 2012) with initial response, then plateau; with progression January 2013 (less than 6 months after last carbo dose) treated with single-agent gemzar given Q2 weeks (January to April 2013), with evidence of progression  (7) Doxil strartedJuly 2013 6, with evidence of initial response, and fair  tolerance. Held between May and August 2014 due to patient's work schedule and scheduling issues.  Resumed treatment as of 08/24/2013, with some delays secondary to the patient's schedule. As of early December 2014, her total cumulative dose of Doxil reached 560 mg per meter square. In addition there was also an upward trend of the CA 125. Doxil was discontinued (last dose 10/30/2013). Echocardiogram on 11/14/2013 showed a well preserved ejection fraction of 55-60%.  (8)  treated with single agent paclitaxel, given on days one and 8 of each 21 day cycle, first dose on 12/27/2013,with continuing response, but mild to moderate toxicity; 11th and last dose 04/12/2014   (9) carboplatin and cyclophosphamide started on 08/28/14 given every 21 days-- stopped 10/10/2014 after a reaction to carboplatin (also no evidence of response)  (10) cyclophosphamide and bevacizumab started 12/02/2014, stopped 12/26/2015 with progression  (a) CT scan of the abdomen and pelvis 06/19/2015 shows stable disease  (b) CT of the chest/abd/pelvis 09/12/2015 stable  (c) PET scan 01/15/2016 shows progression in the liver and upper abdominal nodes  (11) abraxane started 01/16/2016,  repeated every 14 days with evidence of response  (a) last dose 06/25/2016, discontinued because of neuropathy  (12) offered genetics testing at the 01/16/2016 visit, but declined  (13) started pemetrexed 08/27/2016  (a) cycle 2 delayed 2 weeks because of the patient's travel schedule   (b) stopped after cycle 5, given 12/03/2016, because of a continuing rise in the CA-125  (14) started topotecan 12/24/2016 given every 14 days with bevacizumab given every 14 days  (a) third dose (cycle 2 dose 1) omitted because of patient's travel  (b) fourth dose delayed 2 weeks for similar reasons    PLAN:  Milca Sytsma is tolerating her treatment well and the rise in her CA 125 has plateaued and is actually down from pre-treatment levels. I am hopeful if we  continue along this line we will maintain disease control with tolerable toxicities.  Her anemia is moderate. At this point I don't believe she needs Aranesp, but this is something to consider if the hemoglobin drops steadily below 9.  Her blood pressure  is up, doubtless because of the bevacizumab. She has not been taking her blood pressure medication, partly for financial reasons and partly because she was not sure she needed it. Today we reviewed that. She will be on lisinopril 20 mg daily and hydrochlorothiazide 25 mg daily. We will repeat her blood pressure at the next visit.  She is now 16 years out from her diagnosis of ovarian cancer and 13 years out from her first recurrence. This is remarkable, but it will not be sustainable at some point. Right now continuing topotecan seems to be the best fit for her and we will try to give it every 2 weeks at a time of day that does not interfere with her work. We have adjusted the bevacizumab dose accordingly  She knows to call for any other problems that may develop before her return   Chauncey Cruel, MD    03/12/2017

## 2017-03-11 NOTE — Progress Notes (Signed)
Alerted MD of patient BP. Per MD ok to proceed with treatment.   Wylene Simmer, BSN, RN 03/11/2017 4:19 PM

## 2017-03-12 LAB — CA 125

## 2017-03-23 ENCOUNTER — Other Ambulatory Visit: Payer: Self-pay | Admitting: Oncology

## 2017-03-25 ENCOUNTER — Other Ambulatory Visit (HOSPITAL_BASED_OUTPATIENT_CLINIC_OR_DEPARTMENT_OTHER): Payer: Medicare Other

## 2017-03-25 ENCOUNTER — Ambulatory Visit: Payer: Medicare Other

## 2017-03-25 ENCOUNTER — Other Ambulatory Visit: Payer: Self-pay | Admitting: Oncology

## 2017-03-25 ENCOUNTER — Other Ambulatory Visit: Payer: Medicare Other

## 2017-03-25 ENCOUNTER — Ambulatory Visit (HOSPITAL_BASED_OUTPATIENT_CLINIC_OR_DEPARTMENT_OTHER): Payer: Medicare Other

## 2017-03-25 VITALS — BP 141/67 | HR 82 | Temp 98.2°F | Resp 16

## 2017-03-25 DIAGNOSIS — Z5111 Encounter for antineoplastic chemotherapy: Secondary | ICD-10-CM | POA: Diagnosis not present

## 2017-03-25 DIAGNOSIS — C569 Malignant neoplasm of unspecified ovary: Secondary | ICD-10-CM

## 2017-03-25 DIAGNOSIS — Z5112 Encounter for antineoplastic immunotherapy: Secondary | ICD-10-CM

## 2017-03-25 DIAGNOSIS — C772 Secondary and unspecified malignant neoplasm of intra-abdominal lymph nodes: Secondary | ICD-10-CM

## 2017-03-25 DIAGNOSIS — C787 Secondary malignant neoplasm of liver and intrahepatic bile duct: Secondary | ICD-10-CM

## 2017-03-25 LAB — COMPREHENSIVE METABOLIC PANEL
ALBUMIN: 3.9 g/dL (ref 3.5–5.0)
ALK PHOS: 94 U/L (ref 40–150)
ALT: 14 U/L (ref 0–55)
AST: 25 U/L (ref 5–34)
Anion Gap: 10 mEq/L (ref 3–11)
BUN: 23.2 mg/dL (ref 7.0–26.0)
CALCIUM: 9.7 mg/dL (ref 8.4–10.4)
CHLORIDE: 106 meq/L (ref 98–109)
CO2: 25 mEq/L (ref 22–29)
CREATININE: 0.9 mg/dL (ref 0.6–1.1)
EGFR: 63 mL/min/{1.73_m2} — ABNORMAL LOW (ref >90)
GLUCOSE: 85 mg/dL (ref 70–140)
Potassium: 4.7 mEq/L (ref 3.5–5.1)
Sodium: 141 mEq/L (ref 136–145)
Total Bilirubin: 0.44 mg/dL (ref 0.20–1.20)
Total Protein: 7.2 g/dL (ref 6.4–8.3)

## 2017-03-25 LAB — CBC WITH DIFFERENTIAL/PLATELET
BASO%: 0.8 % (ref 0.0–2.0)
Basophils Absolute: 0 10*3/uL (ref 0.0–0.1)
EOS%: 0.4 % (ref 0.0–7.0)
Eosinophils Absolute: 0 10*3/uL (ref 0.0–0.5)
HEMATOCRIT: 30.9 % — AB (ref 34.8–46.6)
HEMOGLOBIN: 9.9 g/dL — AB (ref 11.6–15.9)
LYMPH#: 1.9 10*3/uL (ref 0.9–3.3)
LYMPH%: 40 % (ref 14.0–49.7)
MCH: 27.9 pg (ref 25.1–34.0)
MCHC: 31.9 g/dL (ref 31.5–36.0)
MCV: 87.5 fL (ref 79.5–101.0)
MONO#: 0.5 10*3/uL (ref 0.1–0.9)
MONO%: 10.1 % (ref 0.0–14.0)
NEUT#: 2.3 10*3/uL (ref 1.5–6.5)
NEUT%: 48.7 % (ref 38.4–76.8)
Platelets: 116 10*3/uL — ABNORMAL LOW (ref 145–400)
RBC: 3.54 10*6/uL — ABNORMAL LOW (ref 3.70–5.45)
RDW: 22 % — ABNORMAL HIGH (ref 11.2–14.5)
WBC: 4.8 10*3/uL (ref 3.9–10.3)

## 2017-03-25 MED ORDER — PROCHLORPERAZINE MALEATE 10 MG PO TABS
10.0000 mg | ORAL_TABLET | Freq: Once | ORAL | Status: AC
Start: 2017-03-25 — End: 2017-03-25
  Administered 2017-03-25: 10 mg via ORAL

## 2017-03-25 MED ORDER — SODIUM CHLORIDE 0.9 % IV SOLN
Freq: Once | INTRAVENOUS | Status: AC
  Administered 2017-03-25: 16:00:00 via INTRAVENOUS

## 2017-03-25 MED ORDER — HEPARIN SOD (PORK) LOCK FLUSH 100 UNIT/ML IV SOLN
500.0000 [IU] | Freq: Once | INTRAVENOUS | Status: AC | PRN
  Administered 2017-03-25: 500 [IU]
  Filled 2017-03-25: qty 5

## 2017-03-25 MED ORDER — PROCHLORPERAZINE MALEATE 10 MG PO TABS
ORAL_TABLET | ORAL | Status: AC
  Filled 2017-03-25: qty 1

## 2017-03-25 MED ORDER — SODIUM CHLORIDE 0.9 % IV SOLN
9.5000 mg/kg | Freq: Once | INTRAVENOUS | Status: AC
  Administered 2017-03-25: 500 mg via INTRAVENOUS
  Filled 2017-03-25: qty 16

## 2017-03-25 MED ORDER — TOPOTECAN HCL CHEMO INJECTION 4 MG
4.0000 mg/m2 | Freq: Once | INTRAVENOUS | Status: AC
  Administered 2017-03-25: 6 mg via INTRAVENOUS
  Filled 2017-03-25: qty 6

## 2017-03-25 MED ORDER — SODIUM CHLORIDE 0.9% FLUSH
10.0000 mL | INTRAVENOUS | Status: DC | PRN
  Administered 2017-03-25: 10 mL
  Filled 2017-03-25: qty 10

## 2017-03-25 NOTE — Patient Instructions (Signed)
[facility] Discharge Instructions for Patients Receiving Chemotherapy  Today you received the following chemotherapy agents:  Topotecan and Avastin.  To help prevent nausea and vomiting after your treatment, we encourage you to take your nausea medication as directed.   If you develop nausea and vomiting that is not controlled by your nausea medication, call the clinic.   BELOW ARE SYMPTOMS THAT SHOULD BE REPORTED IMMEDIATELY:  *FEVER GREATER THAN 100.5 F  *CHILLS WITH OR WITHOUT FEVER  NAUSEA AND VOMITING THAT IS NOT CONTROLLED WITH YOUR NAUSEA MEDICATION  *UNUSUAL SHORTNESS OF BREATH  *UNUSUAL BRUISING OR BLEEDING  TENDERNESS IN MOUTH AND THROAT WITH OR WITHOUT PRESENCE OF ULCERS  *URINARY PROBLEMS  *BOWEL PROBLEMS  UNUSUAL RASH Items with * indicate a potential emergency and should be followed up as soon as possible.  Feel free to call the clinic you have any questions or concerns. The clinic phone number is [phone].  Please show the [place] [facility] at check-in to the Emergency Department and triage nurse.

## 2017-04-01 ENCOUNTER — Other Ambulatory Visit: Payer: Medicare Other

## 2017-04-01 ENCOUNTER — Ambulatory Visit: Payer: Medicare Other

## 2017-04-08 ENCOUNTER — Other Ambulatory Visit: Payer: Self-pay | Admitting: Oncology

## 2017-04-08 ENCOUNTER — Other Ambulatory Visit (HOSPITAL_BASED_OUTPATIENT_CLINIC_OR_DEPARTMENT_OTHER): Payer: Medicare Other

## 2017-04-08 ENCOUNTER — Ambulatory Visit (HOSPITAL_BASED_OUTPATIENT_CLINIC_OR_DEPARTMENT_OTHER): Payer: Medicare Other

## 2017-04-08 VITALS — BP 139/80 | HR 86 | Temp 97.7°F | Resp 18

## 2017-04-08 DIAGNOSIS — C569 Malignant neoplasm of unspecified ovary: Secondary | ICD-10-CM

## 2017-04-08 DIAGNOSIS — C787 Secondary malignant neoplasm of liver and intrahepatic bile duct: Secondary | ICD-10-CM

## 2017-04-08 DIAGNOSIS — Z5112 Encounter for antineoplastic immunotherapy: Secondary | ICD-10-CM | POA: Diagnosis not present

## 2017-04-08 DIAGNOSIS — Z5111 Encounter for antineoplastic chemotherapy: Secondary | ICD-10-CM

## 2017-04-08 DIAGNOSIS — C772 Secondary and unspecified malignant neoplasm of intra-abdominal lymph nodes: Secondary | ICD-10-CM | POA: Diagnosis not present

## 2017-04-08 LAB — CBC WITH DIFFERENTIAL/PLATELET
BASO%: 0.5 % (ref 0.0–2.0)
Basophils Absolute: 0 10*3/uL (ref 0.0–0.1)
EOS%: 0.7 % (ref 0.0–7.0)
Eosinophils Absolute: 0 10*3/uL (ref 0.0–0.5)
HCT: 28.2 % — ABNORMAL LOW (ref 34.8–46.6)
HEMOGLOBIN: 9.2 g/dL — AB (ref 11.6–15.9)
LYMPH%: 47 % (ref 14.0–49.7)
MCH: 28.5 pg (ref 25.1–34.0)
MCHC: 32.5 g/dL (ref 31.5–36.0)
MCV: 87.9 fL (ref 79.5–101.0)
MONO#: 0.4 10*3/uL (ref 0.1–0.9)
MONO%: 11.1 % (ref 0.0–14.0)
NEUT%: 40.7 % (ref 38.4–76.8)
NEUTROS ABS: 1.4 10*3/uL — AB (ref 1.5–6.5)
Platelets: 97 10*3/uL — ABNORMAL LOW (ref 145–400)
RBC: 3.21 10*6/uL — AB (ref 3.70–5.45)
RDW: 22.3 % — ABNORMAL HIGH (ref 11.2–14.5)
WBC: 3.3 10*3/uL — AB (ref 3.9–10.3)
lymph#: 1.6 10*3/uL (ref 0.9–3.3)

## 2017-04-08 LAB — COMPREHENSIVE METABOLIC PANEL
ALT: 15 U/L (ref 0–55)
AST: 26 U/L (ref 5–34)
Albumin: 3.7 g/dL (ref 3.5–5.0)
Alkaline Phosphatase: 97 U/L (ref 40–150)
Anion Gap: 12 mEq/L — ABNORMAL HIGH (ref 3–11)
BILIRUBIN TOTAL: 0.42 mg/dL (ref 0.20–1.20)
BUN: 25.2 mg/dL (ref 7.0–26.0)
CO2: 24 meq/L (ref 22–29)
CREATININE: 1.1 mg/dL (ref 0.6–1.1)
Calcium: 9.5 mg/dL (ref 8.4–10.4)
Chloride: 106 mEq/L (ref 98–109)
EGFR: 51 mL/min/{1.73_m2} — ABNORMAL LOW (ref >90)
Glucose: 105 mg/dl (ref 70–140)
Potassium: 4.2 mEq/L (ref 3.5–5.1)
Sodium: 142 mEq/L (ref 136–145)
TOTAL PROTEIN: 7 g/dL (ref 6.4–8.3)

## 2017-04-08 LAB — UA PROTEIN, DIPSTICK - CHCC: Protein, ur: NEGATIVE mg/dL

## 2017-04-08 MED ORDER — SODIUM CHLORIDE 0.9% FLUSH
10.0000 mL | INTRAVENOUS | Status: DC | PRN
  Administered 2017-04-08: 10 mL
  Filled 2017-04-08: qty 10

## 2017-04-08 MED ORDER — PROCHLORPERAZINE MALEATE 10 MG PO TABS
10.0000 mg | ORAL_TABLET | Freq: Once | ORAL | Status: AC
  Administered 2017-04-08: 10 mg via ORAL

## 2017-04-08 MED ORDER — SODIUM CHLORIDE 0.9 % IV SOLN
9.5000 mg/kg | Freq: Once | INTRAVENOUS | Status: AC
  Administered 2017-04-08: 500 mg via INTRAVENOUS
  Filled 2017-04-08: qty 16

## 2017-04-08 MED ORDER — SODIUM CHLORIDE 0.9 % IV SOLN
Freq: Once | INTRAVENOUS | Status: AC
  Administered 2017-04-08: 16:00:00 via INTRAVENOUS

## 2017-04-08 MED ORDER — HEPARIN SOD (PORK) LOCK FLUSH 100 UNIT/ML IV SOLN
500.0000 [IU] | Freq: Once | INTRAVENOUS | Status: AC | PRN
  Administered 2017-04-08: 500 [IU]
  Filled 2017-04-08: qty 5

## 2017-04-08 MED ORDER — TOPOTECAN HCL CHEMO INJECTION 4 MG
4.0000 mg/m2 | Freq: Once | INTRAVENOUS | Status: AC
  Administered 2017-04-08: 6 mg via INTRAVENOUS
  Filled 2017-04-08: qty 6

## 2017-04-08 MED ORDER — SODIUM CHLORIDE 0.9 % IV SOLN: 10.0000 mg/kg | Freq: Once | INTRAVENOUS | Status: DC

## 2017-04-08 MED ORDER — PROCHLORPERAZINE MALEATE 10 MG PO TABS
ORAL_TABLET | ORAL | Status: AC
  Filled 2017-04-08: qty 1

## 2017-04-08 NOTE — Patient Instructions (Signed)
[facility] Discharge Instructions for Patients Receiving Chemotherapy  Today you received the following chemotherapy agents:  Topotecan and Avastin.  To help prevent nausea and vomiting after your treatment, we encourage you to take your nausea medication as directed.   If you develop nausea and vomiting that is not controlled by your nausea medication, call the clinic.   BELOW ARE SYMPTOMS THAT SHOULD BE REPORTED IMMEDIATELY:  *FEVER GREATER THAN 100.5 F  *CHILLS WITH OR WITHOUT FEVER  NAUSEA AND VOMITING THAT IS NOT CONTROLLED WITH YOUR NAUSEA MEDICATION  *UNUSUAL SHORTNESS OF BREATH  *UNUSUAL BRUISING OR BLEEDING  TENDERNESS IN MOUTH AND THROAT WITH OR WITHOUT PRESENCE OF ULCERS  *URINARY PROBLEMS  *BOWEL PROBLEMS  UNUSUAL RASH Items with * indicate a potential emergency and should be followed up as soon as possible.  Feel free to call the clinic you have any questions or concerns. The clinic phone number is [phone].  Please show the [facility] at check-in to the Emergency Department and triage nurse.

## 2017-04-08 NOTE — Progress Notes (Signed)
Per Dr. Jana Hakim, ok to treat with ANC of 1.4 and Plt of 97.

## 2017-04-11 ENCOUNTER — Telehealth: Payer: Self-pay | Admitting: Oncology

## 2017-04-11 LAB — CA 125

## 2017-04-11 NOTE — Telephone Encounter (Signed)
lvm to inform ptof r/s lab/infusion appts to 5/4 and 5/18 per LOS

## 2017-04-15 ENCOUNTER — Ambulatory Visit: Payer: Medicare Other

## 2017-04-15 ENCOUNTER — Other Ambulatory Visit: Payer: Medicare Other

## 2017-04-22 ENCOUNTER — Other Ambulatory Visit: Payer: Medicare Other

## 2017-04-22 ENCOUNTER — Ambulatory Visit: Payer: Medicare Other

## 2017-04-22 ENCOUNTER — Other Ambulatory Visit: Payer: Self-pay | Admitting: Oncology

## 2017-04-25 ENCOUNTER — Telehealth: Payer: Self-pay

## 2017-04-25 ENCOUNTER — Telehealth: Payer: Self-pay | Admitting: Oncology

## 2017-04-25 NOTE — Telephone Encounter (Signed)
lvm to inform pt of 5/11 MD appt per LOS

## 2017-04-25 NOTE — Telephone Encounter (Signed)
Pt called questioning whether she was to come in for appt this Friday.  Pt informed that she should be getting a call from scheduling today or tomorrow to schedule appts.  Msg sent to scheduling to schedule appt with GM and infusion

## 2017-04-27 NOTE — Progress Notes (Deleted)
ID: Cassandra Nichols   DOB: 1940/12/28  MR#: [id number]  POE#:[id number]   PCP:  [name], MD GYN:  [name], MD OTHER:  [name], MD  CHIEF COMPLAINT:  Recurrent Ovarian Cancer  CURRENT TREATMENT:  Topotecan, bevacizumab  NOTE: THIS PATIENT REQUESTS THAT ABSOLUTELY NO NUMERIC INFORMATION BE GIVEN REGARDING ANY LAB AND PARTICULARLY DOES NOT WANT TO KNOW SPECIFIC CA-125 RESULTS  INTERVAL HISTORY: Cassandra Nichols returns today for follow-up of her ovarian cancer. She is being treated currently with topotecan and bevacizumab. The intent was for her to be treated on days 1 and 8 of each 21 day cycle, but there have been many issues related to her work, transportation, finances, and family, and the result is that she received 2 doses in January, 14 days apart, then one treatment in February and month later, and now a fourth dose a month after the third dose.  As far as side effects are concerned she tolerates the treatment well, and she has had no problems with mouth sores, nausea, vomiting, or peripheral neuropathy. She continues to work irregularly as before.  REVIEW OF SYSTEMS: Reita Shindler tells me there are wonderful things going on with her family, particularly the grandchildren. She did not have time to tell me all about it today, she says. She does feel fatigued. She has no pain, no fever, no rash, no bleeding, and no problems with bowel or bladder habits, which are unchanged and normal for her. There's been no abdominal bloating cramping pain or masses. A detailed review of systems today was otherwise stable   HISTORY OF PRESENT ILLNESS: From the earlier summary note:   Cassandra Nichols was initially diagnosed with ovarian cancer in July of 2002. She underwent debulking surgery at that time for what proved to be a 4 of 18 lymph nodes positive disease. She was initially treated with 6 cycles of paclitaxel and carboplatin adjuvantly, completed in December of 2002.  Patient has had multiple recurrences  since December of 2002, and has been treated with multiple agents, as detailed below.   PAST MEDICAL HISTORY: Past Medical History:  Diagnosis Date  . Cancer ([place])   . 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 Ovarian cancer ([place]) 11/17/2011  . Shingles     PAST SURGICAL HISTORY: Past Surgical History:  Procedure Laterality Date  . GANGLION CYST EXCISION Left    Wrist    FAMILY HISTORY Family History  Problem Relation Age of Onset  . Hypertension Mother   . GER disease Mother   . Cancer Father     Cancer (stomach)   the patient's father died at the age of 96, from cancer that may have it started in his abdomen. He had 8 siblings including 2 sisters with breast cancer. The patient's mother had by Dr. cancer, but died from unrelated causes at the age of 2. The patient's maternal grandmother may have had ovarian cancer. The patient is an only child   GYNECOLOGIC HISTORY: Menarche age 58, first live birth age 45. The patient is GX P1. She went through the change of life around age 18. She subsequently had a TAH/BSO was part of her ovarian cancer treatment.  SOCIAL HISTORY: (Updated 03/08/2014) Cassandra Nichols lives alone. She works temporary jobs, primarily Nutritional therapist.  Her (adopted) daughter Cassandra Nichols lives [place] of [place] and is Mudlogger of the epidemiology and [place] there; she is married with 2 children. Son  Cassandra Nichols lives in [place], [place]. He has 4 children of his own.  He is Tree surgeon for a [employer]. His wife is a Marine scientist   ADVANCED DIRECTIVES: At the 01/16/2016 visit Cassandra Nichols tells me that her son Cassandra Nichols is her healthcare power of attorney. He can be reached at [phone]. [Her daughter Cassandra Nichols is her financial power of attorney.]  HEALTH MAINTENANCE:  (Updated  03/08/2014) Social History  Substance Use Topics  . Smoking status: Former Research scientist (life sciences)  . Smokeless tobacco: Never Used  . Alcohol use No     Comment: Occasionally     Colonoscopy:   PAP: s/p hysterectomy  BSO  Bone density:   Mammogram:  09/16/2015   Allergies  Allergen Reactions  . Penicillins Rash    Current Outpatient Prescriptions  Medication Sig Dispense Refill  . dexamethasone (DECADRON) 4 MG tablet Take 2 times a day with food the day before chemo, the day of chemo, and the day after chemo 30 tablet 4  . docusate sodium (COLACE) 100 MG capsule Take 100 mg by mouth 2 (two) times daily.    . fluticasone (FLONASE) 50 MCG/ACT nasal spray Place 1 spray into both nostrils as needed for allergies or rhinitis.    . folic acid (FOLVITE) 1 MG tablet Take 1 tablet (1 mg total) by mouth daily. 90 tablet 4  . hydrochlorothiazide (MICROZIDE) 12.5 MG capsule Take 2 capsules (25 mg total) by mouth daily. 30 capsule 6  . lisinopril (PRINIVIL,ZESTRIL) 20 MG tablet Take 1 tablet (20 mg total) by mouth daily. 90 tablet 0  . loratadine (CLARITIN) 10 MG tablet Take 10 mg by mouth daily as needed for allergies.    . Melatonin 3 MG TABS Take 3 mg by mouth at bedtime as needed. Reported on 03/12/2016    . prochlorperazine (COMPAZINE) 10 MG tablet Take 1 tablet (10 mg total) by mouth every 6 (six) hours as needed for nausea or vomiting. 30 tablet 6  . vitamin B-12 (CYANOCOBALAMIN) 1000 MCG tablet Take 1 tablet (1,000 mcg total) by mouth daily. 90 tablet 4   No current facility-administered medications for this visit.     OBJECTIVE: Middle-aged white woman In no acute distress There were no vitals filed for this visit.   There is no height or weight on file to calculate BMI.    ECOG FS: 1 There were no vitals filed for this visit.   Temperature on 03/11/2017 was 97.6, pulse 89, respiratory rate 16, and blood pressure was 145/85, which is high for her. Room air saturation was 98%. Her weight is slightly up at 118 pounds.  Sclerae unicteric, pupils round and equal Oropharynx clear and moist No cervical or supraclavicular adenopathy Lungs no rales or rhonchi Heart regular rate and rhythm Abd soft,  nontender, positive bowel sounds, no masses palpated MSK no focal spinal tenderness, no upper extremity lymphedema Neuro: nonfocal, well oriented, appropriate affect Breasts: Deferred   LAB RESULTS:   Lab Results  Component Value Date   WBC 3.3 (L) 04/08/2017   NEUTROABS 1.4 (L) 04/08/2017   HGB 9.2 (L) 04/08/2017   HCT 28.2 (L) 04/08/2017   MCV 87.9 04/08/2017   PLT 97 (L) 04/08/2017        Chemistry      Component Value Date/Time   NA 142 04/08/2017 1507   K 4.2 04/08/2017 1507   CL 106 09/29/2016 1343   CL 106 05/09/2013 1304   CO2 24 04/08/2017 1507   BUN 25.2 04/08/2017 1507   CREATININE 1.1 04/08/2017 1507      Component Value  Date/Time   CALCIUM 9.5 04/08/2017 1507   ALKPHOS 97 04/08/2017 1507   AST 26 04/08/2017 1507   ALT 15 04/08/2017 1507   BILITOT 0.42 04/08/2017 1507      Ref Range & Units 4wk ago 15mo ago 84mo ago 73mo ago    Cancer Antigen (CA) 125 0.0 - 38.1 U/mL 8,321.0   8,106.0CM   9,329.0CM   10,661.0CM    Comments: Results confirmed on     STUDIES: No results found.   ASSESSMENT: 76 y.o.  [place] woman (1)  with a history of ovarian cancer dating back to July 2002 when she underwent debulking surgery for what proved to be 4/18 lymph node positive disease.   (2)  She received 6 cycles of paclitaxel/carboplatin adjuvantly completed in December 2002.   (3)  She had her first recurrence in December 2005 treated with paclitaxel;   (4)  second recurrence May 2007 treated with [place] and paclitaxel;   (5)  third recurrence January 2010 treated with topotecan and Gemzar;   (6)  fourth recurrence January 2012 treated with single-agent carboplatin (January to August 2012) with initial response, then plateau; with progression January 2013 (less than 6 months after last carbo dose) treated with single-agent gemzar given Q2 weeks (January to April 2013), with evidence of progression  (7) Doxil strartedJuly 2013 6, with evidence of initial response,  and fair tolerance. Held between May and August 2014 due to patient's work schedule and scheduling issues.  Resumed treatment as of 08/24/2013, with some delays secondary to the patient's schedule. As of early December 2014, her total cumulative dose of Doxil reached 560 mg per meter square. In addition there was also an upward trend of the CA 125. Doxil was discontinued (last dose 10/30/2013). Echocardiogram on 11/14/2013 showed a well preserved ejection fraction of 55-60%.  (8)  treated with single agent paclitaxel, given on days one and 8 of each 21 day cycle, first dose on 12/27/2013,with continuing response, but mild to moderate toxicity; 11th and last dose 04/12/2014   (9) carboplatin and cyclophosphamide started on 08/28/14 given every 21 days-- stopped 10/10/2014 after a reaction to carboplatin (also no evidence of response)  (10) cyclophosphamide and bevacizumab started 12/02/2014, stopped 12/26/2015 with progression  (a) CT scan of the abdomen and pelvis 06/19/2015 shows stable disease  (b) CT of the chest/abd/pelvis 09/12/2015 stable  (c) PET scan 01/15/2016 shows progression in the liver and upper abdominal nodes  (11) abraxane started 01/16/2016,  repeated every 14 days with evidence of response  (a) last dose 06/25/2016, discontinued because of neuropathy  (12) offered genetics testing at the 01/16/2016 visit, but declined  (13) started pemetrexed 08/27/2016  (a) cycle 2 delayed 2 weeks because of the patient's travel schedule   (b) stopped after cycle 5, given 12/03/2016, because of a continuing rise in the CA-125  (14) started topotecan 12/24/2016 given every 14 days with bevacizumab given every 14 days  (a) third dose (cycle 2 dose 1) omitted because of patient's travel  (b) fourth dose delayed 2 weeks for similar reasons  (c) discontinued because of neuropathy, last dose 04/08/2017  (d) note, no evidence of response by CEA 125    PLAN:  Faustina Gebert is tolerating her treatment  well and the rise in her CA 125 has plateaued and is actually down from pre-treatment levels. I am hopeful if we continue along this line we will maintain disease control with tolerable toxicities.  Her anemia is moderate. At this point I  don't believe she needs Aranesp, but this is something to consider if the hemoglobin drops steadily below 9.  Her blood pressure is up, doubtless because of the bevacizumab. She has not been taking her blood pressure medication, partly for financial reasons and partly because she was not sure she needed it. Today we reviewed that. She will be on lisinopril 20 mg daily and hydrochlorothiazide 25 mg daily. We will repeat her blood pressure at the next visit.  She is now 16 years out from her diagnosis of ovarian cancer and 13 years out from her first recurrence. This is remarkable, but it will not be sustainable at some point. Right now continuing topotecan seems to be the best fit for her and we will try to give it every 2 weeks at a time of day that does not interfere with her work. We have adjusted the bevacizumab dose accordingly  She knows to call for any other problems that may develop before her return   Chauncey Cruel, MD    04/27/2017

## 2017-04-28 ENCOUNTER — Other Ambulatory Visit: Payer: Self-pay | Admitting: Oncology

## 2017-04-28 MED ORDER — OLAPARIB 150 MG PO TABS: 300.0000 mg | ORAL_TABLET | Freq: Two times a day (BID) | ORAL | 6 refills | Status: DC

## 2017-04-29 ENCOUNTER — Other Ambulatory Visit: Payer: Medicare Other

## 2017-04-29 ENCOUNTER — Telehealth: Payer: Self-pay

## 2017-04-29 ENCOUNTER — Ambulatory Visit: Payer: Medicare Other

## 2017-04-29 ENCOUNTER — Ambulatory Visit: Payer: Medicare Other | Admitting: Oncology

## 2017-04-29 NOTE — Telephone Encounter (Signed)
Called LVM to let pt know that I had cancelled all her appts today and scheduled pt for 4pm on Monday to see Dr. Jana Hakim 05/02/17. Confirmed this with Dr.Magrinat as well. Call back number provided for any questions.

## 2017-05-02 ENCOUNTER — Ambulatory Visit (HOSPITAL_BASED_OUTPATIENT_CLINIC_OR_DEPARTMENT_OTHER): Payer: Medicare Other | Admitting: Oncology

## 2017-05-02 VITALS — BP 150/67 | HR 88 | Temp 98.1°F | Resp 18 | Ht 59.0 in | Wt 108.9 lb

## 2017-05-02 DIAGNOSIS — C569 Malignant neoplasm of unspecified ovary: Secondary | ICD-10-CM

## 2017-05-02 DIAGNOSIS — G629 Polyneuropathy, unspecified: Secondary | ICD-10-CM | POA: Diagnosis not present

## 2017-05-02 DIAGNOSIS — R970 Elevated carcinoembryonic antigen [CEA]: Secondary | ICD-10-CM | POA: Diagnosis not present

## 2017-05-02 NOTE — Progress Notes (Signed)
ID: Cassandra Nichols   DOB: Apr 01, 1941  MR#: [id number]  RWE#:[id number]   PCP:  [name], MD GYN:  [name], MD OTHER:  [name], MD  CHIEF COMPLAINT:  Recurrent Ovarian Cancer  CURRENT TREATMENT:  To start olaparib  NOTE: THIS PATIENT REQUESTS THAT ABSOLUTELY NO NUMERIC INFORMATION BE GIVEN REGARDING ANY LAB AND PARTICULARLY DOES NOT WANT TO KNOW SPECIFIC CA-125 RESULTS  INTERVAL HISTORY: Cassandra Nichols returns todaya for follow up of her recurrent metastatic ovarian cancer. She was being treated with topotecan, with no clear indication of response, but developed significant neuropathy, and the treatment had to be stopped. She received the last dose 04/08/2017. She still has significant numbeness in her fingertips and toe tips, and some tingling in the evening. She is here today to discuss alternatives   REVIEW OF SYSTEMS: Cassandra Nichols has actually been trying to lose weight, using a weight-watchers program, and is just about at the level she thinks ideal for her. She has no pain, no bloating or swelling, no change in bowel (QOD) or bladder (stress incontinence) habits. 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 A detailed ROS today was otherwise stable   HISTORY OF PRESENT ILLNESS: From the earlier summary note:   Cassandra Nichols was initially diagnosed with ovarian cancer in July of 2002. She underwent debulking surgery at that time for what proved to be a 4 of 18 lymph nodes positive disease. She was initially treated with 6 cycles of paclitaxel and carboplatin adjuvantly, completed in December of 2002.  Patient has had multiple recurrences since December of 2002, and has been treated with multiple agents, as detailed below.   PAST MEDICAL HISTORY: Past Medical History:  Diagnosis Date  . Cancer ([facility])   . 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 Ovarian cancer ([facility]) 11/17/2011  . Shingles     PAST SURGICAL HISTORY: Past Surgical History:  Procedure Laterality  Date  . GANGLION CYST EXCISION Left    Wrist    FAMILY HISTORY Family History  Problem Relation Age of Onset  . Hypertension Mother   . GER disease Mother   . Cancer Father        Cancer (stomach)   the patient's father died at the age of 50, from cancer that may have it started in his abdomen. He had 8 siblings including 2 sisters with breast cancer. The patient's mother had by Dr. cancer, but died from unrelated causes at the age of 5. The patient's maternal grandmother may have had ovarian cancer. The patient is an only child   GYNECOLOGIC HISTORY: Menarche age 26, first live birth age 12. The patient is GX P1. She went through the change of life around age 27. She subsequently had a TAH/BSO was part of her ovarian cancer treatment.  SOCIAL HISTORY: (Updated 03/08/2014) Cassandra Nichols lives alone. She works temporary jobs, primarily Nutritional therapist.  Her (adopted) daughter Cassandra Nichols lives [place] of [place] and is Mudlogger of the epidemiology and [place] there; she is married with 2 children. Son  Cassandra Nichols lives in [place], [place]. He has 4 children of his own. He is Tree surgeon for a [employer]. His wife is a Marine scientist.    ADVANCED DIRECTIVES: At the 01/16/2016 visit Cassandra Nichols tells me that her son Cassandra Nichols is her healthcare power of attorney. He can be reached at [phone]. [Her daughter Cassandra Nichols is her financial power of attorney.]  HEALTH MAINTENANCE:  (Updated  03/08/2014) Social  History  Substance Use Topics  . Smoking status: Former Research scientist (life sciences)  . Smokeless tobacco: Never Used  . Alcohol use No     Comment: Occasionally     Colonoscopy:   PAP: s/p hysterectomy BSO  Bone density:   Mammogram:  09/16/2015   Allergies  Allergen Reactions  . Penicillins Rash    Current Outpatient Prescriptions  Medication Sig Dispense Refill  . dexamethasone (DECADRON) 4 MG tablet Take 2 times a day with food the day before chemo, the day of chemo, and the day after chemo 30 tablet 4  . docusate  sodium (COLACE) 100 MG capsule Take 100 mg by mouth 2 (two) times daily.    . fluticasone (FLONASE) 50 MCG/ACT nasal spray Place 1 spray into both nostrils as needed for allergies or rhinitis.    . folic acid (FOLVITE) 1 MG tablet Take 1 tablet (1 mg total) by mouth daily. 90 tablet 4  . hydrochlorothiazide (MICROZIDE) 12.5 MG capsule Take 2 capsules (25 mg total) by mouth daily. 30 capsule 6  . lisinopril (PRINIVIL,ZESTRIL) 20 MG tablet Take 1 tablet (20 mg total) by mouth daily. 90 tablet 0  . loratadine (CLARITIN) 10 MG tablet Take 10 mg by mouth daily as needed for allergies.    . Melatonin 3 MG TABS Take 3 mg by mouth at bedtime as needed. Reported on 03/12/2016    . Olaparib 150 MG TABS Take 300 mg by mouth 2 (two) times daily. 120 tablet 6  . prochlorperazine (COMPAZINE) 10 MG tablet Take 1 tablet (10 mg total) by mouth every 6 (six) hours as needed for nausea or vomiting. 30 tablet 6  . vitamin B-12 (CYANOCOBALAMIN) 1000 MCG tablet Take 1 tablet (1,000 mcg total) by mouth daily. 90 tablet 4   No current facility-administered medications for this visit.     OBJECTIVE: Middle-aged white woman who appears well Vitals:   05/02/17 1615  BP: (!) 150/67  Pulse: 88  Resp: 18  Temp: 98.1 F (36.7 C)     Body mass index is 22 kg/m.    ECOG FS: 0 Filed Weights   05/02/17 1615  Weight: 108 lb 14.4 oz (49.4 kg)    Sclerae unicteric, EOMs intact Oropharynx clear and moist No cervical or supraclavicular adenopathy Lungs no rales or rhonchi Heart regular rate and rhythm Abd soft, nontender, positive bowel sounds MSK no focal spinal tenderness, no upper extremity lymphedema Neuro: nonfocal, well oriented, appropriate affect Breasts: Deferred   LAB RESULTS:   Lab Results  Component Value Date   WBC 3.3 (L) 04/08/2017   NEUTROABS 1.4 (L) 04/08/2017   HGB 9.2 (L) 04/08/2017   HCT 28.2 (L) 04/08/2017   MCV 87.9 04/08/2017   PLT 97 (L) 04/08/2017        Chemistry        Component Value Date/Time   NA 142 04/08/2017 1507   K 4.2 04/08/2017 1507   CL 106 09/29/2016 1343   CL 106 05/09/2013 1304   CO2 24 04/08/2017 1507   BUN 25.2 04/08/2017 1507   CREATININE 1.1 04/08/2017 1507      Component Value Date/Time   CALCIUM 9.5 04/08/2017 1507   ALKPHOS 97 04/08/2017 1507   AST 26 04/08/2017 1507   ALT 15 04/08/2017 1507   BILITOT 0.42 04/08/2017 1507      Ref Range & Units 4wk ago 88mo ago 70mo ago 46mo ago    Cancer Antigen (CA) 125 0.0 - 38.1 U/mL 8,321.0   8,106.0CM  9,329.0CM   10,661.0CM    Comments: Results confirmed on     STUDIES: No results found.   ASSESSMENT: 76 y.o.  [place] woman (1)  with a history of ovarian cancer dating back to July 2002 when she underwent debulking surgery for what proved to be 4/18 lymph node positive disease.   (2)  She received 6 cycles of paclitaxel/carboplatin adjuvantly completed in December 2002.   (3)  She had her first recurrence in December 2005 treated with paclitaxel;   (4)  second recurrence May 2007 treated with [place] and paclitaxel;   (5)  third recurrence January 2010 treated with topotecan and Gemzar;   (6)  fourth recurrence January 2012 treated with single-agent carboplatin (January to August 2012) with initial response, then plateau; with progression January 2013 (less than 6 months after last carbo dose) treated with single-agent gemzar given Q2 weeks (January to April 2013), with evidence of progression  (7) Doxil strartedJuly 2013 6, with evidence of initial response, and fair tolerance. Held between May and August 2014 due to patient's work schedule and scheduling issues.  Resumed treatment as of 08/24/2013, with some delays secondary to the patient's schedule. As of early December 2014, her total cumulative dose of Doxil reached 560 mg per meter square. In addition there was also an upward trend of the CA 125. Doxil was discontinued (last dose 10/30/2013). Echocardiogram on 11/14/2013  showed a well preserved ejection fraction of 55-60%.  (8)  treated with single agent paclitaxel, given on days one and 8 of each 21 day cycle, first dose on 12/27/2013,with continuing response, but mild to moderate toxicity; 11th and last dose 04/12/2014   (9) carboplatin and cyclophosphamide started on 08/28/14 given every 21 days-- stopped 10/10/2014 after a reaction to carboplatin (also no evidence of response)  (10) cyclophosphamide and bevacizumab started 12/02/2014, stopped 12/26/2015 with progression  (a) CT scan of the abdomen and pelvis 06/19/2015 shows stable disease  (b) CT of the chest/abd/pelvis 09/12/2015 stable  (c) PET scan 01/15/2016 shows progression in the liver and upper abdominal nodes  (11) abraxane started 01/16/2016,  repeated every 14 days with evidence of response  (a) last dose 06/25/2016, discontinued because of neuropathy  (12) offered genetics testing at the 01/16/2016 visit, but declined  (13) started pemetrexed 08/27/2016  (a) cycle 2 delayed 2 weeks because of the patient's travel schedule   (b) stopped after cycle 5, given 12/03/2016, because of a continuing rise in the CA-125  (14) started topotecan 12/24/2016 given every 14 days with bevacizumab given every 14 days  (a) third dose (cycle 2 dose 1) omitted because of patient's travel  (b) fourth dose delayed 2 weeks for similar reasons    PLAN:  I spent approximately 30 minutes with Cassandra Nichols with most of that time spent discussing her overall situation and choices. She has had ovarian cancer now for 16 years, and we are 13 years out from her first recurrence. We have used just about every drug we know to work in this situation, many of them more than once. The cancer has progressed despite them and now we are limited by the development of neuropathy-- that essentially disqualifies further taxanes, eribulin, likely navelbine (which she has not yet had). In short I am running out of options to treat her.  Granted  this, and the very high CA 125 level, it is remarkable how symptom free she is and what an excellent funtional status she has.  Accordingly we discussed olaparib. I have her  written information on the possible side efects, complications and toxicities but she refused to read it-- she feels she is very suggestible and she prefers to deal with side effects if and when they occur. She would also like to wait on starting these pills if possible as this is the time of year when she actuallt has a job and she wants to make the most of it  Though we are going to try this new drug, I urged her to let her family know we may be coming to the end of the line and at some point it may make more sense to stop therapy and switch to comfort/ palliative care than to continue to be treated with toxic drugs than are very unlikely to work.  I will see her one week after starting olaparib and then two weeks after that. If she tolerates it well we will follow her on a monthly basis.  She knows ro call for any problem that may develop eore the next visit    Chauncey Cruel, MD    05/02/2017

## 2017-05-03 ENCOUNTER — Telehealth: Payer: Self-pay | Admitting: Pharmacist

## 2017-05-03 DIAGNOSIS — C569 Malignant neoplasm of unspecified ovary: Secondary | ICD-10-CM

## 2017-05-03 MED ORDER — OLAPARIB 100 MG PO TABS: 200.0000 mg | ORAL_TABLET | Freq: Two times a day (BID) | ORAL | 6 refills | Status: AC

## 2017-05-03 NOTE — Telephone Encounter (Signed)
Oral Chemotherapy Pharmacist Encounter  Received notification from [facility] that patient's Lynparza prescription e-scribed by MD on 04/28/17 would require prior authorization.  04/08/17 labs reviewed, OK for treatment, CrCl ~34 ml/min- manufacturer recommends reduced dose of 200mg  BID for CrCl 31-50 ml/min. D/w MD, OK to decrease dose per recommendation.  Current medication list in Epic assessed, no DDIs with [place] identified.  Prior authorization submitted on CoverMyMeds [place] Status is pending.  A new prescription with reduced dose of 200mg  BID has been e-scribed to [facility]. I called and had them put note on full dose prescription that dose is reduced.  Oral oncology Clinic will continue to follow.  Johny Drilling, PharmD, BCPS, BCOP 05/03/2017  4:15 PM Oral Oncology Clinic [phone]

## 2017-05-06 ENCOUNTER — Ambulatory Visit: Payer: Medicare Other

## 2017-05-06 ENCOUNTER — Other Ambulatory Visit: Payer: Medicare Other

## 2017-05-20 ENCOUNTER — Ambulatory Visit: Payer: Medicare Other

## 2017-05-20 ENCOUNTER — Other Ambulatory Visit: Payer: Medicare Other

## 2017-05-24 NOTE — Telephone Encounter (Signed)
Oral Chemotherapy Pharmacist Encounter  Received notifcation from [facility] that prior authorization of patient's Lynparza prescription has been DENIED stating that it is only FDA indicated for a patient without known or suspected deleterious germline BRCA mutation  L[place]ie [place] is also FDA indicated as second line maintenance therapy without BRCA mutation, therefore, we are not requesting off-label usage of Lynparza.  I called [place] (HMO) at [phone] to request urgent appeal. Request sent to appeals department. Appeals department to contact [facility] for additional clinical information.  Johny Drilling, PharmD, BCPS, BCOP 05/24/2017  10:42 AM Oral Oncology Clinic [phone]

## 2017-05-30 ENCOUNTER — Other Ambulatory Visit: Payer: Medicare Other

## 2017-05-30 ENCOUNTER — Ambulatory Visit: Payer: Medicare Other | Admitting: Oncology

## 2017-05-30 NOTE — Telephone Encounter (Signed)
Oral Chemotherapy Pharmacist Encounter  Received notification from [place] that prior authorization denial of patient's Lonie Peak has been overturned and prior authorization is not approved Effective dates: 05/03/17-05/03/18 Auth# [id number]  I will alert [facility] to continue to process prescription.  Oral Oncology Clinic will continue to follow.  Johny Drilling, PharmD, BCPS, BCOP 05/30/2017  2:57 PM Oral Oncology Clinic [phone]

## 2017-05-30 NOTE — Telephone Encounter (Signed)
I think you meant "is NOW approved? Is that correct?  Thanks

## 2017-06-03 ENCOUNTER — Ambulatory Visit: Payer: Medicare Other | Admitting: Oncology

## 2017-06-03 ENCOUNTER — Ambulatory Visit: Payer: Medicare Other

## 2017-06-03 ENCOUNTER — Other Ambulatory Visit: Payer: Medicare Other

## 2017-06-06 ENCOUNTER — Ambulatory Visit (HOSPITAL_BASED_OUTPATIENT_CLINIC_OR_DEPARTMENT_OTHER): Payer: Medicare Other | Admitting: Oncology

## 2017-06-06 VITALS — BP 132/73 | HR 92 | Temp 97.9°F | Resp 20 | Ht 59.0 in | Wt 109.0 lb

## 2017-06-06 DIAGNOSIS — N393 Stress incontinence (female) (male): Secondary | ICD-10-CM

## 2017-06-06 DIAGNOSIS — C569 Malignant neoplasm of unspecified ovary: Secondary | ICD-10-CM | POA: Diagnosis not present

## 2017-06-06 DIAGNOSIS — C787 Secondary malignant neoplasm of liver and intrahepatic bile duct: Secondary | ICD-10-CM

## 2017-06-06 DIAGNOSIS — C772 Secondary and unspecified malignant neoplasm of intra-abdominal lymph nodes: Secondary | ICD-10-CM | POA: Diagnosis not present

## 2017-06-06 NOTE — Progress Notes (Signed)
ID: Cassandra Nichols   DOB: 03-01-1941  MR#: [id number]  HYW#:[id number]   PCP:  Cassandra Austin, MD GYN:  Cassandra Laroche, MD OTHER:  Cassandra Peach, MD  CHIEF COMPLAINT:  Recurrent Ovarian Cancer  CURRENT TREATMENT: Pending start of olaparib  NOTE: THIS PATIENT REQUESTS THAT ABSOLUTELY NO NUMERIC INFORMATION BE GIVEN REGARDING ANY LAB AND PARTICULARLY DOES NOT WANT TO KNOW SPECIFIC CA-125 RESULTS  INTERVAL HISTORY: Cassandra Nichols returns today for follow-up and treatment of her multiply recurrent metastatic ovarian cancer. We obtained approval of all lap rib for her and the drug is available at the [facility] for her. She is ready to get started.  REVIEW OF SYSTEMS: The interval history is generally unremarkable. Her weight has stabilized. She spent a long weekend in [place] where one of her grandchildren graduated from high school and she was able to spend time with several of the grandchildren which was a great plus for her. She is currently on break from work but will be starting work again in about 2 weeks. She is not hydrating herself as forcefully as I would like. She does have some stress urinary incontinence issues which complicates that. Otherwise a detailed review of systems today was stable   HISTORY OF PRESENT ILLNESS: From the earlier summary note:   Cassandra Nichols was initially diagnosed with ovarian cancer in July of 2002. She underwent debulking surgery at that time for what proved to be a 4 of 18 lymph nodes positive disease. She was initially treated with 6 cycles of paclitaxel and carboplatin adjuvantly, completed in December of 2002.  Patient has had multiple recurrences since December of 2002, and has been treated with multiple agents, as detailed below.   PAST MEDICAL HISTORY: Past Medical History:  Diagnosis Date  . Cancer ([place])   . 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 Ovarian cancer ([place]) 11/17/2011  . Shingles     PAST SURGICAL HISTORY: Past Surgical History:   Procedure Laterality Date  . GANGLION CYST EXCISION Left    Wrist    FAMILY HISTORY Family History  Problem Relation Age of Onset  . Hypertension Mother   . GER disease Mother   . Cancer Father        Cancer (stomach)   the patient's father died at the age of 25, from cancer that may have it started in his abdomen. He had 8 siblings including 2 sisters with breast cancer. The patient's mother had by Dr. cancer, but died from unrelated causes at the age of 55. The patient's maternal grandmother may have had ovarian cancer. The patient is an only child   GYNECOLOGIC HISTORY: Menarche age 9, first live birth age 58. The patient is GX P1. She went through the change of life around age 49. She subsequently had a TAH/BSO was part of her ovarian cancer treatment.  SOCIAL HISTORY: (Updated 03/08/2014) Cassandra Nichols lives alone. She works temporary jobs, primarily Nutritional therapist.  Her (adopted) daughter Cassandra Nichols lives [place] and is Mudlogger of the epidemiology and [place] there; she is married with 2 children. Son  Cassandra Nichols lives in [place], [place]. He has 4 children of his own. He is Tree surgeon for a [employer]. His wife is a Marine scientist.    ADVANCED DIRECTIVES: At the 01/16/2016 visit Cassandra Nichols tells me that her son Cassandra Nichols is her healthcare power of attorney. He can be reached at [phone]. [Her daughter Cassandra Nichols is her financial power of attorney.]  HEALTH MAINTENANCE:  (  Updated  03/08/2014) Social History  Substance Use Topics  . Smoking status: Former Research scientist (life sciences)  . Smokeless tobacco: Never Used  . Alcohol use No     Comment: Occasionally     Colonoscopy:   PAP: s/p hysterectomy BSO  Bone density:   Mammogram:  09/16/2015   Allergies  Allergen Reactions  . Penicillins Rash    Current Outpatient Prescriptions  Medication Sig Dispense Refill  . dexamethasone (DECADRON) 4 MG tablet Take 2 times a day with food the day before chemo, the day of chemo, and the day after chemo 30  tablet 4  . docusate sodium (COLACE) 100 MG capsule Take 100 mg by mouth 2 (two) times daily.    . fluticasone (FLONASE) 50 MCG/ACT nasal spray Place 1 spray into both nostrils as needed for allergies or rhinitis.    . folic acid (FOLVITE) 1 MG tablet Take 1 tablet (1 mg total) by mouth daily. 90 tablet 4  . hydrochlorothiazide (MICROZIDE) 12.5 MG capsule Take 2 capsules (25 mg total) by mouth daily. 30 capsule 6  . lisinopril (PRINIVIL,ZESTRIL) 20 MG tablet Take 1 tablet (20 mg total) by mouth daily. 90 tablet 0  . loratadine (CLARITIN) 10 MG tablet Take 10 mg by mouth daily as needed for allergies.    . Melatonin 3 MG TABS Take 3 mg by mouth at bedtime as needed. Reported on 03/12/2016    . olaparib (LYNPARZA) 100 MG TABS Take 200 mg by mouth 2 (two) times daily. 120 tablet 6  . prochlorperazine (COMPAZINE) 10 MG tablet Take 1 tablet (10 mg total) by mouth every 6 (six) hours as needed for nausea or vomiting. 30 tablet 6  . vitamin B-12 (CYANOCOBALAMIN) 1000 MCG tablet Take 1 tablet (1,000 mcg total) by mouth daily. 90 tablet 4   No current facility-administered medications for this visit.     OBJECTIVE: Middle-aged white woman In no acute distress  Vitals:   06/06/17 1602  BP: 132/73  Pulse: 92  Resp: 20  Temp: 97.9 F (36.6 C)     Body mass index is 22.02 kg/m.    ECOG FS: 0 Filed Weights   06/06/17 1602  Weight: 109 lb (49.4 kg)    Sclerae unicteric, pupils round and equal Oropharynx clear and moist No cervical or supraclavicular adenopathy Lungs no rales or rhonchi Heart regular rate and rhythm Abd soft, nontender, positive bowel sounds, no swelling or fluid wave MSK no focal spinal tenderness, no peripheral edema Neuro: nonfocal, well oriented, appropriate affect Breasts: Deferred    LAB RESULTS:   Lab Results  Component Value Date   WBC 3.3 (L) 04/08/2017   NEUTROABS 1.4 (L) 04/08/2017   HGB 9.2 (L) 04/08/2017   HCT 28.2 (L) 04/08/2017   MCV 87.9 04/08/2017    PLT 97 (L) 04/08/2017        Chemistry      Component Value Date/Time   NA 142 04/08/2017 1507   K 4.2 04/08/2017 1507   CL 106 09/29/2016 1343   CL 106 05/09/2013 1304   CO2 24 04/08/2017 1507   BUN 25.2 04/08/2017 1507   CREATININE 1.1 04/08/2017 1507      Component Value Date/Time   CALCIUM 9.5 04/08/2017 1507   ALKPHOS 97 04/08/2017 1507   AST 26 04/08/2017 1507   ALT 15 04/08/2017 1507   BILITOT 0.42 04/08/2017 1507      STUDIES: The CA-125 was steadily rising when last checked at 9008  ASSESSMENT: 76 y.o.  [employer]  woman (1)  with a history of ovarian cancer dating back to July 2002 when she underwent debulking surgery for what proved to be 4/18 lymph node positive disease.   (2)  She received 6 cycles of paclitaxel/carboplatin adjuvantly completed in December 2002.   (3)  She had her first recurrence in December 2005 treated with paclitaxel;   (4)  second recurrence May 2007 treated with [place] and paclitaxel;   (5)  third recurrence January 2010 treated with topotecan and Gemzar;   (6)  fourth recurrence January 2012 treated with single-agent carboplatin (January to August 2012) with initial response, then plateau; with progression January 2013 (less than 6 months after last carbo dose) treated with single-agent gemzar given Q2 weeks (January to April 2013), with evidence of progression  (7) Doxil strartedJuly 2013 6, with evidence of initial response, and fair tolerance. Held between May and August 2014 due to patient's work schedule and scheduling issues.  Resumed treatment as of 08/24/2013, with some delays secondary to the patient's schedule. As of early December 2014, her total cumulative dose of Doxil reached 560 mg per meter square. In addition there was also an upward trend of the CA 125. Doxil was discontinued (last dose 10/30/2013). Echocardiogram on 11/14/2013 showed a well preserved ejection fraction of 55-60%.  (8)  treated with single agent  paclitaxel, given on days one and 8 of each 21 day cycle, first dose on 12/27/2013,with continuing response, but mild to moderate toxicity; 11th and last dose 04/12/2014   (9) carboplatin and cyclophosphamide started on 08/28/14 given every 21 days-- stopped 10/10/2014 after a reaction to carboplatin (also no evidence of response)  (10) cyclophosphamide and bevacizumab started 12/02/2014, stopped 12/26/2015 with progression  (a) CT scan of the abdomen and pelvis 06/19/2015 shows stable disease  (b) CT of the chest/abd/pelvis 09/12/2015 stable  (c) PET scan 01/15/2016 shows progression in the liver and upper abdominal nodes  (11) abraxane started 01/16/2016,  repeated every 14 days with evidence of response  (a) last dose 06/25/2016, discontinued because of neuropathy  (12) offered genetics testing at the 01/16/2016 visit, but declined  (13) started pemetrexed 08/27/2016  (a) cycle 2 delayed 2 weeks because of the patient's travel schedule   (b) stopped after cycle 5, given 12/03/2016, because of a continuing rise in the CA-125  (14) started topotecan 12/24/2016 given every 14 days with bevacizumab given every 14 days  (a) third dose (cycle 2 dose 1) omitted because of patient's travel  (b) fourth dose delayed 2 weeks for similar reasons  (c) discontinued after 04/08/2017 dose with evidence of progression  (15) to start olaparib: approval pending    PLAN:  Cassandra Nichols is ready to start treatment with a lap rib but it turned out her co-pay would be $3000 and she simply does not have that kind of money.  Our oral chemotherapy pharmacy specialists are going to be applying to the company to see if we can possibly obtain this drug for her.  Once she starts that would like to check her lab work every 2 weeks 2 and then monthly. We will be following her CA-125 on a monthly basis for the first 3-4 months. She does not like me to discuss possible toxicities, side effects and complications of  these agents but I have urged her to call me with any problem that may develop once she starts the drug, whether or not she thinks it may be related to it.  At the last visit I urged  her to discuss her overall situation with her children and specifically the fact that we may be running out of options in her treatment.  She knows to call for any other problems that may develop before her return visit here.      Chauncey Cruel, MD    06/06/2017

## 2017-06-08 ENCOUNTER — Telehealth: Payer: Self-pay | Admitting: Pharmacy Technician

## 2017-06-08 NOTE — Telephone Encounter (Signed)
Oral Oncology Patient Advocate Encounter  Met patient in waiting room to complete application for [place] patient assistance foundation for Lonie Peak in an effort to reduce patient's out of pocket expense to $0.    Application completed and faxed to [phone].   [employer] patient assistance phone number for follow up is [phone].   This encounter will be updated until final determination.   Fabio Asa. Melynda Keller, [facility] [phone] 06/08/2017 3:31 PM

## 2017-06-13 ENCOUNTER — Other Ambulatory Visit: Payer: Self-pay | Admitting: Oncology

## 2017-06-14 NOTE — Telephone Encounter (Signed)
Oral Oncology Patient Advocate Encounter  Received notification from [employer]'s Patient Assistance program that patient has been successfully enrolled into their program to receive Lynparza  at $0 until 06/13/2018 from the manufacturer.  Number to follow up on medication shipment or for refills is [phone].    Patient ID# [id number]  Oral Oncology Clinic will continue to follow.  Gilmore Laroche, CPhT, [place] Oral Oncology Patient Advocate [phone] 06/14/2017 1:11 PM

## 2017-06-24 ENCOUNTER — Other Ambulatory Visit: Payer: Medicare Other

## 2017-07-04 ENCOUNTER — Other Ambulatory Visit: Payer: Self-pay

## 2017-07-04 MED ORDER — LISINOPRIL 20 MG PO TABS: 20.0000 mg | ORAL_TABLET | Freq: Every day | ORAL | 3 refills | Status: DC

## 2017-07-04 MED ORDER — HYDROCHLOROTHIAZIDE 12.5 MG PO CAPS: 25.0000 mg | ORAL_CAPSULE | Freq: Every day | ORAL | 3 refills | Status: DC

## 2017-07-08 ENCOUNTER — Other Ambulatory Visit (HOSPITAL_BASED_OUTPATIENT_CLINIC_OR_DEPARTMENT_OTHER): Payer: Medicare Other

## 2017-07-08 ENCOUNTER — Other Ambulatory Visit: Payer: Medicare Other

## 2017-07-08 DIAGNOSIS — C569 Malignant neoplasm of unspecified ovary: Secondary | ICD-10-CM | POA: Diagnosis not present

## 2017-07-08 LAB — CBC WITH DIFFERENTIAL/PLATELET
BASO%: 1 % (ref 0.0–2.0)
Basophils Absolute: 0 10*3/uL (ref 0.0–0.1)
EOS ABS: 0 10*3/uL (ref 0.0–0.5)
EOS%: 0.3 % (ref 0.0–7.0)
HCT: 31.7 % — ABNORMAL LOW (ref 34.8–46.6)
HGB: 10.3 g/dL — ABNORMAL LOW (ref 11.6–15.9)
LYMPH%: 25.7 % (ref 14.0–49.7)
MCH: 28.8 pg (ref 25.1–34.0)
MCHC: 32.3 g/dL (ref 31.5–36.0)
MCV: 89.2 fL (ref 79.5–101.0)
MONO#: 0.3 10*3/uL (ref 0.1–0.9)
MONO%: 7.4 % (ref 0.0–14.0)
NEUT%: 65.6 % (ref 38.4–76.8)
NEUTROS ABS: 3 10*3/uL (ref 1.5–6.5)
Platelets: 167 10*3/uL (ref 145–400)
RBC: 3.56 10*6/uL — ABNORMAL LOW (ref 3.70–5.45)
RDW: 17.9 % — ABNORMAL HIGH (ref 11.2–14.5)
WBC: 4.5 10*3/uL (ref 3.9–10.3)
lymph#: 1.2 10*3/uL (ref 0.9–3.3)

## 2017-07-08 LAB — COMPREHENSIVE METABOLIC PANEL
ALT: 7 U/L (ref 0–55)
AST: 19 U/L (ref 5–34)
Albumin: 2.9 g/dL — ABNORMAL LOW (ref 3.5–5.0)
Alkaline Phosphatase: 74 U/L (ref 40–150)
Anion Gap: 10 mEq/L (ref 3–11)
BILIRUBIN TOTAL: 0.5 mg/dL (ref 0.20–1.20)
BUN: 23.2 mg/dL (ref 7.0–26.0)
CO2: 22 meq/L (ref 22–29)
Calcium: 9.3 mg/dL (ref 8.4–10.4)
Chloride: 103 mEq/L (ref 98–109)
Creatinine: 1.4 mg/dL — ABNORMAL HIGH (ref 0.6–1.1)
EGFR: 37 mL/min/{1.73_m2} — AB (ref >90)
Glucose: 94 mg/dl (ref 70–140)
POTASSIUM: 4.4 meq/L (ref 3.5–5.1)
SODIUM: 135 meq/L — AB (ref 136–145)
TOTAL PROTEIN: 6.6 g/dL (ref 6.4–8.3)

## 2017-07-09 LAB — CA 125: Cancer Antigen (CA) 125: 12712 U/mL — ABNORMAL HIGH (ref 0.0–38.1)

## 2017-07-10 ENCOUNTER — Other Ambulatory Visit: Payer: Self-pay | Admitting: Oncology

## 2017-07-14 ENCOUNTER — Other Ambulatory Visit: Payer: Self-pay | Admitting: *Deleted

## 2017-07-14 DIAGNOSIS — C569 Malignant neoplasm of unspecified ovary: Secondary | ICD-10-CM

## 2017-07-14 DIAGNOSIS — R1900 Intra-abdominal and pelvic swelling, mass and lump, unspecified site: Secondary | ICD-10-CM

## 2017-07-15 ENCOUNTER — Ambulatory Visit (HOSPITAL_BASED_OUTPATIENT_CLINIC_OR_DEPARTMENT_OTHER): Payer: Medicare Other | Admitting: Oncology

## 2017-07-15 ENCOUNTER — Ambulatory Visit (HOSPITAL_COMMUNITY)
Admission: RE | Admit: 2017-07-15 | Discharge: 2017-07-15 | Disposition: A | Discharge status: Home / Self Care | Payer: Medicare Other | Source: Ambulatory Visit | Attending: Oncology | Admitting: Oncology

## 2017-07-15 ENCOUNTER — Other Ambulatory Visit: Payer: Self-pay | Admitting: *Deleted

## 2017-07-15 VITALS — BP 139/77 | HR 105 | Temp 97.9°F | Resp 18 | Ht 59.0 in | Wt 117.1 lb

## 2017-07-15 DIAGNOSIS — C787 Secondary malignant neoplasm of liver and intrahepatic bile duct: Secondary | ICD-10-CM | POA: Diagnosis not present

## 2017-07-15 DIAGNOSIS — C772 Secondary and unspecified malignant neoplasm of intra-abdominal lymph nodes: Secondary | ICD-10-CM

## 2017-07-15 DIAGNOSIS — C569 Malignant neoplasm of unspecified ovary: Secondary | ICD-10-CM | POA: Diagnosis not present

## 2017-07-15 DIAGNOSIS — R0602 Shortness of breath: Secondary | ICD-10-CM | POA: Diagnosis not present

## 2017-07-15 DIAGNOSIS — R140 Abdominal distension (gaseous): Secondary | ICD-10-CM | POA: Diagnosis not present

## 2017-07-15 DIAGNOSIS — R0781 Pleurodynia: Secondary | ICD-10-CM | POA: Diagnosis not present

## 2017-07-15 DIAGNOSIS — R609 Edema, unspecified: Secondary | ICD-10-CM

## 2017-07-15 DIAGNOSIS — R180 Malignant ascites: Secondary | ICD-10-CM

## 2017-07-15 NOTE — Progress Notes (Signed)
ID: Cassandra Nichols   DOB: [birth year]  MR#: [id number]  FAO#:[id number]   PCP:  Patient, No Pcp Per GYN:  [name], MD OTHER:  [name], MD  CHIEF COMPLAINT:  Recurrent Ovarian Cancer  CURRENT TREATMENT: olaparib  NOTE: THIS PATIENT REQUESTS THAT ABSOLUTELY NO NUMERIC INFORMATION BE GIVEN REGARDING ANY LAB AND PARTICULARLY DOES NOT WANT TO KNOW SPECIFIC CA-125 RESULTS  INTERVAL HISTORY: Cassandra Nichols returns today for an unscheduled visit. She called to let us know that she had developed significant abdominal distention. This was causing her some lower rib pain and making it somewhat difficult to breathe. We have set her up for paracentesis later today.  She is tolerating olaparib well. She has had a little bit of reflux and started herself on low-dose Prilosec.  REVIEW OF SYSTEMS: Aside from the problems just noted, a detailed review of systems today was stable   HISTORY OF PRESENT ILLNESS: From the earlier summary note:   Cassandra Nichols was initially diagnosed with ovarian cancer in July of 2002. She underwent debulking surgery at that time for what proved to be a 4 of 18 lymph nodes positive disease. She was initially treated with 6 cycles of paclitaxel and carboplatin adjuvantly, completed in December of 2002.  Patient has had multiple recurrences since December of 2002, and has been treated with multiple agents, as detailed below.   PAST MEDICAL HISTORY: Past Medical History:  Diagnosis Date  . Cancer ([place])   . 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 Ovarian cancer ([place]) 11/17/2011  . Shingles     PAST SURGICAL HISTORY: Past Surgical History:  Procedure Laterality Date  . GANGLION CYST EXCISION Left    Wrist    FAMILY HISTORY Family History  Problem Relation Age of Onset  . Hypertension Mother   . GER disease Mother   . Cancer Father        Cancer (stomach)   the patient's father died at the age of 18, from cancer that may have it started in his abdomen. He had 8 siblings  including 2 sisters with breast cancer. The patient's mother had by Dr. cancer, but died from unrelated causes at the age of 63. The patient's maternal grandmother may have had ovarian cancer. The patient is an only child   GYNECOLOGIC HISTORY: Menarche age 20, first live birth age 89. The patient is GX P1. She went through the change of life around age 23. She subsequently had a TAH/BSO was part of her ovarian cancer treatment.  SOCIAL HISTORY: (Updated 03/08/2014) Cassandra Nichols lives alone. She works temporary jobs, primarily Nutritional therapist.  Her (adopted) daughter Cassandra Nichols lives [place] of [place] and is Mudlogger of the epidemiology and [place] there; she is married with 2 children. Son  Cassandra Nichols lives in [place], [place]. He has 4 children of his own. He is Tree surgeon for a [employer]. His wife is a Marine scientist.    ADVANCED DIRECTIVES: At the 01/16/2016 visit Cassandra Nichols tells me that her son Cassandra Nichols is her healthcare power of attorney. He can be reached at [phone]. [Her daughter Cassandra Nichols is her financial power of attorney.]  HEALTH MAINTENANCE:  (Updated  03/08/2014) Social History  Substance Use Topics  . Smoking status: Former Research scientist (life sciences)  . Smokeless tobacco: Never Used  . Alcohol use No     Comment: Occasionally     Colonoscopy:   PAP: s/p hysterectomy BSO  Bone density:   Mammogram:  09/16/2015   Allergies  Allergen Reactions  .  Penicillins Rash    Current Outpatient Prescriptions  Medication Sig Dispense Refill  . dexamethasone (DECADRON) 4 MG tablet Take 2 times a day with food the day before chemo, the day of chemo, and the day after chemo 30 tablet 4  . docusate sodium (COLACE) 100 MG capsule Take 100 mg by mouth 2 (two) times daily.    . fluticasone (FLONASE) 50 MCG/ACT nasal spray Place 1 spray into both nostrils as needed for allergies or rhinitis.    . folic acid (FOLVITE) 1 MG tablet Take 1 tablet (1 mg total) by mouth daily. 90 tablet 4  . hydrochlorothiazide (MICROZIDE) 12.5  MG capsule Take 2 capsules (25 mg total) by mouth daily. 90 capsule 3  . hydrochlorothiazide (MICROZIDE) 12.5 MG capsule TAKE ONE CAPSULE BY MOUTH DAILY 90 capsule 0  . lisinopril (PRINIVIL,ZESTRIL) 20 MG tablet Take 1 tablet (20 mg total) by mouth daily. 90 tablet 3  . loratadine (CLARITIN) 10 MG tablet Take 10 mg by mouth daily as needed for allergies.    . Melatonin 3 MG TABS Take 3 mg by mouth at bedtime as needed. Reported on 03/12/2016    . olaparib (LYNPARZA) 100 MG TABS Take 200 mg by mouth 2 (two) times daily. 120 tablet 6  . prochlorperazine (COMPAZINE) 10 MG tablet Take 1 tablet (10 mg total) by mouth every 6 (six) hours as needed for nausea or vomiting. 30 tablet 6  . vitamin B-12 (CYANOCOBALAMIN) 1000 MCG tablet Take 1 tablet (1,000 mcg total) by mouth daily. 90 tablet 4   No current facility-administered medications for this visit.     OBJECTIVE: Middle-aged white womanWho appears stated age  76:   07/15/17 1013  BP: 139/77  Pulse: (!) 105  Resp: 18  Temp: 97.9 F (36.6 C)     Body mass index is 23.65 kg/m.    ECOG FS: 2 Filed Weights   07/15/17 1013  Weight: 117 lb 1.6 oz (53.1 kg)    Sclerae unicteric, EOMs intact Oropharynx clear and moist No cervical or supraclavicular adenopathy Lungs no rales or rhonchi Heart regular rate and rhythm Abd soft, distended, nontender, positive bowel sounds MSK no focal spinal tenderness, grade 1 bilateral lower extremity edema Neuro: nonfocal, well oriented, appropriate affect Breasts: Deferred  LAB RESULTS:   Lab Results  Component Value Date   WBC 4.5 07/08/2017   NEUTROABS 3.0 07/08/2017   HGB 10.3 (L) 07/08/2017   HCT 31.7 (L) 07/08/2017   MCV 89.2 07/08/2017   PLT 167 07/08/2017        Chemistry      Component Value Date/Time   NA 135 (L) 07/08/2017 1007   K 4.4 07/08/2017 1007   CL 106 09/29/2016 1343   CL 106 05/09/2013 1304   CO2 22 07/08/2017 1007   BUN 23.2 07/08/2017 1007   CREATININE 1.4 (H)  07/08/2017 1007      Component Value Date/Time   CALCIUM 9.3 07/08/2017 1007   ALKPHOS 74 07/08/2017 1007   AST 19 07/08/2017 1007   ALT 7 07/08/2017 1007   BILITOT 0.50 07/08/2017 1007      STUDIES: The CA 125 continues to rise, most recent reading greater than 12,000  ASSESSMENT: 76 y.o.  [place] woman (1)  with a history of ovarian cancer dating back to July 2002 when she underwent debulking surgery for what proved to be 4/18 lymph node positive disease.   (2)  She received 6 cycles of paclitaxel/carboplatin adjuvantly completed in December 2002.   (  3)  She had her first recurrence in December 2005 treated with paclitaxel;   (4)  second recurrence May 2007 treated with [place] and paclitaxel;   (5)  third recurrence January 2010 treated with topotecan and Gemzar;   (6)  fourth recurrence January 2012 treated with single-agent carboplatin (January to August 2012) with initial response, then plateau; with progression January 2013 (less than 6 months after last carbo dose) treated with single-agent gemzar given Q2 weeks (January to April 2013), with evidence of progression  (7) Doxil strartedJuly 2013 6, with evidence of initial response, and fair tolerance. Held between May and August 2014 due to patient's work schedule and scheduling issues.  Resumed treatment as of 08/24/2013, with some delays secondary to the patient's schedule. As of early December 2014, her total cumulative dose of Doxil reached 560 mg per meter square. In addition there was also an upward trend of the CA 125. Doxil was discontinued (last dose 10/30/2013). Echocardiogram on 11/14/2013 showed a well preserved ejection fraction of 55-60%.  (8)  treated with single agent paclitaxel, given on days one and 8 of each 21 day cycle, first dose on 12/27/2013,with continuing response, but mild to moderate toxicity; 11th and last dose 04/12/2014   (9) carboplatin and cyclophosphamide started on 08/28/14 given every 21 days--  stopped 10/10/2014 after a reaction to carboplatin (also no evidence of response)  (10) cyclophosphamide and bevacizumab started 12/02/2014, stopped 12/26/2015 with progression  (a) CT scan of the abdomen and pelvis 06/19/2015 shows stable disease  (b) CT of the chest/abd/pelvis 09/12/2015 stable  (c) PET scan 01/15/2016 shows progression in the liver and upper abdominal nodes  (11) abraxane started 01/16/2016,  repeated every 14 days with evidence of response  (a) last dose 06/25/2016, discontinued because of neuropathy  (12) offered genetics testing at the 01/16/2016 visit, but declined  (13) started pemetrexed 08/27/2016  (a) cycle 2 delayed 2 weeks because of the patient's travel schedule   (b) stopped after cycle 5, given 12/03/2016, because of a continuing rise in the CA-125  (14) started topotecan 12/24/2016 given every 14 days with bevacizumab given every 14 days  (a) third dose (cycle 2 dose 1) omitted because of patient's travel  (b) fourth dose delayed 2 weeks for similar reasons  (c) discontinued after 04/08/2017 dose with evidence of progression  (15) started olaparib May 2018  (16) developed significant ascites 07/15/2017  (a) status post paracentesis 07/15/2017, 4.1 L serous fluid, cytology pending    PLAN:  Cassandra Nichols is now 13 years out from her initial recurrence of ovarian cancer. She has had a remarkable course, with very little symptomatology until now.  At the last visit here we discussed the fact that I really had no more treatments likely to be helpful. We started her on olaparib even though she did not agree to genetics testing. Accordingly the possibility of olaparib being helpful in this situation is very low, in the 5% range.  Cassandra Nichols has made it very clear that she does not want any numeric information and she became very alarmed at the 5% number which she tells me he is going to make her upsets. I apologize for the slip.  Nevertheless the fact is that she  has a very low probability of benefiting from olaparib and we need to start making plans to consider palliative and comfort care. She has led her family know what's going on but not completely. She does not feel it is time for them to come  visit or meet with me. I confirmed that her son is her healthcare power of attorney.  She did not want to hear about possible problems such as bowel obstruction, so I gave her a list of things that she needs to call me if they happen, including inability to have bowel movements, inability to urinate, or turning yellow.  We discussed a low salt diet. She is already on a mild diuretic which will help somewhat with the peripheral edema. I suggested compression stockings for the ankle swelling.  If she requires very frequent paracentesis she will need a Pleurx catheter placed.  She is going to continue on olaparib and see me late August. If there has been no evidence of response we will discontinue the olaparib and place a hospice referral assuming she agrees.  She knows to call for any other problems that may develop before then.    Chauncey Cruel, MD    07/16/2017

## 2017-07-15 NOTE — Procedures (Signed)
Ultrasound-guided diagnostic and therapeutic paracentesis performed yielding 4.1 liters of serous colored fluid. No immediate complications.  Conard Alvira E 1:46 PM 07/15/2017

## 2017-07-25 ENCOUNTER — Telehealth: Payer: Self-pay

## 2017-07-25 NOTE — Telephone Encounter (Signed)
Pt called stating that at this time she does not want any information to be shared with her children [place] and [place].  This information will be given to Dr Jana Hakim.

## 2017-08-04 ENCOUNTER — Other Ambulatory Visit: Payer: Self-pay | Admitting: *Deleted

## 2017-08-04 ENCOUNTER — Telehealth: Payer: Self-pay | Admitting: *Deleted

## 2017-08-04 DIAGNOSIS — R180 Malignant ascites: Secondary | ICD-10-CM

## 2017-08-04 DIAGNOSIS — C569 Malignant neoplasm of unspecified ovary: Secondary | ICD-10-CM

## 2017-08-04 NOTE — Telephone Encounter (Signed)
Pt called this RN stating she would like to schedule a paracentesis for tomorrow if possible.  This RN obtained an appointment at [facility] at 77 for 08/04/2017 - Kerstyn Coryell aware of above and verbalized understanding.  Order sent to MD for signature.

## 2017-08-05 ENCOUNTER — Other Ambulatory Visit: Payer: Medicare Other

## 2017-08-05 ENCOUNTER — Ambulatory Visit (HOSPITAL_COMMUNITY)
Admission: RE | Admit: 2017-08-05 | Discharge: 2017-08-05 | Disposition: A | Discharge status: Home / Self Care | Payer: Medicare Other | Source: Ambulatory Visit | Attending: Oncology | Admitting: Oncology

## 2017-08-05 ENCOUNTER — Encounter (HOSPITAL_COMMUNITY): Payer: Self-pay | Admitting: General Surgery

## 2017-08-05 DIAGNOSIS — R180 Malignant ascites: Secondary | ICD-10-CM | POA: Diagnosis not present

## 2017-08-05 DIAGNOSIS — C569 Malignant neoplasm of unspecified ovary: Secondary | ICD-10-CM | POA: Diagnosis not present

## 2017-08-05 HISTORY — PX: IR PARACENTESIS: IMG2679

## 2017-08-05 MED ORDER — LIDOCAINE HCL (PF) 1 % IJ SOLN
INTRAMUSCULAR | Status: AC
  Filled 2017-08-05: qty 30

## 2017-08-05 MED ORDER — LIDOCAINE HCL 1 % IJ SOLN
INTRAMUSCULAR | Status: DC | PRN
  Administered 2017-08-05: 10 mL

## 2017-08-05 NOTE — Procedures (Signed)
Ultrasound-guided therapeutic paracentesis performed yielding 3.8 liters of serous colored fluid. No immediate complications.  Cassandra Nichols E 12:05 PM 08/05/2017

## 2017-08-12 ENCOUNTER — Ambulatory Visit (HOSPITAL_BASED_OUTPATIENT_CLINIC_OR_DEPARTMENT_OTHER): Payer: Medicare Other | Admitting: Oncology

## 2017-08-12 ENCOUNTER — Other Ambulatory Visit (HOSPITAL_BASED_OUTPATIENT_CLINIC_OR_DEPARTMENT_OTHER): Payer: Medicare Other

## 2017-08-12 VITALS — BP 157/85 | HR 103 | Temp 97.6°F | Resp 18 | Ht 59.0 in | Wt 106.8 lb

## 2017-08-12 DIAGNOSIS — C772 Secondary and unspecified malignant neoplasm of intra-abdominal lymph nodes: Secondary | ICD-10-CM

## 2017-08-12 DIAGNOSIS — R188 Other ascites: Secondary | ICD-10-CM | POA: Diagnosis not present

## 2017-08-12 DIAGNOSIS — C787 Secondary malignant neoplasm of liver and intrahepatic bile duct: Secondary | ICD-10-CM | POA: Diagnosis not present

## 2017-08-12 DIAGNOSIS — C569 Malignant neoplasm of unspecified ovary: Secondary | ICD-10-CM

## 2017-08-12 LAB — COMPREHENSIVE METABOLIC PANEL
ALK PHOS: 74 U/L (ref 40–150)
ALT: 7 U/L (ref 0–55)
AST: 17 U/L (ref 5–34)
Albumin: 2.6 g/dL — ABNORMAL LOW (ref 3.5–5.0)
Anion Gap: 8 mEq/L (ref 3–11)
BILIRUBIN TOTAL: 0.51 mg/dL (ref 0.20–1.20)
BUN: 21.4 mg/dL (ref 7.0–26.0)
CO2: 24 mEq/L (ref 22–29)
CREATININE: 1.2 mg/dL — AB (ref 0.6–1.1)
Calcium: 9.8 mg/dL (ref 8.4–10.4)
Chloride: 100 mEq/L (ref 98–109)
EGFR: 46 mL/min/{1.73_m2} — AB (ref >90)
GLUCOSE: 109 mg/dL (ref 70–140)
Potassium: 4.9 mEq/L (ref 3.5–5.1)
SODIUM: 132 meq/L — AB (ref 136–145)
Total Protein: 6.4 g/dL (ref 6.4–8.3)

## 2017-08-12 LAB — CBC WITH DIFFERENTIAL/PLATELET
BASO%: 0.5 % (ref 0.0–2.0)
BASOS ABS: 0 10*3/uL (ref 0.0–0.1)
EOS ABS: 0 10*3/uL (ref 0.0–0.5)
EOS%: 0.1 % (ref 0.0–7.0)
HCT: 34.6 % — ABNORMAL LOW (ref 34.8–46.6)
HGB: 11.4 g/dL — ABNORMAL LOW (ref 11.6–15.9)
LYMPH%: 21.6 % (ref 14.0–49.7)
MCH: 29.4 pg (ref 25.1–34.0)
MCHC: 32.9 g/dL (ref 31.5–36.0)
MCV: 89.4 fL (ref 79.5–101.0)
MONO#: 0.4 10*3/uL (ref 0.1–0.9)
MONO%: 6.9 % (ref 0.0–14.0)
NEUT#: 4.2 10*3/uL (ref 1.5–6.5)
NEUT%: 70.9 % (ref 38.4–76.8)
Platelets: 163 10*3/uL (ref 145–400)
RBC: 3.87 10*6/uL (ref 3.70–5.45)
RDW: 22.5 % — ABNORMAL HIGH (ref 11.2–14.5)
WBC: 5.9 10*3/uL (ref 3.9–10.3)
lymph#: 1.3 10*3/uL (ref 0.9–3.3)

## 2017-08-12 NOTE — Progress Notes (Addendum)
ID: Cassandra Nichols   DOB: [birth year]  MR#: [id number]  VOH#:[id number]   PCP:  Patient, No Pcp Per GYN:  Cassandra Laroche, MD OTHER:  Cassandra Peach, MD  CHIEF COMPLAINT:  Recurrent Ovarian Cancer  CURRENT TREATMENT: olaparib  NOTE: THIS PATIENT REQUESTS THAT ABSOLUTELY NO NUMERIC INFORMATION BE GIVEN REGARDING ANY LAB AND PARTICULARLY DOES NOT WANT TO KNOW SPECIFIC CA-125 RESULTS  INTERVAL HISTORY: Cassandra Nichols returns today for follow-up and treatment of her metastatic ovarian cancer accompanied by 2 friends. Since her last visit here first of all we got the cytology from her peritoneal fluid drawn 07/15/2017. This confirmed malignant cells (in ZB [id number]). Also since the last visit here she required a second paracentesis, on 08/05/2017. She tolerated that well.  She continues on olaparib. She tolerates that well. She has mild fatigue associated with it.  REVIEW OF SYSTEMS: Cassandra Nichols ascites is increased she has shortness of breath, more fatigue, and more discomfort. After the paracentesis she feels more normal. She denies unusual headaches, cough, phlegm production, or pleurisy, or any change in bowel or bladder habits. A detailed review of systems was otherwise stable   HISTORY OF PRESENT ILLNESS: From the earlier summary note:   Cassandra Nichols was initially diagnosed with ovarian cancer in July of 2002. She underwent debulking surgery at that time for what proved to be a 4 of 18 lymph nodes positive disease. She was initially treated with 6 cycles of paclitaxel and carboplatin adjuvantly, completed in December of 2002.  Patient has had multiple recurrences since December of 2002, and has been treated with multiple agents, as detailed below.   PAST MEDICAL HISTORY: Past Medical History:  Diagnosis Date  . Cancer ([place])   . 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 Ovarian cancer ([place]) 11/17/2011  . Shingles     PAST SURGICAL HISTORY: Past Surgical History:  Procedure Laterality Date  . GANGLION  CYST EXCISION Left    Wrist  . IR PARACENTESIS  08/05/2017    FAMILY HISTORY Family History  Problem Relation Age of Onset  . Hypertension Mother   . GER disease Mother   . Cancer Father        Cancer (stomach)   the patient's father died at the age of 36, from cancer that may have it started in his abdomen. He had 8 siblings including 2 sisters with breast cancer. The patient's mother had by Dr. cancer, but died from unrelated causes at the age of 80. The patient's maternal grandmother may have had ovarian cancer. The patient is an only child   GYNECOLOGIC HISTORY: Menarche age 89, first live birth age 49. The patient is GX P1. She went through the change of life around age 87. She subsequently had a TAH/BSO was part of her ovarian cancer treatment.  SOCIAL HISTORY: (Updated 03/08/2014) Cassandra Nichols lives alone. She works temporary jobs, primarily Nutritional therapist.  Her (adopted) daughter Cassandra Nichols lives [place] and is Mudlogger of the epidemiology and [place] there; she is married with 2 children. Son  Cassandra Nichols lives in [place], [place]. He has 4 children of his own. He is Tree surgeon for a [employer]. His wife is a Marine scientist.    ADVANCED DIRECTIVES: At the 01/16/2016 visit Cassandra Nichols tells me that her son Cassandra Nichols is her healthcare power of attorney. He can be reached at [phone]. [Her daughter Cassandra Nichols is her financial power of attorney.]  HEALTH MAINTENANCE:  (Updated  03/08/2014) Social History  Substance Use  Topics  . Smoking status: Former Research scientist (life sciences)  . Smokeless tobacco: Never Used  . Alcohol use No     Comment: Occasionally     Colonoscopy:   PAP: s/p hysterectomy BSO  Bone density:   Mammogram:  09/16/2015   Allergies  Allergen Reactions  . Penicillins Rash    Current Outpatient Prescriptions  Medication Sig Dispense Refill  . dexamethasone (DECADRON) 4 MG tablet Take 2 times a day with food the day before chemo, the day of chemo, and the day after chemo 30 tablet 4  .  docusate sodium (COLACE) 100 MG capsule Take 100 mg by mouth 2 (two) times daily.    . fluticasone (FLONASE) 50 MCG/ACT nasal spray Place 1 spray into both nostrils as needed for allergies or rhinitis.    . folic acid (FOLVITE) 1 MG tablet Take 1 tablet (1 mg total) by mouth daily. 90 tablet 4  . hydrochlorothiazide (MICROZIDE) 12.5 MG capsule Take 2 capsules (25 mg total) by mouth daily. 90 capsule 3  . hydrochlorothiazide (MICROZIDE) 12.5 MG capsule TAKE ONE CAPSULE BY MOUTH DAILY 90 capsule 0  . lisinopril (PRINIVIL,ZESTRIL) 20 MG tablet Take 1 tablet (20 mg total) by mouth daily. 90 tablet 3  . loratadine (CLARITIN) 10 MG tablet Take 10 mg by mouth daily as needed for allergies.    . Melatonin 3 MG TABS Take 3 mg by mouth at bedtime as needed. Reported on 03/12/2016    . olaparib (LYNPARZA) 100 MG TABS Take 200 mg by mouth 2 (two) times daily. 120 tablet 6  . prochlorperazine (COMPAZINE) 10 MG tablet Take 1 tablet (10 mg total) by mouth every 6 (six) hours as needed for nausea or vomiting. 30 tablet 6  . vitamin B-12 (CYANOCOBALAMIN) 1000 MCG tablet Take 1 tablet (1,000 mcg total) by mouth daily. 90 tablet 4   No current facility-administered medications for this visit.     OBJECTIVE: Middle-aged white woman in no acute distress  Vitals:   08/12/17 1133  BP: (!) 157/85  Pulse: (!) 103  Resp: 18  Temp: 97.6 F (36.4 C)  SpO2: 100%     Body mass index is 21.57 kg/m.    ECOG FS: 2 Filed Weights   08/12/17 1133  Weight: 106 lb 12.8 oz (48.4 kg)    Sclerae unicteric, pupils round and equal No cervical or supraclavicular adenopathy Lungs no rales or rhonchi Heart regular rate and rhythm Abd soft, nontender, not particularly distended, positive bowel sounds, no masses palpated MSK no focal spinal tenderness, no upper extremity lymphedema Neuro: nonfocal, well oriented, appropriate affect Breasts: Deferred   LAB RESULTS:   Lab Results  Component Value Date   WBC 5.9  08/12/2017   NEUTROABS 4.2 08/12/2017   HGB 11.4 (L) 08/12/2017   HCT 34.6 (L) 08/12/2017   MCV 89.4 08/12/2017   PLT 163 08/12/2017        Chemistry      Component Value Date/Time   NA 135 (L) 07/08/2017 1007   K 4.4 07/08/2017 1007   CL 106 09/29/2016 1343   CL 106 05/09/2013 1304   CO2 22 07/08/2017 1007   BUN 23.2 07/08/2017 1007   CREATININE 1.4 (H) 07/08/2017 1007      Component Value Date/Time   CALCIUM 9.3 07/08/2017 1007   ALKPHOS 74 07/08/2017 1007   AST 19 07/08/2017 1007   ALT 7 07/08/2017 1007   BILITOT 0.50 07/08/2017 1007      STUDIES: Repeat CA-125 today is pending  ASSESSMENT: 76 y.o.   woman (1)  with a history of ovarian cancer dating back to July 2002 when she underwent debulking surgery for what proved to be 4/18 lymph node positive disease.   (2)  She received 6 cycles of paclitaxel/carboplatin adjuvantly completed in December 2002.   (3)  She had her first recurrence in December 2005 treated with paclitaxel;   (4)  second recurrence May 2007 treated with [place] and paclitaxel;   (5)  third recurrence January 2010 treated with topotecan and Gemzar;   (6)  fourth recurrence January 2012 treated with single-agent carboplatin (January to August 2012) with initial response, then plateau; with progression January 2013 (less than 6 months after last carbo dose) treated with single-agent gemzar given Q2 weeks (January to April 2013), with evidence of progression  (7) Doxil strartedJuly 2013 6, with evidence of initial response, and fair tolerance. Held between May and August 2014 due to patient's work schedule and scheduling issues.  Resumed treatment as of 08/24/2013, with some delays secondary to the patient's schedule. As of early December 2014, her total cumulative dose of Doxil reached 560 mg per meter square. In addition there was also an upward trend of the CA 125. Doxil was discontinued (last dose 10/30/2013). Echocardiogram on 11/14/2013  showed a well preserved ejection fraction of 55-60%.  (8)  treated with single agent paclitaxel, given on days one and 8 of each 21 day cycle, first dose on 12/27/2013,with continuing response, but mild to moderate toxicity; 11th and last dose 04/12/2014   (9) carboplatin and cyclophosphamide started on 08/28/14 given every 21 days-- stopped 10/10/2014 after a reaction to carboplatin (also no evidence of response)  (10) cyclophosphamide and bevacizumab started 12/02/2014, stopped 12/26/2015 with progression  (a) CT scan of the abdomen and pelvis 06/19/2015 shows stable disease  (b) CT of the chest/abd/pelvis 09/12/2015 stable  (c) PET scan 01/15/2016 shows progression in the liver and upper abdominal nodes  (11) abraxane started 01/16/2016,  repeated every 14 days with evidence of response  (a) last dose 06/25/2016, discontinued because of neuropathy  (12) offered genetics testing at the 01/16/2016 visit, but declined  (13) started pemetrexed 08/27/2016  (a) cycle 2 delayed 2 weeks because of the patient's travel schedule   (b) stopped after cycle 5, given 12/03/2016, because of a continuing rise in the CA-125  (14) started topotecan 12/24/2016 given every 14 days with bevacizumab given every 14 days  (a) third dose (cycle 2 dose 1) omitted because of patient's travel  (b) fourth dose delayed 2 weeks for similar reasons  (c) discontinued after 04/08/2017 dose with evidence of progression  (15) started olaparib May 2018  (16) developed significant ascites 07/15/2017  (a) status post paracentesis 07/15/2017, 4.1 L serous fluid, cytology positive  (b) repeat paracentesis 08/05/2017    PLAN:  Cassandra Nichols is 16 years out from initial diagnosis of her ovarian cancer and 13 years out from her initial recurrence. She is currently on olaparib, which she tolerates well. However she would not allow [place] to obtain BRCA testing and therefore it is doubtful that this drug will be useful. We are going to  use today's CEA 125 report to decide whether it is working or not. If it is increased we will discontinue that and moved to a purely palliative approach  At the last visit I made a mistake of measuring that the chance of response to olaparib was 5%. At this visit I made a mistake of saying that she had  stage IV disease. She tells me she did not know she had stage IV disease. She tells me any kind of numeric problem causes her to obsess. Also my description of her peritoneal disease was a source of distress for her as well. She obsess is about these images and numbers and it makes it difficult for her to make decisions.  In short she would like my communications to be a little bit on the Gen.'s side, to be as positive as possible, to be entirely non-numeric, and not be visually oriented. This is important because she is going to be calling her children as soon as we have the CA-125 report. If it is worse as I expect, she will go off the olaparib and she will try to set up a meeting for them with me, with her present.  I discussed with her and her friends my concern that her children really need to know what is going on so they can start making plans. Note that the patient's daughter is Mudlogger of genetics research in a facility in [place] and the patient's son's wife is an Therapist, sports. They're both very medically oriented. They certainly are able to get into the Internet and look up information from appropriate sites.  However the patient is the one who controls the flow of information. I promised to do my best to let the children know what they need to now without going over Cassandra Nichols parameters  I also asked her to try to think and advance whether she would like [place] to place a Pleurx at the next paracentesis so that she can have the fluid drained at home at your convenience and does not have to come in here for a specific procedure.  At this point I am not making a return appointment for her because I do not  know when the family visited may take place. She knows to call for any problems that may develop before that time     Chauncey Cruel, MD    08/12/2017    ADDENDUM: Cassandra Nichols copies of her advanced directives documents, which she completed and notarize. As healthcare power of attorney she underwent her son Cassandra Nichols and his he is not reasonably available we would contact her Daughter Cassandra Nichols. In her living will she indicated that if her attending physician determines that she has an incurable or enters reversible condition that will result in her death and is short. Of time or she becomes unconscious and if she is not expected to regain consciousness or she suffers from advanced dementia, then life-prolonging measures may be withheld. She crossed out artificial nutrition or hydration. She wishes to be as comfortable as possible. And she notes that her healthcare agent has authority to override this advance directive.  Incidentally the names of her friends present today were Cassandra Nichols and Cassandra Nichols

## 2017-08-13 LAB — CA 125: Cancer Antigen (CA) 125: 10685 U/mL — ABNORMAL HIGH (ref 0.0–38.1)

## 2017-08-16 ENCOUNTER — Other Ambulatory Visit: Payer: Self-pay

## 2017-08-16 ENCOUNTER — Telehealth: Payer: Self-pay | Admitting: Oncology

## 2017-08-16 NOTE — Telephone Encounter (Signed)
Left voicemail for patient regarding the changes in their schedule.   Sending them a reminder letter in the mail.

## 2017-08-17 ENCOUNTER — Other Ambulatory Visit: Payer: Self-pay | Admitting: Oncology

## 2017-08-17 ENCOUNTER — Telehealth: Payer: Self-pay | Admitting: Pharmacist

## 2017-08-17 ENCOUNTER — Encounter (HOSPITAL_COMMUNITY): Payer: Self-pay | Admitting: General Surgery

## 2017-08-17 ENCOUNTER — Ambulatory Visit (HOSPITAL_COMMUNITY)
Admission: RE | Admit: 2017-08-17 | Discharge: 2017-08-17 | Disposition: A | Discharge status: Home / Self Care | Payer: Medicare Other | Source: Ambulatory Visit | Attending: Oncology | Admitting: Oncology

## 2017-08-17 DIAGNOSIS — R180 Malignant ascites: Secondary | ICD-10-CM | POA: Diagnosis present

## 2017-08-17 DIAGNOSIS — R1900 Intra-abdominal and pelvic swelling, mass and lump, unspecified site: Secondary | ICD-10-CM

## 2017-08-17 DIAGNOSIS — C569 Malignant neoplasm of unspecified ovary: Secondary | ICD-10-CM

## 2017-08-17 DIAGNOSIS — Z8543 Personal history of malignant neoplasm of ovary: Secondary | ICD-10-CM | POA: Diagnosis not present

## 2017-08-17 HISTORY — PX: IR PARACENTESIS: IMG2679

## 2017-08-17 MED ORDER — LIDOCAINE HCL (PF) 1 % IJ SOLN
INTRAMUSCULAR | Status: AC
  Filled 2017-08-17: qty 10

## 2017-08-17 MED ORDER — LIDOCAINE HCL (PF) 1 % IJ SOLN
INTRAMUSCULAR | Status: DC | PRN
  Administered 2017-08-17: 10 mL

## 2017-08-17 NOTE — Telephone Encounter (Signed)
Oral Chemotherapy Pharmacist Encounter  Follow-Up Form  Called patient today to follow up regarding patient's oral chemotherapy medication: Lynpraza for the maintenance treatment of recurrent ovarian cancer, in response from treatment, until disease progression or unacceptable toxicity  Original Start date of oral chemotherapy: 06/23/17  Pt is doing well today. She inquires about medication refill from [facility], she states she has enough medication left for 10 days.  I inquired how patient was taking her medication and she stated 1 pill in the morning and 1 pill in the evening.  Upon further investigation, patient is not taking her medication as it is prescribed. She is currently take Lynparza 100mg  tablets, 1 tablet by mouth twice daily with food. Patient's bottle states to take 2 tablets (200mg ) by mouth BID, she has mis-read it.  Pt reports 0 tablets/doses of Lynparza 100mg  BID missed in the last month, however patient now understands she should be taking 2 tablets (200mg ) BID for 4 total tablets daily.   Pt reports the following side effects: mild fatigue  Pertinent labs reviewed: OK for continued treatment.  Patient states she going out of town for the weekend and will wait until Monday 08/22/17 to increase to prescribed dosing.  I called [facility] and initiated a refill for the patient that should arrive in 3-5 business days.  Patient knows to call the office with questions or concerns. [facility] will continue to follow.  Thank you,  Johny Drilling, PharmD, BCPS, BCOP 08/17/2017 11:34 AM [facility] [phone]

## 2017-08-17 NOTE — Procedures (Signed)
Ultrasound-guided therapeutic paracentesis performed yielding 2.3 liters of serous colored fluid. No immediate complications.  Saliou Barnier E 3:38 PM 08/17/2017

## 2017-08-24 ENCOUNTER — Other Ambulatory Visit: Payer: Self-pay

## 2017-08-24 MED ORDER — LISINOPRIL 20 MG PO TABS: 20.0000 mg | ORAL_TABLET | Freq: Every day | ORAL | 1 refills | Status: AC

## 2017-08-24 MED ORDER — PROCHLORPERAZINE MALEATE 10 MG PO TABS: 10.0000 mg | ORAL_TABLET | Freq: Four times a day (QID) | ORAL | 3 refills | Status: AC | PRN

## 2017-08-24 MED ORDER — HYDROCHLOROTHIAZIDE 12.5 MG PO CAPS: 12.5000 mg | ORAL_CAPSULE | Freq: Every day | ORAL | 1 refills | Status: DC

## 2017-08-25 ENCOUNTER — Other Ambulatory Visit: Payer: Self-pay

## 2017-08-25 MED ORDER — HYDROCHLOROTHIAZIDE 12.5 MG PO CAPS: 25.0000 mg | ORAL_CAPSULE | Freq: Every day | ORAL | 3 refills | Status: AC

## 2017-09-02 ENCOUNTER — Other Ambulatory Visit: Payer: Medicare Other

## 2017-09-02 ENCOUNTER — Other Ambulatory Visit (HOSPITAL_BASED_OUTPATIENT_CLINIC_OR_DEPARTMENT_OTHER): Payer: Medicare Other

## 2017-09-02 DIAGNOSIS — C569 Malignant neoplasm of unspecified ovary: Secondary | ICD-10-CM

## 2017-09-02 LAB — COMPREHENSIVE METABOLIC PANEL
ALT: 6 U/L (ref 0–55)
AST: 18 U/L (ref 5–34)
Albumin: 2.5 g/dL — ABNORMAL LOW (ref 3.5–5.0)
Alkaline Phosphatase: 84 U/L (ref 40–150)
Anion Gap: 10 mEq/L (ref 3–11)
BUN: 37.3 mg/dL — AB (ref 7.0–26.0)
CALCIUM: 9.2 mg/dL (ref 8.4–10.4)
CHLORIDE: 98 meq/L (ref 98–109)
CO2: 20 mEq/L — ABNORMAL LOW (ref 22–29)
CREATININE: 1.7 mg/dL — AB (ref 0.6–1.1)
EGFR: 29 mL/min/{1.73_m2} — ABNORMAL LOW (ref >90)
GLUCOSE: 107 mg/dL (ref 70–140)
Potassium: 4.7 mEq/L (ref 3.5–5.1)
Sodium: 129 mEq/L — ABNORMAL LOW (ref 136–145)
Total Bilirubin: 0.39 mg/dL (ref 0.20–1.20)
Total Protein: 6.1 g/dL — ABNORMAL LOW (ref 6.4–8.3)

## 2017-09-02 LAB — CBC WITH DIFFERENTIAL/PLATELET
BASO%: 0.7 % (ref 0.0–2.0)
Basophils Absolute: 0 10*3/uL (ref 0.0–0.1)
EOS%: 0 % (ref 0.0–7.0)
Eosinophils Absolute: 0 10*3/uL (ref 0.0–0.5)
HEMATOCRIT: 29.3 % — AB (ref 34.8–46.6)
HEMOGLOBIN: 9.8 g/dL — AB (ref 11.6–15.9)
LYMPH%: 21 % (ref 14.0–49.7)
MCH: 31 pg (ref 25.1–34.0)
MCHC: 33.6 g/dL (ref 31.5–36.0)
MCV: 92.2 fL (ref 79.5–101.0)
MONO#: 0.3 10*3/uL (ref 0.1–0.9)
MONO%: 6.1 % (ref 0.0–14.0)
NEUT%: 72.2 % (ref 38.4–76.8)
NEUTROS ABS: 3.8 10*3/uL (ref 1.5–6.5)
Platelets: 124 10*3/uL — ABNORMAL LOW (ref 145–400)
RBC: 3.17 10*6/uL — ABNORMAL LOW (ref 3.70–5.45)
RDW: 24.3 % — AB (ref 11.2–14.5)
WBC: 5.3 10*3/uL (ref 3.9–10.3)
lymph#: 1.1 10*3/uL (ref 0.9–3.3)

## 2017-09-05 ENCOUNTER — Other Ambulatory Visit: Payer: Self-pay | Admitting: *Deleted

## 2017-09-05 ENCOUNTER — Other Ambulatory Visit: Payer: Self-pay | Admitting: Oncology

## 2017-09-05 DIAGNOSIS — R180 Malignant ascites: Secondary | ICD-10-CM

## 2017-09-05 DIAGNOSIS — C569 Malignant neoplasm of unspecified ovary: Secondary | ICD-10-CM

## 2017-09-05 LAB — CA 125: Cancer Antigen (CA) 125: 10464 U/mL — ABNORMAL HIGH (ref 0.0–38.1)

## 2017-09-06 ENCOUNTER — Ambulatory Visit (HOSPITAL_COMMUNITY)
Admission: RE | Admit: 2017-09-06 | Discharge: 2017-09-06 | Disposition: A | Discharge status: Home / Self Care | Payer: Medicare Other | Source: Ambulatory Visit | Attending: Oncology | Admitting: Oncology

## 2017-09-06 DIAGNOSIS — R180 Malignant ascites: Secondary | ICD-10-CM

## 2017-09-06 DIAGNOSIS — C569 Malignant neoplasm of unspecified ovary: Secondary | ICD-10-CM | POA: Diagnosis not present

## 2017-09-06 MED ORDER — LIDOCAINE HCL 2 % IJ SOLN
INTRAMUSCULAR | Status: AC
  Filled 2017-09-06: qty 10

## 2017-09-06 NOTE — Procedures (Signed)
Ultrasound-guided therapeutic paracentesis performed yielding 3.2 liters of serous colored fluid. No immediate complications.  Mignonne Afonso E 11:04 AM 09/06/2017

## 2017-09-19 ENCOUNTER — Telehealth: Payer: Self-pay | Admitting: *Deleted

## 2017-09-19 ENCOUNTER — Other Ambulatory Visit: Payer: Self-pay | Admitting: *Deleted

## 2017-09-19 DIAGNOSIS — C569 Malignant neoplasm of unspecified ovary: Secondary | ICD-10-CM

## 2017-09-19 DIAGNOSIS — R180 Malignant ascites: Secondary | ICD-10-CM

## 2017-09-19 NOTE — Telephone Encounter (Signed)
This RN arranged time for therapeutic paracentesis for 09/20/2017 per pt's call stating need due to increased discomfort.

## 2017-09-20 ENCOUNTER — Ambulatory Visit (HOSPITAL_COMMUNITY)
Admission: RE | Admit: 2017-09-20 | Discharge: 2017-09-20 | Disposition: A | Discharge status: Home / Self Care | Payer: Medicare Other | Source: Ambulatory Visit | Attending: Adult Health | Admitting: Adult Health

## 2017-09-20 DIAGNOSIS — R180 Malignant ascites: Secondary | ICD-10-CM | POA: Diagnosis present

## 2017-09-20 DIAGNOSIS — C569 Malignant neoplasm of unspecified ovary: Secondary | ICD-10-CM | POA: Insufficient documentation

## 2017-09-20 MED ORDER — LIDOCAINE HCL 2 % IJ SOLN
INTRAMUSCULAR | Status: AC
  Filled 2017-09-20: qty 10

## 2017-09-20 NOTE — Procedures (Signed)
Ultrasound-guided  therapeutic paracentesis performed yielding 4 liters of yellow fluid. No immediate complications.

## 2017-09-26 ENCOUNTER — Other Ambulatory Visit: Payer: Self-pay | Admitting: *Deleted

## 2017-09-26 ENCOUNTER — Other Ambulatory Visit (HOSPITAL_BASED_OUTPATIENT_CLINIC_OR_DEPARTMENT_OTHER): Payer: Medicare Other

## 2017-09-26 DIAGNOSIS — C569 Malignant neoplasm of unspecified ovary: Secondary | ICD-10-CM

## 2017-09-26 DIAGNOSIS — C787 Secondary malignant neoplasm of liver and intrahepatic bile duct: Secondary | ICD-10-CM | POA: Diagnosis not present

## 2017-09-26 LAB — CBC WITH DIFFERENTIAL/PLATELET
BASO%: 0.1 % (ref 0.0–2.0)
BASOS ABS: 0 10*3/uL (ref 0.0–0.1)
EOS ABS: 0 10*3/uL (ref 0.0–0.5)
EOS%: 0 % (ref 0.0–7.0)
HEMATOCRIT: 28.1 % — AB (ref 34.8–46.6)
HEMOGLOBIN: 9.6 g/dL — AB (ref 11.6–15.9)
LYMPH#: 1 10*3/uL (ref 0.9–3.3)
LYMPH%: 13.8 % — ABNORMAL LOW (ref 14.0–49.7)
MCH: 33 pg (ref 25.1–34.0)
MCHC: 34.2 g/dL (ref 31.5–36.0)
MCV: 96.6 fL (ref 79.5–101.0)
MONO#: 0.6 10*3/uL (ref 0.1–0.9)
MONO%: 7.9 % (ref 0.0–14.0)
NEUT#: 5.8 10*3/uL (ref 1.5–6.5)
NEUT%: 78.2 % — AB (ref 38.4–76.8)
Platelets: 104 10*3/uL — ABNORMAL LOW (ref 145–400)
RBC: 2.91 10*6/uL — ABNORMAL LOW (ref 3.70–5.45)
RDW: 22.5 % — AB (ref 11.2–14.5)
WBC: 7.4 10*3/uL (ref 3.9–10.3)

## 2017-09-26 LAB — COMPREHENSIVE METABOLIC PANEL
ALBUMIN: 2.4 g/dL — AB (ref 3.5–5.0)
ALT: <6 U/L (ref 0–55)
ANION GAP: 9 meq/L (ref 3–11)
AST: 15 U/L (ref 5–34)
Alkaline Phosphatase: 77 U/L (ref 40–150)
BILIRUBIN TOTAL: 0.37 mg/dL (ref 0.20–1.20)
BUN: 64.9 mg/dL — AB (ref 7.0–26.0)
CALCIUM: 9.1 mg/dL (ref 8.4–10.4)
CHLORIDE: 102 meq/L (ref 98–109)
CO2: 20 mEq/L — ABNORMAL LOW (ref 22–29)
Creatinine: 1.5 mg/dL — ABNORMAL HIGH (ref 0.6–1.1)
EGFR: 33 mL/min/{1.73_m2} — AB (ref >90)
Glucose: 101 mg/dl (ref 70–140)
POTASSIUM: 5.2 meq/L — AB (ref 3.5–5.1)
Sodium: 131 mEq/L — ABNORMAL LOW (ref 136–145)
Total Protein: 5.6 g/dL — ABNORMAL LOW (ref 6.4–8.3)

## 2017-09-27 ENCOUNTER — Telehealth: Payer: Self-pay | Admitting: Oncology

## 2017-09-27 ENCOUNTER — Ambulatory Visit (HOSPITAL_BASED_OUTPATIENT_CLINIC_OR_DEPARTMENT_OTHER): Payer: Medicare Other | Admitting: Oncology

## 2017-09-27 VITALS — BP 110/67 | HR 94 | Resp 17 | Ht 59.0 in | Wt 102.7 lb

## 2017-09-27 DIAGNOSIS — G629 Polyneuropathy, unspecified: Secondary | ICD-10-CM

## 2017-09-27 DIAGNOSIS — C787 Secondary malignant neoplasm of liver and intrahepatic bile duct: Secondary | ICD-10-CM

## 2017-09-27 DIAGNOSIS — R531 Weakness: Secondary | ICD-10-CM | POA: Diagnosis not present

## 2017-09-27 DIAGNOSIS — R634 Abnormal weight loss: Secondary | ICD-10-CM | POA: Diagnosis not present

## 2017-09-27 DIAGNOSIS — C772 Secondary and unspecified malignant neoplasm of intra-abdominal lymph nodes: Secondary | ICD-10-CM

## 2017-09-27 DIAGNOSIS — C569 Malignant neoplasm of unspecified ovary: Secondary | ICD-10-CM | POA: Diagnosis not present

## 2017-09-27 DIAGNOSIS — R0602 Shortness of breath: Secondary | ICD-10-CM | POA: Diagnosis not present

## 2017-09-27 LAB — CA 125

## 2017-09-27 NOTE — Progress Notes (Signed)
ID: Cassandra Nichols   DOB: May 25, 1941  MR#: [id number]  MPN#:[id number]   PCP:  Patient, No Pcp Per GYN:  [name], MD OTHER:  [name], MD  CHIEF COMPLAINT:  Recurrent Ovarian Cancer  CURRENT TREATMENT: olaparib  NOTE: THIS PATIENT REQUESTS THAT ABSOLUTELY NO NUMERIC INFORMATION BE GIVEN REGARDING ANY LAB AND PARTICULARLY DOES NOT WANT TO KNOW SPECIFIC CA-125 RESULTS  INTERVAL HISTORY: Cassandra Nichols returns today for follow-up and treatment of her metastatic ovarian cancer accompanied by her two children, son Cassandra Nichols and daughter Cassandra Nichols.    She continues on olaparib. 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 She has significant fatigue associated with it. Pt reports not taking it the last two days, because she doesn't think it is working anymore.  REVIEW OF SYSTEMS: Cassandra Nichols reports in the last 7-10 days an overall decrease in her normal activity and basic functionality. She has become very weak and endorses decreased appetite and increased shortness of breath. Pt has recently started drinking boost. She notes not drinking enough throughout the day, and is having a hard time staying hydrated. Besides the boost, she will also sip on water. Pt is able to tolerate precooked bacon, hard boiled eggs, toast, and yogurt. After her last procedure, which drained 4L of fluid, she found herself with more of an appetite. Reports eating taco bell, which did not sit well with her stomach, causing her to have an episode of emesis that contained bile. Pt states that her bowel movements have been soft (not diarrhea), since taking a stool softener daily as well as the generic of MiraLAX. Her last BM was 2 days ago. She hasn't had her port flushed recently and would like to have it done. She denies unusual headaches, visual changes, nausea, vomiting, or dizziness. There has been no unusual cough, phlegm production, or pleurisy. This been no change in bowel or bladder habits. She denies pain, bleeding,  rash, or fever. A detailed review of systems was otherwise negative.     HISTORY OF PRESENT ILLNESS: From the earlier summary note:   Cassandra Nichols was initially diagnosed with ovarian cancer in July of 2002. She underwent debulking surgery at that time for what proved to be a 4 of 18 lymph nodes positive disease. She was initially treated with 6 cycles of paclitaxel and carboplatin adjuvantly, completed in December of 2002.  Patient has had multiple recurrences since December of 2002, and has been treated with multiple agents, as detailed below.   PAST MEDICAL HISTORY: Past Medical History:  Diagnosis Date  . Cancer ([place])   . 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 Ovarian cancer ([place]) 11/17/2011  . Shingles     PAST SURGICAL HISTORY: Past Surgical History:  Procedure Laterality Date  . GANGLION CYST EXCISION Left    Wrist  . IR PARACENTESIS  08/05/2017  . IR PARACENTESIS  08/17/2017    FAMILY HISTORY Family History  Problem Relation Age of Onset  . Hypertension Mother   . GER disease Mother   . Cancer Father        Cancer (stomach)   the patient's father died at the age of 57, from cancer that may have it started in his abdomen. He had 8 siblings including 2 sisters with breast cancer. The patient's mother had by Dr. cancer, but died from unrelated causes at the age of 29. The patient's maternal grandmother may have had ovarian cancer. The patient is an only child   GYNECOLOGIC  HISTORY: Menarche age 70, first live birth age 60. The patient is GX P1. She went through the change of life around age 86. She subsequently had a TAH/BSO was part of her ovarian cancer treatment.  SOCIAL HISTORY: (Updated 03/08/2014) Cassandra Nichols lives alone. She works temporary jobs, primarily Nutritional therapist.  Her (adopted) daughter Cassandra Nichols lives [place] and is Mudlogger of the epidemiology and [place] there; she is married with 2 children. Son  Cassandra Nichols lives in [place], [place]. He has 4 children of his  own. He is Tree surgeon for a [employer]. His wife is a Marine scientist.    ADVANCED DIRECTIVES: At the 01/16/2016 visit [name] tells me that her son Cassandra Nichols is her healthcare power of attorney. He can be reached at [phone]. [Her daughter Cassandra Nichols is her financial power of attorney.]  HEALTH MAINTENANCE:  (Updated  03/08/2014) Social History  Substance Use Topics  . Smoking status: Former Research scientist (life sciences)  . Smokeless tobacco: Never Used  . Alcohol use No     Comment: Occasionally     Colonoscopy:   PAP: s/p hysterectomy BSO  Bone density:   Mammogram:  09/16/2015   Allergies  Allergen Reactions  . Penicillins Rash    Current Outpatient Prescriptions  Medication Sig Dispense Refill  . dexamethasone (DECADRON) 4 MG tablet Take 2 times a day with food the day before chemo, the day of chemo, and the day after chemo 30 tablet 4  . docusate sodium (COLACE) 100 MG capsule Take 100 mg by mouth 2 (two) times daily.    . fluticasone (FLONASE) 50 MCG/ACT nasal spray Place 1 spray into both nostrils as needed for allergies or rhinitis.    . folic acid (FOLVITE) 1 MG tablet Take 1 tablet (1 mg total) by mouth daily. 90 tablet 4  . hydrochlorothiazide (MICROZIDE) 12.5 MG capsule Take 2 capsules (25 mg total) by mouth daily. 90 capsule 3  . lisinopril (PRINIVIL,ZESTRIL) 20 MG tablet Take 1 tablet (20 mg total) by mouth daily. 90 tablet 1  . loratadine (CLARITIN) 10 MG tablet Take 10 mg by mouth daily as needed for allergies.    . Melatonin 3 MG TABS Take 3 mg by mouth at bedtime as needed. Reported on 03/12/2016    . olaparib (LYNPARZA) 100 MG TABS Take 200 mg by mouth 2 (two) times daily. 120 tablet 6  . prochlorperazine (COMPAZINE) 10 MG tablet Take 1 tablet (10 mg total) by mouth every 6 (six) hours as needed for nausea or vomiting. 120 tablet 3  . vitamin B-12 (CYANOCOBALAMIN) 1000 MCG tablet Take 1 tablet (1,000 mcg total) by mouth daily. 90 tablet 4   No current facility-administered medications  for this visit.     OBJECTIVE: Middle-aged white woman Examined in a wheelchair  Vitals:   09/27/17 1446  BP: 110/67  Pulse: 94  Resp: 17     Body mass index is 20.74 kg/m.    ECOG FS: 2 Filed Weights   09/27/17 1446  Weight: 102 lb 11.2 oz (46.6 kg)    Sclerae unicteric, EOMs intact Oropharynx clear and moist No cervical or supraclavicular adenopathy Lungs no rales or rhonchi Heart regular rate and rhythm Abd soft, mildly distended, nontender, positive bowel sounds MSK no focal spinal tenderness, no upper extremity lymphedema Neuro: nonfocal, well oriented, appropriate affect Breasts: Deferred   LAB RESULTS:   Lab Results  Component Value Date   WBC 7.4 09/26/2017   NEUTROABS 5.8 09/26/2017   HGB 9.6 (L)  09/26/2017   HCT 28.1 (L) 09/26/2017   MCV 96.6 09/26/2017   PLT 104 (L) 09/26/2017        Chemistry      Component Value Date/Time   NA 131 (L) 09/26/2017 1232   K 5.2 (H) 09/26/2017 1232   CL 106 09/29/2016 1343   CL 106 05/09/2013 1304   CO2 20 (L) 09/26/2017 1232   BUN 64.9 (H) 09/26/2017 1232   CREATININE 1.5 (H) 09/26/2017 1232      Component Value Date/Time   CALCIUM 9.1 09/26/2017 1232   ALKPHOS 77 09/26/2017 1232   AST 15 09/26/2017 1232   ALT <6 09/26/2017 1232   BILITOT 0.37 09/26/2017 1232      STUDIES: CA-125 remains in the 10,000 range, but stable  ASSESSMENT: 76 y.o.  [place] woman (1)  with a history of ovarian cancer dating back to July 2002 when she underwent debulking surgery for what proved to be 4/18 lymph node positive disease.   (2)  She received 6 cycles of paclitaxel/carboplatin adjuvantly completed in December 2002.   (3)  She had her first recurrence in December 2005 treated with paclitaxel;   (4)  second recurrence May 2007 treated with [place] and paclitaxel;   (5)  third recurrence January 2010 treated with topotecan and Gemzar;   (6)  fourth recurrence January 2012 treated with single-agent carboplatin  (January to August 2012) with initial response, then plateau; with progression January 2013 (less than 6 months after last carbo dose) treated with single-agent gemzar given Q2 weeks (January to April 2013), with evidence of progression  (7) Doxil strartedJuly 2013 6, with evidence of initial response, and fair tolerance. Held between May and August 2014 due to patient's work schedule and scheduling issues.  Resumed treatment as of 08/24/2013, with some delays secondary to the patient's schedule. As of early December 2014, her total cumulative dose of Doxil reached 560 mg per meter square. In addition there was also an upward trend of the CA 125. Doxil was discontinued (last dose 10/30/2013). Echocardiogram on 11/14/2013 showed a well preserved ejection fraction of 55-60%.  (8)  treated with single agent paclitaxel, given on days one and 8 of each 21 day cycle, first dose on 12/27/2013,with continuing response, but mild to moderate toxicity; 11th and last dose 04/12/2014   (9) carboplatin and cyclophosphamide started on 08/28/14 given every 21 days-- stopped 10/10/2014 after a reaction to carboplatin (also no evidence of response)  (10) cyclophosphamide and bevacizumab started 12/02/2014, stopped 12/26/2015 with progression  (a) CT scan of the abdomen and pelvis 06/19/2015 shows stable disease  (b) CT of the chest/abd/pelvis 09/12/2015 stable  (c) PET scan 01/15/2016 shows progression in the liver and upper abdominal nodes  (11) abraxane started 01/16/2016,  repeated every 14 days with evidence of response  (a) last dose 06/25/2016, discontinued because of neuropathy  (12) offered genetics testing at the 01/16/2016 visit, but declined  (13) started pemetrexed 08/27/2016  (a) cycle 2 delayed 2 weeks because of the patient's travel schedule   (b) stopped after cycle 5, given 12/03/2016, because of a continuing rise in the CA-125  (14) started topotecan 12/24/2016 given every 14 days with  bevacizumab given every 14 days  (a) third dose (cycle 2 dose 1) omitted because of patient's travel  (b) fourth dose delayed 2 weeks for similar reasons  (c) discontinued after 04/08/2017 dose with evidence of progression  (15) started olaparib May 2018, discontinued 09/25/2017 with neuropathy and clinical disease progression  (  16) developed significant ascites 07/15/2017  (a) status post paracentesis 07/15/2017, 4.1 L serous fluid, cytology positive  (b) status post multiple repeated paracenteses, most recent 09/20/2017    PLAN:  I spent approximately 50 minutes with 2 Jane and HER-2 children today reviewing her situation in detail. Reianna Batdorf is 16 years out from her initial diagnosis of ovarian cancer, and 13 years out from her first recurrence. She has received every treatment that I know to give, including some repeated once, and most recently olaparib. While the olaparib did bring down her CA 125 some, it is causing her significant problems including neuropathy and clinically her condition has deteriorated: She has lost weight, she has no appetite, she is very weak and needs now significant help with activities of daily living.  She stopped the olaparib 2 days ago on her own initiative and I concur with that decision. She understands I have no further treatment to offer other than comfort and supportive care  I discussed her situation openly with HER-2 children. They understand in my opinion her proptosis is short, in the range of 2-4 months. We can do some things to help her functional status such as starting her on steroids for example but she is going to need significant help and is not going to be able to live on her own.  Her children understand this. They are trying to decide whether she will go to [facility] [address] [place], which has no stairs, but is far away, or [address] [place], which is closer but which has significant stairs.  The understand that in case of a terminal event she should  not be and would not be resuscitated. She is a candidate for hospice and they agree that a hospice referral should be placed now, with transferred to the hospital of which ever community she ends up going to  Cassandra Nichols participated in this decision and is fully in agreement with this plan.  Tentatively I have made her a return appointment in 2 weeks, but I suspect her children will make a definitive decision regarding where she will live and she will be moved there before the time of that visit  They know to call me for any issues that may develop and they have both myself and number and my beeper number         ADDENDUM: (From earlier note) Chiyoko Torrico brought as copies of her advanced directives documents, which she completed and notarized. As healthcare power of attorney she underwent her son Jalynne Persico and his he is not reasonably available we would contact her Daughter Jefferey Pica. In her living will she indicated that if her attending physician determines that she has an incurable or enters reversible condition that will result in her death in a short time or she becomes unconscious and if she is not expected to regain consciousness or she suffers from advanced dementia, then life-prolonging measures may be withheld. She crossed out artificial nutrition or hydration. She wishes to be as comfortable as possible. And she notes that her healthcare agent has authority to override this advance directive.   Chauncey Cruel, MD 09/27/17 3:17 PM Medical Oncology and Hematology [facility] [address] [place], [place] [address] Tel. [phone] Fax. [phone]  This document serves as a record of services personally performed by Lurline Del, MD. It was created on her behalf by Margit Banda, a trained medical scribe. The creation of this record is based on the scribe's personal observations  and the provider's statements to them. This document has been  checked and approved by the attending provider.

## 2017-09-27 NOTE — Telephone Encounter (Signed)
Gave patient AVS and calendar of upcoming October appointments °

## 2017-09-28 ENCOUNTER — Other Ambulatory Visit: Payer: Self-pay | Admitting: Oncology

## 2017-09-28 ENCOUNTER — Telehealth: Payer: Self-pay | Admitting: *Deleted

## 2017-09-28 MED ORDER — PREDNISONE 10 MG PO TABS: 10.0000 mg | ORAL_TABLET | Freq: Every day | ORAL | 3 refills | Status: AC

## 2017-09-28 NOTE — Telephone Encounter (Signed)
Referral for Palliative Care placed with [facility].

## 2017-09-28 NOTE — Telephone Encounter (Signed)
This RN contacted [facility] and placed referral.

## 2017-09-30 ENCOUNTER — Other Ambulatory Visit: Payer: Self-pay

## 2017-09-30 ENCOUNTER — Other Ambulatory Visit: Payer: Medicare Other

## 2017-09-30 ENCOUNTER — Ambulatory Visit: Payer: Medicare Other | Admitting: Oncology

## 2017-09-30 DIAGNOSIS — C569 Malignant neoplasm of unspecified ovary: Secondary | ICD-10-CM

## 2017-10-02 ENCOUNTER — Other Ambulatory Visit: Payer: Self-pay | Admitting: Radiology

## 2017-10-04 ENCOUNTER — Ambulatory Visit (HOSPITAL_COMMUNITY)
Admission: RE | Admit: 2017-10-04 | Discharge: 2017-10-04 | Disposition: A | Discharge status: Home / Self Care | Payer: Medicare Other | Source: Ambulatory Visit | Attending: Oncology | Admitting: Oncology

## 2017-10-04 ENCOUNTER — Other Ambulatory Visit: Payer: Self-pay | Admitting: Student

## 2017-10-04 ENCOUNTER — Ambulatory Visit: Payer: Medicare Other | Admitting: Oncology

## 2017-10-04 ENCOUNTER — Encounter (HOSPITAL_COMMUNITY): Payer: Self-pay

## 2017-10-04 ENCOUNTER — Other Ambulatory Visit: Payer: Self-pay | Admitting: Oncology

## 2017-10-04 DIAGNOSIS — C569 Malignant neoplasm of unspecified ovary: Secondary | ICD-10-CM | POA: Insufficient documentation

## 2017-10-04 DIAGNOSIS — Z7951 Long term (current) use of inhaled steroids: Secondary | ICD-10-CM | POA: Diagnosis not present

## 2017-10-04 DIAGNOSIS — Z7952 Long term (current) use of systemic steroids: Secondary | ICD-10-CM | POA: Insufficient documentation

## 2017-10-04 DIAGNOSIS — I158 Other secondary hypertension: Secondary | ICD-10-CM | POA: Diagnosis not present

## 2017-10-04 DIAGNOSIS — R180 Malignant ascites: Secondary | ICD-10-CM | POA: Insufficient documentation

## 2017-10-04 DIAGNOSIS — C786 Secondary malignant neoplasm of retroperitoneum and peritoneum: Secondary | ICD-10-CM | POA: Insufficient documentation

## 2017-10-04 DIAGNOSIS — Z880 Allergy status to penicillin: Secondary | ICD-10-CM | POA: Insufficient documentation

## 2017-10-04 HISTORY — PX: IR PERC TUN PERIT CATH WO PORT S&I /IMAG: IMG2327

## 2017-10-04 LAB — CBC WITH DIFFERENTIAL/PLATELET
BASOS ABS: 0 10*3/uL (ref 0.0–0.1)
Basophils Relative: 0 %
EOS ABS: 0 10*3/uL (ref 0.0–0.7)
Eosinophils Relative: 0 %
HCT: 29 % — ABNORMAL LOW (ref 36.0–46.0)
Hemoglobin: 9.9 g/dL — ABNORMAL LOW (ref 12.0–15.0)
LYMPHS ABS: 1.3 10*3/uL (ref 0.7–4.0)
Lymphocytes Relative: 24 %
MCH: 33.8 pg (ref 26.0–34.0)
MCHC: 34.1 g/dL (ref 30.0–36.0)
MCV: 99 fL (ref 78.0–100.0)
MONO ABS: 0.6 10*3/uL (ref 0.1–1.0)
Monocytes Relative: 11 %
Neutro Abs: 3.5 10*3/uL (ref 1.7–7.7)
Neutrophils Relative %: 65 %
PLATELETS: 137 10*3/uL — AB (ref 150–400)
RBC: 2.93 MIL/uL — AB (ref 3.87–5.11)
RDW: 21.9 % — AB (ref 11.5–15.5)
WBC: 5.4 10*3/uL (ref 4.0–10.5)

## 2017-10-04 LAB — COMPREHENSIVE METABOLIC PANEL
ALBUMIN: 2.8 g/dL — AB (ref 3.5–5.0)
ALT: 13 U/L — ABNORMAL LOW (ref 14–54)
ANION GAP: 11 (ref 5–15)
AST: 23 U/L (ref 15–41)
Alkaline Phosphatase: 87 U/L (ref 38–126)
BUN: 44 mg/dL — ABNORMAL HIGH (ref 6–20)
CHLORIDE: 101 mmol/L (ref 101–111)
CO2: 20 mmol/L — AB (ref 22–32)
CREATININE: 1.12 mg/dL — AB (ref 0.44–1.00)
Calcium: 8.7 mg/dL — ABNORMAL LOW (ref 8.9–10.3)
GFR, EST AFRICAN AMERICAN: 54 mL/min — AB (ref >60)
GFR, EST NON AFRICAN AMERICAN: 47 mL/min — AB (ref >60)
GLUCOSE: 90 mg/dL (ref 65–99)
Potassium: 4.3 mmol/L (ref 3.5–5.1)
SODIUM: 132 mmol/L — AB (ref 135–145)
Total Bilirubin: 0.3 mg/dL (ref 0.3–1.2)
Total Protein: 5.7 g/dL — ABNORMAL LOW (ref 6.5–8.1)

## 2017-10-04 LAB — PROTIME-INR
INR: 0.97
Prothrombin Time: 12.8 seconds (ref 11.4–15.2)

## 2017-10-04 MED ORDER — SODIUM CHLORIDE 0.9 % IV SOLN
INTRAVENOUS | Status: DC
  Administered 2017-10-04: 08:00:00 via INTRAVENOUS

## 2017-10-04 MED ORDER — CLINDAMYCIN PHOSPHATE 900 MG/50ML IV SOLN
INTRAVENOUS | Status: AC
  Filled 2017-10-04: qty 50

## 2017-10-04 MED ORDER — FENTANYL CITRATE (PF) 100 MCG/2ML IJ SOLN
INTRAMUSCULAR | Status: AC
  Filled 2017-10-04: qty 4

## 2017-10-04 MED ORDER — HYDROCODONE-ACETAMINOPHEN 5-325 MG PO TABS: 1.0000 | ORAL_TABLET | ORAL | Status: DC | PRN

## 2017-10-04 MED ORDER — MIDAZOLAM HCL 2 MG/2ML IJ SOLN
INTRAMUSCULAR | Status: AC | PRN
  Administered 2017-10-04: 1 mg via INTRAVENOUS

## 2017-10-04 MED ORDER — MIDAZOLAM HCL 2 MG/2ML IJ SOLN
INTRAMUSCULAR | Status: AC
  Filled 2017-10-04: qty 4

## 2017-10-04 MED ORDER — FENTANYL CITRATE (PF) 100 MCG/2ML IJ SOLN
INTRAMUSCULAR | Status: AC | PRN
  Administered 2017-10-04: 50 ug via INTRAVENOUS

## 2017-10-04 MED ORDER — LIDOCAINE HCL 1 % IJ SOLN
INTRAMUSCULAR | Status: AC | PRN
  Administered 2017-10-04: 15 mL

## 2017-10-04 MED ORDER — CLINDAMYCIN PHOSPHATE 900 MG/50ML IV SOLN
900.0000 mg | Freq: Once | INTRAVENOUS | Status: AC
  Administered 2017-10-04: 900 mg via INTRAVENOUS

## 2017-10-04 NOTE — Discharge Instructions (Addendum)
Moderate Conscious Sedation, Adult, Care After These instructions provide you with information about caring for yourself after your procedure. Your health care provider may also give you more specific instructions. Your treatment has been planned according to current medical practices, but problems sometimes occur. Call your health care provider if you have any problems or questions after your procedure. What can I expect after the procedure? After your procedure, it is common:  To feel sleepy for several hours.  To feel clumsy and have poor balance for several hours.  To have poor judgment for several hours.  To vomit if you eat too soon.  Follow these instructions at home: For at least 24 hours after the procedure:   Do not: ? Participate in activities where you could fall or become injured. ? Drive. ? Use heavy machinery. ? Drink alcohol. ? Take sleeping pills or medicines that cause drowsiness. ? Make important decisions or sign legal documents. ? Take care of children on your own.  Rest. Eating and drinking  Follow the diet recommended by your health care provider.  If you vomit: ? Drink water, juice, or soup when you can drink without vomiting. ? Make sure you have little or no nausea before eating solid foods. General instructions  Have a responsible adult stay with you until you are awake and alert.  Take over-the-counter and prescription medicines only as told by your health care provider.  If you smoke, do not smoke without supervision.  Keep all follow-up visits as told by your health care provider. This is important. Contact a health care provider if:  You keep feeling nauseous or you keep vomiting.  You feel light-headed.  You develop a rash.  You have a fever. Get help right away if:  You have trouble breathing. This information is not intended to replace advice given to you by your health care provider. Make sure you discuss any questions you have  with your health care provider. Document Released: 09/26/2013 Document Revised: 05/10/2016 Document Reviewed: 03/27/2016 Elsevier Interactive Patient Education  2018 [place].   Ascites/Pleurex Drainage Catheter Placement, Care After Refer to this sheet in the next few weeks. These instructions provide you with information about caring for yourself after your procedure. Your health care provider may also give you more specific instructions. Your treatment has been planned according to current medical practices, but problems sometimes occur. Call your health care provider if you have any problems or questions after your procedure. What can I expect after the procedure? After the procedure, it is common to have:  Pain, soreness, and bruising near the area where the catheter was placed.  Drowsiness. This may last for several hours.  Trouble remembering things. The medicine that was given to help you relax during the procedure (sedative) may take several hours to fully wear off.  Follow these instructions at home:  Rest, but do not spend the whole day in bed. Walk around to stay active.  Weigh yourself every day. A change in weight may mean that too much fluid is building up in your body. Ask how much change in your weight would give you a reason to call or visit your health care provider.  Take medicines only as directed by your health care provider.  Keep all follow-up visits as directed by your health care provider. This is important.  If you are asked to drain fluid at home, follow instructions from your health care provider about how to do this.  Follow any dietary instructions  from your health care provider. You may need to: ? Limit foods and liquids that increase or decrease how much fluid your body holds onto. ? Limit how much salt (sodium) you eat. ? Eat a high-protein diet. ? Avoid alcohol.  Follow your health care providers instructions about:  Please notify Hospice of  drain placement and follow up with Hospice Services for drainage catheter care. ? Incision care. ? Bandage (dressing) changes and removal. ? Incision closure removal. ? Please do not shower until after first Hospice visit.  Check your incision area every day for signs of infection. Watch for: ? Redness, swelling, or pain. ? Fluid, blood, or pus. Contact a health care provider if:  You have skin irritation, redness, or pain where the catheter was inserted (insertion site).  You have trouble draining fluid.  You have trouble caring for your catheter.  You have abdominal pain, bloating, or cramping. Get help right away if:  You develop a fever or chills.  Difficulty breathing  You have increasing redness or increasing pain around the insertion site.  You have blood or pus coming from the insertion site.  Fluid that drains out of the catheter becomes cloudy or has a bad smell.  You have abdominal pain or cramping that worsens or does not go away. This information is not intended to replace advice given to you by your health care provider. Make sure you discuss any questions you have with your health care provider. Document Released: 11/25/2011 Document Revised: 08/12/2016 Document Reviewed: 12/02/2014 Elsevier Interactive Patient Education  [employer].

## 2017-10-04 NOTE — Procedures (Signed)
Interventional Radiology Procedure Note  Procedure: Placement of a tunneled peritoneal drainage catheter.   Complications: None.  Estimated Blood Loss: <25 mL  Recommendations: - Bedrest x 1 hr - DC home  Signed,  Criselda Peaches, MD

## 2017-10-04 NOTE — H&P (Signed)
Referring Physician(s): Chauncey Cruel  Supervising Physician: Jacqulynn Cadet  Patient Status:  [facility]  Chief Complaint:  "I'm here to get a tube in my abdomen"  Subjective: Patient familiar to IR service from prior Port-A-Cath placement  in 2010 as well as multiple paracenteses, last on 09/20/17 yielding 4 liters . 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 She presents today for tunneled peritoneal catheter placement to assist with ascites removal. She currently denies fever, headache, chest pain, cough, worsening abdominal pain, nausea, vomiting or bleeding. She has had weight loss, dyspnea with exertion,abdominal distention, intermittent back pain, and weakness.  Past Medical History:  Diagnosis Date  . Cancer ([place])   . 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 Ovarian cancer ([place]) 11/17/2011  . Shingles    Past Surgical History:  Procedure Laterality Date  . GANGLION CYST EXCISION Left    Wrist  . IR PARACENTESIS  08/05/2017  . IR PARACENTESIS  08/17/2017    Allergies: Penicillins  Medications: Prior to Admission medications   Medication Sig Start Date End Date Taking? Authorizing Provider  docusate sodium (COLACE) 100 MG capsule Take 100 mg by mouth 2 (two) times daily.   Yes [provider]  fluticasone (FLONASE) 50 MCG/ACT nasal spray Place 1 spray into both nostrils as needed for allergies or rhinitis.   Yes [provider]  lisinopril (PRINIVIL,ZESTRIL) 20 MG tablet Take 1 tablet (20 mg total) by mouth daily. 08/24/17  Yes Magrinat, Virgie Dad, MD  loratadine (CLARITIN) 10 MG tablet Take 10 mg by mouth daily as needed for allergies.   Yes [provider]  Melatonin 3 MG TABS Take 3 mg by mouth at bedtime as needed. Reported on 03/12/2016   Yes [provider]  olaparib (LYNPARZA) 100 MG TABS Take 200 mg by mouth 2 (two) times daily.  05/03/17  Yes Magrinat, Virgie Dad, MD  omeprazole (PRILOSEC) 20 MG capsule Take 20 mg by mouth as needed.   Yes [provider]  polyethylene glycol (MIRALAX / GLYCOLAX) packet Take 17 g by mouth as needed.   Yes [provider]  prochlorperazine (COMPAZINE) 10 MG tablet Take 1 tablet (10 mg total) by mouth every 6 (six) hours as needed for nausea or vomiting. 08/24/17  Yes Magrinat, Virgie Dad, MD  dexamethasone (DECADRON) 4 MG tablet Take 2 times a day with food the day before chemo, the day of chemo, and the day after chemo 08/13/16   Magrinat, Virgie Dad, MD  folic acid (FOLVITE) 1 MG tablet Take 1 tablet (1 mg total) by mouth daily. 08/13/16   Magrinat, Virgie Dad, MD  hydrochlorothiazide (MICROZIDE) 12.5 MG capsule Take 2 capsules (25 mg total) by mouth daily. 08/25/17   Magrinat, Virgie Dad, MD  predniSONE (DELTASONE) 10 MG tablet Take 1 tablet (10 mg total) by mouth daily with breakfast. 09/28/17   Magrinat, Virgie Dad, MD  vitamin B-12 (CYANOCOBALAMIN) 1000 MCG tablet Take 1 tablet (1,000 mcg total) by mouth daily. 08/13/16   Magrinat, Virgie Dad, MD     Vital Signs: BP 116/72 (BP Location: Right Arm)   Pulse 89   Temp 97.6 F (36.4 C) (Oral)   Resp 16   Ht 5\' 1"  (1.549 m)   Wt 102 lb (46.3 kg)   SpO2 100%   BMI 19.27 kg/m   Physical Exam awake, alert. Chest clear to auscultation bilaterally. Heart with regular rate and  rhythm. Abdomen distended, positive bowel sounds, nontender. 1-2+ bilateral lower extremity edema.  Imaging: No results found.  Labs:  CBC:  Recent Labs  08/12/17 1104 09/02/17 1059 09/26/17 1232 10/04/17 0730  WBC 5.9 5.3 7.4 5.4  HGB 11.4* 9.8* 9.6* 9.9*  HCT 34.6* 29.3* 28.1* 29.0*  PLT 163 124* 104* 137*    COAGS:  Recent Labs  10/04/17 0730  INR 0.97    BMP:  Recent Labs  08/12/17 1104 09/02/17 1100 09/26/17 1232 10/04/17 0730  NA 132* 129* 131* 132*  K 4.9 4.7 5.2* 4.3  CL  --   --   --  101  CO2 24 20* 20* 20*  GLUCOSE 109  107 101 90  BUN 21.4 37.3* 64.9* 44*  CALCIUM 9.8 9.2 9.1 8.7*  CREATININE 1.2* 1.7* 1.5* 1.12*  GFRNONAA  --   --   --  47*  GFRAA  --   --   --  54*    LIVER FUNCTION TESTS:  Recent Labs  08/12/17 1104 09/02/17 1100 09/26/17 1232 10/04/17 0730  BILITOT 0.51 0.39 0.37 0.3  AST 17 18 15 23   ALT 7 6 <6 13*  ALKPHOS 74 84 77 87  PROT 6.4 6.1* 5.6* 5.7*  ALBUMIN 2.6* 2.5* 2.4* 2.8*    Assessment and Plan: Patient with 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 She presents today for tunneled peritoneal catheter placement to assist with ascites removal. Details/risks of procedure, including but not limited to, internal bleeding, infection, injury to adjacent structures, inability to place drain due to minimal ascites discussed with patient with her understanding and consent.   Electronically Signed: D. Rowe Robert, PA-C 10/04/2017, 8:39 AM   I spent a total of 25 minutes at the the patient's bedside AND on the patient's hospital floor or unit, greater than 50% of which was counseling/coordinating care for tunneled peritoneal catheter placement

## 2017-10-06 ENCOUNTER — Telehealth: Payer: Self-pay

## 2017-10-06 NOTE — Telephone Encounter (Signed)
Lvm with Lindsey,RN for hospice giving her phone number to [facility] to order pleurex drainage supplies for pt.  Order and info was faxed to [facility] on 09/30/17

## 2017-10-06 NOTE — Telephone Encounter (Signed)
lvm for Lindsey,RN case manager for Hospice giving her a verbal order to drain pleurex weekly and as needed.

## 2017-10-11 ENCOUNTER — Ambulatory Visit: Payer: Medicare Other | Admitting: Oncology

## 2017-10-12 NOTE — Progress Notes (Signed)
No show

## 2017-10-13 ENCOUNTER — Telehealth: Payer: Self-pay

## 2017-10-13 NOTE — Telephone Encounter (Signed)
Abigail Butts of hospice [place] called to let Dr Jana Hakim know pt dc'd from hospice [place] because she changed to [facility] in [place].

## 2017-11-19 DEATH — deceased

## 2018-02-13 NOTE — Progress Notes (Signed)
This encounter was created in error - please disregard.

## 2020-06-30 NOTE — Progress Notes (Signed)
error
